# Patient Record
Sex: Male | Born: 1935
Health system: Southern US, Community
[De-identification: ages and names within clinical notes are randomized; demographics above are authoritative.]

## PROBLEM LIST (undated history)

## (undated) DIAGNOSIS — D126 Benign neoplasm of colon, unspecified: Secondary | ICD-10-CM

## (undated) DIAGNOSIS — G4700 Insomnia, unspecified: Secondary | ICD-10-CM

## (undated) DIAGNOSIS — I2510 Atherosclerotic heart disease of native coronary artery without angina pectoris: Secondary | ICD-10-CM

## (undated) DIAGNOSIS — H903 Sensorineural hearing loss, bilateral: Secondary | ICD-10-CM

## (undated) DIAGNOSIS — K5900 Constipation, unspecified: Secondary | ICD-10-CM

## (undated) DIAGNOSIS — I6523 Occlusion and stenosis of bilateral carotid arteries: Secondary | ICD-10-CM

## (undated) DIAGNOSIS — N289 Disorder of kidney and ureter, unspecified: Secondary | ICD-10-CM

## (undated) DIAGNOSIS — N400 Enlarged prostate without lower urinary tract symptoms: Secondary | ICD-10-CM

## (undated) DIAGNOSIS — K5790 Diverticulosis of intestine, part unspecified, without perforation or abscess without bleeding: Secondary | ICD-10-CM

## (undated) DIAGNOSIS — E785 Hyperlipidemia, unspecified: Secondary | ICD-10-CM

## (undated) DIAGNOSIS — Z0000 Encounter for general adult medical examination without abnormal findings: Secondary | ICD-10-CM

## (undated) DIAGNOSIS — I10 Essential (primary) hypertension: Secondary | ICD-10-CM

## (undated) DIAGNOSIS — H409 Unspecified glaucoma: Secondary | ICD-10-CM

## (undated) DIAGNOSIS — I739 Peripheral vascular disease, unspecified: Secondary | ICD-10-CM

## (undated) DIAGNOSIS — M545 Low back pain: Secondary | ICD-10-CM

## (undated) DIAGNOSIS — R51 Headache: Secondary | ICD-10-CM

## (undated) DIAGNOSIS — R413 Other amnesia: Secondary | ICD-10-CM

## (undated) DIAGNOSIS — K222 Esophageal obstruction: Secondary | ICD-10-CM

## (undated) DIAGNOSIS — N189 Chronic kidney disease, unspecified: Secondary | ICD-10-CM

## (undated) DIAGNOSIS — R42 Dizziness and giddiness: Secondary | ICD-10-CM

## (undated) HISTORY — PX: EYE SURGERY: SHX253

## (undated) HISTORY — DX: Constipation, unspecified: K59.00

## (undated) HISTORY — DX: Diverticulosis of intestine, part unspecified, without perforation or abscess without bleeding: K57.90

## (undated) HISTORY — PX: CORONARY ARTERY BYPASS GRAFT: SHX141

## (undated) HISTORY — DX: Benign neoplasm of colon, unspecified: D12.6

## (undated) HISTORY — DX: Other amnesia: R41.3

## (undated) HISTORY — DX: Occlusion and stenosis of bilateral carotid arteries: I65.23

## (undated) HISTORY — DX: Unspecified glaucoma: H40.9

## (undated) HISTORY — DX: Low back pain: M54.5

## (undated) HISTORY — DX: Atherosclerotic heart disease of native coronary artery without angina pectoris: I25.10

## (undated) HISTORY — DX: Peripheral vascular disease, unspecified: I73.9

## (undated) HISTORY — DX: Esophageal obstruction: K22.2

## (undated) HISTORY — DX: Encounter for general adult medical examination without abnormal findings: Z00.00

## (undated) HISTORY — DX: Disorder of kidney and ureter, unspecified: N28.9

## (undated) HISTORY — DX: Essential (primary) hypertension: I10

## (undated) HISTORY — DX: Headache: R51

## (undated) HISTORY — DX: Hyperlipidemia, unspecified: E78.5

## (undated) HISTORY — DX: Chronic kidney disease, unspecified: N18.9

## (undated) HISTORY — DX: Dizziness and giddiness: R42

## (undated) HISTORY — DX: Benign prostatic hyperplasia without lower urinary tract symptoms: N40.0

## (undated) HISTORY — DX: Sensorineural hearing loss, bilateral: H90.3

## (undated) HISTORY — DX: Insomnia, unspecified: G47.00

---

## 1994-11-06 HISTORY — PX: ABDOMINAL AORTIC ANEURYSM REPAIR: SHX42

## 1994-11-06 HISTORY — PX: RENAL ARTERY STENT: SHX2321

## 1998-02-16 ENCOUNTER — Other Ambulatory Visit: Admission: RE | Admit: 1998-02-16 | Discharge: 1998-02-16 | Payer: Self-pay | Admitting: Family Medicine

## 1998-11-15 ENCOUNTER — Ambulatory Visit (HOSPITAL_COMMUNITY): Admission: RE | Admit: 1998-11-15 | Discharge: 1998-11-15 | Payer: Self-pay | Admitting: Family Medicine

## 1999-11-01 ENCOUNTER — Encounter (HOSPITAL_COMMUNITY): Admission: RE | Admit: 1999-11-01 | Discharge: 2000-01-30 | Payer: Self-pay | Admitting: Cardiology

## 2002-10-28 ENCOUNTER — Encounter: Payer: Self-pay | Admitting: Family Medicine

## 2002-10-28 ENCOUNTER — Encounter: Admission: RE | Admit: 2002-10-28 | Discharge: 2002-10-28 | Payer: Self-pay | Admitting: Family Medicine

## 2004-01-08 ENCOUNTER — Emergency Department (HOSPITAL_COMMUNITY): Admission: EM | Admit: 2004-01-08 | Discharge: 2004-01-08 | Payer: Self-pay | Admitting: Emergency Medicine

## 2004-09-26 ENCOUNTER — Encounter: Admission: RE | Admit: 2004-09-26 | Discharge: 2004-09-26 | Payer: Self-pay | Admitting: Family Medicine

## 2005-02-09 ENCOUNTER — Ambulatory Visit: Payer: Self-pay | Admitting: Internal Medicine

## 2005-02-24 ENCOUNTER — Ambulatory Visit: Payer: Self-pay

## 2005-03-21 ENCOUNTER — Encounter: Admission: RE | Admit: 2005-03-21 | Discharge: 2005-03-21 | Payer: Self-pay | Admitting: General Surgery

## 2005-03-23 ENCOUNTER — Ambulatory Visit (HOSPITAL_BASED_OUTPATIENT_CLINIC_OR_DEPARTMENT_OTHER): Admission: RE | Admit: 2005-03-23 | Discharge: 2005-03-23 | Payer: Self-pay | Admitting: General Surgery

## 2005-03-23 ENCOUNTER — Ambulatory Visit (HOSPITAL_COMMUNITY): Admission: RE | Admit: 2005-03-23 | Discharge: 2005-03-23 | Payer: Self-pay | Admitting: General Surgery

## 2005-03-23 HISTORY — PX: HERNIA REPAIR: SHX51

## 2005-05-16 ENCOUNTER — Ambulatory Visit: Payer: Self-pay | Admitting: Cardiology

## 2005-05-29 ENCOUNTER — Ambulatory Visit: Payer: Self-pay

## 2005-05-29 ENCOUNTER — Encounter: Payer: Self-pay | Admitting: Internal Medicine

## 2005-06-12 ENCOUNTER — Encounter: Payer: Self-pay | Admitting: Internal Medicine

## 2005-06-12 ENCOUNTER — Ambulatory Visit: Payer: Self-pay

## 2006-02-12 ENCOUNTER — Ambulatory Visit: Payer: Self-pay

## 2006-02-12 ENCOUNTER — Ambulatory Visit: Payer: Self-pay | Admitting: Cardiology

## 2006-07-25 ENCOUNTER — Ambulatory Visit (HOSPITAL_COMMUNITY): Admission: RE | Admit: 2006-07-25 | Discharge: 2006-07-25 | Payer: Self-pay | Admitting: Family Medicine

## 2006-08-27 ENCOUNTER — Ambulatory Visit: Payer: Self-pay | Admitting: Gastroenterology

## 2006-08-28 ENCOUNTER — Encounter (INDEPENDENT_AMBULATORY_CARE_PROVIDER_SITE_OTHER): Payer: Self-pay | Admitting: *Deleted

## 2006-08-28 ENCOUNTER — Ambulatory Visit: Payer: Self-pay | Admitting: Gastroenterology

## 2007-02-13 ENCOUNTER — Ambulatory Visit: Payer: Self-pay

## 2007-02-13 ENCOUNTER — Encounter: Payer: Self-pay | Admitting: Internal Medicine

## 2007-02-13 ENCOUNTER — Ambulatory Visit: Payer: Self-pay | Admitting: Cardiology

## 2007-02-26 ENCOUNTER — Encounter: Payer: Self-pay | Admitting: Internal Medicine

## 2007-04-23 ENCOUNTER — Encounter: Payer: Self-pay | Admitting: Internal Medicine

## 2007-04-23 ENCOUNTER — Emergency Department (HOSPITAL_COMMUNITY): Admission: EM | Admit: 2007-04-23 | Discharge: 2007-04-23 | Payer: Self-pay | Admitting: Emergency Medicine

## 2007-04-29 ENCOUNTER — Ambulatory Visit: Payer: Self-pay | Admitting: Internal Medicine

## 2007-05-16 ENCOUNTER — Ambulatory Visit: Payer: Self-pay | Admitting: Gastroenterology

## 2007-05-22 ENCOUNTER — Ambulatory Visit (HOSPITAL_COMMUNITY): Admission: RE | Admit: 2007-05-22 | Discharge: 2007-05-22 | Payer: Self-pay | Admitting: Gastroenterology

## 2007-07-19 ENCOUNTER — Ambulatory Visit: Payer: Self-pay | Admitting: Gastroenterology

## 2007-08-08 ENCOUNTER — Ambulatory Visit (HOSPITAL_COMMUNITY): Admission: RE | Admit: 2007-08-08 | Discharge: 2007-08-08 | Payer: Self-pay | Admitting: Gastroenterology

## 2007-08-19 ENCOUNTER — Ambulatory Visit (HOSPITAL_COMMUNITY): Admission: RE | Admit: 2007-08-19 | Discharge: 2007-08-19 | Payer: Self-pay | Admitting: Gastroenterology

## 2007-08-27 ENCOUNTER — Ambulatory Visit: Payer: Self-pay | Admitting: Gastroenterology

## 2007-09-23 ENCOUNTER — Ambulatory Visit: Payer: Self-pay | Admitting: Gastroenterology

## 2007-12-02 ENCOUNTER — Ambulatory Visit: Payer: Self-pay | Admitting: Gastroenterology

## 2007-12-05 ENCOUNTER — Ambulatory Visit (HOSPITAL_COMMUNITY): Admission: RE | Admit: 2007-12-05 | Discharge: 2007-12-05 | Payer: Self-pay | Admitting: Gastroenterology

## 2007-12-10 ENCOUNTER — Ambulatory Visit: Payer: Self-pay | Admitting: Gastroenterology

## 2008-03-02 ENCOUNTER — Encounter: Payer: Self-pay | Admitting: Internal Medicine

## 2008-05-29 ENCOUNTER — Encounter: Payer: Self-pay | Admitting: Internal Medicine

## 2008-05-29 ENCOUNTER — Telehealth: Payer: Self-pay | Admitting: Gastroenterology

## 2008-05-29 ENCOUNTER — Ambulatory Visit (HOSPITAL_COMMUNITY): Admission: RE | Admit: 2008-05-29 | Discharge: 2008-05-29 | Payer: Self-pay | Admitting: Internal Medicine

## 2008-05-29 DIAGNOSIS — K222 Esophageal obstruction: Secondary | ICD-10-CM | POA: Insufficient documentation

## 2008-05-29 DIAGNOSIS — R1319 Other dysphagia: Secondary | ICD-10-CM | POA: Insufficient documentation

## 2008-06-01 ENCOUNTER — Telehealth: Payer: Self-pay | Admitting: Gastroenterology

## 2008-06-02 ENCOUNTER — Ambulatory Visit: Payer: Self-pay | Admitting: Internal Medicine

## 2008-06-12 ENCOUNTER — Encounter: Payer: Self-pay | Admitting: Gastroenterology

## 2008-06-12 ENCOUNTER — Ambulatory Visit (HOSPITAL_COMMUNITY): Admission: RE | Admit: 2008-06-12 | Discharge: 2008-06-12 | Payer: Self-pay | Admitting: Gastroenterology

## 2008-08-11 ENCOUNTER — Encounter: Payer: Self-pay | Admitting: Gastroenterology

## 2008-08-11 ENCOUNTER — Telehealth: Payer: Self-pay | Admitting: Gastroenterology

## 2008-09-15 ENCOUNTER — Telehealth: Payer: Self-pay | Admitting: Gastroenterology

## 2008-09-28 ENCOUNTER — Ambulatory Visit (HOSPITAL_COMMUNITY): Admission: RE | Admit: 2008-09-28 | Discharge: 2008-09-28 | Payer: Self-pay | Admitting: Gastroenterology

## 2008-09-28 ENCOUNTER — Ambulatory Visit: Payer: Self-pay | Admitting: Gastroenterology

## 2008-11-18 ENCOUNTER — Encounter: Payer: Self-pay | Admitting: Internal Medicine

## 2008-12-04 ENCOUNTER — Ambulatory Visit: Payer: Self-pay | Admitting: Internal Medicine

## 2008-12-04 DIAGNOSIS — Z9889 Other specified postprocedural states: Secondary | ICD-10-CM

## 2008-12-04 DIAGNOSIS — N182 Chronic kidney disease, stage 2 (mild): Secondary | ICD-10-CM | POA: Insufficient documentation

## 2008-12-07 DIAGNOSIS — I2510 Atherosclerotic heart disease of native coronary artery without angina pectoris: Secondary | ICD-10-CM

## 2008-12-07 DIAGNOSIS — E782 Mixed hyperlipidemia: Secondary | ICD-10-CM

## 2008-12-07 DIAGNOSIS — I10 Essential (primary) hypertension: Secondary | ICD-10-CM | POA: Insufficient documentation

## 2009-01-06 ENCOUNTER — Telehealth: Payer: Self-pay | Admitting: Gastroenterology

## 2009-01-12 ENCOUNTER — Ambulatory Visit: Payer: Self-pay | Admitting: Gastroenterology

## 2009-01-12 ENCOUNTER — Ambulatory Visit (HOSPITAL_COMMUNITY): Admission: RE | Admit: 2009-01-12 | Discharge: 2009-01-12 | Payer: Self-pay | Admitting: Gastroenterology

## 2009-01-25 ENCOUNTER — Ambulatory Visit: Payer: Self-pay | Admitting: Internal Medicine

## 2009-01-25 LAB — CONVERTED CEMR LAB
ALT: 16 units/L (ref 0–53)
Albumin: 3.9 g/dL (ref 3.5–5.2)
Alkaline Phosphatase: 51 units/L (ref 39–117)
Basophils Absolute: 0.1 10*3/uL (ref 0.0–0.1)
Bilirubin, Direct: 0.2 mg/dL (ref 0.0–0.3)
Calcium: 9.5 mg/dL (ref 8.4–10.5)
Glucose, Bld: 114 mg/dL — ABNORMAL HIGH (ref 70–99)
Hemoglobin: 15 g/dL (ref 13.0–17.0)
LDL Cholesterol: 84 mg/dL (ref 0–99)
MCHC: 34.3 g/dL (ref 30.0–36.0)
MCV: 90.1 fL (ref 78.0–100.0)
Platelets: 222 10*3/uL (ref 150.0–400.0)
RBC: 4.86 M/uL (ref 4.22–5.81)
RDW: 13.5 % (ref 11.5–14.6)
Sodium: 141 meq/L (ref 135–145)
TSH: 2.74 microintl units/mL (ref 0.35–5.50)
Total Bilirubin: 1 mg/dL (ref 0.3–1.2)
Total Protein: 7.7 g/dL (ref 6.0–8.3)

## 2009-02-01 ENCOUNTER — Ambulatory Visit: Payer: Self-pay | Admitting: Internal Medicine

## 2009-03-30 ENCOUNTER — Ambulatory Visit: Payer: Self-pay | Admitting: Internal Medicine

## 2009-03-30 LAB — CONVERTED CEMR LAB
Cholesterol: 146 mg/dL (ref 0–200)
HDL: 37.1 mg/dL — ABNORMAL LOW (ref >39.00)
LDL Cholesterol: 82 mg/dL (ref 0–99)
Triglycerides: 135 mg/dL (ref 0.0–149.0)
VLDL: 27 mg/dL (ref 0.0–40.0)

## 2009-04-06 ENCOUNTER — Ambulatory Visit: Payer: Self-pay | Admitting: Internal Medicine

## 2009-04-06 DIAGNOSIS — R413 Other amnesia: Secondary | ICD-10-CM

## 2009-04-06 DIAGNOSIS — R42 Dizziness and giddiness: Secondary | ICD-10-CM | POA: Insufficient documentation

## 2009-04-15 ENCOUNTER — Ambulatory Visit: Payer: Self-pay

## 2009-04-15 ENCOUNTER — Encounter: Payer: Self-pay | Admitting: Internal Medicine

## 2009-05-11 ENCOUNTER — Ambulatory Visit: Payer: Self-pay | Admitting: Cardiology

## 2009-05-11 DIAGNOSIS — I779 Disorder of arteries and arterioles, unspecified: Secondary | ICD-10-CM | POA: Insufficient documentation

## 2009-05-11 DIAGNOSIS — I739 Peripheral vascular disease, unspecified: Secondary | ICD-10-CM

## 2009-05-18 ENCOUNTER — Telehealth (INDEPENDENT_AMBULATORY_CARE_PROVIDER_SITE_OTHER): Payer: Self-pay | Admitting: Radiology

## 2009-05-19 ENCOUNTER — Ambulatory Visit: Payer: Self-pay

## 2009-05-19 ENCOUNTER — Encounter: Payer: Self-pay | Admitting: Cardiology

## 2009-06-14 ENCOUNTER — Telehealth: Payer: Self-pay | Admitting: Gastroenterology

## 2009-06-15 ENCOUNTER — Encounter: Payer: Self-pay | Admitting: Gastroenterology

## 2009-06-17 ENCOUNTER — Ambulatory Visit (HOSPITAL_COMMUNITY): Admission: RE | Admit: 2009-06-17 | Discharge: 2009-06-17 | Payer: Self-pay | Admitting: Gastroenterology

## 2009-06-24 ENCOUNTER — Ambulatory Visit (HOSPITAL_COMMUNITY): Admission: RE | Admit: 2009-06-24 | Discharge: 2009-06-24 | Payer: Self-pay | Admitting: Gastroenterology

## 2009-06-24 ENCOUNTER — Ambulatory Visit: Payer: Self-pay | Admitting: Gastroenterology

## 2009-07-01 ENCOUNTER — Ambulatory Visit: Payer: Self-pay | Admitting: Internal Medicine

## 2009-07-01 LAB — CONVERTED CEMR LAB
ALT: 17 units/L (ref 0–53)
Albumin: 4.4 g/dL (ref 3.5–5.2)
BUN: 26 mg/dL — ABNORMAL HIGH (ref 6–23)
Bilirubin, Direct: 0.1 mg/dL (ref 0.0–0.3)
Calcium: 9.6 mg/dL (ref 8.4–10.5)
Creatinine, Ser: 1.53 mg/dL — ABNORMAL HIGH (ref 0.40–1.50)
Glucose, Bld: 116 mg/dL — ABNORMAL HIGH (ref 70–99)
Potassium: 4.4 meq/L (ref 3.5–5.3)
Sodium: 139 meq/L (ref 135–145)

## 2009-07-08 ENCOUNTER — Ambulatory Visit: Payer: Self-pay | Admitting: Internal Medicine

## 2009-09-09 ENCOUNTER — Telehealth: Payer: Self-pay | Admitting: Gastroenterology

## 2009-09-14 ENCOUNTER — Encounter: Payer: Self-pay | Admitting: Gastroenterology

## 2009-09-14 ENCOUNTER — Ambulatory Visit: Payer: Self-pay | Admitting: Gastroenterology

## 2009-09-14 ENCOUNTER — Ambulatory Visit (HOSPITAL_COMMUNITY): Admission: RE | Admit: 2009-09-14 | Discharge: 2009-09-14 | Payer: Self-pay | Admitting: Gastroenterology

## 2009-09-16 ENCOUNTER — Encounter: Payer: Self-pay | Admitting: Gastroenterology

## 2009-09-20 ENCOUNTER — Telehealth: Payer: Self-pay | Admitting: Gastroenterology

## 2009-09-21 ENCOUNTER — Ambulatory Visit: Payer: Self-pay | Admitting: Gastroenterology

## 2009-09-21 DIAGNOSIS — Z860100 Personal history of colon polyps, unspecified: Secondary | ICD-10-CM | POA: Insufficient documentation

## 2009-09-21 DIAGNOSIS — R1032 Left lower quadrant pain: Secondary | ICD-10-CM

## 2009-09-22 ENCOUNTER — Ambulatory Visit (HOSPITAL_COMMUNITY): Admission: RE | Admit: 2009-09-22 | Discharge: 2009-09-22 | Payer: Self-pay | Admitting: Gastroenterology

## 2009-09-23 ENCOUNTER — Telehealth: Payer: Self-pay | Admitting: Gastroenterology

## 2009-09-27 ENCOUNTER — Encounter: Payer: Self-pay | Admitting: Internal Medicine

## 2009-09-27 ENCOUNTER — Encounter: Payer: Self-pay | Admitting: Gastroenterology

## 2009-09-27 ENCOUNTER — Ambulatory Visit (HOSPITAL_COMMUNITY): Admission: RE | Admit: 2009-09-27 | Discharge: 2009-09-27 | Payer: Self-pay | Admitting: Gastroenterology

## 2009-11-29 ENCOUNTER — Ambulatory Visit: Payer: Self-pay | Admitting: Gastroenterology

## 2009-11-30 ENCOUNTER — Ambulatory Visit: Payer: Self-pay | Admitting: Internal Medicine

## 2009-11-30 LAB — CONVERTED CEMR LAB
BUN: 36 mg/dL — ABNORMAL HIGH (ref 6–23)
Basophils Relative: 0 % (ref 0–1)
CO2: 24 meq/L (ref 19–32)
Calcium: 9.3 mg/dL (ref 8.4–10.5)
Chloride: 100 meq/L (ref 96–112)
Lymphocytes Relative: 24 % (ref 12–46)
Lymphs Abs: 2.9 10*3/uL (ref 0.7–4.0)
MCHC: 32.4 g/dL (ref 30.0–36.0)
MCV: 90.4 fL (ref 78.0–100.0)
Potassium: 4.5 meq/L (ref 3.5–5.3)
RDW: 13.7 % (ref 11.5–15.5)
WBC: 12.1 10*3/uL — ABNORMAL HIGH (ref 4.0–10.5)

## 2009-12-01 ENCOUNTER — Telehealth: Payer: Self-pay | Admitting: Internal Medicine

## 2009-12-02 ENCOUNTER — Encounter: Payer: Self-pay | Admitting: Physician Assistant

## 2009-12-07 ENCOUNTER — Ambulatory Visit: Payer: Self-pay | Admitting: Internal Medicine

## 2009-12-10 ENCOUNTER — Telehealth: Payer: Self-pay | Admitting: Cardiology

## 2009-12-15 ENCOUNTER — Ambulatory Visit: Payer: Self-pay | Admitting: Internal Medicine

## 2009-12-15 LAB — CONVERTED CEMR LAB
Calcium: 9.8 mg/dL (ref 8.4–10.5)
Chloride: 105 meq/L (ref 96–112)
Creatinine, Ser: 1.43 mg/dL (ref 0.40–1.50)
Potassium: 4.8 meq/L (ref 3.5–5.3)

## 2009-12-16 ENCOUNTER — Encounter: Payer: Self-pay | Admitting: Internal Medicine

## 2009-12-17 ENCOUNTER — Ambulatory Visit: Payer: Self-pay

## 2009-12-17 ENCOUNTER — Encounter: Payer: Self-pay | Admitting: Cardiology

## 2010-01-05 ENCOUNTER — Ambulatory Visit: Payer: Self-pay | Admitting: Internal Medicine

## 2010-01-10 ENCOUNTER — Telehealth: Payer: Self-pay | Admitting: Gastroenterology

## 2010-01-11 DIAGNOSIS — K921 Melena: Secondary | ICD-10-CM

## 2010-01-18 ENCOUNTER — Encounter: Payer: Self-pay | Admitting: Internal Medicine

## 2010-01-18 ENCOUNTER — Ambulatory Visit (HOSPITAL_COMMUNITY): Admission: RE | Admit: 2010-01-18 | Discharge: 2010-01-18 | Payer: Self-pay | Admitting: Gastroenterology

## 2010-01-18 ENCOUNTER — Ambulatory Visit: Payer: Self-pay | Admitting: Gastroenterology

## 2010-03-09 ENCOUNTER — Telehealth: Payer: Self-pay | Admitting: Internal Medicine

## 2010-03-18 ENCOUNTER — Telehealth: Payer: Self-pay | Admitting: Internal Medicine

## 2010-03-24 ENCOUNTER — Telehealth: Payer: Self-pay | Admitting: Internal Medicine

## 2010-04-07 ENCOUNTER — Ambulatory Visit: Payer: Self-pay | Admitting: Cardiology

## 2010-04-18 ENCOUNTER — Telehealth: Payer: Self-pay | Admitting: Internal Medicine

## 2010-04-28 ENCOUNTER — Telehealth: Payer: Self-pay | Admitting: Internal Medicine

## 2010-06-14 ENCOUNTER — Encounter: Payer: Self-pay | Admitting: Internal Medicine

## 2010-06-14 ENCOUNTER — Encounter: Payer: Self-pay | Admitting: Cardiology

## 2010-06-14 LAB — CONVERTED CEMR LAB
ALT: 12 units/L (ref 0–53)
AST: 19 units/L (ref 0–37)
BUN: 22 mg/dL (ref 6–23)
Bilirubin, Direct: 0.1 mg/dL (ref 0.0–0.3)
CO2: 25 meq/L (ref 19–32)
Glucose, Bld: 110 mg/dL — ABNORMAL HIGH (ref 70–99)
Indirect Bilirubin: 0.5 mg/dL (ref 0.0–0.9)
LDL Cholesterol: 85 mg/dL (ref 0–99)
Total CHOL/HDL Ratio: 4.2
Total Protein: 7.4 g/dL (ref 6.0–8.3)

## 2010-06-15 ENCOUNTER — Encounter: Payer: Self-pay | Admitting: Cardiovascular Disease

## 2010-06-15 ENCOUNTER — Ambulatory Visit: Payer: Self-pay

## 2010-06-21 ENCOUNTER — Ambulatory Visit: Payer: Self-pay | Admitting: Internal Medicine

## 2010-07-04 ENCOUNTER — Ambulatory Visit: Payer: Self-pay | Admitting: Internal Medicine

## 2010-07-04 DIAGNOSIS — N410 Acute prostatitis: Secondary | ICD-10-CM | POA: Insufficient documentation

## 2010-07-04 LAB — CONVERTED CEMR LAB
Bilirubin Urine: NEGATIVE
Ketones, urine, test strip: NEGATIVE
Nitrite: NEGATIVE

## 2010-07-25 ENCOUNTER — Encounter: Payer: Self-pay | Admitting: Internal Medicine

## 2010-09-13 ENCOUNTER — Telehealth: Payer: Self-pay | Admitting: Gastroenterology

## 2010-09-14 ENCOUNTER — Telehealth: Payer: Self-pay | Admitting: Gastroenterology

## 2010-09-15 ENCOUNTER — Encounter: Payer: Self-pay | Admitting: Internal Medicine

## 2010-09-15 ENCOUNTER — Ambulatory Visit: Payer: Self-pay | Admitting: Gastroenterology

## 2010-09-15 ENCOUNTER — Ambulatory Visit (HOSPITAL_COMMUNITY): Admission: RE | Admit: 2010-09-15 | Discharge: 2010-09-15 | Payer: Self-pay | Admitting: Gastroenterology

## 2010-09-15 ENCOUNTER — Telehealth: Payer: Self-pay | Admitting: Internal Medicine

## 2010-09-15 LAB — CONVERTED CEMR LAB
PSA: 1.21 ng/mL (ref <=4.00)
Potassium: 4.8 meq/L (ref 3.5–5.3)
Sodium: 140 meq/L (ref 135–145)

## 2010-09-22 ENCOUNTER — Ambulatory Visit: Payer: Self-pay | Admitting: Internal Medicine

## 2010-09-22 DIAGNOSIS — L0233 Carbuncle of buttock: Secondary | ICD-10-CM

## 2010-10-27 ENCOUNTER — Observation Stay (HOSPITAL_COMMUNITY)
Admission: AD | Admit: 2010-10-27 | Discharge: 2010-10-29 | Payer: Self-pay | Source: Home / Self Care | Attending: Internal Medicine | Admitting: Internal Medicine

## 2010-10-27 ENCOUNTER — Emergency Department (HOSPITAL_BASED_OUTPATIENT_CLINIC_OR_DEPARTMENT_OTHER)
Admission: EM | Admit: 2010-10-27 | Discharge: 2010-10-27 | Disposition: A | Discharge status: Other Acute Inpatient Hospital | Payer: Self-pay | Source: Home / Self Care | Admitting: Emergency Medicine

## 2010-10-27 ENCOUNTER — Telehealth: Payer: Self-pay | Admitting: Internal Medicine

## 2010-10-28 ENCOUNTER — Encounter (INDEPENDENT_AMBULATORY_CARE_PROVIDER_SITE_OTHER): Payer: Self-pay | Admitting: Internal Medicine

## 2010-10-28 ENCOUNTER — Telehealth (INDEPENDENT_AMBULATORY_CARE_PROVIDER_SITE_OTHER): Payer: Self-pay | Admitting: *Deleted

## 2010-11-01 ENCOUNTER — Telehealth: Payer: Self-pay | Admitting: Internal Medicine

## 2010-11-03 ENCOUNTER — Encounter
Admission: RE | Admit: 2010-11-03 | Discharge: 2010-11-03 | Payer: Self-pay | Source: Home / Self Care | Attending: Neurology | Admitting: Neurology

## 2010-11-09 ENCOUNTER — Encounter
Admission: RE | Admit: 2010-11-09 | Discharge: 2010-11-23 | Payer: Self-pay | Source: Home / Self Care | Attending: Neurology | Admitting: Neurology

## 2010-11-14 ENCOUNTER — Encounter: Admit: 2010-11-14 | Payer: Self-pay | Admitting: Neurology

## 2010-11-16 ENCOUNTER — Encounter: Admit: 2010-11-16 | Payer: Self-pay | Admitting: Neurology

## 2010-11-17 ENCOUNTER — Encounter: Payer: Self-pay | Admitting: Internal Medicine

## 2010-11-17 ENCOUNTER — Ambulatory Visit
Admission: RE | Admit: 2010-11-17 | Discharge: 2010-11-17 | Payer: Self-pay | Source: Home / Self Care | Attending: Internal Medicine | Admitting: Internal Medicine

## 2010-11-17 DIAGNOSIS — R279 Unspecified lack of coordination: Secondary | ICD-10-CM | POA: Insufficient documentation

## 2010-11-17 DIAGNOSIS — R739 Hyperglycemia, unspecified: Secondary | ICD-10-CM | POA: Insufficient documentation

## 2010-11-17 LAB — CONVERTED CEMR LAB
CO2: 22 meq/L (ref 19–32)
Chloride: 102 meq/L (ref 96–112)
Glucose, Bld: 106 mg/dL — ABNORMAL HIGH (ref 70–99)
MCV: 89.6 fL (ref 78.0–100.0)
Potassium: 4.4 meq/L (ref 3.5–5.3)
RBC: 5.12 M/uL (ref 4.22–5.81)
Sodium: 135 meq/L (ref 135–145)
WBC: 10.4 10*3/uL (ref 4.0–10.5)

## 2010-11-18 ENCOUNTER — Encounter: Payer: Self-pay | Admitting: Internal Medicine

## 2010-11-23 ENCOUNTER — Encounter: Admit: 2010-11-23 | Payer: Self-pay | Admitting: Neurology

## 2010-11-29 ENCOUNTER — Telehealth: Payer: Self-pay | Admitting: Internal Medicine

## 2010-12-06 NOTE — Assessment & Plan Note (Signed)
Summary: rov in june pt having carotid doppler at 10am  Medications Added BENICAR 20 MG TABS (OLMESARTAN MEDOXOMIL) 1/2 tab once daily      Allergies Added: NKDA  Visit Type:  rov Primary Provider:  [name] DO  CC:  pt states he had some dizziness/nausea/headache one day this week...no other complaints today.  History of Present Illness: Troy Ray comes in today for followup and evaluation management of his coronary artery disease and carotid artery disease.  He's having no symptoms of angina or ischemia. He denies orthopnea, PND or edema. He denies palpitations or syncope.  He denies also any symptoms of TIAs or mini strokes when questioned carefully.  Recent carotid Doppler showed no significant change. A stress Myoview in July of 2000 and showed relatively good exercise tolerance for a man his age, no ischemia or scar, EF 69%, maximum heart rate only 78% of maximum predicted. We are treating him medically.  Current Medications (verified): 1)  Toprol Xl 25 Mg Xr24h-Tab (Metoprolol Succinate) .... Take 1 Tablet By Mouth Once A Day 2)  Crestor 20 Mg Tabs (Rosuvastatin Calcium) .... Take 1 Tablet By Mouth Once A Day 3)  Nexium 40 Mg Cpdr (Esomeprazole Magnesium) .... Take 1 Tablet By Mouth Once A Day 4)  Aspirin Ec Low Dose 81 Mg Tbec (Aspirin) .... Take 1 Tablet By Mouth Once A Day 5)  Benicar 20 Mg Tabs (Olmesartan Medoxomil) .... 1/2 Tab Once Daily  Allergies (verified): No Known Drug Allergies  Past History:  Past Medical History: Last updated: 01/05/2010 CORONARY ARTERY DISEASE (ICD-414.00)status post 3-vessel coronary artery bypass grafting in 2000.  Normal LVF ( Dr. [name]) HYPERTENSION (ICD-401.9)  HYPERLIPIDEMIA (ICD-272.4)  FAMILY HISTORY OF CAD MALE 1ST DEGREE RELATIVE <50 (ICD-V17.3) UNSPECIFIED PERIPHERAL VASCULAR DISEASE (ICD-443.9) POSTURAL LIGHTHEADEDNESS (ICD-780.4) MEMORY LOSS (ICD-780.93) CHRONIC KIDNEY DISEASE STAGE II (MILD) (ICD-585.2) OTHER  DYSPHAGIA (ICD-787.29)  ESOPHAGEAL STRICTURE (ICD-530.3) CRI - Cr 1.57  03/03/08 Moderate to Severe sensorineural loss bilaterally   Hx of AAA Repair and bilateral renal artery stenting 1996 (Dr  [name]) carotid stenosis -right internal carotid, 60% to 79% stenosed.   left internal carotid artery at 40% to 59%.   Hx of recurrent esophageal stricture (Dr. [name]) Adenomatous Colon Polyps  Diverticulosis  Past Surgical History: Last updated: 01/05/2010  Repair of left inguinal hernia with mesh Armanda Heritage repair). SURGEON:  Angelia Mould. [name], M.D. 03/23/2005      Family History: Last updated: 01/05/2010 Family History of CAD Male 1st degree relative <50  (Father died 67) Diabetes - no     No FH of Colon Cancer:     Social History: Last updated: 01/05/2010 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 3 sons   Alcohol use-yes Former smoker       Risk Factors: Smoking Status: quit (11/30/2009)  Review of Systems       negative other than history of present illness  Vital Signs:  Patient profile:   77 year old male Height:      70 inches Weight:      185 pounds BMI:     26.64 Pulse rate:   62 / minute Pulse rhythm:   regular BP sitting:   136 / 80  (left arm) Cuff size:   large  Vitals Entered By: Danielle Rankin, CMA (April 07, 2010 10:54 AM)  Physical Exam  General:  pleasant, hard of hearing, no acute distress Head:  normocephalic and atraumatic wears glasses Eyes:  PERRLA/EOM intact; conjunctiva  and lids normal. Neck:  Neck supple, no JVD. No masses, thyromegaly or abnormal cervical nodes. Chest Troy Ray:  no deformities or breast masses noted Lungs:  Clear bilaterally to auscultation and percussion. Heart:  PMI nondisplaced, normal S1-S2, loud carotid bruits Abdomen:  well-healed incision Msk:  decreased ROM.  decreased ROM.   Pulses:  pulses normal in all 4 extremities Extremities:  No clubbing or cyanosis. Neurologic:  Alert and oriented x  3. Skin:  Intact without lesions or rashes. Psych:  Normal affect.   Impression & Recommendations:  Problem # 1:  CORONARY ARTERY DISEASE (ICD-414.00)  His updated medication list for this problem includes:    Toprol Xl 25 Mg Xr24h-tab (Metoprolol succinate) .[name] Kitchen... Take 1 tablet by mouth once a day    Aspirin Ec Low Dose 81 Mg Tbec (Aspirin) .[name] Kitchen... Take 1 tablet by mouth once a day  Problem # 2:  CAROTID ARTERY STENOSIS, WITHOUT INFARCTION (ICD-433.10) Assessment: Unchanged Will repeat carotid Dopplers in February of 2012. Symptoms of possible stroke or TIA reviewed with the patient and his wife. How to respond including calling 911 reviewed. His updated medication list for this problem includes:    Aspirin Ec Low Dose 81 Mg Tbec (Aspirin) .[name] Kitchen... Take 1 tablet by mouth once a day  Problem # 3:  HYPERTENSION (ICD-401.9)  His updated medication list for this problem includes:    Toprol Xl 25 Mg Xr24h-tab (Metoprolol succinate) .[name] Kitchen... Take 1 tablet by mouth once a day    Aspirin Ec Low Dose 81 Mg Tbec (Aspirin) .[name] Kitchen... Take 1 tablet by mouth once a day    Benicar 20 Mg Tabs (Olmesartan medoxomil) .[name] Kitchen... 1/2 tab once daily  Problem # 4:  HYPERLIPIDEMIA (ICD-272.4)  His updated medication list for this problem includes:    Crestor 20 Mg Tabs (Rosuvastatin calcium) .[name] Kitchen... Take 1 tablet by mouth once a day  Problem # 5:  UNSPECIFIED PERIPHERAL VASCULAR DISEASE (ICD-443.9) Assessment: Unchanged he is status post abdominal aortic aneurysm repair and bilateral renal artery grafting. This is stable. He is having no abdominal pain and no claudication.  Patient Instructions: 1)  Your physician recommends that you schedule a follow-up appointment in: 6 MONTHS WITH DR Elwanda Moger 2)  Your physician recommends that you continue on your current medications as directed. Please refer to the Current Medication list given to you today. 3)  Your physician has requested that you have a carotid duplex. This test is  an ultrasound of the carotid arteries in your neck. It looks at blood flow through these arteries that supply the brain with blood. Allow one hour for this exam. There are no restrictions or special instructions.RECHECK CAROTID IN 2/12

## 2010-12-06 NOTE — Progress Notes (Signed)
Summary: Benicar Refill  Phone Note Refill Request Message from:  Fax from Pharmacy on Mar 09, 2010 4:06 PM  Refills Requested: Medication #1:  BENICAR 20 MG TABS 1 by mouth once daily.   Brand Name Necessary? No   Supply Requested: 3 months  Method Requested: Electronic Next Appointment Scheduled: 06/15/2010 @ 10am  Initial call taken by: Glendell Docker CMA,  Mar 09, 2010 4:07 PM  Follow-up for Phone Call        ok to refill x 5 Follow-up by: D. Thomos Lemons DO,  Mar 10, 2010 10:19 AM  Additional Follow-up for Phone Call Additional follow up Details #1::        Rx sent electronically to pharmacy Additional Follow-up by: Glendell Docker CMA,  Mar 10, 2010 3:34 PM    Prescriptions: BENICAR 20 MG TABS (OLMESARTAN MEDOXOMIL) 1 by mouth once daily  #30 x 5   Entered by:   Glendell Docker CMA   Authorized by:   D. Thomos Lemons DO   Signed by:   Glendell Docker CMA on 03/10/2010   Method used:   [facility] to        [facility] [phone]* (retail)       [address]       [place], [place]  [address]       Ph: [phone]       Fax: [phone]   RxID:   [id number]

## 2010-12-06 NOTE — Progress Notes (Signed)
Summary: Benicar qty change  Phone Note Refill Request Message from:  Fax from Pharmacy on Mar 18, 2010 11:24 AM  Refills Requested: Medication #1:  BENICAR 20 MG TABS 1 by mouth once daily.   Dosage confirmed as above?Dosage Confirmed   Brand Name Necessary? No   Supply Requested: 3 months Fax from pharmacy for 90 day supply requested   Method Requested: Electronic Initial call taken by: Glendell Docker CMA,  Mar 18, 2010 11:25 AM    Prescriptions: BENICAR 20 MG TABS (OLMESARTAN MEDOXOMIL) 1 by mouth once daily  #90 x 1   Entered by:   Glendell Docker CMA   Authorized by:   D. Thomos Lemons DO   Signed by:   Glendell Docker CMA on 03/18/2010   Method used:   [facility] to        [facility] [phone]* (retail)       [address]       [place], [place]  [address]       Ph: [phone]       Fax: [phone]   RxID:   [id number]

## 2010-12-06 NOTE — Assessment & Plan Note (Signed)
Summary: DIZZY/MHF   Vital Signs:  Patient profile:   77 year old male Weight:      182 pounds BMI:     26.21 O2 Sat:      100 % on Room air Temp:     98.0 degrees F oral Pulse rate:   63 / minute Pulse rhythm:   regular Resp:     20 per minute BP sitting:   100 / 56  (right arm) Cuff size:   large  Vitals Entered By: Glendell Docker CMA (November 30, 2009 3:01 PM)  O2 Flow:  Room air  Primary Care Provider:  D. Thomos Lemons DO  CC:  Sudden onset of dizziness.  History of Present Illness: 77 y/o white male with hx of postural lightheadedness experienced sudden onset dizziness this AM while he was working at [employer].  His symptoms are usually associated with going from sitting to standing but today his symptoms occurred while he was already standing.   his previous symptoms also usually lasts few seconds but today's episode lasted 10-15 mins.  no chest pain .  no palpitations.  he was in usual state of health.  ate good breakfast this morning.     Preventive Screening-Counseling & Management  Alcohol-Tobacco     Smoking Status: quit  Allergies (verified): No Known Drug Allergies  Past History:  Past Medical History: CORONARY ARTERY DISEASE (ICD-414.00)status post 3-vessel coronary artery bypass grafting in 2000.  Normal LVF ( Dr. [name]) HYPERTENSION (ICD-401.9) HYPERLIPIDEMIA (ICD-272.4)  FAMILY HISTORY OF CAD MALE 1ST DEGREE RELATIVE <50 (ICD-V17.3) UNSPECIFIED PERIPHERAL VASCULAR DISEASE (ICD-443.9) POSTURAL LIGHTHEADEDNESS (ICD-780.4) MEMORY LOSS (ICD-780.93) CHRONIC KIDNEY DISEASE STAGE II (MILD) (ICD-585.2) OTHER DYSPHAGIA (ICD-787.29)  ESOPHAGEAL STRICTURE (ICD-530.3) CRI - Cr 1.57  03/03/08 Moderate to Severe sensorineural loss bilaterally   Hx of AAA Repair and bilateral renal artery stenting 1996 (Dr  [name]) carotid stenosis -right internal carotid, 60% to 79% stenosed.   left internal carotid artery at 40% to 59%.   Hx of recurrent esophageal stricture (Dr.  [name]) Adenomatous Colon Polyps  Diverticulosis  Past Surgical History:  Repair of left inguinal hernia with mesh Armanda Heritage repair). SURGEON:  Angelia Mould. [name], M.D. 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         no nausea or vomiting  Physical Exam  General:  alert, well-developed, and well-nourished.   Eyes:  pupils equal, pupils round, and pupils reactive to light.   Mouth:  pharynx pink and moist.   Lungs:  normal respiratory effort and normal breath sounds.   Heart:  normal rate, regular rhythm, and no gallop.   Abdomen:  soft, non-tender, and normal bowel sounds.   Extremities:  No lower extremity edema  Neurologic:  cranial nerves II-XII intact and gait normal.   Psych:  normally interactive and good eye contact.     Impression & Recommendations:  Problem # 1:  POSTURAL LIGHTHEADEDNESS (ICD-780.4) 77 with pre syncope.  His symptoms lasted 10-15 mins which is unlike his prev episodes.  arrange f/u with cardiology Orders: EKG w/ Interpretation (93000) T-Basic  Metabolic Panel [phone]) T-CBC w/Diff [phone]) Cardiology Referral (Cardiology)  EKG shows - NSR at 61 bpm.    Problem # 2:  HYPERTENSION (ICD-401.9) BP low normal.  DC amlodipine.  The following medications were removed from the medication list:    Amlodipine Besylate 2.5 Mg Tabs (Amlodipine besylate) ..... One by mouth qd His updated medication list for this problem includes:    Toprol Xl 25 Mg Xr24h-tab (Metoprolol succinate) .[name] Kitchen... Take 1 tablet by mouth once a day    Benicar Hct 40-25 Mg Tabs (Olmesartan  medoxomil-hctz) ..... One by mouth qd  BP today: 100/56 Prior BP: 134/72 (11/29/2009)  Labs Reviewed: K+: 4.4 (07/01/2009) Creat: : 1.53 (07/01/2009)   Chol: 146 (03/30/2009)   HDL: 37.10 (03/30/2009)   LDL: 82 (03/30/2009)   TG: 135.0 (03/30/2009)  Complete Medication List: 1)  Toprol Xl 25 Mg Xr24h-tab (Metoprolol succinate) .... Take 1 tablet by mouth once a day 2)  Benicar Hct 40-25 Mg Tabs (Olmesartan medoxomil-hctz) .... One by mouth qd 3)  Crestor 20 Mg Tabs (Rosuvastatin calcium) .... Take 1 tablet by mouth once a day 4)  Nexium 40 Mg Cpdr (Esomeprazole magnesium) .... Take 1 tablet by mouth once a day 5)  Aspirin Ec Low Dose 81 Mg Tbec (Aspirin) .... Take 1 tablet by mouth once a day  Patient Instructions: 1)  Put on compression first thing in the morning. 2)  Follow up with Dr. [name] - our office will contact you re: appointment 3)  Call our office if your symptoms do not  improve or gets worse. 4)  Please schedule a follow-up appointment in 6 weeks.  Current Allergies (reviewed today): No known allergies    Immunization History:  Influenza Immunization History:    Influenza:  historical (09/20/2009)

## 2010-12-06 NOTE — Progress Notes (Signed)
Summary: Triage   Phone Note Other Incoming Call back at Home Phone [phone]   Caller: Patient's Wife Details for Reason: Triage Summary of Call: Pt's wife, Truddie Hidden, called stating pt was having difficulty swallowing and needed to be seen. Initial call taken by: Schuyler Amor,  September 13, 2010 8:54 AM  Follow-up for Phone Call        Patient's wife states patient c/o dysphagia.  Getting worse.  He is advised to avoid bread and all meat he eats needs to be cut up very fine and take small bites.  Patient  is advised to remain on Nexium.  He is scheduled to come in and see Willette Cluster RNP on 09/15/10 at 10:00 Follow-up by: Darcey Nora RN, CGRN,  September 13, 2010 9:13 AM

## 2010-12-06 NOTE — Progress Notes (Signed)
Summary: Pt's here in wait room   Phone Note Call from Patient   Caller: They are here out in wait room Call For: Dr [name] Summary of Call: Thought they were scheduled todya with Gunnar Fusi by error. But see they dont understand why the need to see Gunnar Fusi. want to just be schedule for the procedure across the street. already know how to prep and everything- have done it many times. Initial call taken by: Leanor Kail [place],  September 14, 2010 10:06 AM  Follow-up for Phone Call        Patient wants a direct EGD with Dil asap.  He has an appointment tomorrow with Willette Cluster RNP but they want to skip appointment and have direct procedure.  Dr [name] you are out after today and not have the next EGD possibility at the hospital until Dec 1st.  patient doesn't think he can wait that long.  Please advise Follow-up by: Darcey Nora RN, CGRN,  September 14, 2010 10:17 AM  Additional Follow-up for Phone Call Additional follow up Details #1::        ok to schedule EGD with balloon dilation, whenever it can be squeezed in (schedule for 12:30 tomorrow and reschedule Rennis Chris) Additional Follow-up by: Louis Meckel MD,  September 14, 2010 4:39 PM    Additional Follow-up for Phone Call Additional follow up Details #2::    REV with Willette Cluster RNP canceled.  EGD with dil scheduled at [facility] for 09/15/10 12:30.  They verblaized understanding of instructions. Follow-up by: Darcey Nora RN, CGRN,  September 14, 2010 4:55 PM

## 2010-12-06 NOTE — Procedures (Signed)
Summary: Upper Endoscopy w/DIL  Patient: Troy Ray Note: All result statuses are Final unless otherwise noted.  Tests: (1) Upper Endoscopy w/DIL (UED)  UED Upper Endoscopy w/DIL                             DONE     [facility]     [address] [place], [place]  [address]           ENDOSCOPY PROCEDURE REPORT           PATIENT:  Troy Ray, Troy Ray  MR#:  [id number]     BIRTHDATE:  December 01, 1927, 81 yrs. old  GENDER:  male           ENDOSCOPIST:  Barbette Hair. [name], MD     ASSISTANT:           PROCEDURE DATE:  01/18/2010     PROCEDURE:  EGD with balloon dilatation     ASA CLASS:  Class II     INDICATIONS:  1) dysphagia           MEDICATIONS:   Fentanyl 50 mcg, Versed 5 mg, glycopyrrolate     (Robinal) 0.2 mg IV     TOPICAL ANESTHETIC:  Cetacaine Spray           DESCRIPTION OF PROCEDURE:   After the risks benefits and     alternatives of the procedure were thoroughly explained, informed     consent was obtained.  The EG-[id number] ([id number]) endoscope was     introduced through the mouth and advanced to the third portion of     the duodenum, without limitations.  The instrument was slowly     withdrawn as the mucosa was carefully examined.     <<PROCEDUREIMAGES>>           A stricture was found at the gastroesophageal junction (see     image001). Moderate stricture  A hiatal hernia was found. Large,     sliding hiatal hernia measuring 6-7cm  other findings.    Dilation     was then performed at the gastroesphageal junction           1) Dilator:  Balloon  Size(s):  15-16.5-18     Resistance:  moderate  Heme:  yes     Appearance:     Small amount of heme           COMPLICATIONS:  None           ENDOSCOPIC IMPRESSION:     1) Stricture at the gastroesophageal junction - s/p balloon     dilitation     2) Hiatal hernia     3) Other findings     RECOMMENDATIONS:     1) dilatations PRN     2) continue PPI - nexium           REPEAT EXAM:  No        ______________________________     Barbette Hair. [name], MD           CC:  Thomos Lemons, DO           n.     eSIGNED:   Barbette Hair. Kaplan at 01/18/2010 01:04 PM           Troy Ray, [id number]  Note: An exclamation mark (!) indicates a result that was not dispersed into the flowsheet. Document Creation Date: 01/18/2010 1:04 PM _______________________________________________________________________  [name] Kitchen  1) Order result status: Final Collection or observation date-time: 01/18/2010 13:00 Requested date-time:  Receipt date-time:  Reported date-time:  Referring Physician:   Ordering Physician: Melvia Heaps [phone]) Specimen Source:  Source: Launa Grill Order Number: [id number] Lab site:

## 2010-12-06 NOTE — Medication Information (Signed)
Summary: Nonadherence with Amlodipine/[facility]  Nonadherence with Amlodipine/[facility]   Imported By: Lanelle Bal 01/24/2010 10:20:43  _____________________________________________________________________  External Attachment:    Type:   Image     Comment:   External Document

## 2010-12-06 NOTE — Progress Notes (Signed)
Summary: Crestor Refill  Phone Note Refill Request Message from:  Fax from Pharmacy on Mar 24, 2010 2:03 PM  Refills Requested: Medication #1:  CRESTOR 20 MG TABS Take 1 tablet by mouth once a day   Brand Name Necessary? No   Supply Requested: 3 months   Last Refilled: 12/27/2009  Method Requested: Electronic Next Appointment Scheduled: 06/25/2010 @ 10 a Initial call taken by: Glendell Docker CMA,  Mar 24, 2010 2:03 PM  Follow-up for Phone Call        Rx completed in Dr. Tiajuana Amass Follow-up by: Glendell Docker CMA,  Mar 24, 2010 2:03 PM    Prescriptions: CRESTOR 20 MG TABS (ROSUVASTATIN CALCIUM) Take 1 tablet by mouth once a day  #90 x 1   Entered by:   Glendell Docker CMA   Authorized by:   D. Thomos Lemons DO   Signed by:   Glendell Docker CMA on 03/24/2010   Method used:   Electronically to        [facility] [phone]* (retail)       [address] [place] [address]       [place], [place]  [address]       Ph: [phone]       Fax: [phone]   RxID:   [id number]

## 2010-12-06 NOTE — Miscellaneous (Signed)
  Clinical Lists Changes  Observations: Added new observation of NUCLEAR NOS: Exercise Capacity: Fair exercise capacity. BP Response: Normal blood pressure response. Clinical Symptoms: No chest pain ECG Impression: No significant ST segment change suggestive of ischemia. Overall Impression Comments: No scar. There is no ischemia at the level of stress attained. Nondiagnostic study with heart rate only 78% max. He had fatigue.   (05/19/2009 14:31)      Nuclear Study  Procedure date:  05/19/2009  Findings:      Exercise Capacity: Fair exercise capacity. BP Response: Normal blood pressure response. Clinical Symptoms: No chest pain ECG Impression: No significant ST segment change suggestive of ischemia. Overall Impression Comments: No scar. There is no ischemia at the level of stress attained. Nondiagnostic study with heart rate only 78% max. He had fatigue.

## 2010-12-06 NOTE — Assessment & Plan Note (Signed)
Summary: 1 mo f/u...as.    History of Present Illness Visit Type: Follow-up Visit Primary GI MD: Melvia Heaps MD [name] Primary Provider: [name] DO Chief Complaint: f/u after EGD. Pt denies any GI sx. Pt states he can swallow food much better now. History of Present Illness:   [name] has returned for followup for followup of his dysphagia.  He has a distal esophageal stricture which was last dilated on September 27, 2009.  Since that time he has felt well has had no recurrent GI complaints.  He has taken Nexium intermittently in the past but is now taking it regularly.   GI Review of Systems      Denies abdominal pain, acid reflux, belching, bloating, chest pain, dysphagia with liquids, dysphagia with solids, heartburn, loss of appetite, nausea, vomiting, vomiting blood, weight loss, and  weight gain.        Denies anal fissure, black tarry stools, change in bowel habit, constipation, diarrhea, diverticulosis, fecal incontinence, heme positive stool, hemorrhoids, irritable bowel syndrome, jaundice, light color stool, liver problems, rectal bleeding, and  rectal pain.    Current Medications (verified): 1)  Toprol Xl 25 Mg Xr24h-Tab (Metoprolol Succinate) .... Take 1 Tablet By Mouth Once A Day 2)  Benicar Hct 40-25 Mg Tabs (Olmesartan Medoxomil-Hctz) .... One By Mouth Qd 3)  Crestor 20 Mg Tabs (Rosuvastatin Calcium) .... Take 1 Tablet By Mouth Once A Day 4)  Nexium 40 Mg Cpdr (Esomeprazole Magnesium) .... Take 1 Tablet By Mouth Once A Day 5)  Aspirin Ec Low Dose 81 Mg Tbec (Aspirin) .... Take 1 Tablet By Mouth Once A Day 6)  Amlodipine Besylate 2.5 Mg Tabs (Amlodipine Besylate) .... One By Mouth Qd 7)  Hyomax-Sl 0.125 Mg Subl (Hyoscyamine Sulfate) .... Take 2 Tablets Sublingual Q.4 H. P.r.n. Abdominal Pain  Allergies (verified): No Known Drug Allergies  Past History:  Past Medical History: Reviewed history from 07/08/2009 and no changes required. CORONARY ARTERY DISEASE  (ICD-414.00)status post 3-vessel coronary artery bypass grafting in 2000.  Normal LVF ( Dr. [name]) HYPERTENSION (ICD-401.9) HYPERLIPIDEMIA (ICD-272.4) FAMILY HISTORY OF CAD MALE 1ST DEGREE RELATIVE <50 (ICD-V17.3) UNSPECIFIED PERIPHERAL VASCULAR DISEASE (ICD-443.9) POSTURAL LIGHTHEADEDNESS (ICD-780.4) MEMORY LOSS (ICD-780.93) CHRONIC KIDNEY DISEASE STAGE II (MILD) (ICD-585.2) OTHER DYSPHAGIA (ICD-787.29)  ESOPHAGEAL STRICTURE (ICD-530.3) CRI - Cr 1.57  03/03/08 Moderate to Severe sensorineural loss bilaterally   Hx of AAA Repair and bilateral renal artery stenting 1996 (Dr  [name]) carotid stenosis -right internal carotid, 60% to 79% stenosed.   left internal carotid artery at 40% to 59%.   Hx of recurrent esophageal stricture (Dr. [name]) Adenomatous Colon Polyps Diverticulosis  Past Surgical History: Reviewed history from 07/08/2009 and no changes required.  Repair of left inguinal hernia with mesh Armanda Heritage repair). SURGEON:  Angelia Mould. [name], M.D. 03/23/2005    Family History: Reviewed history from 09/21/2009 and no changes required. Family History of CAD Male 1st degree relative <50  (Father died 74) Diabetes - no     No FH of Colon Cancer:  Social History: Reviewed history from 07/08/2009 and no changes required. 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 3 sons   Alcohol use-yes Former smoker   Review of Systems  The patient denies allergy/sinus, anemia, anxiety-new, arthritis/joint pain, back pain, blood in urine, breast changes/lumps, change in vision, confusion, cough, coughing up blood, depression-new, fainting, fatigue, fever, headaches-new, hearing problems, heart murmur, heart rhythm changes, itching, menstrual pain, muscle pains/cramps, night sweats, nosebleeds, pregnancy symptoms, shortness of breath,  skin rash, sleeping problems, sore throat, swelling of feet/legs, swollen lymph glands, thirst - excessive , urination - excessive ,  urination changes/pain, urine leakage, vision changes, and voice change.    Vital Signs:  Patient profile:   77 year old male Height:      70 inches Weight:      181.13 pounds BMI:     26.08 Pulse rate:   64 / minute Pulse rhythm:   regular BP sitting:   134 / 72  (left arm) Cuff size:   regular  Vitals Entered By: Christie Nottingham CMA Duncan Dull) (November 29, 2009 11:32 AM)   Impression & Recommendations:  Problem # 1:  ESOPHAGEAL STRICTURE (ICD-530.3) Assessment Improved Plan to continue Nexium and repeat dilatation p.r.n.

## 2010-12-06 NOTE — Assessment & Plan Note (Signed)
Summary: 6 month follow up/mhf rsch from bmp/dt   Vital Signs:  Patient profile:   77 year old Troy Ray Weight:      187 pounds BMI:     26.93 O2 Sat:      98 % on Room air Temp:     97.8 degrees F oral Pulse rate:   61 / minute Pulse rhythm:   regular Resp:     18 per minute BP sitting:   128 / 70  (left arm) Cuff size:   large  Vitals Entered By: Glendell Docker CMA (June 21, 2010 11:00 AM)  O2 Flow:  Room air CC: 6 Month Followup  Is Patient Diabetic? No Pain Assessment Patient in pain? no       Does patient need assistance? Functional Status Self care Ambulation Normal   Primary Care Provider:  [name] DO  CC:  6 Month Followup .  History of Present Illness: Troy Troy Ray for f/u htn - stable.  no dizziness or pre syncope he has been taking full dose of benicar 40 mg  he reports mild lower abd pain after having bowel movement no other assoc symptoms  he notes urine stream occ weak.  occ urgency  Preventive Screening-Counseling & Management  Alcohol-Tobacco     Smoking Status: quit  Allergies (verified): No Known Drug Allergies  Past History:  Past Medical History: CORONARY ARTERY DISEASE (ICD-414.00)status post 3-vessel coronary artery bypass grafting in 2000.  Normal LVF ( Dr. [name]) HYPERTENSION (ICD-401.9)  HYPERLIPIDEMIA (ICD-272.4)  FAMILY HISTORY OF CAD Troy Ray 1ST DEGREE RELATIVE <50 (ICD-V17.3) UNSPECIFIED PERIPHERAL VASCULAR DISEASE (ICD-443.9) POSTURAL LIGHTHEADEDNESS (ICD-780.4) MEMORY LOSS (ICD-780.93) CHRONIC KIDNEY DISEASE STAGE II (MILD) (ICD-585.2)  OTHER DYSPHAGIA (ICD-787.29)   ESOPHAGEAL STRICTURE (ICD-530.3) CRI - Cr 1.57  03/03/08 Moderate to Severe sensorineural loss bilaterally   Hx of AAA Repair and bilateral renal artery stenting 1996 (Dr  [name]) carotid stenosis -right internal carotid, 60% to 79% stenosed.   left internal carotid artery at 40% to 59%.   Hx of recurrent esophageal stricture (Dr.  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   Lungs:  normal respiratory effort and normal breath sounds.   Heart:  normal rate, regular rhythm, and no gallop.   Prostate:  no nodules, no asymmetry, and 1+ enlarged.     Impression & Recommendations:  Problem # 1:  HYPERTENSION (ICD-401.9) pt has been taking higher dose of benicar.  resume 20 mg  His updated medication list for this problem includes:    Toprol Xl 25 Mg Xr24h-tab (Metoprolol succinate) .[name] Kitchen... Take 1 tablet by mouth once a day    Benicar 20 Mg Tabs (Olmesartan medoxomil) .[name] Kitchen... 1 tab once daily  BP today: 128/70 Prior BP: 136/80 (04/07/2010)  Labs Reviewed: K+: 4.7 (06/14/2010) Creat: : 1.44 (06/14/2010)   Chol: 154 (06/14/2010)   HDL: 37 (06/14/2010)   LDL: 85 (06/14/2010)   TG: 161 (06/14/2010)  Complete Medication List: 1)  Toprol Xl 25 Mg Xr24h-tab (Metoprolol succinate) .... Take 1 tablet by mouth once a day 2)  Crestor 20 Mg Tabs (Rosuvastatin calcium) .[name] KitchenMarland KitchenMarland Kitchen  Take 1 tablet by mouth once a day 3)  Nexium 40 Mg Cpdr (Esomeprazole magnesium) .... Take 1 tablet by mouth once a day 4)  Aspirin Ec Low Dose 81 Mg Tbec (Aspirin) .... Take 1 tablet by mouth once a day 5)  Benicar 20 Mg Tabs (Olmesartan medoxomil) .[name] Kitchen.. 1 tab once daily 6)  Zostavax 45409 Unt/0.49ml Solr (Zoster vaccine live) .... Administer vaccine x 1 7)  Finasteride 5 Mg Tabs (Finasteride) .... One by mouth once daily  Patient Instructions: 1)  Please schedule a follow-up appointment in 3 months. 2)  BMP prior to visit, ICD-9: 401.9 3)  PSA prior to visit, ICD-9: 600.01 4)  Drink only one cup of coffee per  day 5)  Replace tea with water 6)  Use fiber supplement (metamucile) once daily Prescriptions: FINASTERIDE 5 MG TABS (FINASTERIDE) one by mouth once daily  #30 x 5   Entered and Authorized by:   D. Thomos Lemons DO   Signed by:   D. Thomos Lemons DO on 06/21/2010   Method used:   [facility] [phone]* (retail)       [address]       [place], [place]  [address]       Ph: [phone]       Fax: [phone]   RxID:   [id number] ZOSTAVAX 19400 UNT/0.65ML SOLR (ZOSTER VACCINE LIVE) administer vaccine x 1  #1 x 0   Entered and Authorized by:   D. Thomos Lemons DO   Signed by:   D. Thomos Lemons DO on 06/21/2010   Method used:   Print then Give to Patient   RxID:   [id number] BENICAR 20 MG TABS (OLMESARTAN MEDOXOMIL) 1 tab once daily  #30 x 5   Entered and Authorized by:   D. Thomos Lemons DO   Signed by:   D. Thomos Lemons DO on 06/21/2010   Method used:   Electronically to        [facility] [phone]* (retail)       [address]       [place], [place]  [address]       Ph: [phone]       Fax: [phone]   RxID:   [id number]   Current Allergies (reviewed today): No known allergies

## 2010-12-06 NOTE — Assessment & Plan Note (Signed)
Summary: burning with urination/dizzy/nauseous/dt   Vital Signs:  Patient profile:   77 year old male Weight:      186 pounds BMI:     26.78 O2 Sat:      99 % on Room air Temp:     98.2 degrees F oral Pulse rate:   62 / minute Pulse rhythm:   regular Resp:     18 per minute BP sitting:   122 / 70  (right arm) Cuff size:   large  Vitals Entered By: Glendell Docker CMA (July 04, 2010 4:20 PM)  O2 Flow:  Room air CC: URINARY DISCOMFORT Is Patient Diabetic? No Pain Assessment Patient in pain? no        Primary Care Provider:  [name] DO  CC:  URINARY DISCOMFORT.  History of Present Illness: 77 y/o white male c/o urinary frequency, pressure and pain with urination he reports  room spinning after taking finasteride,  light headness he did not take finasteride yesterday and today. Patient states symptoms stared 1 week after starting medication finasteride  Allergies (verified): No Known Drug Allergies  Past History:  Past Medical History: CORONARY ARTERY DISEASE (ICD-414.00)status post 3-vessel coronary artery bypass grafting in 2000.  Normal LVF ( Dr. [name]) HYPERTENSION (ICD-401.9)  HYPERLIPIDEMIA (ICD-272.4)   FAMILY HISTORY OF CAD MALE 1ST DEGREE RELATIVE <50 (ICD-V17.3) UNSPECIFIED PERIPHERAL VASCULAR DISEASE (ICD-443.9) POSTURAL LIGHTHEADEDNESS (ICD-780.4) MEMORY LOSS (ICD-780.93) CHRONIC KIDNEY DISEASE STAGE II (MILD) (ICD-585.2)  OTHER DYSPHAGIA (ICD-787.29)   ESOPHAGEAL STRICTURE (ICD-530.3) CRI - Cr 1.57  03/03/08 Moderate to Severe sensorineural loss bilaterally   Hx of AAA Repair and bilateral renal artery stenting 1996 (Dr  [name]) carotid stenosis -right internal carotid, 60% to 79% stenosed.   left internal carotid artery at 40% to 59%.   Hx of recurrent esophageal stricture (Dr. 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   Lungs:  normal respiratory effort and normal breath sounds.   Heart:  normal rate, regular rhythm, and no gallop.   Abdomen:  soft, non-tender, and normal bowel sounds.   Prostate:  boggy and 1+ enlarged.   Extremities:  No lower extremity edema    Impression & Recommendations:  Problem # 1:  ACUTE PROSTATITIS (ICD-601.0) 77 y/o with probable prostatitis.   tx with cipro.  I doubt finasteride caused dizziness.  try to resume once urinary symptoms resolve.  Patient advised to call office if symptoms persist or worsen.  Complete Medication List: 1)  Toprol Xl 25 Mg Xr24h-tab (Metoprolol succinate) .... Take 1 tablet by mouth once a day 2)  Crestor 20 Mg Tabs (Rosuvastatin calcium) .... Take 1 tablet by mouth once a day 3)  Nexium 40 Mg Cpdr (Esomeprazole magnesium) .... Take 1 tablet by mouth once a day 4)  Aspirin Ec Low Dose 81 Mg Tbec (Aspirin) .... Take 1 tablet by mouth once a day 5)  Benicar 20 Mg Tabs (Olmesartan medoxomil) .[name] Kitchen.. 1 tab once daily 6)  Zostavax 16109 Unt/0.82ml Solr (Zoster vaccine live) .... Administer vaccine x 1 7)  Finasteride 5 Mg Tabs (Finasteride) .... One by mouth once daily 8)  Ciprofloxacin Hcl 500 Mg Tabs (Ciprofloxacin hcl) .... One by mouth two times a day  Other Orders: UA Dipstick w/o Micro (manual) ([address]) Specimen Handling (99000) T-Culture,  Urine ([id number])  Patient Instructions: 1)  Call our office if your symptoms do not  improve or gets worse. 2)  If your urinary symptoms get better, you can restart the finasteride.   3)  Start at 1/2 dose of finasteride  x 1 week then one by mouth once daily 4)  Please schedule a follow-up appointment in 2 months. Prescriptions: CIPROFLOXACIN HCL 500 MG TABS (CIPROFLOXACIN HCL) one by mouth two times a day  #20 x 0   Entered and Authorized by:   D. Thomos Lemons DO   Signed by:   D. Thomos Lemons DO on  07/04/2010   Method used:   Electronically to        [facility] [phone]* (retail)       [address]       [place], [place]  [address]       Ph: [phone]       Fax: [phone]   RxID:   [id number]     Current Allergies (reviewed today): No known allergies    Laboratory Results   Urine Tests    Routine Urinalysis   Color: yellow Appearance: Clear Glucose: negative   (Normal Range: Negative) Bilirubin: negative   (Normal Range: Negative) Ketone: negative   (Normal Range: Negative) Spec. Gravity: 1.020   (Normal Range: 1.003-1.035) Blood: small   (Normal Range: Negative) pH: 5.0   (Normal Range: 5.0-8.0) Protein: trace   (Normal Range: Negative) Urobilinogen: 0.2   (Normal Range: 0-1) Nitrite: negative   (Normal Range: Negative) Leukocyte Esterace: large   (Normal Range: Negative)

## 2010-12-06 NOTE — Progress Notes (Signed)
Summary: Finasteride quantity correction  Phone Note Refill Request Message from:  Fax from [facility] on September 15, 2010 4:33 PM  [facility] from [facility] that pt must fill a 90 day supply of Finasteride for insurance purposes. Rx resubmitted.  Next Appointment Scheduled: 09-22-10 Dr Artist Pais Initial call taken by: Mervin Kung CMA Duncan Dull),  September 15, 2010 4:35 PM    Prescriptions: FINASTERIDE 5 MG TABS (FINASTERIDE) one by mouth once daily  #90 x 1   Entered by:   Mervin Kung CMA (AAMA)   Authorized by:   D. Thomos Lemons DO   Signed by:   Mervin Kung CMA (AAMA) on 09/15/2010   Method used:   Electronically to        [facility] [phone]* (retail)       [address]       [place], [place]  [address]       Ph: [phone]       Fax: [phone]   RxID:   [id number]

## 2010-12-06 NOTE — Letter (Signed)
Summary: [facility]  [facility]   Imported By: Lanelle Bal 08/29/2010 10:44:52  _____________________________________________________________________  External Attachment:    Type:   Image     Comment:   External Document

## 2010-12-06 NOTE — Assessment & Plan Note (Signed)
Summary: 6 MONTH FOLLOW UP/[facility]   Vital Signs:  Patient profile:   77 year old Troy Ray Weight:      184.25 pounds BMI:     26.53 O2 Sat:      99 % on Room air Temp:     97.5 degrees F Pulse rate:   Troy / minute Pulse rhythm:   regular Resp:     22 per minute BP sitting:   134 / 80  (left arm) Cuff size:   large  Vitals Entered By: Glendell Docker CMA (January 05, 2010 11:07 AM)  O2 Flow:  Room air CC: RM 2- 6 Month Follow up disease management Comments folow up on medication, no concerns, refill on benicar   Primary Care Provider:  [name] DO  CC:  RM 2- 6 Month Follow up disease management.  History of Present Illness: 77 y/o white Troy Ray prev seen for dizziness symptoms much better since stopping amlodipine pt seen by cardiology carotid doppler - stable.  Allergies (verified): No Known Drug Allergies  Past History:  Past Medical History: CORONARY ARTERY DISEASE (ICD-414.00)status post 3-vessel coronary artery bypass grafting in 2000.  Normal LVF ( Dr. [name]) HYPERTENSION (ICD-401.9)  HYPERLIPIDEMIA (ICD-272.4)  FAMILY HISTORY OF CAD Troy Ray 1ST DEGREE RELATIVE <50 (ICD-V17.3) UNSPECIFIED PERIPHERAL VASCULAR DISEASE (ICD-443.9) POSTURAL LIGHTHEADEDNESS (ICD-780.4) MEMORY LOSS (ICD-780.93) CHRONIC KIDNEY DISEASE STAGE II (MILD) (ICD-585.2) OTHER DYSPHAGIA (ICD-787.29)  ESOPHAGEAL STRICTURE (ICD-530.3) CRI - Cr 1.Troy  03/03/08 Moderate to Severe sensorineural loss bilaterally   Hx of AAA Repair and bilateral renal artery stenting 1996 (Dr  [name]) carotid stenosis -right internal carotid, 60% to 79% stenosed.   left internal carotid artery at 40% to 59%.   Hx of recurrent esophageal stricture (Dr. [name]) Adenomatous Colon Polyps  Diverticulosis  Past Surgical History:  Repair of left inguinal hernia with mesh Armanda Heritage repair). SURGEON:  Angelia Mould. [name], M.D. 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   Lungs:  normal respiratory effort and normal breath sounds.   Heart:  normal rate, regular rhythm, and no gallop.   Extremities:  No lower extremity edema    Impression & Recommendations:  Problem # 1:  HYPERTENSION (ICD-401.9) Dizziness and presyncope completely resolved with cessation of amlodipine.  carotid doppler ordered by cardiology.   60-79% RICA stenosis,  40-59% LICA stenosis.  stable  His updated medication list for this problem includes:    Toprol Xl 25 Mg Xr24h-tab (Metoprolol succinate) .[name] Kitchen... Take 1 tablet by mouth once a day    Benicar 20 Mg Tabs (Olmesartan medoxomil) .[name] Kitchen... 1 by mouth once daily  BP today: 134/80 Prior BP: 140/82 (12/07/2009)  Labs Reviewed: K+: 4.8 (12/15/2009) Creat: : 1.43 (12/15/2009)   Chol: 146 (03/30/2009)   HDL: 37.10 (03/30/2009)   LDL: 82 (03/30/2009)   TG: 135.0 (03/30/2009)  Complete Medication List: 1)  Toprol Xl 25 Mg Xr24h-tab (Metoprolol succinate) .... Take 1 tablet by mouth once a day 2)  Crestor 20 Mg Tabs (Rosuvastatin calcium) .... Take 1 tablet by mouth once a day 3)  Nexium 40 Mg Cpdr (Esomeprazole magnesium) .... Take 1 tablet by  mouth once a day 4)  Aspirin Ec Low Dose 81 Mg Tbec (Aspirin) .... Take 1 tablet by mouth once a day 5)  Benicar 20 Mg Tabs (Olmesartan medoxomil) .[name] Kitchen.. 1 by mouth once daily  Patient Instructions: 1)  Please schedule a follow-up appointment in 6 months. 2)  BMP prior to visit, ICD-9: 401.9 3)  Please return for lab work one (1) week before your next appointment.  Prescriptions: BENICAR 20 MG TABS (OLMESARTAN MEDOXOMIL) 1 by mouth once daily  #30 x 5   Entered and Authorized by:   D. Thomos Lemons DO   Signed by:   D. Thomos Lemons DO on  01/05/2010   Method used:   Electronically to        [facility] [phone]* (retail)       [address]       [place], [place]  [address]       Ph: [phone]       Fax: [phone]   RxID:   [id number]   Current Allergies (reviewed today): No known allergies

## 2010-12-06 NOTE — Progress Notes (Signed)
Summary: Finasteride Refill  Phone Note Refill Request Message from:  Patient on September 15, 2010 8:17 AM  Refills Requested: Medication #1:  FINASTERIDE 5 MG TABS one by mouth once daily   Dosage confirmed as above?Dosage Confirmed   Brand Name Necessary? No   Supply Requested: 1 month  Method Requested: Electronic Next Appointment Scheduled: 09-15-10 gi  Initial call taken by: Roselle Locus,  September 15, 2010 8:17 AM  Follow-up for Phone Call        Rx completed in Dr. Tiajuana Amass Follow-up by: Glendell Docker CMA,  September 15, 2010 8:28 AM    Prescriptions: FINASTERIDE 5 MG TABS (FINASTERIDE) one by mouth once daily  #30 x 5   Entered by:   Glendell Docker CMA   Authorized by:   D. Thomos Lemons DO   Signed by:   Glendell Docker CMA on 09/15/2010   Method used:   Electronically to        [facility] [phone]* (retail)       [address]       [place], [place]  [address]       Ph: [phone]       Fax: [phone]   RxID:   [id number]

## 2010-12-06 NOTE — Progress Notes (Signed)
Summary: triage   Phone Note Call from Patient Call back at Home Phone [phone]   Caller: Spouse lou Call For: [name] Reason for Call: Talk to Nurse Summary of Call: Patient is having a hard time swallowing and was told b Dr [name] not to wait unitl it gets bad but first available appt is 4-5, pt cant wait that long. Initial call taken by: Tawni Levy,  January 10, 2010 3:48 PM  Follow-up for Phone Call         Pt. is beginning to  havie dysphagia with solids.Reminded to avoid bread,rice and [place] and to chop meats  fine and use some gravy with it.Wife says he hasn't be to faithful at doing this since he hasn't had any problms for awhile Follow-up by: Teryl Lucy RN,  January 10, 2010 4:03 PM  Additional Follow-up for Phone Call Additional follow up Details #1::        schedule repeat egd with balloon dilitation Additional Follow-up by: Louis Meckel MD,  January 10, 2010 4:30 PM  New Problems: HEMATOCHEZIA (ICD-578.1)   Additional Follow-up for Phone Call Additional follow up Details #2::     Do you want ball./dil scheduled at hospital or [facility]? Follow-up by: Teryl Lucy RN,  January 11, 2010 7:59 AM  Additional Follow-up for Phone Call Additional follow up Details #3:: Details for Additional Follow-up Action Taken: at the hospital this week.  ok to use 12:30 on Thursday Additional Follow-up by: Louis Meckel MD,  January 11, 2010 9:34 AM  New Problems: HEMATOCHEZIA (ICD-578.1)   Pt. is working at [employer] can it be done MON. or Tues. or the following week?YES-Scheduled for balloon/dil at [facility] 01/18/2010 at 12:30 pm.

## 2010-12-06 NOTE — Progress Notes (Signed)
Summary: refill--nexium  Phone Note Refill Request Message from:  [facility] April 28, 2010 2:14 PM  Refills Requested: Medication #1:  NEXIUM 40 MG CPDR Take 1 tablet by mouth once a day   Dosage confirmed as above?Dosage Confirmed   Supply Requested: 3 months   Last Refilled: 12/27/2009 Next Appointment Scheduled: 06/08/10--Dr. Artist Pais Initial call taken by: Mervin Kung CMA,  April 28, 2010 2:14 PM    Prescriptions: NEXIUM 40 MG CPDR (ESOMEPRAZOLE MAGNESIUM) Take 1 tablet by mouth once a day  #90 x 0   Entered by:   Mervin Kung CMA   Authorized by:   D. Thomos Lemons DO   Signed by:   Mervin Kung CMA on 04/28/2010   Method used:   Electronically to        [facility] [phone]* (retail)       [address]       [place], [place]  [address]       Ph: [phone]       Fax: [phone]   RxID:   [id number]

## 2010-12-06 NOTE — Letter (Signed)
   [place] [facility] [address] [place], [place]  [address]  [place] Phone: [phone]      December 16, 2009   Troy Ray [address] [place], [place] [address]  RE:  LAB RESULTS  Dear  [name],  The following is an interpretation of your most recent lab tests.  Please take note of any instructions provided or changes to medications that have resulted from your lab work.  ELECTROLYTES:  Good - no changes needed  KIDNEY FUNCTION TESTS:  Stable - no changes needed         Sincerely Yours,    Dr. Thomos Lemons  Appended Document:  Mailed.

## 2010-12-06 NOTE — Miscellaneous (Signed)
Summary: lab order bmp,hepfx,lipid,tsh  Clinical Lists Changes  Orders: Added new Test order of T-Basic Metabolic Panel [id number]) - Signed Added new Test order of T-Hepatic Function [phone]) - Signed Added new Test order of T-Lipid Profile [phone]) - Signed Added new Test order of T-TSH ([id number]) - Signed

## 2010-12-06 NOTE — Progress Notes (Signed)
Summary: Medication Change  Phone Note Outgoing Call   Summary of Call: call pt - blood test shows possible dehydration.  increase fluid intake over next 2 days.    repeat BMET next week.  make sure pt not taking and NSAIDs.    stop benicar / hctz.  have pt pick up samples of benicar 20 mg and take only 1/2 by mouth once daily.  start 1/2 of benicar 20 mg 12/04/2009 Initial call taken by: D. Thomos Lemons DO,  December 01, 2009 12:49 PM  Follow-up for Phone Call        attemtped to contact patient at [phone] no answer, voice message left to call regarding medication change. Follow-up by: Glendell Docker CMA,  December 01, 2009 4:50 PM  Additional Follow-up for Phone Call Additional follow up Details #1::        patients wife returned phone call, and that she be called back at [phone].   Phone call returned,patients wife stated that patient has a hard time hearing and she would relay the information to him. She has been advised per Dr Artist Pais instructions. She states patient already has an appointment scheduled, and she will pick samples up as instructed Additional Follow-up by: Glendell Docker CMA,  December 02, 2009 12:13 PM    New/Updated Medications: BENICAR 20 MG TABS (OLMESARTAN MEDOXOMIL) 1/2 by mouth once daily

## 2010-12-06 NOTE — Miscellaneous (Signed)
Summary: Orders Update  Clinical Lists Changes  Orders: Added new Test order of Carotid Duplex (Carotid Duplex) - Signed 

## 2010-12-06 NOTE — Assessment & Plan Note (Signed)
Summary: rov/lightheadness/dizzines    Visit Type:  Follow-up Primary Provider:  [name] DO  CC:  dizziness better since off medicine.  History of Present Illness: This is a 77 year old white male patient with history of coronary artery disease, carotid artery disease, and hypertension, who is referred to [place] by Dr.[name] for further evaluation of dizziness.  The patient was working at the [employer] last week during the ice skating championship and had an episode of dizziness that lasted 10-15 minutes. He was checked by the paramedics and told blood pressure was low and his medications need to be adjusted. Prior to this he was having dizziness when he would get up from a sitting position to a standing position, but if he stopped and waited the dizziness would pass quickly. His blood pressure was low at Dr.[name]'s office and his amlodipine was stopped, and Benicar, hydrochlorothiazide, changed to Benicar alone at half the dose.  Since these changes were made, the patient is feeling much better. He denies any further dizziness or presyncope. He denies chest pain, palpitations, dyspnea, dyspnea on exertion.  Current Medications (verified): 1)  Toprol Xl 25 Mg Xr24h-Tab (Metoprolol Succinate) .... Take 1 Tablet By Mouth Once A Day 2)  Crestor 20 Mg Tabs (Rosuvastatin Calcium) .... Take 1 Tablet By Mouth Once A Day 3)  Nexium 40 Mg Cpdr (Esomeprazole Magnesium) .... Take 1 Tablet By Mouth Once A Day 4)  Aspirin Ec Low Dose 81 Mg Tbec (Aspirin) .... Take 1 Tablet By Mouth Once A Day 5)  Benicar 20 Mg Tabs (Olmesartan Medoxomil) .... 1/2 By Mouth Once Daily  Allergies: No Known Drug Allergies  Past History:  Past Medical History: Last updated: 11/30/2009 CORONARY ARTERY DISEASE (ICD-414.00)status post 3-vessel coronary artery bypass grafting in 2000.  Normal LVF ( Dr. [name]) HYPERTENSION (ICD-401.9) HYPERLIPIDEMIA (ICD-272.4)  FAMILY HISTORY OF CAD MALE 1ST DEGREE RELATIVE <50  (ICD-V17.3) UNSPECIFIED PERIPHERAL VASCULAR DISEASE (ICD-443.9) POSTURAL LIGHTHEADEDNESS (ICD-780.4) MEMORY LOSS (ICD-780.93) CHRONIC KIDNEY DISEASE STAGE II (MILD) (ICD-585.2) OTHER DYSPHAGIA (ICD-787.29)  ESOPHAGEAL STRICTURE (ICD-530.3) CRI - Cr 1.57  03/03/08 Moderate to Severe sensorineural loss bilaterally   Hx of AAA Repair and bilateral renal artery stenting 1996 (Dr  [name]) carotid stenosis -right internal carotid, 60% to 79% stenosed.   left internal carotid artery at 40% to 59%.   Hx of recurrent esophageal stricture (Dr. [name]) Adenomatous Colon Polyps  Diverticulosis  Review of Systems       see history of present illness  Vital Signs:  Patient profile:   77 year old male Height:      70 inches Weight:      180 pounds BP sitting:   140 / 82  (right arm)  Vitals Entered By: Jacquelin Hawking, CMA (December 07, 2009 9:49 AM)  Physical Exam  General:   Well-nournished, in no acute distress. Neck: Bilateral carotid bruits,No JVD, HJR, or thyroid enlargement Lungs: No tachypnea, clear without wheezing, rales, or rhonchi Cardiovascular: RRR, PMI not displaced, heart sounds normal, no murmurs, gallops, bruit, thrill, or heave. Abdomen: BS normal. Soft without organomegaly, masses, lesions or tenderness. Extremities: without cyanosis, clubbing or edema. Good distal pulses bilateral SKin: Warm, no lesions or rashes  Musculoskeletal: No deformities Neuro: no focal signs    Impression & Recommendations:  Problem # 1:  POSTURAL LIGHTHEADEDNESS (ICD-780.4) Patient had postural dizziness, which has completely resolved since stopping the amlodipine and decreasing his Benicar.Patient did have her prolonged episode of dizziness while working at [employer]. He does have  carotid artery disease and is due to have those checked in June. We will go ahead and repeat those now to make sure there has been no progression.  Problem # 2:  HYPERTENSION (ICD-401.9) Patient's blood  pressure is up slightly today. I suspect he may need his Benicar increased to 20 mg daily. I asked him to check his blood pressure daily and call us with the results. I do not want to make any changes, since he's feeling much better based on one blood pressure reading. His updated medication list for this problem includes:    Toprol Xl 25 Mg Xr24h-tab (Metoprolol succinate) .[name] Kitchen... Take 1 tablet by mouth once a day    Aspirin Ec Low Dose 81 Mg Tbec (Aspirin) .[name] Kitchen... Take 1 tablet by mouth once a day    Benicar 20 Mg Tabs (Olmesartan medoxomil) .[name] Kitchen... 1/2 by mouth once daily  Problem # 3:  CORONARY ARTERY DISEASE (ICD-414.00) patient denies chest pain. Myoview in 2010, was normal. His updated medication list for this problem includes:    Toprol Xl 25 Mg Xr24h-tab (Metoprolol succinate) .[name] Kitchen... Take 1 tablet by mouth once a day    Aspirin Ec Low Dose 81 Mg Tbec (Aspirin) .[name] Kitchen... Take 1 tablet by mouth once a day  Other Orders: Carotid Duplex (Carotid Duplex)  Patient Instructions: 1)  Your physician recommends that you schedule a follow-up appointment in: in June/11 with Dr. [name] 2)  Your physician has requested that you have a carotid duplex. This test is an ultrasound of the carotid arteries in your neck. It looks at blood flow through these arteries that supply the brain with blood. Allow one hour for this exam. There are no restrictions or special instructions. 3)  Take your B/P daily . Call the office if B/P is over 135/85 . Call for low B/P less than 90 systalic 4)  Your physician has requested that you limit the intake of sodium (salt) in your diet to two grams daily. Please see [facility] handout.

## 2010-12-06 NOTE — Procedures (Signed)
Summary: Upper Endoscopy  Patient: Elisah Parmer Note: All result statuses are Final unless otherwise noted.  Tests: (1) Upper Endoscopy (EGD)   EGD Upper Endoscopy       DONE     [facility]     [address] [place], [place]  [address]           ENDOSCOPY PROCEDURE REPORT           PATIENT:  Callaway, Hailes  MR#:  [id number]     BIRTHDATE:  03-13-1928, 82 yrs. old  GENDER:  male           ENDOSCOPIST:  Barbette Hair. [name], MD     Referred by:           PROCEDURE DATE:  09/15/2010     PROCEDURE:  EGD with balloon dilatation     ASA CLASS:  Class II     INDICATIONS:  dilation of esophageal stricture           MEDICATIONS:   Fentanyl 50 mcg IV, Versed 3 mg IV, glycopyrrolate     (Robinal) 0.2 mg IV     TOPICAL ANESTHETIC:  Cetacaine Spray           DESCRIPTION OF PROCEDURE:   After the risks benefits and     alternatives of the procedure were thoroughly explained, informed     consent was obtained.  The Pentax Gastroscope [id number] endoscope     was introduced through the mouth and advanced to the third portion     of the duodenum, without limitations.  The instrument was slowly     withdrawn as the mucosa was fully examined.     <<PROCEDUREIMAGES>>           A stricture was found at the gastroesophageal junction. Moderate     stricture with adjacent ulcer balloon dilation 15-16 (see image3     and image2).5-40mm for 30 seconds each; minimal heme; moderate     resistance  Two  ulcers were  found at the gastroesophageal     junction.  A hiatal hernia was found. 7cm sliding hiatal hernia     Otherwise the examination was normal.    Retroflexed views revealed     no abnormalities.    The scope was then withdrawn from the patient     and the procedure completed.           COMPLICATIONS:  None           ENDOSCOPIC IMPRESSION:     1) Stricture at the gastroesophageal junction - s/p balloon     dilitation     2) Ulcers at the gastroesophageal junction     3) Hiatal  hernia     4) Otherwise normal examination     RECOMMENDATIONS:     1) continue PPI           REPEAT EXAM:  No           ______________________________     Barbette Hair. [name], MD           CC:  Thomos Lemons, DO           n.     eSIGNED:   Barbette Hair. Kaplan at 09/15/2010 01:04 PM           Theodis Blaze, [id number]  Note: An exclamation mark (!) indicates a result that was not dispersed into the flowsheet. Document Creation Date: 09/15/2010 1:05 PM  _______________________________________________________________________  (1) Order result status: Final Collection or observation date-time: 09/15/2010 12:59 Requested date-time:  Receipt date-time:  Reported date-time:  Referring Physician:   Ordering Physician: Melvia Heaps [phone]) Specimen Source:  Source: Launa Grill Order Number: [id number] Lab site:

## 2010-12-06 NOTE — Miscellaneous (Signed)
Summary: lab orders: bmp, psa  Clinical Lists Changes  Orders: Added new Test order of T-Basic Metabolic Panel [phone]) - Signed Added new Test order of T-PSA [id number]) - Signed

## 2010-12-06 NOTE — Progress Notes (Signed)
Summary: refill--metoprolol  Phone Note Refill Request Message from:  [facility] [phone] on 04/16/10 1:38pm  Refills Requested: Medication #1:  TOPROL XL 25 MG XR24H-TAB Take 1 tablet by mouth once a day   Dosage confirmed as above?Dosage Confirmed   Supply Requested: 3 months   Last Refilled: 01/08/2010 Next Appointment Scheduled: 06/15/10--Dr. Artist Pais Initial call taken by: Mervin Kung CMA,  April 18, 2010 8:48 AM    Prescriptions: TOPROL XL 25 MG XR24H-TAB (METOPROLOL SUCCINATE) Take 1 tablet by mouth once a day  #90 x 3   Entered by:   Mervin Kung CMA   Authorized by:   D. Thomos Lemons DO   Signed by:   Mervin Kung CMA on 04/18/2010   Method used:   [facility]        [facility] [phone]* (retail)       [address]       [place], [place]  [address]       Ph: [phone]       Fax: [phone]   RxID:   [id number]

## 2010-12-06 NOTE — Progress Notes (Signed)
Summary: calling with b/p readings   Phone Note Call from Patient Call back at Home Phone [phone]   Caller: Patient Complaint: Breathing Problems Summary of Call: Calling with b/p readings for three days 144/80 134/72 140/72 12/08/09-12/10/09 Initial call taken by: Judie Grieve,  December 10, 2009 2:01 PM  Follow-up for Phone Call        No change in meds for now.  Follow-up by: Gaylord Shih, MD, [facility],  December 10, 2009 3:40 PM     Appended Document: calling with b/p readings PT'S WIFE  AWARE./CY

## 2010-12-06 NOTE — Assessment & Plan Note (Signed)
Summary: 3 MONTH FU/DT   Vital Signs:  Patient profile:   77 year old male Height:      70 inches Weight:      186 pounds BMI:     26.78 Temp:     97.6 degrees F oral Pulse rate:   66 / minute Pulse rhythm:   regular Resp:     18 per minute BP sitting:   144 / 70  (left arm) Cuff size:   regular  Vitals Entered By: Mervin Kung CMA [facility]) (September 22, 2010 10:28 AM) CC: Pt here for 3 month follow up. Is Patient Diabetic? No Pain Assessment Patient in pain? no      Comments Pt states he never got the Zoxtavax injection. Nicki Guadalajara Fergerson CMA [facility])  September 22, 2010 10:36 AM    Primary Care Provider:  [name] DO  CC:  Pt here for 3 month follow up.[name] Kitchen  History of Present Illness:  Hypertension Follow-Up      This is an 77 year old man who presents for Hypertension follow-up.  The patient denies lightheadedness.  The patient denies the following associated symptoms: chest pain.  Compliance with medications (by patient report) has been near 100%.  The patient reports that dietary compliance has been fair.    patient complains of rash on buttocks question small boil No fever, no redness  Preventive Screening-Counseling & Management  Alcohol-Tobacco     Smoking Status: quit     Year Quit: 1982     Pack years: 12 pk years  Allergies (verified): No Known Drug Allergies  Past History:  Past Medical History: CORONARY ARTERY DISEASE (ICD-414.00)status post 3-vessel coronary artery bypass grafting in 2000.  Normal LVF ( Dr. [name]) HYPERTENSION (ICD-401.9)   HYPERLIPIDEMIA (ICD-272.4)   FAMILY HISTORY OF CAD MALE 1ST DEGREE RELATIVE <50 (ICD-V17.3) UNSPECIFIED PERIPHERAL VASCULAR DISEASE (ICD-443.9) POSTURAL LIGHTHEADEDNESS (ICD-780.4) MEMORY LOSS (ICD-780.93) CHRONIC KIDNEY DISEASE STAGE II (MILD) (ICD-585.2)  OTHER DYSPHAGIA (ICD-787.29)   ESOPHAGEAL STRICTURE (ICD-530.3) CRI - Cr 1.57  03/03/08 Moderate to Severe sensorineural loss bilaterally   Hx of  AAA Repair and bilateral renal artery stenting 1996 (Dr  [name]) carotid stenosis -right internal carotid, 60% to 79% stenosed.   left internal carotid artery at 40% to 59%.   Hx of recurrent esophageal stricture (Dr. [name]) Adenomatous Colon Polyps  Diverticulosis  Family History: Family History of CAD Male 1st degree relative <50  (Father died 85) Diabetes - no     No FH of Colon Cancer:       Social History: 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 3 sons   Alcohol use-yes Former smoker          Physical Exam  General:  alert, well-developed, and well-nourished.   Lungs:  normal respiratory effort and normal breath sounds.   Heart:  normal rate, regular rhythm, and no gallop.   Extremities:  No lower extremity edema  Skin:  subcentimeter nodule left buttock Mild surrounding erythema and excoriated appearance    Impression & Recommendations:  Problem # 1:  HYPERTENSION (ICD-401.9) Assessment Unchanged  His updated medication list for this problem includes:    Toprol Xl 25 Mg Xr24h-tab (Metoprolol succinate) .[name] Kitchen... Take 1 tablet by mouth once a day    Benicar 20 Mg Tabs (Olmesartan medoxomil) .[name] Kitchen... 1 tab once daily  BP today: 144/70 Prior BP: 122/70 (07/04/2010)  Labs Reviewed: K+: 4.8 (09/15/2010) Creat: : 1.42 (09/15/2010)   Chol: 154 (06/14/2010)  HDL: 37 (06/14/2010)   LDL: 85 (06/14/2010)   TG: 161 (06/14/2010)  Problem # 2:  CARBUNCLE AND FURUNCLE OF BUTTOCK (ICD-680.5) pt with small / tiny carbuncel local care recommended  trial of sitz bath Patient advised to call office if symptoms persist or worsen.  Complete Medication List: 1)  Toprol Xl 25 Mg Xr24h-tab (Metoprolol succinate) .... Take 1 tablet by mouth once a day 2)  Crestor 20 Mg Tabs (Rosuvastatin calcium) .... Take 1 tablet by mouth once a day 3)  Nexium 40 Mg Cpdr (Esomeprazole magnesium) .... Take 1 tablet by mouth once a day 4)  Aspirin Ec Low Dose 81 Mg Tbec  (Aspirin) .... Take 1 tablet by mouth once a day 5)  Benicar 20 Mg Tabs (Olmesartan medoxomil) .[name] Kitchen.. 1 tab once daily 6)  Zostavax [address] Unt/0.28ml Solr (Zoster vaccine live) .... Administer vaccine x 1 7)  Finasteride 5 Mg Tabs (Finasteride) .... One by mouth once daily  Other Orders: Zoster (Shingles) Vaccine Live [phone]) Admin 1st Vaccine ([address]11)  Patient Instructions: 1)  Use sitz bath once to twice per week 2)  Apply anti fungal powder over the counter in AM once daily 3)  Please schedule a follow-up appointment in 6 months. Prescriptions: BENICAR 20 MG TABS (OLMESARTAN MEDOXOMIL) 1 tab once daily  #90 x 1   Entered and Authorized by:   D. Thomos Lemons DO   Signed by:   D. Thomos Lemons DO on 09/22/2010   Method used:   [facility]        [facility] [phone][facility]retail[facility]       [address]       [place], [place]  [address]       Ph: [phone]       Fax: [phone]   RxID:   [id number] NEXIUM 40 MG CPDR (ESOMEPRAZOLE MAGNESIUM) Take 1 tablet by mouth once a day  #90 Capsule x 1   Entered and Authorized by:   D. Thomos Lemons DO   Signed by:   D. Thomos Lemons DO on 09/22/2010   Method used:   [facility] to        [facility] [phone]* (retail)       [address]       [place], [place]  [address]       Ph: [phone]       Fax: [phone]   RxID:   [id number] CRESTOR 20 MG TABS (ROSUVASTATIN CALCIUM) Take 1 tablet by mouth once a day  #90 x 1   Entered and Authorized by:   D. Thomos Lemons DO   Signed by:   D. Thomos Lemons DO on 09/22/2010   Method used:   [facility] to        [facility] [phone]* (retail)       [address]       [place], [place]  [address]       Ph: [phone]       Fax: [phone]   RxID:   [id number] TOPROL XL 25 MG XR24H-TAB (METOPROLOL SUCCINATE) Take 1 tablet by mouth once a day  #90 x 1   Entered and Authorized by:   D. Thomos Lemons DO    Signed by:   D. Thomos Lemons DO on 09/22/2010   Method used:   [facility]ron[facility] to  [facility] [phone]* (retail)       [address]       [place], [place]  [address]       Ph: [phone]       Fax: [phone]   RxID:   [id number]    Orders Added: 1)  Zoster (Shingles) Vaccine Live [90736] 2)  Admin 1st Vaccine [90471] 3)  Est. Patient Level III [[address]]   Immunizations Administered:  Zostavax # 1:    Vaccine Type: Zostavax    Site: left anterior arm    Mfr: Merck    Dose: 0.5 ml    Route: [place]    Given by: Mervin Kung CMA (AAMA)    Exp. Date: 06/24/2011    Lot #: [id number]   Immunizations Administered:  Zostavax # 1:    Vaccine Type: Zostavax    Site: left anterior arm    Mfr: Merck    Dose: 0.5 ml    Route: [place]    Given by: Mervin Kung CMA (AAMA)    Exp. Date: 06/24/2011    Lot #: [id number]  Current Allergies (reviewed today): No known allergies

## 2010-12-08 ENCOUNTER — Encounter: Payer: Self-pay | Admitting: Internal Medicine

## 2010-12-08 NOTE — Assessment & Plan Note (Signed)
Summary: HOSPITAL FOLLOW UP/[facility]   Vital Signs:  Patient profile:   77 year old male Height:      70 inches Weight:      187.25 pounds BMI:     26.96 O2 Sat:      99 % on Room air Temp:     97.9 degrees F oral Pulse rate:   59 / minute Resp:     20 per minute BP sitting:   148 / 72  (right arm) BP standing:   120 / 60  (right arm) Cuff size:   large  Vitals Entered By: Glendell Docker CMA (November 17, 2010 1:31 PM)  O2 Flow:  Room air CC: Hospital Follow up Is Patient Diabetic? No Pain Assessment Patient in pain? no      Comments wife states patient left hospital with no explanation of what or why that happened   Primary Care Provider:  D. Thomos Lemons DO  CC:  Hospital Follow up.  History of Present Illness: 77 y/o white male for hosp f/u pt admitted in last Dec 2011 for gait ataxia - rule out CVA pt notes some difficulty with left leg weakness MRI of brain negative for stroke  CT Angio neck reviewed  pt feeling better.  usually lightheadedness when he first stands  wife concerned he doesn't drink enough fluids he denies dehydrating illness before hospitalization  aspirin switched to plavix. he denies adverse effect no abnormal bleeding  hosp lab studies reviewed.  pt A1c elevated at 6.4  Preventive Screening-Counseling & Management  Alcohol-Tobacco     Smoking Status: quit  Allergies (verified): No Known Drug Allergies  Past History:  Past Medical History: CORONARY ARTERY DISEASE (ICD-414.00)status post 3-vessel coronary artery bypass grafting in 2000.  Normal LVF ( Dr. [name]) HYPERTENSION (ICD-401.9)   HYPERLIPIDEMIA (ICD-272.4)    FAMILY HISTORY OF CAD MALE 1ST DEGREE RELATIVE <50 (ICD-V17.3) UNSPECIFIED PERIPHERAL VASCULAR DISEASE (ICD-443.9) POSTURAL LIGHTHEADEDNESS (ICD-780.4) MEMORY LOSS (ICD-780.93) CHRONIC KIDNEY DISEASE STAGE II (MILD) (ICD-585.2)  OTHER DYSPHAGIA (ICD-787.29)   ESOPHAGEAL STRICTURE (ICD-530.3) CRI - Cr 1.57   03/03/08 Moderate to Severe sensorineural loss bilaterally   Hx of AAA Repair and bilateral renal artery stenting 1996 (Dr  [name]) carotid stenosis -right internal carotid, 60% to 79% stenosed.   left internal carotid artery at 40% to 59%.   Hx of recurrent esophageal stricture (Dr. [name]) Adenomatous Colon Polyps  Diverticulosis  Past Surgical History:  Repair of left inguinal hernia with mesh Armanda Heritage repair). SURGEON:  Angelia Mould. [name], M.D. 03/23/2005       Family History: Family History of CAD Male 1st degree relative <50  (Father died 66) Diabetes - no     No FH of Colon Cancer:         Social History: 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 3 sons   Alcohol use-yes Former smoker           Review of Systems  The patient denies chest pain and dyspnea on exertion.    Physical Exam  General:  alert, well-developed, and well-nourished.   Head:  normocephalic and atraumatic.   Eyes:  pupils equal, pupils round, and pupils reactive to light.   Neck:  supple and no masses.  no carotid bruits.   Lungs:  normal respiratory effort and normal breath sounds.   Heart:  normal rate, regular rhythm, and no gallop.   Abdomen:  soft, non-tender, normal bowel sounds, no hepatomegaly, and no splenomegaly.   Extremities:  trace left pedal edema and trace right pedal edema.   Neurologic:  cranial nerves II-XII intact.  ambulates with single prong cane Psych:  normally interactive, good eye contact, not anxious appearing, and not depressed appearing.     Impression & Recommendations:  Problem # 1:  HYPERTENSION (ICD-401.9) pt likely has autonomic dysfunction pt advised to continue to get up slowly with changing positions increase fluid and salt intake  His updated medication list for this problem includes:    Toprol Xl 25 Mg Xr24h-tab (Metoprolol succinate) .[name] Kitchen... Take 1 tablet by mouth once a day    Benicar 20 Mg Tabs (Olmesartan medoxomil) .[name] Kitchen...  1 tab once daily  Orders: T-Basic Metabolic Panel [id number])  Problem # 2:  LACK OF COORDINATION (ICD-781.3) Assessment: Improved finish out pt PT  Problem # 3:  HYPERGLYCEMIA (ICD-790.29) Assessment: New A1c 6.4 Pt counseled on diet and exercise.  monitor A1c at next OV  Problem # 4:  CAROTID ARTERY STENOSIS, WITHOUT INFARCTION (ICD-433.10) Assessment: Unchanged  His updated medication list for this problem includes:    Plavix 75 Mg Tabs (Clopidogrel bisulfate) .[name] Kitchen... Take 1 tablet by mouth once a day  Orders: T-CBC No Diff ([id number])  Complete Medication List: 1)  Toprol Xl 25 Mg Xr24h-tab (Metoprolol succinate) .... Take 1 tablet by mouth once a day 2)  Crestor 20 Mg Tabs (Rosuvastatin calcium) .... Take 1 tablet by mouth once a day 3)  Nexium 40 Mg Cpdr (Esomeprazole magnesium) .... Take 1 tablet by mouth once a day 4)  Benicar 20 Mg Tabs (Olmesartan medoxomil) .[name] Kitchen.. 1 tab once daily 5)  Finasteride 5 Mg Tabs (Finasteride) .... One by mouth once daily 6)  Plavix 75 Mg Tabs (Clopidogrel bisulfate) .... Take 1 tablet by mouth once a day  Other Orders: T-TSH ([id number])  Patient Instructions: 1)  Please schedule a follow-up appointment in 3 months.   Orders Added: 1)  T-Basic Metabolic Panel [[id number]] 2)  T-CBC No Diff [[id number]] 3)  T-TSH [[id number]] 4)  Est. Patient Level IV [28413]   Immunization History:  Influenza Immunization History:    Influenza:  historical (08/23/2010)  Pneumovax Immunization History:    Pneumovax:  historical (11/20/2006)   Immunization History:  Influenza Immunization History:    Influenza:  Historical (08/23/2010)  Pneumovax Immunization History:    Pneumovax:  Historical (11/20/2006)  Current Allergies (reviewed today): No known allergies

## 2010-12-08 NOTE — Letter (Signed)
   [facility] [address] [place], [place]  [address]  [place] Phone: [phone]      November 18, 2010   Troy Ray [address] [place], [place] [address]  RE:  LAB RESULTS  Dear  Mr. Brissett,  The following is an interpretation of your most recent lab tests.  Please take note of any instructions provided or changes to medications that have resulted from your lab work.  ELECTROLYTES:  Good - no changes needed  KIDNEY FUNCTION TESTS:  Stable - no changes needed    CBC:  Good - no changes needed       Sincerely Yours,    Dr. Thomos Lemons  Appended Document:  mailed

## 2010-12-08 NOTE — Procedures (Signed)
Summary: EGD / [facility]  EGD / [facility]   Imported By: Lennie Odor 10/17/2010 14:53:37  _____________________________________________________________________  External Attachment:    Type:   Image     Comment:   External Document

## 2010-12-08 NOTE — Progress Notes (Signed)
Summary: Status Update  Phone Note Call from Patient Call back at Home Phone [phone]   Caller: Spouse- Markham Jordan Call For: [name] DO Summary of Call: patient wife called and left voice message requesting for a return call.   call was returned to patient she states patient was taken to the ER as instructed. His wife states that he was admitted for a stroke and was advised to follow up with Dr Artist Pais after he was discharged. She states the hospital wanted to make sure that Dr Artist Pais  had records on admission. Patients wife Markham Jordan states they took patient off of the aspirin and placed him on Plavix, all the other medications have remained the same. She was advised to have him schedule a hospital follow up. She states patient is waiting to be scheduled for physical therapy and once she hears back on the status of the physical therapy then she call back to schedule a follow up appointment with Dr Artist Pais. Initial call taken by: Glendell Docker CMA,  November 01, 2010 9:53 AM    New/Updated Medications: PLAVIX 75 MG TABS (CLOPIDOGREL BISULFATE) Take 1 tablet by mouth once a day

## 2010-12-08 NOTE — Progress Notes (Signed)
Summary: Records Request   Faxed Carotid to [place] [facility] ([phone]).  Debby Freiberg  October 28, 2010 11:32 AM

## 2010-12-08 NOTE — Miscellaneous (Signed)
Summary: PT Progress Note/[facility]  PT Progress Note/[facility]   Imported By: Lanelle Bal 11/29/2010 14:02:08  _____________________________________________________________________  External Attachment:    Type:   Image     Comment:   External Document

## 2010-12-08 NOTE — Progress Notes (Signed)
Summary: refill--plavix  Phone Note Outgoing Call Message from:  Fax from Pharmacy on November 29, 2010 4:08 PM  Refills Requested: Medication #1:  PLAVIX 75 MG TABS Take 1 tablet by mouth once a day.   Dosage confirmed as above?Dosage Confirmed   Supply Requested: 1 month We have never filled rx for pt.  Next Appointment Scheduled: 02/16/11 Initial call taken by: Mervin Kung CMA (AAMA),  November 29, 2010 4:08 PM Summary of Call: ok to refill x 3 Initial call taken by: D. Thomos Lemons DO,  November 30, 2010 9:44 PM    Prescriptions: PLAVIX 75 MG TABS (CLOPIDOGREL BISULFATE) Take 1 tablet by mouth once a day  #30 x 2   Entered by:   Mervin Kung CMA (AAMA)   Authorized by:   D. Thomos Lemons DO   Signed by:   Mervin Kung CMA (AAMA) on 12/01/2010   Method used:   Electronically to        [facility] [phone]* (retail)       [address]       [place], [place]  [address]       Ph: [phone]       Fax: [phone]   RxID:   [id number]   Appended Document: refill--plavix Pharmacist called and requested to fill Plavix for 90 day supply. Authorization given for #90 x no refills.

## 2010-12-08 NOTE — Progress Notes (Signed)
Summary: Headache & off balance  Phone Note Call from Patient   Caller: Patient Details for Reason: WIFE on phone Summary of Call:  HA   x 2days   today having trouble focusing  Initial call taken by: Darral Dash,  October 27, 2010 10:26 AM  Follow-up for Phone Call        call returned to patient at [phone],   spoke with patients wife Truddie Hidden due to hearing difficulty by patient;, she states that patient has had a headaches for the past 2 days, and he had some timgling in his feet and ankles, trouble with walking and remains unbalance. She states that he has not stated that he has had type of pain, but has a very hard time with balancing himself to walk. She denies that patient has problems with speech or ability to understand. She was advised to take him to ER for evaluation due to onoging headaches and trouble with walking. Patients wife Truddie Hidden verbalized understaniding and states she will have him evaluated [facility] Follow-up by: Glendell Docker CMA,  October 27, 2010 11:20 AM

## 2011-01-16 LAB — POCT CARDIAC MARKERS
CKMB, poc: 2 ng/mL (ref 1.0–8.0)
Myoglobin, poc: 117 ng/mL (ref 12–200)

## 2011-01-16 LAB — LIPID PANEL
HDL: 33 mg/dL — ABNORMAL LOW (ref >39)
LDL Cholesterol: 61 mg/dL (ref 0–99)
Triglycerides: 154 mg/dL — ABNORMAL HIGH (ref <150)

## 2011-01-16 LAB — BASIC METABOLIC PANEL
BUN: 20 mg/dL (ref 6–23)
CO2: 25 mEq/L (ref 19–32)
Chloride: 106 mEq/L (ref 96–112)
Creatinine, Ser: 1.4 mg/dL (ref 0.4–1.5)
Glucose, Bld: 109 mg/dL — ABNORMAL HIGH (ref 70–99)
Potassium: 4.9 mEq/L (ref 3.5–5.1)

## 2011-01-16 LAB — APTT: aPTT: 35 seconds (ref 24–37)

## 2011-01-16 LAB — CK TOTAL AND CKMB (NOT AT ARMC)
CK, MB: 2.1 ng/mL (ref 0.3–4.0)
Total CK: 69 U/L (ref 7–232)

## 2011-01-16 LAB — URINALYSIS, ROUTINE W REFLEX MICROSCOPIC
Bilirubin Urine: NEGATIVE
Glucose, UA: NEGATIVE mg/dL
Hgb urine dipstick: NEGATIVE
Ketones, ur: NEGATIVE mg/dL
Nitrite: NEGATIVE
Specific Gravity, Urine: 1.015 (ref 1.005–1.030)
pH: 7 (ref 5.0–8.0)

## 2011-01-16 LAB — COMPREHENSIVE METABOLIC PANEL
ALT: 15 U/L (ref 0–53)
BUN: 16 mg/dL (ref 6–23)
CO2: 23 mEq/L (ref 19–32)
Calcium: 8.6 mg/dL (ref 8.4–10.5)
Creatinine, Ser: 1.34 mg/dL (ref 0.4–1.5)
GFR calc non Af Amer: 51 mL/min — ABNORMAL LOW (ref >60)
Glucose, Bld: 174 mg/dL — ABNORMAL HIGH (ref 70–99)
Sodium: 137 mEq/L (ref 135–145)

## 2011-01-16 LAB — CBC
HCT: 43.6 % (ref 39.0–52.0)
MCH: 29.3 pg (ref 26.0–34.0)
MCHC: 34.2 g/dL (ref 30.0–36.0)
MCV: 85.8 fL (ref 78.0–100.0)
Platelets: 198 10*3/uL (ref 150–400)
RDW: 14.1 % (ref 11.5–15.5)

## 2011-01-16 LAB — POCT TOXICOLOGY PANEL

## 2011-01-16 LAB — HEMOGLOBIN A1C: Hgb A1c MFr Bld: 6.4 % — ABNORMAL HIGH (ref <5.7)

## 2011-02-16 ENCOUNTER — Ambulatory Visit: Payer: Self-pay | Admitting: Internal Medicine

## 2011-02-21 ENCOUNTER — Encounter: Payer: Self-pay | Admitting: Internal Medicine

## 2011-02-21 ENCOUNTER — Ambulatory Visit (INDEPENDENT_AMBULATORY_CARE_PROVIDER_SITE_OTHER): Payer: Medicare Other | Admitting: Internal Medicine

## 2011-02-21 DIAGNOSIS — IMO0001 Reserved for inherently not codable concepts without codable children: Secondary | ICD-10-CM

## 2011-02-21 DIAGNOSIS — I10 Essential (primary) hypertension: Secondary | ICD-10-CM

## 2011-02-21 DIAGNOSIS — R279 Unspecified lack of coordination: Secondary | ICD-10-CM

## 2011-02-21 DIAGNOSIS — R7309 Other abnormal glucose: Secondary | ICD-10-CM

## 2011-02-21 DIAGNOSIS — M791 Myalgia, unspecified site: Secondary | ICD-10-CM

## 2011-02-21 NOTE — Patient Instructions (Signed)
Please complete blood work before your next follow up appointment BMET, A1c - 790.29 Try to avoid sweets and sugary beverages Limit your intake of carbohydrates (breads, potatoes, rice, etc)

## 2011-02-24 LAB — HEMOGLOBIN A1C: Mean Plasma Glucose: 134 mg/dL — ABNORMAL HIGH (ref <117)

## 2011-02-24 LAB — BASIC METABOLIC PANEL WITH GFR
Chloride: 103 mEq/L (ref 96–112)
GFR, Est African American: >60 mL/min (ref >60)
GFR, Est Non African American: 52 mL/min — ABNORMAL LOW (ref >60)
Glucose, Bld: 115 mg/dL — ABNORMAL HIGH (ref 70–99)
Potassium: 4.8 mEq/L (ref 3.5–5.3)
Sodium: 140 mEq/L (ref 135–145)

## 2011-02-24 LAB — SEDIMENTATION RATE: Sed Rate: 4 mm/hr (ref 0–16)

## 2011-02-26 NOTE — Progress Notes (Signed)
Subjective:    Patient ID: Troy Ray, male    DOB: February 27, 1928, 77 y.o.   MRN: [id number]  HPI  77 year old white male for followup. Patient previously seen for hospital followup re: possible TIA.  MRI of brain was negative for acute stroke. Patient denies recurrent symptoms. He has intermittent mild dizziness with standing but denies syncope. He complains of bilateral shoulder pain since starting Plavix. He denies abnormal bleeding or bruising.   Review of Systems  No chest pain or shortness of breath Past Medical History  Diagnosis Date  . CAD (coronary artery disease)   . Hypertension   . Chronic kidney disease     chronic, stage II  . Hyperlipidemia   . PVD (peripheral vascular disease)   . Postural lightheadedness   . Memory loss   . Dysphagia   . Esophageal stricture     Dr [name]  . CRI (chronic renal insufficiency)   . Sensorineural hearing loss, bilateral   . Carotid stenosis, bilateral     right 60-79% stenosed.  Left 40-59%  . Adenomatous colon polyp   . Diverticulosis     History   Social History  . Marital Status: Married    Spouse Name: N/A    Number of Children: 3  . Years of Education: N/A   Occupational History  . MEDIA ASSISTANT    Social History Main Topics  . Smoking status: Former Games developer  . Smokeless tobacco: Not on file  . Alcohol Use: Yes  . Drug Use:   . Sexually Active:    Other Topics Concern  . Not on file   Social History Narrative  . No narrative on file    Past Surgical History  Procedure Date  . Hernia repair 03/23/05    left inguinal Lichtenstein repair, with mesh.  Claud Kelp MD  . Abdominal aortic aneurysm repair 1996    Dr [name]  . Renal artery stent 1996    Dr [name].  Bilateral stenting    Family History  Problem Relation Age of Onset  . Heart disease Father     CAD  . Cancer Neg Hx   . Diabetes Neg Hx     No Known Allergies  Current Outpatient Prescriptions on File Prior to Visit  Medication  Sig Dispense Refill  . esomeprazole (NEXIUM) 40 MG capsule Take 40 mg by mouth every morning before breakfast.        . finasteride (PROSCAR) 5 MG tablet Take 5 mg by mouth daily.        . metoprolol (TOPROL-XL) 25 MG 24 hr tablet Take 25 mg by mouth daily.        [name] Kitchen olmesartan (BENICAR) 20 MG tablet Take 20 mg by mouth daily.        . rosuvastatin (CRESTOR) 20 MG tablet Take 20 mg by mouth daily.          BP 100/70  Pulse 55  Temp(Src) 97.5 F (36.4 C) (Oral)  Resp 16  Ht 5\' 10"  (1.778 m)  Wt 184 lb (83.462 kg)  BMI 26.40 kg/m2  SpO2 98%       Objective:   Physical Exam  Constitutional: He is oriented to person, place, and time. He appears well-developed and well-nourished. No distress.  HENT:  Head: Normocephalic and atraumatic.  Eyes: EOM are normal.  Neck: Neck supple.       No carotid bruit  Cardiovascular: Normal rate and regular rhythm.  Exam reveals no gallop and no friction  rub.   Pulmonary/Chest: Effort normal and breath sounds normal.  Neurological: He is oriented to person, place, and time. No cranial nerve deficit. Coordination normal.  Psychiatric: He has a normal mood and affect. His behavior is normal.          Assessment & Plan:

## 2011-02-26 NOTE — Assessment & Plan Note (Signed)
Stable.  Continue current medication regimen.  BP: 100/70 mmHg

## 2011-02-26 NOTE — Assessment & Plan Note (Signed)
Patient has not had any further recurrences of gait ataxia. I doubt that his previous spell was related to TIA.  Plavix possibly causing bilateral shoulder pain. Discontinue Plavix Resume aspirin therapy

## 2011-02-26 NOTE — Assessment & Plan Note (Signed)
Reiterated importance of avoiding concentrated sweets and decreasing intake of carbohydrates. Monitor A1c.  Lab Results  Component Value Date   HGBA1C 6.3* 02/21/2011

## 2011-03-06 ENCOUNTER — Telehealth (INDEPENDENT_AMBULATORY_CARE_PROVIDER_SITE_OTHER): Payer: Medicare Other | Admitting: Internal Medicine

## 2011-03-06 DIAGNOSIS — E785 Hyperlipidemia, unspecified: Secondary | ICD-10-CM

## 2011-03-06 NOTE — Telephone Encounter (Signed)
Refill- crestor 20mg  tablet. Take 1 tablet by mouth once a day. Qty 90. Last fill 1.24.12

## 2011-03-07 MED ORDER — ROSUVASTATIN CALCIUM 20 MG PO TABS: 20.0000 mg | ORAL_TABLET | Freq: Every day | ORAL | Status: DC

## 2011-03-07 NOTE — Telephone Encounter (Signed)
Rx refill sent to pharmacy. 

## 2011-03-16 ENCOUNTER — Ambulatory Visit (INDEPENDENT_AMBULATORY_CARE_PROVIDER_SITE_OTHER): Payer: Medicare Other | Admitting: Cardiology

## 2011-03-16 ENCOUNTER — Encounter: Payer: Self-pay | Admitting: Cardiology

## 2011-03-16 VITALS — BP 126/86 | HR 54 | Resp 18 | Ht 70.0 in | Wt 183.8 lb

## 2011-03-16 DIAGNOSIS — I739 Peripheral vascular disease, unspecified: Secondary | ICD-10-CM

## 2011-03-16 DIAGNOSIS — I6529 Occlusion and stenosis of unspecified carotid artery: Secondary | ICD-10-CM

## 2011-03-16 DIAGNOSIS — I2510 Atherosclerotic heart disease of native coronary artery without angina pectoris: Secondary | ICD-10-CM

## 2011-03-16 NOTE — Assessment & Plan Note (Signed)
Stable, no change in medical therapy. 

## 2011-03-16 NOTE — Assessment & Plan Note (Signed)
Stable, no change in medical therapy.

## 2011-03-16 NOTE — Progress Notes (Signed)
   Patient ID: Troy Ray, male    DOB: 09-Jan-1928, 77 y.o.   MRN: [id number]  HPI Troy Ray returns today for E and M of his CAD, CVD, and PVD. Other than some mild DOE, he offers no complaints. He denies any sxs of TIA'S or claudication. Recent ECHO showed normal EF with mild diastolic dysfunction. Carotid dopplers in 8/11 were stable. He is compliant with his meds.  EKG today shows SB, otherwise normal.   Review of Systems  All other systems reviewed and are negative.      Physical Exam  Nursing note and vitals reviewed. Constitutional: He is oriented to person, place, and time. He appears well-developed and well-nourished. No distress.  HENT:  Head: Normocephalic and atraumatic.  Eyes: EOM are normal.  Neck: Neck supple. No JVD present. No tracheal deviation present. No thyromegaly present.  Cardiovascular: Normal rate, regular rhythm, S1 normal and S2 normal.   No extrasystoles are present. PMI is not displaced.  Exam reveals no gallop, no S3, no S4 and no decreased pulses.     No systolic murmur is present   No diastolic murmur is present  Pulses:      Carotid pulses are on the right side with bruit. Pulmonary/Chest: Effort normal and breath sounds normal.  Abdominal: Soft. He exhibits no distension. There is no tenderness.       No bruit  Musculoskeletal: Normal range of motion. He exhibits no edema.  Neurological: He is alert and oriented to person, place, and time.       HOH  Skin: Skin is warm and dry.  Psychiatric: He has a normal mood and affect.

## 2011-03-16 NOTE — Assessment & Plan Note (Signed)
Stable, recheck in 8/12.

## 2011-03-16 NOTE — Patient Instructions (Signed)
Your physician recommends that you schedule a follow-up appointment in: 1 year with Dr. Wall  

## 2011-03-20 ENCOUNTER — Ambulatory Visit: Payer: Self-pay | Admitting: Internal Medicine

## 2011-03-21 NOTE — Assessment & Plan Note (Signed)
[facility]                         GASTROENTEROLOGY OFFICE NOTE   NAME:Ray, Troy KYNARD                       MRN:          [id number]  DATE:05/16/2007                            DOB:          [birth year]    PROBLEM:  Esophageal stricture.   REASON FOR VISIT:  Troy Ray has returned for re-evaluation.  He  underwent emergency endoscopy for a food impaction on June 17th.  He has  a distal esophageal stricture.  Since that time he has done well though  he continues to complain of dysphagia to solids.  He remains on  Prilosec.   OTHER MEDICATIONS:  Baby aspirin, Toprol, Benicar, Zocor, and Avodart.   He has no allergies.   PHYSICAL EXAMINATION:  VITAL SIGNS:  Pulse 60, blood pressure 120/76,  weight 191.  HEENT: EOMI. PERRLA. Sclerae are anicteric.  Conjunctivae are pink.  NECK:  Supple without thyromegaly, adenopathy or carotid bruits.  CHEST:  Clear to auscultation and percussion without adventitious  sounds.  CARDIAC:  Regular rhythm; normal S1 S2.  There are no murmurs, gallops  or rubs.  ABDOMEN:  He has a small umbilical hernia.  Bowel sounds are  normoactive.  Abdomen is soft, non-tender and non-distended.  There are  no abdominal masses, tenderness, splenic enlargement or hepatomegaly.  EXTREMITIES:  Full range of motion.  No cyanosis, clubbing or edema.  RECTAL:  Deferred.   IMPRESSION:  Symptomatic esophageal stricture.   RECOMMENDATIONS:  EGD with balloon dilatation.     Barbette Hair. [name], MD,FACG  Electronically Signed    RDK/MedQ  DD: 05/16/2007  DT: 05/16/2007  Job #: [id number]   cc:   Quita Skye. Artis Flock, M.D.

## 2011-03-21 NOTE — Assessment & Plan Note (Signed)
[facility]                         GASTROENTEROLOGY OFFICE NOTE   NAME:Stupka, DJUAN TALTON                       MRN:          [id number]  DATE:07/19/2007                            DOB:          [birth year]    CANCELLED DICTATION     Molly Maduro D. [name], MD,FACG  Electronically Signed    RDK/MedQ  DD: 07/19/2007  DT: 07/20/2007  Job #: [id number]

## 2011-03-21 NOTE — Assessment & Plan Note (Signed)
[facility]                         GASTROENTEROLOGY OFFICE NOTE   NAME:Vanderbeck, KOLEMAN MARLING                       MRN:          [id number]  DATE:09/23/2007                            DOB:          Nov 28, 1927    PROBLEM:  Esophageal stricture.   Mr. Nester has returned following repeat dilatation of his distal  esophageal stricture.  On October 13th, he was dilated to 18 mm.  He  currently feels well.  He has very occasional dysphagia.  He has peptic  esophageal stricture that does require periodic dilatation.  He remains  on Nexium.   EXAMINATION:  VITAL SIGNS:  Pulse 62, blood pressure 132/70, weight 192.   IMPRESSION:  Recurrent distal esophageal stricture.   RECOMMENDATIONS:  1. Continue Nexium.  2. Repeat dilatation as needed.     Barbette Hair. [name], MD,FACG  Electronically Signed    RDK/MedQ  DD: 09/23/2007  DT: 09/23/2007  Job #: [id number]   cc:   Quita Skye. Artis Flock, M.D.

## 2011-03-21 NOTE — Assessment & Plan Note (Signed)
[facility]                         GASTROENTEROLOGY OFFICE NOTE   NAME:Vines, JAHON BART                       MRN:          [id number]  DATE:07/19/2007                            DOB:          10/15/1928    PROBLEM:  Dysphagia.   Mr. Kotlyar has returned, still complaining of dysphagia.  He has a  distal esophageal stricture, which was dilated to 18 mm in July.  He  partially improved from this procedure, though he continues to complain  of dysphagia.  He previously had a food impaction.   EXAM:  Pulse 60, blood pressure 128/76, weight 189.   IMPRESSION:  Persistent dysphagia secondary to esophageal stricture.   RECOMMENDATIONS:  Repeat dilatation with balloon dilator to 18-19 mm.     Barbette Hair. [name], MD,FACG  Electronically Signed    RDK/MedQ  DD: 07/19/2007  DT: 07/20/2007  Job #: [id number]

## 2011-03-21 NOTE — Assessment & Plan Note (Signed)
 [facility]                         GASTROENTEROLOGY OFFICE NOTE   NAME:Jasinski, DEVLIN BRINK                       MRN:          [id number]  DATE:12/02/2007                            DOB:          [birth year]    PROBLEM:  Dysphagia.   Mr. Claiborne has returned again complaining of dysphagia.  He has had  several episodes of food impaction over the few weeks, which he was able  to clear by vomiting.  He complains of dysphagia to solids.  His last  dilatation was mid October.  He remains on Nexium.   PHYSICAL EXAMINATION:  VITAL SIGNS:  Pulse 60, blood pressure 120/60,  weight 185.   IMPRESSION:  Recurrent esophageal stricture.   RECOMMENDATIONS:  Repeat endoscopy with balloon dilatation.     Barbette Hair. [name], MD,FACG  Electronically Signed    RDK/MedQ  DD: 12/02/2007  DT: 12/02/2007  Job #: [id number]

## 2011-03-23 ENCOUNTER — Other Ambulatory Visit: Payer: Self-pay | Admitting: Internal Medicine

## 2011-03-24 NOTE — Assessment & Plan Note (Signed)
[facility]                            CARDIOLOGY OFFICE NOTE   NAME:Troy Ray, Troy Ray                       MRN:          [id number]  DATE:02/13/2007                            DOB:          02/17/1928    Mr. Limones returns today for 61-month followup of his carotid stenosis.  Preliminary report shows slight progression in the right internal  carotid, now 60% to 79% stenosed.  He has a stable left internal carotid  artery at 40% to 59%.  He has antegrade flow in both vertebrals.   His other problems are:  1. Coronary artery disease status post 3-vessel coronary artery bypass      grafting in 2000.  He has normal left ventricular function.  He      will be due a stress Myoview in the fall of this year.  2. He also has peripheral vascular disease, and has had bilateral      renal artery stenting and abdominal aortic aneurysm repair in 1996      by Josephina Gip.   Dr. Artis Flock follows his hypertension and hyperlipidemia.   He has no complaints except he says he is lazy.  He needs to start  walking.  He does work at [employer], and climbs some steps.   His meds are:  1. Aspirin 81 mg a day.  2. Toprol XL 25 mg a day.  3. Benicar hydrochlorothiazide 20/12.5 daily.  4. Zocor 40 mg a day.  5. Avodart 0.5 mg daily.   His blood pressure is 140/77.  Pulse 70 and regular.  His weight is 190.  He is in no acute distress.  HEENT:  Normocephalic and atraumatic.  PERRLA.  Extraocular movements  intact.  Sclerae are clear.  Facial symmetry is normal.  Carotids are full with bilateral bruits.  There is no JVD.  Thyroid is  not enlarged.  Trachea is midline.  LUNGS:  Clear.  HEART:  Reveals a nondisplaced PMI.  There is a soft S1 and S2.  ABDOMINAL EXAM:  Soft with no midline bruit.  There is no pulsatile  mass.  There is no hepatomegaly.  EXTREMITIES:  Reveal no edema.  Pulses were 1+/4+, bilaterally  symmetrical.  NEUROLOGIC:  Exam is intact.   ASSESSMENT  AND PLAN:  Mr. Tritschler is doing well.  I am concerned about  the progression in his carotids again after 6 months of followup.   PLAN:  1. Aggressive risk factor modification per Dr. Artis Flock.  2. Schedule followup in September for carotid Dopplers and abdominal      ultrasound, which he is overdue for, and stress adenosine Myoview.     Thomas C. [name], MD, [facility]  Electronically Signed    TCW/MedQ  DD: 02/13/2007  DT: 02/13/2007  Job #: [id number]   cc:   Quita Skye. Artis Flock, M.D.

## 2011-03-24 NOTE — Letter (Signed)
August 27, 2006     Quita Skye. Artis Flock, M.D.  [address], [address]  [place], [place] [address]   RE:  [name], [name]  MRN:  [id number]  /  DOB:  1928-11-02   Dear Dr. Artis Flock:   Upon your kind referral, I had the pleasure of evaluating your patient and I  am pleased to offer my findings. I saw Troy Ray in the office today.  Enclosed is a copy of my progress note that details my findings and  recommendations.   Thank you for the opportunity to participate in your patient's care.    Sincerely,      Barbette Hair. [name], MD,FACG    RDK/MedQ  DD: 08/27/2006  DT: 08/27/2006  Job #: [id number]

## 2011-03-24 NOTE — Assessment & Plan Note (Signed)
[facility]                           GASTROENTEROLOGY OFFICE NOTE   NAME:Troy Ray                       MRN:          [id number]  DATE:08/27/2006                            DOB:          August 18, 1928    REASON FOR CONSULTATION:  Colorectal cancer screening.   Troy Ray is a pleasant 77 year old white male referred through the  courtesy of Dr. Artis Flock for evaluation.  His only GI complaint is mild  constipation.  This has been a chronic problem.  He denies change in bowel  habits, abdominal pain, melena, or hematochezia.   PAST MEDICAL HISTORY:  Pertinent for coronary artery disease.  He is status  post CABG.  He underwent an aortic aneurysmal repair.  He has hypertension.   FAMILY HISTORY:  Pertinent for father who has heart disease.   MEDICATIONS:  Include baby aspirin, Toprol XL, Benicar, Zocor, and Avodart.   He has no allergies.   He neither smokes nor drinks.  He is married and retired.   REVIEW OF SYSTEMS:  Positive for urinary frequency and fatigue, and loss of  hearing.   EXAM:  Pulse 60, blood pressure 130/70, weight 186.  HEENT:  EOMI. PERRLA. Sclerae are anicteric.  Conjunctivae are pink.  NECK:  Supple without thyromegaly, adenopathy or carotid bruits.  CHEST:  Clear to auscultation and percussion without adventitious sounds.  CARDIAC:  Regular rhythm; normal S1 S2.  There are no murmurs, gallops or  rubs.  ABDOMEN:  Bowel sounds are normoactive.  Abdomen is soft, non-tender and non-  distended.  There are no abdominal masses, tenderness, splenic enlargement  or hepatomegaly.  EXTREMITIES:  Full range of motion.  No cyanosis, clubbing or edema.  RECTAL:  Deferred.   IMPRESSION:  1. Constipation.  2. Coronary artery disease and atherosclerotic cardiovascular disease.   RECOMMENDATIONS:  Screening colonoscopy.       Troy Ray. [name], MD,FACG      RDK/MedQ  DD:  08/27/2006  DT:  08/27/2006  Job #:  [id number]   cc:   Troy Ray. Artis Flock, M.D.

## 2011-03-24 NOTE — Op Note (Signed)
[name], [name]                ACCOUNT NO.:  [id number]   MEDICAL RECORD NO.:  [id number]          PATIENT TYPE:  AMB   LOCATION:  DSC                          FACILITY:  [facility]   PHYSICIAN:  Angelia Mould. [name], M.D.DATE OF BIRTH:  13-Nov-1927   DATE OF PROCEDURE:  03/23/2005  DATE OF DISCHARGE:                                 OPERATIVE REPORT   PREOPERATIVE DIAGNOSIS:  Left inguinal hernia.   POSTOPERATIVE DIAGNOSIS:  Left inguinal hernia.   OPERATION PERFORMED:  Repair of left inguinal hernia with mesh Armanda Heritage  repair).   SURGEON:  Angelia Mould. [name], M.D.   OPERATIVE INDICATIONS:  This is a 77 year old white man who has had a  painful bulge in his left groin for 2-3 months.  He has never had a hernia  problem in the past.  On exam, he has a moderately large left inguinal  hernia which is reducible.  He does not have any evidence of hernia  elsewhere in the abdominal wall.  He is brought to operating room electively  for repair of his left inguinal hernia.   OPERATIVE TECHNIQUE:  Following the administration of a general LMA  anesthesia, the patient's lower abdomen, genitalia and left groin were  prepped and draped in a sterile fashion.  Intravenous antibiotics were  given.  The patient was identified.  0.5% Marcaine with epinephrine was used  as a local-infiltration anesthetic.  An oblique incision was made in the  left groin overlying the inguinal canal.  Dissection was carried down  through the subcutaneous tissue, exposing the aponeurosis of the external  oblique.  The external oblique was incised in the direction of its fibers,  opening up the external inguinal ring.  The external oblique was dissected  away from the underlying muscle layers and self-retaining retractors were  placed.  The cord structures were mobilized and encircled with a Penrose  drain.  Cremasteric muscle fibers were skeletonized using electrocautery.  The patient had a lipoma associated with  the cord which was removed with  electrocautery.  I found that he had a large indirect hernia sac, but really  no evidence of direct hernia.  The indirect hernia sac was dissected away  from cord structures all the way back to level of the internal ring.  The  indirect hernia sac was opened and inspected, and was found to be empty  without any incarcerated contents.  The indirect sac was then twisted and  suture-ligated at the level of the internal ring with suture ligature of 2-0  silk.  The redundant sac was excised and discarded.  The floor of the  inguinal canal was repaired and reinforced with an onlay graft of  polypropylene mesh.  A 3-inch x 6-inch piece of mesh was brought to  operative field, trimmed at the corners to fit the wound and sutured in  place with interrupted and running sutures of 2-0 Prolene.  The mesh was  sutured so as to generously overlap the fascia at the pubic tubercle  medially with some interrupted sutures.  A running suture of 2-0 Prolene was  then  placed to affix the mesh along the inguinal ligament inferiorly.  A  running suture of 2-0 Prolene was placed superiorly and superolaterally to  affix the mesh to the internal oblique and conjoined tendon superiorly.  The  mesh was incised laterally so as to wrap around the cord structures at the  level of the internal ring.  The tails of the mesh were overlapped laterally  and the suture lines were completed and tied.  One extra suture of a 2-0  Prolene was placed laterally to secure the mesh just lateral to the cord  structures.  This provided an adequate opening for the cord structures, but  provided a very secure repair both medial and lateral to the internal ring.  The wound was irrigated with saline.  There was no bleeding.  The external  oblique was closed with a running suture of 2-0 Vicryl, placing the cord  structures deep to the external oblique.  Scarpa  fascia was closed with 3-0 Vicryl sutures and the  skin closed with running  subcuticular suture of 4-0 Vicryl and Steri-Strips.  Clean bandages were  placed and the patient taken to the recovery room in stable condition.  Estimated blood loss was about 10  mL or less.  Complications -- none.  Sponge, needle and instrument counts were correct.      HMI/MEDQ  D:  03/23/2005  T:  03/23/2005  Job:  [id number]   cc:   Quita Skye. Artis Flock, M.D.  [address]  [place]  [place] [address]  Fax: [phone]   Jesse Sans. Wall, M.D.

## 2011-03-28 ENCOUNTER — Ambulatory Visit: Payer: Self-pay | Admitting: Internal Medicine

## 2011-04-06 ENCOUNTER — Ambulatory Visit: Payer: Medicare Other | Admitting: Internal Medicine

## 2011-04-14 ENCOUNTER — Telehealth: Payer: Self-pay | Admitting: Internal Medicine

## 2011-04-14 NOTE — Telephone Encounter (Signed)
Reviewed last office notes and labs, things appeared stable at that time so if patient has no acute c/o then he is OK to wait til July

## 2011-04-14 NOTE — Telephone Encounter (Signed)
Patient refuses to see another doctor. Original appt was on 03-20-11, rescheduled to 03-28-11, 04-06-11,04-21-11, again to 05-18-11.

## 2011-04-21 ENCOUNTER — Ambulatory Visit: Payer: Medicare Other | Admitting: Internal Medicine

## 2011-05-09 ENCOUNTER — Telehealth: Payer: Self-pay | Admitting: Internal Medicine

## 2011-05-09 MED ORDER — ESOMEPRAZOLE MAGNESIUM 40 MG PO CPDR: 40.0000 mg | DELAYED_RELEASE_CAPSULE | Freq: Every day | ORAL | Status: DC

## 2011-05-09 NOTE — Telephone Encounter (Signed)
Rx refill sent to pharmacy. 

## 2011-05-09 NOTE — Telephone Encounter (Signed)
Refill- nexium dr 40mg  capsule. Take one capsule by mouth every day. Qty 90. Last fill 4.3.12

## 2011-05-18 ENCOUNTER — Ambulatory Visit: Payer: Medicare Other | Admitting: Internal Medicine

## 2011-05-18 ENCOUNTER — Encounter: Payer: Medicare Other | Admitting: Internal Medicine

## 2011-06-01 ENCOUNTER — Telehealth: Payer: Self-pay | Admitting: Internal Medicine

## 2011-06-01 MED ORDER — METOPROLOL SUCCINATE ER 25 MG PO TB24: 25.0000 mg | ORAL_TABLET | Freq: Every day | ORAL | Status: DC

## 2011-06-01 NOTE — Telephone Encounter (Signed)
Refill- metoprolol succ er 25mg  tab. Take one tablet by mouth every day. Qty 90. Last fill 4.25.12

## 2011-06-01 NOTE — Telephone Encounter (Signed)
Rx refill sent to pharmacy. 

## 2011-06-19 ENCOUNTER — Telehealth: Payer: Self-pay | Admitting: Internal Medicine

## 2011-06-19 MED ORDER — OLMESARTAN MEDOXOMIL 20 MG PO TABS: 20.0000 mg | ORAL_TABLET | Freq: Every day | ORAL | Status: DC

## 2011-06-19 NOTE — Telephone Encounter (Signed)
Rx refill sent to pharmacy. 

## 2011-06-19 NOTE — Telephone Encounter (Signed)
Refill- benicar 20mg  tablet. Take one tablet by mouth every day. Qty 90. Last fill 4.28.12

## 2011-08-16 ENCOUNTER — Other Ambulatory Visit: Payer: Self-pay | Admitting: Internal Medicine

## 2011-08-16 ENCOUNTER — Encounter: Payer: Self-pay | Admitting: Gastroenterology

## 2011-08-16 DIAGNOSIS — I10 Essential (primary) hypertension: Secondary | ICD-10-CM

## 2011-08-16 DIAGNOSIS — E785 Hyperlipidemia, unspecified: Secondary | ICD-10-CM

## 2011-08-16 DIAGNOSIS — R7309 Other abnormal glucose: Secondary | ICD-10-CM

## 2011-08-16 LAB — CBC
HCT: 46.9 % (ref 39.0–52.0)
Hemoglobin: 15 g/dL (ref 13.0–17.0)
RBC: 5.07 MIL/uL (ref 4.22–5.81)
WBC: 10.2 10*3/uL (ref 4.0–10.5)

## 2011-08-16 LAB — LIPID PANEL
HDL: 37 mg/dL — ABNORMAL LOW (ref >39)
LDL Cholesterol: 77 mg/dL (ref 0–99)
Total CHOL/HDL Ratio: 4 Ratio
Triglycerides: 171 mg/dL — ABNORMAL HIGH (ref <150)
VLDL: 34 mg/dL (ref 0–40)

## 2011-08-16 LAB — HEPATIC FUNCTION PANEL
Albumin: 4.5 g/dL (ref 3.5–5.2)
Total Bilirubin: 0.5 mg/dL (ref 0.3–1.2)

## 2011-08-16 LAB — BASIC METABOLIC PANEL
Calcium: 9.7 mg/dL (ref 8.4–10.5)
Creat: 1.59 mg/dL — ABNORMAL HIGH (ref 0.50–1.35)
Sodium: 137 mEq/L (ref 135–145)

## 2011-08-17 LAB — HEMOGLOBIN A1C: Mean Plasma Glucose: 128 mg/dL — ABNORMAL HIGH (ref <117)

## 2011-08-23 ENCOUNTER — Telehealth: Payer: Self-pay | Admitting: Internal Medicine

## 2011-08-23 ENCOUNTER — Ambulatory Visit (INDEPENDENT_AMBULATORY_CARE_PROVIDER_SITE_OTHER): Payer: Medicare Other | Admitting: Internal Medicine

## 2011-08-23 ENCOUNTER — Encounter: Payer: Self-pay | Admitting: Internal Medicine

## 2011-08-23 ENCOUNTER — Ambulatory Visit (HOSPITAL_BASED_OUTPATIENT_CLINIC_OR_DEPARTMENT_OTHER)
Admission: RE | Admit: 2011-08-23 | Discharge: 2011-08-23 | Disposition: A | Discharge status: Home / Self Care | Payer: Medicare Other | Source: Ambulatory Visit | Attending: Internal Medicine | Admitting: Internal Medicine

## 2011-08-23 VITALS — BP 110/80 | HR 60 | Temp 98.0°F | Resp 18 | Wt 179.0 lb

## 2011-08-23 DIAGNOSIS — R7309 Other abnormal glucose: Secondary | ICD-10-CM

## 2011-08-23 DIAGNOSIS — R142 Eructation: Secondary | ICD-10-CM | POA: Insufficient documentation

## 2011-08-23 DIAGNOSIS — R109 Unspecified abdominal pain: Secondary | ICD-10-CM | POA: Insufficient documentation

## 2011-08-23 DIAGNOSIS — R141 Gas pain: Secondary | ICD-10-CM | POA: Insufficient documentation

## 2011-08-23 DIAGNOSIS — E785 Hyperlipidemia, unspecified: Secondary | ICD-10-CM

## 2011-08-23 DIAGNOSIS — K5900 Constipation, unspecified: Secondary | ICD-10-CM | POA: Insufficient documentation

## 2011-08-23 DIAGNOSIS — I10 Essential (primary) hypertension: Secondary | ICD-10-CM

## 2011-08-23 DIAGNOSIS — R143 Flatulence: Secondary | ICD-10-CM | POA: Insufficient documentation

## 2011-08-23 NOTE — Telephone Encounter (Signed)
Lab orders entered for [place] for April 2013. 

## 2011-08-23 NOTE — Patient Instructions (Addendum)
Please try benefiber daily for constipation. Schedule lipid/lft 272.4, chem7 (cri) and a1c (hyperglycemia) prior to next visit

## 2011-08-24 NOTE — Progress Notes (Signed)
  Subjective:    Patient ID: Troy Ray, male    DOB: [birth year], 77 y.o.   MRN: [id number]  HPI Pt presents to clinic for followup of multiple medical problems.  Has intermittent constipation with occasional overflow/loose bowel movement. No incontinence or blood in stool. BP reviewed as normotensive. Tolerates statin tx without myalgias or abn lfts. Hyperglycemia improved with a1c 6.1 down from 6.3. Renal fxn stable with h/o cri. No clinical evidence of volume overload. No other complaints.  Past Medical History  Diagnosis Date  . CAD (coronary artery disease)   . Hypertension   . Chronic kidney disease     chronic, stage II  . Hyperlipidemia   . PVD (peripheral vascular disease)   . Postural lightheadedness   . Memory loss   . Dysphagia   . Esophageal stricture     Dr [name]  . CRI (chronic renal insufficiency)   . Sensorineural hearing loss, bilateral   . Carotid stenosis, bilateral     right 60-79% stenosed.  Left 40-59%  . Adenomatous colon polyp   . Diverticulosis    Past Surgical History  Procedure Date  . Hernia repair 03/23/05    left inguinal Lichtenstein repair, with mesh.  Claud Kelp MD  . Abdominal aortic aneurysm repair 1996    Dr [name]  . Renal artery stent 1996    Dr [name].  Bilateral stenting    reports that he has quit smoking. He has never used smokeless tobacco. He reports that he drinks alcohol. His drug history not on file. family history includes Heart disease in his father.  There is no history of Cancer and Diabetes. No Known Allergies   Review of Systems see hpi     Objective:   Physical Exam  Nursing note and vitals reviewed. Constitutional: He appears well-developed and well-nourished. No distress.  HENT:  Head: Normocephalic and atraumatic.  Eyes: Conjunctivae are normal. No scleral icterus.  Neck: Neck supple. No JVD present.  Cardiovascular: Normal rate, regular rhythm and normal heart sounds.   Pulmonary/Chest: Effort normal  and breath sounds normal.  Abdominal: Soft. Bowel sounds are normal. He exhibits no distension and no mass. There is no hepatosplenomegaly. There is tenderness in the right lower quadrant. There is no rigidity, no rebound, no guarding and no tenderness at McBurney's point.       Mild tenderness along right colonic gutter  Skin: He is not diaphoretic.          Assessment & Plan:

## 2011-08-27 DIAGNOSIS — R109 Unspecified abdominal pain: Secondary | ICD-10-CM | POA: Insufficient documentation

## 2011-08-27 NOTE — Assessment & Plan Note (Signed)
With associated constipation. Obtain plain radiograph of abdomen. Begin benefiber. Followup if no improvement or worsening.

## 2011-08-30 ENCOUNTER — Telehealth: Payer: Self-pay | Admitting: Internal Medicine

## 2011-08-30 DIAGNOSIS — E785 Hyperlipidemia, unspecified: Secondary | ICD-10-CM

## 2011-08-30 MED ORDER — ROSUVASTATIN CALCIUM 20 MG PO TABS: 20.0000 mg | ORAL_TABLET | Freq: Every day | ORAL | Status: DC

## 2011-08-30 NOTE — Telephone Encounter (Signed)
Rx refill sent to pharmacy. 

## 2011-08-30 NOTE — Telephone Encounter (Signed)
Refill- crestor 20mg  tablet. Take one tablet daily. Qty 90. Last fill 7.25.12

## 2011-09-04 ENCOUNTER — Telehealth: Payer: Self-pay | Admitting: Gastroenterology

## 2011-09-04 NOTE — Telephone Encounter (Signed)
Pt scheduled to see Dr. [name] 09/18/11@10 :30am. Pt aware of appt date and time.

## 2011-09-18 ENCOUNTER — Encounter: Payer: Self-pay | Admitting: Gastroenterology

## 2011-09-18 ENCOUNTER — Ambulatory Visit (INDEPENDENT_AMBULATORY_CARE_PROVIDER_SITE_OTHER): Payer: Medicare Other | Admitting: Gastroenterology

## 2011-09-18 VITALS — BP 110/60 | HR 76 | Ht 70.0 in | Wt 183.0 lb

## 2011-09-18 DIAGNOSIS — R109 Unspecified abdominal pain: Secondary | ICD-10-CM

## 2011-09-18 MED ORDER — HYOSCYAMINE SULFATE ER 0.375 MG PO TBCR: EXTENDED_RELEASE_TABLET | ORAL | Status: DC

## 2011-09-18 NOTE — Patient Instructions (Signed)
Follow up as needed

## 2011-09-18 NOTE — Assessment & Plan Note (Signed)
Pain is nonspecific. It is doubtful that he had cholecystitis or diverticulitis.  Recommendations #1 trial of hyomax 0.375 mg twice a day as needed for pain. The patient was carefully instructed to contact me if he has recurrent pain that does not respond to this medicine.

## 2011-09-18 NOTE — Progress Notes (Signed)
Troy Ray is a pleasant 77 year old white male with coronary artery disease, hypertension, chronic kidney disease, esophageal stricture and peripheral vascular disease referred for evaluation of abdominal pain. Approximately 2 weeks ago he was complaining of left periumbilical pain that radiated across his upper abdomen into the right upper quadrant. It was unaffected by eating or bowel movements. He denied fever. He had some relief with lying down. Pain was aching and is of low to moderate in intensity. He's had similar episodes in the past that were shorter duration. This recent episode lasted almost 2 weeks.  Blood tests in October, 2012 including CBC and liver function tests were within normal limits.  Patient has a history of esophageal stricture that was dilated a year ago. He's had no further symptoms.       Review of Systems: Pertinent positive and negative review of systems were noted in the above HPI section. All other review of systems were otherwise negative.    Current Medications, Allergies, Past Medical History, Past Surgical History, Family History and Social History were reviewed in [employer] [facility] electronic medical record  Vital signs were reviewed in today's medical record. Physical Exam: General: Well developed , well nourished, no acute distress Head: Normocephalic and atraumatic Eyes:  sclerae anicteric, EOMI Ears: Normal auditory acuity Mouth: No deformity or lesions Lungs: Clear throughout to auscultation Heart: Regular rate and rhythm; no murmurs, rubs or bruits Abdomen: Soft, non tender and non distended. No masses, hepatosplenomegaly or hernias noted. Normal Bowel sounds Rectal:deferred Musculoskeletal: Symmetrical with no gross deformities  Pulses:  Normal pulses noted Extremities: No clubbing, cyanosis, edema or deformities noted Neurological: Alert oriented x 4, grossly nonfocal Psychological:  Alert and cooperative. Normal mood and affect

## 2011-09-29 ENCOUNTER — Telehealth: Payer: Self-pay | Admitting: Internal Medicine

## 2011-09-29 MED ORDER — FINASTERIDE 5 MG PO TABS: 5.0000 mg | ORAL_TABLET | Freq: Every day | ORAL | Status: DC

## 2011-09-29 NOTE — Telephone Encounter (Signed)
Rx refill sent to pharmacy. 

## 2011-09-29 NOTE — Telephone Encounter (Signed)
Refill- finasteride 5mg  tablet. Take one tablet every day. Qty 90. Last fill 8.13.12

## 2011-11-06 ENCOUNTER — Other Ambulatory Visit: Payer: Self-pay | Admitting: Internal Medicine

## 2011-12-12 ENCOUNTER — Telehealth: Payer: Self-pay | Admitting: Internal Medicine

## 2011-12-12 MED ORDER — OLMESARTAN MEDOXOMIL 20 MG PO TABS: 20.0000 mg | ORAL_TABLET | Freq: Every day | ORAL | Status: DC

## 2011-12-12 NOTE — Telephone Encounter (Signed)
Refill sent to pharmacy.   

## 2012-02-15 ENCOUNTER — Other Ambulatory Visit: Payer: Self-pay | Admitting: *Deleted

## 2012-02-15 DIAGNOSIS — E785 Hyperlipidemia, unspecified: Secondary | ICD-10-CM

## 2012-02-15 DIAGNOSIS — I10 Essential (primary) hypertension: Secondary | ICD-10-CM

## 2012-02-15 DIAGNOSIS — R7309 Other abnormal glucose: Secondary | ICD-10-CM

## 2012-02-15 LAB — HEPATIC FUNCTION PANEL
ALT: 11 U/L (ref 0–53)
AST: 19 U/L (ref 0–37)
Bilirubin, Direct: 0.1 mg/dL (ref 0.0–0.3)
Indirect Bilirubin: 0.5 mg/dL (ref 0.0–0.9)

## 2012-02-15 LAB — BASIC METABOLIC PANEL
Chloride: 106 mEq/L (ref 96–112)
Potassium: 4.6 mEq/L (ref 3.5–5.3)
Sodium: 139 mEq/L (ref 135–145)

## 2012-02-15 LAB — LIPID PANEL
LDL Cholesterol: 69 mg/dL (ref 0–99)
Triglycerides: 175 mg/dL — ABNORMAL HIGH (ref <150)
VLDL: 35 mg/dL (ref 0–40)

## 2012-02-21 ENCOUNTER — Ambulatory Visit (INDEPENDENT_AMBULATORY_CARE_PROVIDER_SITE_OTHER): Payer: Medicare Other | Admitting: Internal Medicine

## 2012-02-21 ENCOUNTER — Encounter: Payer: Self-pay | Admitting: Internal Medicine

## 2012-02-21 VITALS — BP 118/72 | HR 55 | Temp 97.6°F | Ht 70.0 in | Wt 185.0 lb

## 2012-02-21 DIAGNOSIS — I10 Essential (primary) hypertension: Secondary | ICD-10-CM

## 2012-02-21 DIAGNOSIS — E785 Hyperlipidemia, unspecified: Secondary | ICD-10-CM

## 2012-02-21 DIAGNOSIS — K222 Esophageal obstruction: Secondary | ICD-10-CM

## 2012-02-21 MED ORDER — PANTOPRAZOLE SODIUM 40 MG PO TBEC: 40.0000 mg | DELAYED_RELEASE_TABLET | Freq: Every day | ORAL | Status: DC

## 2012-02-21 MED ORDER — LOSARTAN POTASSIUM 25 MG PO TABS: 25.0000 mg | ORAL_TABLET | Freq: Every day | ORAL | Status: DC

## 2012-02-25 NOTE — Assessment & Plan Note (Signed)
Change nexium to protonix for cost consideration.

## 2012-02-25 NOTE — Assessment & Plan Note (Signed)
Change benicar to cozaar for cost consideration. Monitor bp as outpt

## 2012-02-25 NOTE — Assessment & Plan Note (Signed)
Good control. Continue statin tx 

## 2012-02-25 NOTE — Progress Notes (Signed)
  Subjective:    Patient ID: Troy Ray, male    DOB: 1928/06/10, 78 y.o.   MRN: [id number]  HPI Pt presents to clinic for followup of multiple medical problems. Had brief three day period last week of fatigue and nausea without emesis or diarrhea. Sx's have resolved entirely. Denies abdominal pain or syncope. Seeing cardiology for f/u of CAD and carotid stenosis. Creatinine improved and without clinical evidence of volume overload.  Past Medical History  Diagnosis Date  . CAD (coronary artery disease)   . Hypertension   . Chronic kidney disease     chronic, stage II  . Hyperlipidemia   . PVD (peripheral vascular disease)   . Postural lightheadedness   . Memory loss   . Dysphagia   . Esophageal stricture     Dr [name]  . CRI (chronic renal insufficiency)   . Sensorineural hearing loss, bilateral   . Carotid stenosis, bilateral     right 60-79% stenosed.  Left 40-59%  . Adenomatous colon polyp   . Diverticulosis    Past Surgical History  Procedure Date  . Hernia repair 03/23/05    left inguinal Lichtenstein repair, with mesh.  Claud Kelp MD  . Abdominal aortic aneurysm repair 1996    Dr [name]  . Renal artery stent 1996    Dr [name].  Bilateral stenting    reports that he has quit smoking. He has never used smokeless tobacco. He reports that he drinks alcohol. His drug history not on file. family history includes Heart disease in his father.  There is no history of Cancer and Diabetes. No Known Allergies    Review of Systems see hpi     Objective:   Physical Exam  Physical Exam  Nursing note and vitals reviewed. Constitutional: Appears well-developed and well-nourished. No distress.  HENT:  Head: Normocephalic and atraumatic.  Right Ear: External ear normal.  Left Ear: External ear normal.  Eyes: Conjunctivae are normal. No scleral icterus.  Neck: Neck supple. Carotid bruit is not present.  Cardiovascular: Normal rate, regular rhythm and normal heart  sounds.  Exam reveals no gallop and no friction rub.   No murmur heard. Pulmonary/Chest: Effort normal and breath sounds normal. No respiratory distress. He has no wheezes. no rales.  Lymphadenopathy:    He has no cervical adenopathy.  Neurological:Alert.  Skin: Skin is warm and dry. Not diaphoretic.  Psychiatric: Has a normal mood and affect.        Assessment & Plan:

## 2012-03-07 ENCOUNTER — Other Ambulatory Visit: Payer: Self-pay | Admitting: Internal Medicine

## 2012-03-07 NOTE — Telephone Encounter (Signed)
Rx refill sent to pharmacy. 

## 2012-03-19 ENCOUNTER — Encounter: Payer: Self-pay | Admitting: *Deleted

## 2012-03-27 ENCOUNTER — Other Ambulatory Visit: Payer: Self-pay | Admitting: Internal Medicine

## 2012-03-27 NOTE — Telephone Encounter (Signed)
Rx refill sent to pharmacy. 

## 2012-04-03 ENCOUNTER — Ambulatory Visit (INDEPENDENT_AMBULATORY_CARE_PROVIDER_SITE_OTHER): Payer: Medicare Other | Admitting: Cardiology

## 2012-04-03 ENCOUNTER — Encounter: Payer: Self-pay | Admitting: Cardiology

## 2012-04-03 VITALS — BP 138/78 | HR 65 | Ht 70.0 in | Wt 186.0 lb

## 2012-04-03 DIAGNOSIS — N182 Chronic kidney disease, stage 2 (mild): Secondary | ICD-10-CM

## 2012-04-03 DIAGNOSIS — Z9889 Other specified postprocedural states: Secondary | ICD-10-CM

## 2012-04-03 DIAGNOSIS — I2510 Atherosclerotic heart disease of native coronary artery without angina pectoris: Secondary | ICD-10-CM

## 2012-04-03 DIAGNOSIS — I6529 Occlusion and stenosis of unspecified carotid artery: Secondary | ICD-10-CM

## 2012-04-03 DIAGNOSIS — I10 Essential (primary) hypertension: Secondary | ICD-10-CM

## 2012-04-03 NOTE — Assessment & Plan Note (Addendum)
Asymptomatic. Protocol for threatened stroke reviewed including 911 and taking extra aspirin. Will arrange carotid Dopplers.

## 2012-04-03 NOTE — Progress Notes (Signed)
HPI Troy Ray returns today for evaluation and management of his coronary artery disease, history of carotid disease with a history of a TIA affecting his left leg in the past, as well as a history of abdominal aortic aneurysm with repair.  He is having no angina or symptoms of TIAs. Symptoms of TIAs were carefully reviewed.  He is compliant with his medications. He denies orthopnea, PND or edema. He denies claudication or significant back pain.  Laboratory data being followed by his primary care.  He is up for carotid Dopplers.  Past Medical History  Diagnosis Date  . CAD (coronary artery disease)   . Hypertension   . Chronic kidney disease     chronic, stage II  . Hyperlipidemia   . PVD (peripheral vascular disease)   . Postural lightheadedness   . Memory loss   . Dysphagia   . Esophageal stricture     Dr [name]  . CRI (chronic renal insufficiency)   . Sensorineural hearing loss, bilateral   . Carotid stenosis, bilateral     right 60-79% stenosed.  Left 40-59%  . Adenomatous colon polyp   . Diverticulosis     Current Outpatient Prescriptions  Medication Sig Dispense Refill  . aspirin EC 81 MG EC tablet Take 1 tablet (81 mg total) by mouth daily.      . CRESTOR 20 MG tablet TAKE 1 TABLET (20 MG TOTAL) BY MOUTH DAILY.  90 tablet  1  . finasteride (PROSCAR) 5 MG tablet TAKE 1 TABLET (5 MG TOTAL) BY MOUTH DAILY.  90 tablet  1  . losartan (COZAAR) 25 MG tablet Take 1 tablet (25 mg total) by mouth daily.  30 tablet  6  . metoprolol succinate (TOPROL-XL) 25 MG 24 hr tablet Take 1 tablet (25 mg total) by mouth daily.  30 tablet  3  . pantoprazole (PROTONIX) 40 MG tablet Take 1 tablet (40 mg total) by mouth daily.  30 tablet  6    No Known Allergies  Family History  Problem Relation Age of Onset  . Heart disease Father     CAD  . Cancer Neg Hx   . Diabetes Neg Hx   . Coronary artery disease Father     History   Social History  . Marital Status: Married    Spouse  Name: N/A    Number of Children: 3  . Years of Education: N/A   Occupational History  . MEDIA ASSISTANT    Social History Main Topics  . Smoking status: Former Games developer  . Smokeless tobacco: Never Used   Comment: quit 1980  . Alcohol Use: Yes  . Drug Use: Not on file  . Sexually Active: Not on file   Other Topics Concern  . Not on file   Social History Narrative  . No narrative on file    ROS ALL NEGATIVE EXCEPT THOSE NOTED IN HPI  PE  General Appearance: well developed, well nourished in no acute distress, elderly HEENT: symmetrical face, PERRLA,  Neck: no JVD, thyromegaly, or adenopathy, trachea midline Chest: symmetric without deformity Cardiac: PMI non-displaced, RRR, normal S1, S2, no gallop or murmur Lung: clear to ausculation and percussion Vascular: Pulses diminished but present in the lower extremities. No obvious bruits appreciated Abdominal: nondistended, nontender, good bowel sounds, no HSM, no bruits Extremities: no cyanosis, clubbing or edema, no sign of DVT, no varicosities  Skin: normal color, no rashes Neuro: alert and oriented x 3, non-focal Pysch: normal affect  EKG Normal sinus  rhythm, normal EKG BMET    Component Value Date/Time   NA 139 02/15/2012 1013   K 4.6 02/15/2012 1013   CL 106 02/15/2012 1013   CO2 24 02/15/2012 1013   GLUCOSE 108* 02/15/2012 1013   BUN 22 02/15/2012 1013   CREATININE 1.36* 02/15/2012 1013   CREATININE 1.47 11/17/2010 1902   CALCIUM 9.5 02/15/2012 1013   GFRNONAA 51* 10/27/2010 2229   GFRAA  Value: >60        The eGFR has been calculated using the MDRD equation. This calculation has not been validated in all clinical situations. eGFR's persistently <60 mL/min signify possible Chronic Kidney Disease. 10/27/2010 2229    Lipid Panel     Component Value Date/Time   CHOL 143 02/15/2012 1013   TRIG 175* 02/15/2012 1013   HDL 39* 02/15/2012 1013   CHOLHDL 3.7 02/15/2012 1013   VLDL 35 02/15/2012 1013   LDLCALC 69 02/15/2012 1013      CBC    Component Value Date/Time   WBC 10.2 08/16/2011 1036   RBC 5.07 08/16/2011 1036   HGB 15.0 08/16/2011 1036   HCT 46.9 08/16/2011 1036   PLT 265 08/16/2011 1036   MCV 92.5 08/16/2011 1036   MCH 29.6 08/16/2011 1036   MCHC 32.0 08/16/2011 1036   RDW 14.5 08/16/2011 1036   LYMPHSABS 2.9 11/30/2009 1910   MONOABS 1.1* 11/30/2009 1910   EOSABS 0.3 11/30/2009 1910   BASOSABS 0.0 11/30/2009 1910

## 2012-04-03 NOTE — Assessment & Plan Note (Signed)
No symptoms of coronary ischemia. Continue secondary preventative therapy.

## 2012-04-03 NOTE — Patient Instructions (Addendum)
Your physician has requested that you have a carotid duplex. This test is an ultrasound of the carotid arteries in your neck. It looks at blood flow through these arteries that supply the brain with blood. Allow one hour for this exam. There are no restrictions or special instructions.  Your physician recommends that you continue on your current medications as directed. Please refer to the Current Medication list given to you today.  Your physician wants you to follow-up in: 1 year. You will receive a reminder letter in the mail two months in advance. If you don't receive a letter, please call our office to schedule the follow-up appointment.  

## 2012-04-18 ENCOUNTER — Encounter (INDEPENDENT_AMBULATORY_CARE_PROVIDER_SITE_OTHER): Payer: Medicare Other

## 2012-04-18 DIAGNOSIS — I6529 Occlusion and stenosis of unspecified carotid artery: Secondary | ICD-10-CM

## 2012-04-23 ENCOUNTER — Ambulatory Visit (INDEPENDENT_AMBULATORY_CARE_PROVIDER_SITE_OTHER): Payer: Medicare Other | Admitting: Internal Medicine

## 2012-04-23 ENCOUNTER — Encounter: Payer: Self-pay | Admitting: Internal Medicine

## 2012-04-23 VITALS — BP 116/70 | HR 55 | Temp 97.4°F | Resp 18 | Ht 70.0 in | Wt 185.0 lb

## 2012-04-23 DIAGNOSIS — I10 Essential (primary) hypertension: Secondary | ICD-10-CM

## 2012-04-23 DIAGNOSIS — E785 Hyperlipidemia, unspecified: Secondary | ICD-10-CM

## 2012-04-23 MED ORDER — LOSARTAN POTASSIUM 25 MG PO TABS: 25.0000 mg | ORAL_TABLET | Freq: Every day | ORAL | Status: DC

## 2012-04-23 MED ORDER — PANTOPRAZOLE SODIUM 40 MG PO TBEC: 40.0000 mg | DELAYED_RELEASE_TABLET | Freq: Every day | ORAL | Status: DC

## 2012-04-23 NOTE — Progress Notes (Signed)
  Subjective:    Patient ID: Troy Ray, male    DOB: October 16, 1928, 78 y.o.   MRN: [id number]  HPI Pt presents to clinic for followup of multiple medical problems. S/p change of benicar to losartan last visit due to cost. Tolerating medication without difficultly. Home bp results have been nl. Has noted yet changed from nexium to protonix (cost as well).   Past Medical History  Diagnosis Date  . CAD (coronary artery disease)   . Hypertension   . Chronic kidney disease     chronic, stage II  . Hyperlipidemia   . PVD (peripheral vascular disease)   . Postural lightheadedness   . Memory loss   . Dysphagia   . Esophageal stricture     Dr [name]  . CRI (chronic renal insufficiency)   . Sensorineural hearing loss, bilateral   . Carotid stenosis, bilateral     right 60-79% stenosed.  Left 40-59%  . Adenomatous colon polyp   . Diverticulosis    Past Surgical History  Procedure Date  . Hernia repair 03/23/05    left inguinal Lichtenstein repair, with mesh.  Claud Kelp MD  . Abdominal aortic aneurysm repair 1996    Dr [name]  . Renal artery stent 1996    Dr [name].  Bilateral stenting    reports that he has quit smoking. He has never used smokeless tobacco. He reports that he drinks alcohol. His drug history not on file. family history includes Coronary artery disease in his father and Heart disease in his father.  There is no history of Cancer and Diabetes. No Known Allergies    Review of Systems see hpi     Objective:   Physical Exam  Physical Exam  Nursing note and vitals reviewed. Constitutional: Appears well-developed and well-nourished. No distress.  HENT:  Head: Normocephalic and atraumatic.  Right Ear: External ear normal.  Left Ear: External ear normal.  Eyes: Conjunctivae are normal. No scleral icterus.  Neck: Neck supple. Carotid bruit is not present.  Cardiovascular: Normal rate, regular rhythm and normal heart sounds.  Exam reveals no gallop and no  friction rub.   No murmur heard. Pulmonary/Chest: Effort normal and breath sounds normal. No respiratory distress. He has no wheezes. no rales.  Lymphadenopathy:    He has no cervical adenopathy.  Neurological:Alert.  Skin: Skin is warm and dry. Not diaphoretic.  Psychiatric: Has a normal mood and affect.        Assessment & Plan:

## 2012-04-23 NOTE — Assessment & Plan Note (Signed)
Normotensive and stable. Continue current regimen. Monitor bp as outpt and followup in clinic as scheduled. Obtain chem7 prior to next visit 

## 2012-04-23 NOTE — Patient Instructions (Signed)
Please schedule fasting labs prior to next visit Lipid/lft-272.4 and chem7-v58.69 

## 2012-04-23 NOTE — Assessment & Plan Note (Signed)
Obtain lipid/lft prior to next visit 

## 2012-05-11 ENCOUNTER — Other Ambulatory Visit: Payer: Self-pay | Admitting: Internal Medicine

## 2012-05-13 NOTE — Telephone Encounter (Signed)
Nexium denial sent to pharmacy as this medication is no longer on pt's med list. Was reported last that pt is taking pantoprazole.

## 2012-05-24 ENCOUNTER — Other Ambulatory Visit: Payer: Self-pay | Admitting: Internal Medicine

## 2012-09-12 ENCOUNTER — Other Ambulatory Visit: Payer: Self-pay | Admitting: Internal Medicine

## 2012-09-13 ENCOUNTER — Other Ambulatory Visit: Payer: Self-pay | Admitting: Internal Medicine

## 2012-09-13 ENCOUNTER — Telehealth: Payer: Self-pay | Admitting: *Deleted

## 2012-09-13 DIAGNOSIS — Z79899 Other long term (current) drug therapy: Secondary | ICD-10-CM

## 2012-09-13 DIAGNOSIS — E785 Hyperlipidemia, unspecified: Secondary | ICD-10-CM

## 2012-09-13 NOTE — Telephone Encounter (Signed)
Pt presented to the lab. Orders entered per last office note as below:  Please schedule fasting labs prior to next visit  Lipid/lft 272.4 and chem7-v58.69

## 2012-09-14 LAB — HEPATIC FUNCTION PANEL
ALT: 17 U/L (ref 0–53)
AST: 19 U/L (ref 0–37)
Albumin: 4 g/dL (ref 3.5–5.2)
Alkaline Phosphatase: 44 U/L (ref 39–117)
Total Protein: 7 g/dL (ref 6.0–8.3)

## 2012-09-14 LAB — BASIC METABOLIC PANEL
Calcium: 9.3 mg/dL (ref 8.4–10.5)
Creat: 1.37 mg/dL — ABNORMAL HIGH (ref 0.50–1.35)
Sodium: 140 mEq/L (ref 135–145)

## 2012-09-14 LAB — LIPID PANEL
HDL: 36 mg/dL — ABNORMAL LOW (ref >39)
Triglycerides: 149 mg/dL (ref <150)

## 2012-09-20 ENCOUNTER — Ambulatory Visit: Payer: Medicare Other | Admitting: Internal Medicine

## 2012-09-30 ENCOUNTER — Encounter: Payer: Self-pay | Admitting: Internal Medicine

## 2012-09-30 ENCOUNTER — Ambulatory Visit (INDEPENDENT_AMBULATORY_CARE_PROVIDER_SITE_OTHER): Payer: Medicare Other | Admitting: Internal Medicine

## 2012-09-30 VITALS — BP 132/82 | HR 58 | Temp 97.3°F | Resp 14 | Wt 184.5 lb

## 2012-09-30 DIAGNOSIS — Z23 Encounter for immunization: Secondary | ICD-10-CM

## 2012-09-30 DIAGNOSIS — E785 Hyperlipidemia, unspecified: Secondary | ICD-10-CM

## 2012-09-30 DIAGNOSIS — I10 Essential (primary) hypertension: Secondary | ICD-10-CM

## 2012-09-30 MED ORDER — LOSARTAN POTASSIUM 50 MG PO TABS: 50.0000 mg | ORAL_TABLET | Freq: Every day | ORAL | Status: DC

## 2012-09-30 NOTE — Progress Notes (Signed)
  Subjective:    Patient ID: Troy Ray, male    DOB: 03-05-1928, 78 y.o.   MRN: [id number]  HPI Pt presents to clinic for followup of multiple medical problems. BP under less optimal control since change from benicar to losartan. Tolerates without side effect. Notes chronic intermittent episodes of feeling like his body/torso is flushing (like a toilet) followed by bilateral leg tingling. Has difficulty describing the sx's.  Sx's resolve spontaneously after seconds. Often is lying or sitting when it happens. Happens on a somewhat rare basis. Denies associated cp, palpitations, dizziness, focal weakness, difficulty with speech. Currently asx. Denies other alleviating or exacerbating factors. States has received influenza vaccine for the season.  Past Medical History  Diagnosis Date  . CAD (coronary artery disease)   . Hypertension   . Chronic kidney disease     chronic, stage II  . Hyperlipidemia   . PVD (peripheral vascular disease)   . Postural lightheadedness   . Memory loss   . Dysphagia   . Esophageal stricture     Dr [name]  . CRI (chronic renal insufficiency)   . Sensorineural hearing loss, bilateral   . Carotid stenosis, bilateral     right 60-79% stenosed.  Left 40-59%  . Adenomatous colon polyp   . Diverticulosis    Past Surgical History  Procedure Date  . Hernia repair 03/23/05    left inguinal Lichtenstein repair, with mesh.  Claud Kelp MD  . Abdominal aortic aneurysm repair 1996    Dr [name]  . Renal artery stent 1996    Dr [name].  Bilateral stenting    reports that he has quit smoking. He has never used smokeless tobacco. He reports that he drinks alcohol. His drug history not on file. family history includes Coronary artery disease in his father and Heart disease in his father.  There is no history of Cancer and Diabetes. No Known Allergies    Review of Systems see hpi     Objective:   Physical Exam  Physical Exam  Nursing note and vitals  reviewed. Constitutional: Appears well-developed and well-nourished. No distress.  HENT:  Head: Normocephalic and atraumatic.  Right Ear: External ear normal.  Left Ear: External ear normal.  Eyes: Conjunctivae are normal. No scleral icterus.  Neck: Neck supple. Carotid bruit is not present.  Cardiovascular: Normal rate, regular rhythm and normal heart sounds.  Exam reveals no gallop and no friction rub.   No murmur heard. Pulmonary/Chest: Effort normal and breath sounds normal. No respiratory distress. He has no wheezes. no rales.  Lymphadenopathy:    He has no cervical adenopathy.  Neurological:Alert.  Skin: Skin is warm and dry. Not diaphoretic.  Psychiatric: Has a normal mood and affect.        Assessment & Plan:

## 2012-09-30 NOTE — Patient Instructions (Signed)
Please increase your cozaar dose from 25mg  one a day to two a day. When you run out there will be a new prescription for 50mg  at the drugstore.

## 2012-09-30 NOTE — Assessment & Plan Note (Signed)
Slight loss of control. Increase losartan 50mg  po qd. With above mention episodes difficult to describe begin bp/hr monitoring and obtain during next occurrence of sx's.

## 2012-09-30 NOTE — Assessment & Plan Note (Signed)
Good control with exception of low hdl. May improve with exercise. Continue current statin dose.

## 2012-10-09 ENCOUNTER — Other Ambulatory Visit: Payer: Self-pay | Admitting: Internal Medicine

## 2012-11-15 ENCOUNTER — Other Ambulatory Visit: Payer: Self-pay | Admitting: Internal Medicine

## 2012-11-15 NOTE — Telephone Encounter (Signed)
Rx to pharmacy/SLS 

## 2012-11-16 ENCOUNTER — Other Ambulatory Visit: Payer: Self-pay | Admitting: Internal Medicine

## 2012-11-18 NOTE — Telephone Encounter (Signed)
DENIED--last Rx 11.25.13 #90x1--too soon for RF request/SLS

## 2012-11-23 ENCOUNTER — Other Ambulatory Visit: Payer: Self-pay | Admitting: Internal Medicine

## 2012-11-23 NOTE — Progress Notes (Signed)
  Subjective:    Patient ID: Troy Ray, male    DOB: 1928/07/08, 79 y.o.   MRN: [id number]  HPI appt rescheduled    Review of Systems     Objective:   Physical Exam        Assessment & Plan:

## 2012-12-16 ENCOUNTER — Telehealth: Payer: Self-pay | Admitting: *Deleted

## 2012-12-16 DIAGNOSIS — I10 Essential (primary) hypertension: Secondary | ICD-10-CM

## 2012-12-16 DIAGNOSIS — E785 Hyperlipidemia, unspecified: Secondary | ICD-10-CM

## 2012-12-16 DIAGNOSIS — R739 Hyperglycemia, unspecified: Secondary | ICD-10-CM

## 2012-12-16 LAB — HEPATIC FUNCTION PANEL
ALT: 15 U/L (ref 0–53)
Albumin: 4 g/dL (ref 3.5–5.2)
Alkaline Phosphatase: 42 U/L (ref 39–117)
Total Protein: 6.8 g/dL (ref 6.0–8.3)

## 2012-12-16 LAB — LIPID PANEL
Cholesterol: 128 mg/dL (ref 0–200)
Total CHOL/HDL Ratio: 3.5 Ratio
Triglycerides: 90 mg/dL (ref <150)
VLDL: 18 mg/dL (ref 0–40)

## 2012-12-16 LAB — CBC WITH DIFFERENTIAL/PLATELET
Basophils Absolute: 0 10*3/uL (ref 0.0–0.1)
Basophils Relative: 1 % (ref 0–1)
Eosinophils Absolute: 0.3 10*3/uL (ref 0.0–0.7)
Hemoglobin: 14.2 g/dL (ref 13.0–17.0)
MCH: 29.3 pg (ref 26.0–34.0)
MCHC: 34.1 g/dL (ref 30.0–36.0)
Neutro Abs: 4.6 10*3/uL (ref 1.7–7.7)
Neutrophils Relative %: 62 % (ref 43–77)
Platelets: 157 10*3/uL (ref 150–400)
RDW: 14.4 % (ref 11.5–15.5)

## 2012-12-16 LAB — BASIC METABOLIC PANEL
CO2: 24 mEq/L (ref 19–32)
Calcium: 8.9 mg/dL (ref 8.4–10.5)
Chloride: 107 mEq/L (ref 96–112)
Glucose, Bld: 101 mg/dL — ABNORMAL HIGH (ref 70–99)
Sodium: 139 mEq/L (ref 135–145)

## 2012-12-16 LAB — TSH: TSH: 2.734 u[IU]/mL (ref 0.350–4.500)

## 2012-12-16 NOTE — Telephone Encounter (Signed)
Orders entered and given to the lab. 

## 2012-12-16 NOTE — Telephone Encounter (Signed)
Lipid for hyperlipidmia, hgba1c for hyperglycemia, tsh, renal, hepatic, cbc for HTN THX

## 2012-12-16 NOTE — Telephone Encounter (Signed)
Pt has follow up with Dr Abner Greenspan on 12/23/12. He came to the lab for blood work today. Please advise what you would like to order and dx codes?

## 2012-12-23 ENCOUNTER — Ambulatory Visit (INDEPENDENT_AMBULATORY_CARE_PROVIDER_SITE_OTHER): Payer: Medicare Other | Admitting: Family Medicine

## 2012-12-23 ENCOUNTER — Encounter: Payer: Self-pay | Admitting: Family Medicine

## 2012-12-23 VITALS — BP 118/64 | HR 57 | Temp 97.5°F | Ht 63.25 in | Wt 183.0 lb

## 2012-12-23 DIAGNOSIS — K5900 Constipation, unspecified: Secondary | ICD-10-CM

## 2012-12-23 DIAGNOSIS — I10 Essential (primary) hypertension: Secondary | ICD-10-CM

## 2012-12-23 DIAGNOSIS — E785 Hyperlipidemia, unspecified: Secondary | ICD-10-CM

## 2012-12-23 DIAGNOSIS — R279 Unspecified lack of coordination: Secondary | ICD-10-CM

## 2012-12-23 MED ORDER — LOSARTAN POTASSIUM 25 MG PO TABS: 25.0000 mg | ORAL_TABLET | Freq: Every day | ORAL | Status: DC

## 2012-12-23 NOTE — Patient Instructions (Addendum)
Drink 64 oz of fluids daily Start a probiotic, such as Librarian, academic or Digestive Advantae Add a fiber supplement such as Metamucil daily  Try a Gatorade daily the days you have to work at the [employer]   Constipation, Adult Constipation is when a person has fewer than 3 bowel movements a week; has difficulty having a bowel movement; or has stools that are dry, hard, or larger than normal. As people grow older, constipation is more common. If you try to fix constipation with medicines that make you have a bowel movement (laxatives), the problem may get worse. Long-term laxative use may cause the muscles of the colon to become weak. A low-fiber diet, not taking in enough fluids, and taking certain medicines may make constipation worse. CAUSES   Certain medicines, such as antidepressants, pain medicine, iron supplements, antacids, and water pills.   Certain diseases, such as diabetes, irritable bowel syndrome (IBS), thyroid disease, or depression.   Not drinking enough water.   Not eating enough fiber-rich foods.   Stress or travel.  Lack of physical activity or exercise.  Not going to the restroom when there is the urge to have a bowel movement.  Ignoring the urge to have a bowel movement.  Using laxatives too much. SYMPTOMS   Having fewer than 3 bowel movements a week.   Straining to have a bowel movement.   Having hard, dry, or larger than normal stools.   Feeling full or bloated.   Pain in the lower abdomen.  Not feeling relief after having a bowel movement. DIAGNOSIS  Your caregiver will take a medical history and perform a physical exam. Further testing may be done for severe constipation. Some tests may include:   A barium enema X-ray to examine your rectum, colon, and sometimes, your small intestine.  A sigmoidoscopy to examine your lower colon.  A colonoscopy to examine your entire colon. TREATMENT  Treatment will depend on the severity of your constipation and  what is causing it. Some dietary treatments include drinking more fluids and eating more fiber-rich foods. Lifestyle treatments may include regular exercise. If these diet and lifestyle recommendations do not help, your caregiver may recommend taking over-the-counter laxative medicines to help you have bowel movements. Prescription medicines may be prescribed if over-the-counter medicines do not work.  HOME CARE INSTRUCTIONS   Increase dietary fiber in your diet, such as fruits, vegetables, whole grains, and beans. Limit high-fat and processed sugars in your diet, such as [place] fries, hamburgers, cookies, candies, and soda.   A fiber supplement may be added to your diet if you cannot get enough fiber from foods.   Drink enough fluids to keep your urine clear or pale yellow.   Exercise regularly or as directed by your caregiver.   Go to the restroom when you have the urge to go. Do not hold it.  Only take medicines as directed by your caregiver. Do not take other medicines for constipation without talking to your caregiver first. SEEK IMMEDIATE MEDICAL CARE IF:   You have bright red blood in your stool.   Your constipation lasts for more than 4 days or gets worse.   You have abdominal or rectal pain.   You have thin, pencil-like stools.  You have unexplained weight loss. MAKE SURE YOU:   Understand these instructions.  Will watch your condition.  Will get help right away if you are not doing well or get worse. Document Released: 07/21/2004 Document Revised: 01/15/2012 Document Reviewed: 09/26/2011 [employer] Patient Information  [address], [place].

## 2012-12-25 ENCOUNTER — Ambulatory Visit: Payer: Medicare Other | Admitting: Internal Medicine

## 2012-12-29 ENCOUNTER — Encounter: Payer: Self-pay | Admitting: Family Medicine

## 2012-12-29 DIAGNOSIS — K5900 Constipation, unspecified: Secondary | ICD-10-CM

## 2012-12-29 HISTORY — DX: Constipation, unspecified: K59.00

## 2012-12-29 NOTE — Assessment & Plan Note (Signed)
Well controlled, no changes, avoid sodium and caffeine

## 2012-12-29 NOTE — Assessment & Plan Note (Signed)
Add fiber and probiotic supplements, increase hydration and if no response then can add Docusate and/or senna.  May use prune juice and MOM

## 2012-12-29 NOTE — Progress Notes (Signed)
Patient ID: Troy Ray, male   DOB: 12-Feb-1928, 79 y.o.   MRN: [id number] KAHLE MCQUEEN [id number] [birth year] 12/29/2012      Progress Note-Follow Up  Subjective  Chief Complaint  Chief Complaint  Patient presents with  . Follow-up    3 month    HPI  Patient is an 79 year old Caucasian male who is in today with his son. His toes well. He's not had any progression in his tremor. He has been seen by neurology and they have concluded that he is not suffering with a progressive neurologic disorder. Symptoms stem from acute illness back in 2012. No GI r GU complaints of. Overall he feels well. Recent illness. No chest pain, palpitations, shortness of breath. Otherwise eating well without any fevers congestion or acute complaints  Past Medical History  Diagnosis Date  . CAD (coronary artery disease)   . Hypertension   . Chronic kidney disease     chronic, stage II  . Hyperlipidemia   . PVD (peripheral vascular disease)   . Postural lightheadedness   . Memory loss   . Dysphagia   . Esophageal stricture     Dr [name]  . CRI (chronic renal insufficiency)   . Sensorineural hearing loss, bilateral   . Carotid stenosis, bilateral     right 60-79% stenosed.  Left 40-59%  . Adenomatous colon polyp   . Diverticulosis   . Unspecified constipation 12/29/2012    Past Surgical History  Procedure Laterality Date  . Hernia repair  03/23/05    left inguinal Lichtenstein repair, with mesh.  Claud Kelp MD  . Abdominal aortic aneurysm repair  1996    Dr [name]  . Renal artery stent  1996    Dr [name].  Bilateral stenting    Family History  Problem Relation Age of Onset  . Heart disease Father     CAD  . Cancer Neg Hx   . Diabetes Neg Hx   . Coronary artery disease Father     History   Social History  . Marital Status: Married    Spouse Name: N/A    Number of Children: 3  . Years of Education: N/A   Occupational History  . MEDIA ASSISTANT    Social History Main Topics   . Smoking status: Former Games developer  . Smokeless tobacco: Never Used     Comment: quit 1980  . Alcohol Use: Yes  . Drug Use: Not on file  . Sexually Active: Not on file   Other Topics Concern  . Not on file   Social History Narrative  . No narrative on file    Current Outpatient Prescriptions on File Prior to Visit  Medication Sig Dispense Refill  . aspirin EC 81 MG EC tablet Take 1 tablet (81 mg total) by mouth daily.      . CRESTOR 20 MG tablet TAKE 1 TABLET (20 MG TOTAL) BY MOUTH DAILY.  90 tablet  1  . finasteride (PROSCAR) 5 MG tablet TAKE 1 TABLET (5 MG TOTAL) BY MOUTH DAILY.  90 tablet  1  . metoprolol succinate (TOPROL-XL) 25 MG 24 hr tablet TAKE 1 TABLET BY MOUTH DAILY  90 tablet  1  . pantoprazole (PROTONIX) 40 MG tablet TAKE 1 TABLET (40 MG TOTAL) BY MOUTH DAILY.  90 tablet  1   No current facility-administered medications on file prior to visit.    No Known Allergies  Review of Systems  Review of Systems  Constitutional: Negative for fever and  malaise/fatigue.  HENT: Negative for congestion.   Eyes: Negative for discharge.  Respiratory: Negative for shortness of breath.   Cardiovascular: Negative for chest pain, palpitations and leg swelling.  Gastrointestinal: Negative for nausea, abdominal pain and diarrhea.  Genitourinary: Negative for dysuria.  Musculoskeletal: Negative for falls.  Skin: Negative for rash.  Neurological: Negative for loss of consciousness and headaches.  Endo/Heme/Allergies: Negative for polydipsia.  Psychiatric/Behavioral: Negative for depression and suicidal ideas. The patient is not nervous/anxious and does not have insomnia.     Objective  BP 118/64  Pulse 57  Temp(Src) 97.5 F (36.4 C) (Oral)  Ht 5' 3.25" (1.607 m)  Wt 183 lb (83.008 kg)  BMI 32.14 kg/m2  SpO2 97%  Physical Exam  Physical Exam  Constitutional: He is oriented to person, place, and time and well-developed, well-nourished, and in no distress. No distress.   HENT:  Head: Normocephalic and atraumatic.  Eyes: Conjunctivae are normal.  Neck: Neck supple. No thyromegaly present.  Cardiovascular: Normal rate, regular rhythm and normal heart sounds.   No murmur heard. Pulmonary/Chest: Effort normal and breath sounds normal. No respiratory distress.  Abdominal: He exhibits no distension and no mass. There is no tenderness.  Musculoskeletal: He exhibits no edema.  Neurological: He is alert and oriented to person, place, and time. Coordination abnormal.  Skin: Skin is warm.  Psychiatric: Memory, affect and judgment normal.    Lab Results  Component Value Date   TSH 2.734 12/16/2012   Lab Results  Component Value Date   WBC 7.3 12/16/2012   HGB 14.2 12/16/2012   HCT 41.7 12/16/2012   MCV 86.2 12/16/2012   PLT 157 12/16/2012   Lab Results  Component Value Date   CREATININE 1.26 12/16/2012   BUN 22 12/16/2012   NA 139 12/16/2012   K 5.0 12/16/2012   CL 107 12/16/2012   CO2 24 12/16/2012   Lab Results  Component Value Date   ALT 15 12/16/2012   AST 23 12/16/2012   ALKPHOS 42 12/16/2012   BILITOT 0.6 12/16/2012   Lab Results  Component Value Date   CHOL 128 12/16/2012   Lab Results  Component Value Date   HDL 37* 12/16/2012   Lab Results  Component Value Date   LDLCALC 73 12/16/2012   Lab Results  Component Value Date   TRIG 90 12/16/2012   Lab Results  Component Value Date   CHOLHDL 3.5 12/16/2012     Assessment & Plan  HYPERTENSION Well controlled, no changes, avoid sodium and caffeine  Unspecified constipation Add fiber and probiotic supplements, increase hydration and if no response then can add Docusate and/or senna.  May use prune juice and MOM  LACK OF COORDINATION Has been seen by neurology and they have not concluded he has a progressive neurologic disorder. The belief is his symptoms are a result of a previous acute illness and should not progress rapidly.   HYPERLIPIDEMIA Tolerating Crestor, no changes

## 2012-12-29 NOTE — Assessment & Plan Note (Signed)
Has been seen by neurology and they have not concluded he has a progressive neurologic disorder. The belief is his symptoms are a result of a previous acute illness and should not progress rapidly.

## 2012-12-29 NOTE — Assessment & Plan Note (Signed)
Tolerating Crestor, no changes 

## 2013-03-17 ENCOUNTER — Ambulatory Visit: Payer: Medicare Other | Admitting: Family Medicine

## 2013-03-26 ENCOUNTER — Encounter: Payer: Self-pay | Admitting: Family Medicine

## 2013-03-26 ENCOUNTER — Telehealth: Payer: Self-pay | Admitting: Family Medicine

## 2013-03-26 ENCOUNTER — Ambulatory Visit (INDEPENDENT_AMBULATORY_CARE_PROVIDER_SITE_OTHER): Payer: Medicare Other | Admitting: Family Medicine

## 2013-03-26 VITALS — BP 126/78 | HR 56 | Temp 98.2°F | Ht 70.0 in | Wt 186.0 lb

## 2013-03-26 DIAGNOSIS — N182 Chronic kidney disease, stage 2 (mild): Secondary | ICD-10-CM

## 2013-03-26 DIAGNOSIS — R7309 Other abnormal glucose: Secondary | ICD-10-CM

## 2013-03-26 DIAGNOSIS — E785 Hyperlipidemia, unspecified: Secondary | ICD-10-CM

## 2013-03-26 DIAGNOSIS — R42 Dizziness and giddiness: Secondary | ICD-10-CM

## 2013-03-26 DIAGNOSIS — I10 Essential (primary) hypertension: Secondary | ICD-10-CM

## 2013-03-26 DIAGNOSIS — K5900 Constipation, unspecified: Secondary | ICD-10-CM

## 2013-03-26 NOTE — Assessment & Plan Note (Signed)
Only 2 brief episodes of feeling flushed while lying in bed

## 2013-03-26 NOTE — Patient Instructions (Addendum)
Consider adding Benefiber powder in a beverage daily Drink 64 oz of fluids daily  start probiotic daily Labs prior to visit, lipid, renal, cbc, hepatic, tsh, hgba1c Constipation, Adult Constipation is when a person has fewer than 3 bowel movements a week; has difficulty having a bowel movement; or has stools that are dry, hard, or larger than normal. As people grow older, constipation is more common. If you try to fix constipation with medicines that make you have a bowel movement (laxatives), the problem may get worse. Long-term laxative use may cause the muscles of the colon to become weak. A low-fiber diet, not taking in enough fluids, and taking certain medicines may make constipation worse. CAUSES   Certain medicines, such as antidepressants, pain medicine, iron supplements, antacids, and water pills.   Certain diseases, such as diabetes, irritable bowel syndrome (IBS), thyroid disease, or depression.   Not drinking enough water.   Not eating enough fiber-rich foods.   Stress or travel.  Lack of physical activity or exercise.  Not going to the restroom when there is the urge to have a bowel movement.  Ignoring the urge to have a bowel movement.  Using laxatives too much. SYMPTOMS   Having fewer than 3 bowel movements a week.   Straining to have a bowel movement.   Having hard, dry, or larger than normal stools.   Feeling full or bloated.   Pain in the lower abdomen.  Not feeling relief after having a bowel movement. DIAGNOSIS  Your caregiver will take a medical history and perform a physical exam. Further testing may be done for severe constipation. Some tests may include:   A barium enema X-ray to examine your rectum, colon, and sometimes, your small intestine.  A sigmoidoscopy to examine your lower colon.  A colonoscopy to examine your entire colon. TREATMENT  Treatment will depend on the severity of your constipation and what is causing it. Some dietary  treatments include drinking more fluids and eating more fiber-rich foods. Lifestyle treatments may include regular exercise. If these diet and lifestyle recommendations do not help, your caregiver may recommend taking over-the-counter laxative medicines to help you have bowel movements. Prescription medicines may be prescribed if over-the-counter medicines do not work.  HOME CARE INSTRUCTIONS   Increase dietary fiber in your diet, such as fruits, vegetables, whole grains, and beans. Limit high-fat and processed sugars in your diet, such as [place] fries, hamburgers, cookies, candies, and soda.   A fiber supplement may be added to your diet if you cannot get enough fiber from foods.   Drink enough fluids to keep your urine clear or pale yellow.   Exercise regularly or as directed by your caregiver.   Go to the restroom when you have the urge to go. Do not hold it.  Only take medicines as directed by your caregiver. Do not take other medicines for constipation without talking to your caregiver first. SEEK IMMEDIATE MEDICAL CARE IF:   You have bright red blood in your stool.   Your constipation lasts for more than 4 days or gets worse.   You have abdominal or rectal pain.   You have thin, pencil-like stools.  You have unexplained weight loss. MAKE SURE YOU:   Understand these instructions.  Will watch your condition.  Will get help right away if you are not doing well or get worse. Document Released: 07/21/2004 Document Revised: 01/15/2012 Document Reviewed: 09/26/2011 [facility] Patient Information 2014 [place], [place]. DASH Diet The DASH diet stands for "Dietary  Approaches to Stop Hypertension." It is a healthy eating plan that has been shown to reduce high blood pressure (hypertension) in as little as 14 days, while also possibly providing other significant health benefits. These other health benefits include reducing the risk of breast cancer after menopause and reducing the  risk of type 2 diabetes, heart disease, colon cancer, and stroke. Health benefits also include weight loss and slowing kidney failure in patients with chronic kidney disease.  DIET GUIDELINES  Limit salt (sodium). Your diet should contain less than 1500 mg of sodium daily.  Limit refined or processed carbohydrates. Your diet should include mostly whole grains. Desserts and added sugars should be used sparingly.  Include small amounts of heart-healthy fats. These types of fats include nuts, oils, and tub margarine. Limit saturated and trans fats. These fats have been shown to be harmful in the body. CHOOSING FOODS  The following food groups are based on a 2000 calorie diet. See your Registered Dietitian for individual calorie needs. Grains and Grain Products (6 to 8 servings daily)  Eat More Often: Whole-wheat bread, brown rice, whole-grain or wheat pasta, quinoa, popcorn without added fat or salt (air popped).  Eat Less Often: White bread, white pasta, white rice, cornbread. Vegetables (4 to 5 servings daily)  Eat More Often: Fresh, frozen, and canned vegetables. Vegetables may be raw, steamed, roasted, or grilled with a minimal amount of fat.  Eat Less Often/Avoid: Creamed or fried vegetables. Vegetables in a cheese sauce. Fruit (4 to 5 servings daily)  Eat More Often: All fresh, canned (in natural juice), or frozen fruits. Dried fruits without added sugar. One hundred percent fruit juice ( cup [237 mL] daily).  Eat Less Often: Dried fruits with added sugar. Canned fruit in light or heavy syrup. [employer], Fish, and Poultry (2 servings or less daily. One serving is 3 to 4 oz [85-114 g]).  Eat More Often: Ninety percent or leaner ground beef, tenderloin, sirloin. Round cuts of beef, chicken breast, [place] breast. All fish. Grill, bake, or broil your meat. Nothing should be fried.  Eat Less Often/Avoid: Fatty cuts of meat, [place], or chicken leg, thigh, or wing. Fried cuts of meat or  fish. Dairy (2 to 3 servings)  Eat More Often: Low-fat or fat-free milk, low-fat plain or light yogurt, reduced-fat or part-skim cheese.  Eat Less Often/Avoid: Milk (whole, 2%).Whole milk yogurt. Full-fat cheeses. Nuts, Seeds, and Legumes (4 to 5 servings per week)  Eat More Often: All without added salt.  Eat Less Often/Avoid: Salted nuts and seeds, canned beans with added salt. Fats and Sweets (limited)  Eat More Often: Vegetable oils, tub margarines without trans fats, sugar-free gelatin. Mayonnaise and salad dressings.  Eat Less Often/Avoid: Coconut oils, palm oils, butter, stick margarine, cream, half and half, cookies, candy, pie. FOR MORE INFORMATION The Dash Diet Eating Plan: www.dashdiet.org Document Released: 10/12/2011 Document Revised: 01/15/2012 Document Reviewed: 10/12/2011 [facility] Patient Information 2013 [place], [place].

## 2013-03-26 NOTE — Telephone Encounter (Signed)
Labs prior to next visit lipid, A1c, TSH, CBC, liver, and renal  Patient has appointment on 06/30/13. He will be going to [facility] lab

## 2013-03-28 NOTE — Progress Notes (Signed)
Patient ID: Troy Ray, male   DOB: 07-11-1928, 79 y.o.   MRN: [id number] Troy Ray [id number] 1928-05-07 03/28/2013      Progress Note-Follow Up  Subjective  Chief Complaint  Chief Complaint  Patient presents with  . Follow-up    3 month    HPI  Patient is an 79 year old male in today for followup. Generally feeling well. No recent illness. No fevers, chills,, chest pain, palpitations, shortness of breath, GI or GU complaints. He had 2 episodes lasting less than 30 seconds each of feeling flushed and poorly. No chest pain or palpitations at that time. He does believe he hadn't eaten for a while before each of these episodes.  Past Medical History  Diagnosis Date  . CAD (coronary artery disease)   . Hypertension   . Chronic kidney disease     chronic, stage II  . Hyperlipidemia   . PVD (peripheral vascular disease)   . Postural lightheadedness   . Memory loss   . Dysphagia   . Esophageal stricture     Dr [name]  . CRI (chronic renal insufficiency)   . Sensorineural hearing loss, bilateral   . Carotid stenosis, bilateral     right 60-79% stenosed.  Left 40-59%  . Adenomatous colon polyp   . Diverticulosis   . Unspecified constipation 12/29/2012    Past Surgical History  Procedure Laterality Date  . Hernia repair  03/23/05    left inguinal Lichtenstein repair, with mesh.  Claud Kelp MD  . Abdominal aortic aneurysm repair  1996    Dr [name]  . Renal artery stent  1996    Dr [name].  Bilateral stenting    Family History  Problem Relation Age of Onset  . Heart disease Father     CAD  . Cancer Neg Hx   . Diabetes Neg Hx   . Coronary artery disease Father     History   Social History  . Marital Status: Married    Spouse Name: N/A    Number of Children: 3  . Years of Education: N/A   Occupational History  . MEDIA ASSISTANT    Social History Main Topics  . Smoking status: Former Games developer  . Smokeless tobacco: Never Used     Comment: quit 1980  .  Alcohol Use: Yes  . Drug Use: Not on file  . Sexually Active: Not on file   Other Topics Concern  . Not on file   Social History Narrative  . No narrative on file    Current Outpatient Prescriptions on File Prior to Visit  Medication Sig Dispense Refill  . aspirin EC 81 MG EC tablet Take 1 tablet (81 mg total) by mouth daily.      . CRESTOR 20 MG tablet TAKE 1 TABLET (20 MG TOTAL) BY MOUTH DAILY.  90 tablet  1  . finasteride (PROSCAR) 5 MG tablet TAKE 1 TABLET (5 MG TOTAL) BY MOUTH DAILY.  90 tablet  1  . losartan (COZAAR) 25 MG tablet Take 1 tablet (25 mg total) by mouth daily.  90 tablet  1  . metoprolol succinate (TOPROL-XL) 25 MG 24 hr tablet TAKE 1 TABLET BY MOUTH DAILY  90 tablet  1  . pantoprazole (PROTONIX) 40 MG tablet TAKE 1 TABLET (40 MG TOTAL) BY MOUTH DAILY.  90 tablet  1   No current facility-administered medications on file prior to visit.    No Known Allergies  Review of Systems  Review of Systems  Constitutional: Negative for fever and malaise/fatigue.  HENT: Negative for congestion.   Eyes: Negative for discharge.  Respiratory: Negative for shortness of breath.   Cardiovascular: Negative for chest pain, palpitations and leg swelling.  Gastrointestinal: Negative for nausea, abdominal pain and diarrhea.  Genitourinary: Negative for dysuria.  Musculoskeletal: Negative for falls.  Skin: Negative for rash.  Neurological: Negative for loss of consciousness and headaches.  Endo/Heme/Allergies: Negative for polydipsia.  Psychiatric/Behavioral: Negative for depression and suicidal ideas. The patient is not nervous/anxious and does not have insomnia.     Objective  BP 126/78  Pulse 56  Temp(Src) 98.2 F (36.8 C) (Oral)  Ht 5\' 10"  (1.778 m)  Wt 186 lb 0.6 oz (84.387 kg)  BMI 26.69 kg/m2  SpO2 97%  Physical Exam  Physical Exam  Constitutional: He is oriented to person, place, and time and well-developed, well-nourished, and in no distress. No distress.   HENT:  Head: Normocephalic and atraumatic.  Eyes: Conjunctivae are normal.  Neck: Neck supple. No thyromegaly present.  Cardiovascular: Normal rate, regular rhythm and normal heart sounds.   No murmur heard. Pulmonary/Chest: Effort normal and breath sounds normal. No respiratory distress.  Abdominal: He exhibits no distension and no mass. There is no tenderness.  Musculoskeletal: He exhibits no edema.  Neurological: He is alert and oriented to person, place, and time.  Skin: Skin is warm.  Psychiatric: Memory, affect and judgment normal.    Lab Results  Component Value Date   TSH 2.734 12/16/2012   Lab Results  Component Value Date   WBC 7.3 12/16/2012   HGB 14.2 12/16/2012   HCT 41.7 12/16/2012   MCV 86.2 12/16/2012   PLT 157 12/16/2012   Lab Results  Component Value Date   CREATININE 1.26 12/16/2012   BUN 22 12/16/2012   NA 139 12/16/2012   K 5.0 12/16/2012   CL 107 12/16/2012   CO2 24 12/16/2012   Lab Results  Component Value Date   ALT 15 12/16/2012   AST 23 12/16/2012   ALKPHOS 42 12/16/2012   BILITOT 0.6 12/16/2012   Lab Results  Component Value Date   CHOL 128 12/16/2012   Lab Results  Component Value Date   HDL 37* 12/16/2012   Lab Results  Component Value Date   LDLCALC 73 12/16/2012   Lab Results  Component Value Date   TRIG 90 12/16/2012   Lab Results  Component Value Date   CHOLHDL 3.5 12/16/2012     Assessment & Plan  POSTURAL LIGHTHEADEDNESS Only 2 brief episodes of feeling flushed while lying in bed  HYPERTENSION Well controlled no changes.   HYPERLIPIDEMIA Tolerating Crestor, avoid trans fats, add krill oil   HYPERGLYCEMIA Had 2 episodes of feeling flushed when he had not eaten, lasted only seconds, encouraged small frequent meals with proteins and no simple carbs  Unspecified constipation Bowels moving better with probiotics

## 2013-03-28 NOTE — Assessment & Plan Note (Signed)
Tolerating Crestor, avoid trans fats, add krill oil

## 2013-03-28 NOTE — Assessment & Plan Note (Signed)
Well controlled no changes 

## 2013-03-28 NOTE — Assessment & Plan Note (Signed)
Bowels moving better with probiotics

## 2013-03-28 NOTE — Assessment & Plan Note (Signed)
Had 2 episodes of feeling flushed when he had not eaten, lasted only seconds, encouraged small frequent meals with proteins and no simple carbs

## 2013-04-04 ENCOUNTER — Other Ambulatory Visit: Payer: Self-pay | Admitting: Internal Medicine

## 2013-04-15 ENCOUNTER — Other Ambulatory Visit: Payer: Self-pay | Admitting: Internal Medicine

## 2013-05-07 ENCOUNTER — Encounter: Payer: Self-pay | Admitting: Cardiology

## 2013-05-07 ENCOUNTER — Ambulatory Visit (INDEPENDENT_AMBULATORY_CARE_PROVIDER_SITE_OTHER): Payer: Medicare Other | Admitting: Cardiology

## 2013-05-07 VITALS — BP 132/90 | HR 62 | Ht 70.0 in | Wt 185.0 lb

## 2013-05-07 DIAGNOSIS — I10 Essential (primary) hypertension: Secondary | ICD-10-CM

## 2013-05-07 DIAGNOSIS — N182 Chronic kidney disease, stage 2 (mild): Secondary | ICD-10-CM

## 2013-05-07 DIAGNOSIS — Z9889 Other specified postprocedural states: Secondary | ICD-10-CM

## 2013-05-07 DIAGNOSIS — I2510 Atherosclerotic heart disease of native coronary artery without angina pectoris: Secondary | ICD-10-CM

## 2013-05-07 DIAGNOSIS — I6529 Occlusion and stenosis of unspecified carotid artery: Secondary | ICD-10-CM

## 2013-05-07 DIAGNOSIS — E785 Hyperlipidemia, unspecified: Secondary | ICD-10-CM

## 2013-05-07 NOTE — Patient Instructions (Addendum)
Your physician recommends that you continue on your current medications as directed. Please refer to the Current Medication list given to you today.  Your physician wants you to follow-up in: 1 year with Dr. Olga Millers at [facility] in [facility].   You will receive a reminder letter in the mail two months in advance. If you don't receive a letter, please call our office to schedule the follow-up appointment.

## 2013-05-07 NOTE — Assessment & Plan Note (Signed)
I have scheduled carotid Dopplers. Continue secondary preventative therapy.

## 2013-05-07 NOTE — Assessment & Plan Note (Signed)
Stable. Continue secondary preventative therapy. We'll schedule followup with Dr. Jens Som in [facility].

## 2013-05-07 NOTE — Progress Notes (Signed)
HPI Troy Ray returns today for evaluation and management of his coronary artery disease, carotid artery disease, and history of abdominal aortic aneurysm repair.  Other than some unsteadiness on his stay, he has no complaints. He denies any angina or ischemic symptoms. He has no claudication or symptoms of TIAs. He is very compliant with his medications. He is still driving. He would  like to be seen in [facility] in the  future.  Past Medical History  Diagnosis Date  . CAD (coronary artery disease)   . Hypertension   . Chronic kidney disease     chronic, stage II  . Hyperlipidemia   . PVD (peripheral vascular disease)   . Postural lightheadedness   . Memory loss   . Dysphagia   . Esophageal stricture     Dr [name]  . CRI (chronic renal insufficiency)   . Sensorineural hearing loss, bilateral   . Carotid stenosis, bilateral     right 60-79% stenosed.  Left 40-59%  . Adenomatous colon polyp   . Diverticulosis   . Unspecified constipation 12/29/2012    Current Outpatient Prescriptions  Medication Sig Dispense Refill  . aspirin EC 81 MG EC tablet Take 1 tablet (81 mg total) by mouth daily.      . finasteride (PROSCAR) 5 MG tablet TAKE 1 TABLET (5 MG TOTAL) BY MOUTH DAILY.  90 tablet  1  . losartan (COZAAR) 25 MG tablet Take 1 tablet (25 mg total) by mouth daily.  90 tablet  1  . metoprolol succinate (TOPROL-XL) 25 MG 24 hr tablet TAKE 1 TABLET BY MOUTH DAILY  90 tablet  1  . pantoprazole (PROTONIX) 40 MG tablet TAKE 1 TABLET (40 MG TOTAL) BY MOUTH DAILY.  90 tablet  1  . rosuvastatin (CRESTOR) 20 MG tablet Take 1 tablet (20 mg total) by mouth daily.  90 tablet  1   No current facility-administered medications for this visit.    No Known Allergies  Family History  Problem Relation Age of Onset  . Heart disease Father     CAD  . Cancer Neg Hx   . Diabetes Neg Hx   . Coronary artery disease Father     History   Social History  . Marital Status: Married    Spouse Name:  N/A    Number of Children: 3  . Years of Education: N/A   Occupational History  . MEDIA ASSISTANT    Social History Main Topics  . Smoking status: Former Games developer  . Smokeless tobacco: Never Used     Comment: quit 1980  . Alcohol Use: Yes  . Drug Use: Not on file  . Sexually Active: Not on file   Other Topics Concern  . Not on file   Social History Narrative  . No narrative on file    ROS ALL NEGATIVE EXCEPT THOSE NOTED IN HPI  PE  General Appearance: well developed, well nourished in no acute distress, elderly but acts much younger than stated age HEENT: symmetrical face, PERRLA, good dentition  Neck: no JVD, thyromegaly, or adenopathy, trachea midline Chest: symmetric without deformity Cardiac: PMI non-displaced, RRR, normal S1, S2, no gallop or murmur Lung: clear to ausculation and percussion Vascular: Pulses full. Right carotid bruit  Abdominal: nondistended, nontender, good bowel sounds, no HSM, no bruits Extremities: no cyanosis, clubbing or edema, no sign of DVT, no varicosities  Skin: normal color, no rashes Neuro: alert and oriented x 3, non-focal Pysch: normal affect  EKG Normal sinus rhythm,  normal EKG BMET    Component Value Date/Time   NA 139 12/16/2012 1448   K 5.0 12/16/2012 1448   CL 107 12/16/2012 1448   CO2 24 12/16/2012 1448   GLUCOSE 101* 12/16/2012 1448   BUN 22 12/16/2012 1448   CREATININE 1.26 12/16/2012 1448   CREATININE 1.47 11/17/2010 1902   CALCIUM 8.9 12/16/2012 1448   GFRNONAA 51* 10/27/2010 2229   GFRAA  Value: >60        The eGFR has been calculated using the MDRD equation. This calculation has not been validated in all clinical situations. eGFR's persistently <60 mL/min signify possible Chronic Kidney Disease. 10/27/2010 2229    Lipid Panel     Component Value Date/Time   CHOL 128 12/16/2012 1448   TRIG 90 12/16/2012 1448   HDL 37* 12/16/2012 1448   CHOLHDL 3.5 12/16/2012 1448   VLDL 18 12/16/2012 1448   LDLCALC 73 12/16/2012 1448     CBC    Component Value Date/Time   WBC 7.3 12/16/2012 1448   RBC 4.84 12/16/2012 1448   HGB 14.2 12/16/2012 1448   HCT 41.7 12/16/2012 1448   PLT 157 12/16/2012 1448   MCV 86.2 12/16/2012 1448   MCH 29.3 12/16/2012 1448   MCHC 34.1 12/16/2012 1448   RDW 14.4 12/16/2012 1448   LYMPHSABS 1.6 12/16/2012 1448   MONOABS 0.8 12/16/2012 1448   EOSABS 0.3 12/16/2012 1448   BASOSABS 0.0 12/16/2012 1448

## 2013-05-19 ENCOUNTER — Other Ambulatory Visit: Payer: Self-pay | Admitting: Internal Medicine

## 2013-05-22 ENCOUNTER — Encounter (INDEPENDENT_AMBULATORY_CARE_PROVIDER_SITE_OTHER): Payer: Medicare Other

## 2013-05-22 DIAGNOSIS — I6529 Occlusion and stenosis of unspecified carotid artery: Secondary | ICD-10-CM

## 2013-05-27 ENCOUNTER — Encounter: Payer: Self-pay | Admitting: Cardiology

## 2013-05-27 NOTE — Telephone Encounter (Signed)
Pt rtn call re test results, pls call

## 2013-05-27 NOTE — Telephone Encounter (Signed)
This encounter was created in error - please disregard.

## 2013-06-26 ENCOUNTER — Other Ambulatory Visit: Payer: Self-pay | Admitting: Family Medicine

## 2013-06-30 ENCOUNTER — Ambulatory Visit: Payer: Medicare Other | Admitting: Family Medicine

## 2013-07-04 ENCOUNTER — Other Ambulatory Visit: Payer: Self-pay | Admitting: Family Medicine

## 2013-07-04 LAB — RENAL FUNCTION PANEL
Albumin: 4.3 g/dL (ref 3.5–5.2)
CO2: 25 mEq/L (ref 19–32)
Creat: 1.54 mg/dL — ABNORMAL HIGH (ref 0.50–1.35)

## 2013-07-04 LAB — LIPID PANEL
HDL: 39 mg/dL — ABNORMAL LOW (ref >39)
LDL Cholesterol: 74 mg/dL (ref 0–99)
Triglycerides: 166 mg/dL — ABNORMAL HIGH (ref <150)
VLDL: 33 mg/dL (ref 0–40)

## 2013-07-04 LAB — CBC
HCT: 44.4 % (ref 39.0–52.0)
Hemoglobin: 14.7 g/dL (ref 13.0–17.0)
RBC: 5.08 MIL/uL (ref 4.22–5.81)
WBC: 7.8 10*3/uL (ref 4.0–10.5)

## 2013-07-04 LAB — TSH: TSH: 3.883 u[IU]/mL (ref 0.350–4.500)

## 2013-07-11 ENCOUNTER — Ambulatory Visit (INDEPENDENT_AMBULATORY_CARE_PROVIDER_SITE_OTHER): Payer: Medicare Other | Admitting: Family Medicine

## 2013-07-11 ENCOUNTER — Encounter: Payer: Self-pay | Admitting: Family Medicine

## 2013-07-11 ENCOUNTER — Telehealth: Payer: Self-pay | Admitting: Family Medicine

## 2013-07-11 VITALS — BP 122/72 | HR 59 | Temp 97.6°F | Ht 70.0 in | Wt 186.1 lb

## 2013-07-11 DIAGNOSIS — E785 Hyperlipidemia, unspecified: Secondary | ICD-10-CM

## 2013-07-11 DIAGNOSIS — Z23 Encounter for immunization: Secondary | ICD-10-CM

## 2013-07-11 DIAGNOSIS — I10 Essential (primary) hypertension: Secondary | ICD-10-CM

## 2013-07-11 DIAGNOSIS — N182 Chronic kidney disease, stage 2 (mild): Secondary | ICD-10-CM

## 2013-07-11 DIAGNOSIS — R7309 Other abnormal glucose: Secondary | ICD-10-CM

## 2013-07-11 DIAGNOSIS — I2510 Atherosclerotic heart disease of native coronary artery without angina pectoris: Secondary | ICD-10-CM

## 2013-07-11 DIAGNOSIS — K5900 Constipation, unspecified: Secondary | ICD-10-CM

## 2013-07-11 DIAGNOSIS — N400 Enlarged prostate without lower urinary tract symptoms: Secondary | ICD-10-CM

## 2013-07-11 NOTE — Telephone Encounter (Signed)
Lab order week of 12-1 Check out comments: Labs prior to visit, lipid, renal, cbc, tsh, hepatic, psa, hgba1c, urinalysis and culture.

## 2013-07-11 NOTE — Patient Instructions (Addendum)
Start a krill oil capsule called MegaRed or a generic every day Start a probiotic daily such as Digestive Advantage  Melatonin for sleep 5-10 mg  Hypertension As your heart beats, it forces blood through your arteries. This force is your blood pressure. If the pressure is too high, it is called hypertension (HTN) or high blood pressure. HTN is dangerous because you may have it and not know it. High blood pressure may mean that your heart has to work harder to pump blood. Your arteries may be narrow or stiff. The extra work puts you at risk for heart disease, stroke, and other problems.  Blood pressure consists of two numbers, a higher number over a lower, 110/72, for example. It is stated as "110 over 72." The ideal is below 120 for the top number (systolic) and under 80 for the bottom (diastolic). Write down your blood pressure today. You should pay close attention to your blood pressure if you have certain conditions such as:  Heart failure.  Prior heart attack.  Diabetes  Chronic kidney disease.  Prior stroke.  Multiple risk factors for heart disease. To see if you have HTN, your blood pressure should be measured while you are seated with your arm held at the level of the heart. It should be measured at least twice. A one-time elevated blood pressure reading (especially in the Emergency Department) does not mean that you need treatment. There may be conditions in which the blood pressure is different between your right and left arms. It is important to see your caregiver soon for a recheck. Most people have essential hypertension which means that there is not a specific cause. This type of high blood pressure may be lowered by changing lifestyle factors such as:  Stress.  Smoking.  Lack of exercise.  Excessive weight.  Drug/tobacco/alcohol use.  Eating less salt. Most people do not have symptoms from high blood pressure until it has caused damage to the body. Effective treatment  can often prevent, delay or reduce that damage. TREATMENT  When a cause has been identified, treatment for high blood pressure is directed at the cause. There are a large number of medications to treat HTN. These fall into several categories, and your caregiver will help you select the medicines that are best for you. Medications may have side effects. You should review side effects with your caregiver. If your blood pressure stays high after you have made lifestyle changes or started on medicines,   Your medication(s) may need to be changed.  Other problems may need to be addressed.  Be certain you understand your prescriptions, and know how and when to take your medicine.  Be sure to follow up with your caregiver within the time frame advised (usually within two weeks) to have your blood pressure rechecked and to review your medications.  If you are taking more than one medicine to lower your blood pressure, make sure you know how and at what times they should be taken. Taking two medicines at the same time can result in blood pressure that is too low. SEEK IMMEDIATE MEDICAL CARE IF:  You develop a severe headache, blurred or changing vision, or confusion.  You have unusual weakness or numbness, or a faint feeling.  You have severe chest or abdominal pain, vomiting, or breathing problems. MAKE SURE YOU:   Understand these instructions.  Will watch your condition.  Will get help right away if you are not doing well or get worse. Document Released: 10/23/2005 Document  Revised: 01/15/2012 Document Reviewed: 06/12/2008 [facility] Patient Information 2014 [place], [place].

## 2013-07-14 ENCOUNTER — Encounter: Payer: Self-pay | Admitting: Family Medicine

## 2013-07-14 DIAGNOSIS — N400 Enlarged prostate without lower urinary tract symptoms: Secondary | ICD-10-CM

## 2013-07-14 HISTORY — DX: Benign prostatic hyperplasia without lower urinary tract symptoms: N40.0

## 2013-07-14 NOTE — Assessment & Plan Note (Signed)
Well controlled, no changes to meds 

## 2013-07-14 NOTE — Assessment & Plan Note (Signed)
Tolerating crestor, consider krill oil caps

## 2013-07-14 NOTE — Assessment & Plan Note (Signed)
Encouraged to take probiotics and fiber.

## 2013-07-14 NOTE — Progress Notes (Signed)
Patient ID: Troy Ray, male   DOB: [birth year], 79 y.o.   MRN: [id number] SABIAN KUBA [id number] 17-Mar-1928 07/14/2013      Progress Note-Follow Up  Subjective  Chief Complaint  Chief Complaint  Patient presents with  . Follow-up    3 month    HPI  Patient is an 79 year old Caucasian male who is in today for followup. He feels well. He's not had any recent illness. Denies any chest pain. No palpitations, shortness or breath, GI complaints. Is taking medications as prescribed. Agrees to flu shot today. Has some urinary hesitancy.  Past Medical History  Diagnosis Date  . CAD (coronary artery disease)   . Hypertension   . Chronic kidney disease     chronic, stage II  . Hyperlipidemia   . PVD (peripheral vascular disease)   . Postural lightheadedness   . Memory loss   . Dysphagia   . Esophageal stricture     Dr [name]  . CRI (chronic renal insufficiency)   . Sensorineural hearing loss, bilateral   . Carotid stenosis, bilateral     right 60-79% stenosed.  Left 40-59%  . Adenomatous colon polyp   . Diverticulosis   . Unspecified constipation 12/29/2012  . BPH (benign prostatic hyperplasia) 07/14/2013    Past Surgical History  Procedure Laterality Date  . Hernia repair  03/23/05    left inguinal Lichtenstein repair, with mesh.  Claud Kelp MD  . Abdominal aortic aneurysm repair  1996    Dr [name]  . Renal artery stent  1996    Dr [name].  Bilateral stenting    Family History  Problem Relation Age of Onset  . Heart disease Father     CAD  . Cancer Neg Hx   . Diabetes Neg Hx   . Coronary artery disease Father     History   Social History  . Marital Status: Married    Spouse Name: N/A    Number of Children: 3  . Years of Education: N/A   Occupational History  . MEDIA ASSISTANT    Social History Main Topics  . Smoking status: Former Games developer  . Smokeless tobacco: Never Used     Comment: quit 1980  . Alcohol Use: Yes  . Drug Use: Not on file  . Sexual  Activity: Not on file   Other Topics Concern  . Not on file   Social History Narrative  . No narrative on file    Current Outpatient Prescriptions on File Prior to Visit  Medication Sig Dispense Refill  . aspirin EC 81 MG EC tablet Take 1 tablet (81 mg total) by mouth daily.      . finasteride (PROSCAR) 5 MG tablet TAKE 1 TABLET (5 MG TOTAL) BY MOUTH DAILY.  90 tablet  1  . losartan (COZAAR) 25 MG tablet Take 1 tablet (25 mg total) by mouth daily.  90 tablet  1  . metoprolol succinate (TOPROL-XL) 25 MG 24 hr tablet TAKE 1 TABLET BY MOUTH DAILY  90 tablet  1  . pantoprazole (PROTONIX) 40 MG tablet Take 1 tablet (40 mg total) by mouth daily.  90 tablet  1  . rosuvastatin (CRESTOR) 20 MG tablet Take 1 tablet (20 mg total) by mouth daily.  90 tablet  1   No current facility-administered medications on file prior to visit.    No Known Allergies  Review of Systems  Review of Systems  Constitutional: Negative for fever and malaise/fatigue.  HENT: Negative for congestion.  Eyes: Negative for discharge.  Respiratory: Negative for shortness of breath.   Cardiovascular: Negative for chest pain, palpitations and leg swelling.  Gastrointestinal: Negative for nausea, abdominal pain and diarrhea.  Genitourinary: Negative for dysuria.  Musculoskeletal: Negative for falls.  Skin: Negative for rash.  Neurological: Negative for loss of consciousness and headaches.  Endo/Heme/Allergies: Negative for polydipsia.  Psychiatric/Behavioral: Negative for depression and suicidal ideas. The patient is not nervous/anxious and does not have insomnia.     Objective  BP 122/72  Pulse 59  Temp(Src) 97.6 F (36.4 C) (Oral)  Ht 5\' 10"  (1.778 m)  Wt 186 lb 1.3 oz (84.405 kg)  BMI 26.7 kg/m2  SpO2 96%  Physical Exam  Physical Exam  Constitutional: He is oriented to person, place, and time and well-developed, well-nourished, and in no distress. No distress.  HENT:  Head: Normocephalic and  atraumatic.  Eyes: Conjunctivae are normal.  Neck: Neck supple. No thyromegaly present.  Cardiovascular: Normal rate, regular rhythm and normal heart sounds.   Pulmonary/Chest: Effort normal and breath sounds normal. No respiratory distress.  Abdominal: He exhibits no distension and no mass. There is no tenderness.  Musculoskeletal: He exhibits no edema.  Neurological: He is alert and oriented to person, place, and time.  Skin: Skin is warm.  Psychiatric: Memory, affect and judgment normal.    Lab Results  Component Value Date   TSH 3.883 07/04/2013   Lab Results  Component Value Date   WBC 7.8 07/04/2013   HGB 14.7 07/04/2013   HCT 44.4 07/04/2013   MCV 87.4 07/04/2013   PLT 204 07/04/2013   Lab Results  Component Value Date   CREATININE 1.54* 07/04/2013   BUN 27* 07/04/2013   NA 139 07/04/2013   K 4.8 07/04/2013   CL 105 07/04/2013   CO2 25 07/04/2013   Lab Results  Component Value Date   ALT 15 12/16/2012   AST 23 12/16/2012   ALKPHOS 42 12/16/2012   BILITOT 0.6 12/16/2012   Lab Results  Component Value Date   CHOL 146 07/04/2013   Lab Results  Component Value Date   HDL 39* 07/04/2013   Lab Results  Component Value Date   LDLCALC 74 07/04/2013   Lab Results  Component Value Date   TRIG 166* 07/04/2013   Lab Results  Component Value Date   CHOLHDL 3.7 07/04/2013     Assessment & Plan  HYPERTENSION Well controlled, no changes to meds.  Unspecified constipation Encouraged to take probiotics and fiber.  HYPERLIPIDEMIA Tolerating crestor, consider krill oil caps  BPH (benign prostatic hyperplasia) Finasteride

## 2013-07-14 NOTE — Assessment & Plan Note (Signed)
Finasteride

## 2013-08-29 ENCOUNTER — Other Ambulatory Visit: Payer: Self-pay | Admitting: Family Medicine

## 2013-08-29 NOTE — Telephone Encounter (Signed)
Rx request to pharmacy/[facility]  

## 2013-09-30 ENCOUNTER — Other Ambulatory Visit: Payer: Self-pay | Admitting: Family Medicine

## 2013-10-06 LAB — RENAL FUNCTION PANEL
Albumin: 4.1 g/dL (ref 3.5–5.2)
Calcium: 9.3 mg/dL (ref 8.4–10.5)
Chloride: 105 mEq/L (ref 96–112)
Creat: 1.28 mg/dL (ref 0.50–1.35)
Phosphorus: 3.1 mg/dL (ref 2.3–4.6)

## 2013-10-06 LAB — HEPATIC FUNCTION PANEL
Bilirubin, Direct: 0.1 mg/dL (ref 0.0–0.3)
Total Bilirubin: 0.7 mg/dL (ref 0.3–1.2)

## 2013-10-06 LAB — LIPID PANEL
LDL Cholesterol: 69 mg/dL (ref 0–99)
Triglycerides: 118 mg/dL (ref <150)
VLDL: 24 mg/dL (ref 0–40)

## 2013-10-06 LAB — HEMOGLOBIN A1C
Hgb A1c MFr Bld: 6.4 % — ABNORMAL HIGH (ref <5.7)
Mean Plasma Glucose: 137 mg/dL — ABNORMAL HIGH (ref <117)

## 2013-10-06 LAB — CBC
HCT: 43.2 % (ref 39.0–52.0)
Platelets: 176 10*3/uL (ref 150–400)
RDW: 14.5 % (ref 11.5–15.5)
WBC: 10.7 10*3/uL — ABNORMAL HIGH (ref 4.0–10.5)

## 2013-10-06 LAB — TSH: TSH: 2.751 u[IU]/mL (ref 0.350–4.500)

## 2013-10-07 LAB — MICROALBUMIN, URINE: Microalb, Ur: 0.87 mg/dL (ref 0.00–1.89)

## 2013-10-10 ENCOUNTER — Encounter: Payer: Self-pay | Admitting: Family Medicine

## 2013-10-10 ENCOUNTER — Ambulatory Visit (INDEPENDENT_AMBULATORY_CARE_PROVIDER_SITE_OTHER): Payer: Medicare Other | Admitting: Family Medicine

## 2013-10-10 VITALS — BP 132/72 | HR 65 | Temp 97.7°F | Ht 70.0 in | Wt 184.0 lb

## 2013-10-10 DIAGNOSIS — I2510 Atherosclerotic heart disease of native coronary artery without angina pectoris: Secondary | ICD-10-CM

## 2013-10-10 DIAGNOSIS — N400 Enlarged prostate without lower urinary tract symptoms: Secondary | ICD-10-CM

## 2013-10-10 DIAGNOSIS — E785 Hyperlipidemia, unspecified: Secondary | ICD-10-CM

## 2013-10-10 DIAGNOSIS — R7309 Other abnormal glucose: Secondary | ICD-10-CM

## 2013-10-10 DIAGNOSIS — J209 Acute bronchitis, unspecified: Secondary | ICD-10-CM

## 2013-10-10 DIAGNOSIS — J329 Chronic sinusitis, unspecified: Secondary | ICD-10-CM

## 2013-10-10 DIAGNOSIS — K219 Gastro-esophageal reflux disease without esophagitis: Secondary | ICD-10-CM

## 2013-10-10 DIAGNOSIS — I10 Essential (primary) hypertension: Secondary | ICD-10-CM

## 2013-10-10 MED ORDER — PANTOPRAZOLE SODIUM 40 MG PO TBEC: 40.0000 mg | DELAYED_RELEASE_TABLET | Freq: Every day | ORAL | Status: DC

## 2013-10-10 MED ORDER — ALBUTEROL SULFATE HFA 108 (90 BASE) MCG/ACT IN AERS: 2.0000 | INHALATION_SPRAY | Freq: Four times a day (QID) | RESPIRATORY_TRACT | Status: DC | PRN

## 2013-10-10 MED ORDER — METOPROLOL SUCCINATE ER 25 MG PO TB24: 25.0000 mg | ORAL_TABLET | Freq: Every day | ORAL | Status: DC

## 2013-10-10 MED ORDER — ROSUVASTATIN CALCIUM 20 MG PO TABS: 20.0000 mg | ORAL_TABLET | Freq: Every day | ORAL | Status: DC

## 2013-10-10 MED ORDER — LOSARTAN POTASSIUM 25 MG PO TABS: 25.0000 mg | ORAL_TABLET | Freq: Every day | ORAL | Status: DC

## 2013-10-10 MED ORDER — FINASTERIDE 5 MG PO TABS: 5.0000 mg | ORAL_TABLET | Freq: Every day | ORAL | Status: DC

## 2013-10-10 MED ORDER — BENZONATATE 100 MG PO CAPS: 100.0000 mg | ORAL_CAPSULE | Freq: Two times a day (BID) | ORAL | Status: DC | PRN

## 2013-10-10 MED ORDER — CEFDINIR 300 MG PO CAPS: 300.0000 mg | ORAL_CAPSULE | Freq: Two times a day (BID) | ORAL | Status: AC

## 2013-10-10 NOTE — Progress Notes (Signed)
Pre visit review using our clinic review tool, if applicable. No additional management support is needed unless otherwise documented below in the visit note. 

## 2013-10-10 NOTE — Patient Instructions (Signed)
Probiotic, Zinc product (Coldeeze has zinc) Plain Mucinex 600 mg po twice a day  Sinusitis Sinusitis is redness, soreness, and swelling (inflammation) of the paranasal sinuses. Paranasal sinuses are air pockets within the bones of your face (beneath the eyes, the middle of the forehead, or above the eyes). In healthy paranasal sinuses, mucus is able to drain out, and air is able to circulate through them by way of your nose. However, when your paranasal sinuses are inflamed, mucus and air can become trapped. This can allow bacteria and other germs to grow and cause infection. Sinusitis can develop quickly and last only a short time (acute) or continue over a long period (chronic). Sinusitis that lasts for more than 12 weeks is considered chronic.  CAUSES  Causes of sinusitis include:  Allergies.  Structural abnormalities, such as displacement of the cartilage that separates your nostrils (deviated septum), which can decrease the air flow through your nose and sinuses and affect sinus drainage.  Functional abnormalities, such as when the small hairs (cilia) that line your sinuses and help remove mucus do not work properly or are not present. SYMPTOMS  Symptoms of acute and chronic sinusitis are the same. The primary symptoms are pain and pressure around the affected sinuses. Other symptoms include:  Upper toothache.  Earache.  Headache.  Bad breath.  Decreased sense of smell and taste.  A cough, which worsens when you are lying flat.  Fatigue.  Fever.  Thick drainage from your nose, which often is green and may contain pus (purulent).  Swelling and warmth over the affected sinuses. DIAGNOSIS  Your caregiver will perform a physical exam. During the exam, your caregiver may:  Look in your nose for signs of abnormal growths in your nostrils (nasal polyps).  Tap over the affected sinus to check for signs of infection.  View the inside of your sinuses (endoscopy) with a special  imaging device with a light attached (endoscope), which is inserted into your sinuses. If your caregiver suspects that you have chronic sinusitis, one or more of the following tests may be recommended:  Allergy tests.  Nasal culture A sample of mucus is taken from your nose and sent to a lab and screened for bacteria.  Nasal cytology A sample of mucus is taken from your nose and examined by your caregiver to determine if your sinusitis is related to an allergy. TREATMENT  Most cases of acute sinusitis are related to a viral infection and will resolve on their own within 10 days. Sometimes medicines are prescribed to help relieve symptoms (pain medicine, decongestants, nasal steroid sprays, or saline sprays).  However, for sinusitis related to a bacterial infection, your caregiver will prescribe antibiotic medicines. These are medicines that will help kill the bacteria causing the infection.  Rarely, sinusitis is caused by a fungal infection. In theses cases, your caregiver will prescribe antifungal medicine. For some cases of chronic sinusitis, surgery is needed. Generally, these are cases in which sinusitis recurs more than 3 times per year, despite other treatments. HOME CARE INSTRUCTIONS   Drink plenty of water. Water helps thin the mucus so your sinuses can drain more easily.  Use a humidifier.  Inhale steam 3 to 4 times a day (for example, sit in the bathroom with the shower running).  Apply a warm, moist washcloth to your face 3 to 4 times a day, or as directed by your caregiver.  Use saline nasal sprays to help moisten and clean your sinuses.  Take over-the-counter or prescription  medicines for pain, discomfort, or fever only as directed by your caregiver. SEEK IMMEDIATE MEDICAL CARE IF:  You have increasing pain or severe headaches.  You have nausea, vomiting, or drowsiness.  You have swelling around your face.  You have vision problems.  You have a stiff neck.  You have  difficulty breathing. MAKE SURE YOU:   Understand these instructions.  Will watch your condition.  Will get help right away if you are not doing well or get worse. Document Released: 10/23/2005 Document Revised: 01/15/2012 Document Reviewed: 11/07/2011 [facility] Patient Information 2014 [place], [place].

## 2013-10-10 NOTE — Progress Notes (Signed)
Patient ID: Troy Ray, male   DOB: Jun 08, 1928, 79 y.o.   MRN: [id number] Troy Ray [id number] 31-Mar-1928 10/10/2013      Progress Note-Follow Up  Subjective  Chief Complaint  Chief Complaint  Patient presents with  . Follow-up    3 month    HPI  Patient is an 79 year old male who is in today for followup. Has been struggling with worsening congestion for the last 3 days. Struggling with a cough productive of some brown phlegm as well as sore throat fatigue malaise and chills. Denies fevers and headaches. Denies ear pain but does have some a.m. nausea. Denies chest pain or palpitations. No GI complaints otherwise. Taking medications as prescribed.  Past Medical History  Diagnosis Date  . CAD (coronary artery disease)   . Hypertension   . Chronic kidney disease     chronic, stage II  . Hyperlipidemia   . PVD (peripheral vascular disease)   . Postural lightheadedness   . Memory loss   . Dysphagia   . Esophageal stricture     Dr [name]  . CRI (chronic renal insufficiency)   . Sensorineural hearing loss, bilateral   . Carotid stenosis, bilateral     right 60-79% stenosed.  Left 40-59%  . Adenomatous colon polyp   . Diverticulosis   . Unspecified constipation 12/29/2012  . BPH (benign prostatic hyperplasia) 07/14/2013    Past Surgical History  Procedure Laterality Date  . Hernia repair  03/23/05    left inguinal Lichtenstein repair, with mesh.  Claud Kelp MD  . Abdominal aortic aneurysm repair  1996    Dr [name]  . Renal artery stent  1996    Dr [name].  Bilateral stenting    Family History  Problem Relation Age of Onset  . Heart disease Father     CAD  . Cancer Neg Hx   . Diabetes Neg Hx   . Coronary artery disease Father     History   Social History  . Marital Status: Married    Spouse Name: N/A    Number of Children: 3  . Years of Education: N/A   Occupational History  . MEDIA ASSISTANT    Social History Main Topics  . Smoking status:  Former Games developer  . Smokeless tobacco: Never Used     Comment: quit 1980  . Alcohol Use: Yes  . Drug Use: Not on file  . Sexual Activity: Not on file   Other Topics Concern  . Not on file   Social History Narrative  . No narrative on file    Current Outpatient Prescriptions on File Prior to Visit  Medication Sig Dispense Refill  . aspirin EC 81 MG EC tablet Take 1 tablet (81 mg total) by mouth daily.       No current facility-administered medications on file prior to visit.    No Known Allergies  Review of Systems  Review of Systems  Constitutional: Positive for malaise/fatigue. Negative for fever, chills and diaphoresis.  HENT: Positive for congestion and sore throat. Negative for ear pain.   Eyes: Negative for discharge.  Respiratory: Positive for cough and sputum production. Negative for shortness of breath.   Cardiovascular: Negative for chest pain, palpitations and leg swelling.  Gastrointestinal: Negative for nausea, abdominal pain and diarrhea.  Genitourinary: Negative for dysuria.  Musculoskeletal: Negative for falls, myalgias and neck pain.  Skin: Negative for rash.  Neurological: Negative for loss of consciousness, weakness and headaches.  Endo/Heme/Allergies: Negative for polydipsia.  Psychiatric/Behavioral: Negative for depression and suicidal ideas. The patient is not nervous/anxious and does not have insomnia.     Objective  BP 132/72  Pulse 65  Temp(Src) 97.7 F (36.5 C) (Oral)  Ht 5\' 10"  (1.778 m)  Wt 184 lb (83.462 kg)  BMI 26.40 kg/m2  SpO2 97%  Physical Exam  Physical Exam  Constitutional: He is oriented to person, place, and time and well-developed, well-nourished, and in no distress. No distress.  HENT:  Head: Normocephalic and atraumatic.  Oropharynx erythematous  Eyes: Conjunctivae are normal.  Neck: Neck supple. No thyromegaly present.  Cardiovascular: Normal rate, regular rhythm and normal heart sounds.   No murmur  heard. Pulmonary/Chest: Effort normal and breath sounds normal. No respiratory distress.  Abdominal: He exhibits no distension and no mass. There is no tenderness.  Musculoskeletal: He exhibits no edema.  Neurological: He is alert and oriented to person, place, and time.  Skin: Skin is warm.  Psychiatric: Memory, affect and judgment normal.    Lab Results  Component Value Date   TSH 2.751 10/06/2013   Lab Results  Component Value Date   WBC 10.7* 10/06/2013   HGB 14.5 10/06/2013   HCT 43.2 10/06/2013   MCV 89.6 10/06/2013   PLT 176 10/06/2013   Lab Results  Component Value Date   CREATININE 1.28 10/06/2013   BUN 16 10/06/2013   NA 137 10/06/2013   K 4.5 10/06/2013   CL 105 10/06/2013   CO2 25 10/06/2013   Lab Results  Component Value Date   ALT 16 10/06/2013   AST 21 10/06/2013   ALKPHOS 42 10/06/2013   BILITOT 0.7 10/06/2013   Lab Results  Component Value Date   CHOL 135 10/06/2013   Lab Results  Component Value Date   HDL 42 10/06/2013   Lab Results  Component Value Date   LDLCALC 69 10/06/2013   Lab Results  Component Value Date   TRIG 118 10/06/2013   Lab Results  Component Value Date   CHOLHDL 3.2 10/06/2013     Assessment & Plan  HYPERTENSION Well controlled no changes.  Sinusitis Started on Cefdinir Mucinex, probiotics. Increase rest and fluids.   CORONARY ARTERY DISEASE Asymptomatic at this time.  HYPERLIPIDEMIA Profile improved no changes, avoid trans fats.  HYPERGLYCEMIA hgba1c 6.4, minimize simple carbs and increase exercise

## 2013-10-13 ENCOUNTER — Ambulatory Visit: Payer: Medicare Other | Admitting: Family Medicine

## 2013-10-13 DIAGNOSIS — J329 Chronic sinusitis, unspecified: Secondary | ICD-10-CM | POA: Insufficient documentation

## 2013-10-13 NOTE — Assessment & Plan Note (Signed)
hgba1c 6.4, minimize simple carbs and increase exercise

## 2013-10-13 NOTE — Assessment & Plan Note (Signed)
Asymptomatic at this time 

## 2013-10-13 NOTE — Assessment & Plan Note (Signed)
Well controlled no changes 

## 2013-10-13 NOTE — Assessment & Plan Note (Signed)
Started on Cefdinir Mucinex, probiotics. Increase rest and fluids.

## 2013-10-13 NOTE — Assessment & Plan Note (Signed)
Profile improved no changes, avoid trans fats.

## 2013-10-28 ENCOUNTER — Telehealth: Payer: Self-pay | Admitting: Family Medicine

## 2013-10-28 DIAGNOSIS — N400 Enlarged prostate without lower urinary tract symptoms: Secondary | ICD-10-CM

## 2013-10-28 MED ORDER — FINASTERIDE 5 MG PO TABS: 5.0000 mg | ORAL_TABLET | Freq: Every day | ORAL | Status: DC

## 2013-10-28 NOTE — Telephone Encounter (Signed)
refill-finasteride 5mg  tablet. Take one tablet by mouth daily. Qty 90 last fill 9.8.14

## 2013-11-25 ENCOUNTER — Ambulatory Visit: Payer: Medicare Other | Admitting: Family Medicine

## 2013-11-26 ENCOUNTER — Ambulatory Visit (INDEPENDENT_AMBULATORY_CARE_PROVIDER_SITE_OTHER): Payer: Medicare HMO | Admitting: Family Medicine

## 2013-11-26 ENCOUNTER — Encounter: Payer: Self-pay | Admitting: Family Medicine

## 2013-11-26 VITALS — BP 158/70 | HR 59 | Temp 97.7°F | Ht 70.0 in | Wt 184.0 lb

## 2013-11-26 DIAGNOSIS — I10 Essential (primary) hypertension: Secondary | ICD-10-CM

## 2013-11-26 DIAGNOSIS — R51 Headache: Secondary | ICD-10-CM

## 2013-11-26 DIAGNOSIS — J329 Chronic sinusitis, unspecified: Secondary | ICD-10-CM

## 2013-11-26 MED ORDER — LOSARTAN POTASSIUM 50 MG PO TABS: 50.0000 mg | ORAL_TABLET | Freq: Every day | ORAL | Status: DC

## 2013-11-26 NOTE — Progress Notes (Signed)
Pre visit review using our clinic review tool, if applicable. No additional management support is needed unless otherwise documented below in the visit note. 
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
## 2013-11-26 NOTE — Patient Instructions (Signed)

## 2013-11-27 ENCOUNTER — Ambulatory Visit (HOSPITAL_BASED_OUTPATIENT_CLINIC_OR_DEPARTMENT_OTHER)
Admission: RE | Admit: 2013-11-27 | Discharge: 2013-11-27 | Disposition: A | Discharge status: Home / Self Care | Payer: Medicare HMO | Source: Ambulatory Visit | Attending: Family Medicine | Admitting: Family Medicine

## 2013-11-27 ENCOUNTER — Telehealth: Payer: Self-pay | Admitting: Family Medicine

## 2013-11-27 DIAGNOSIS — K219 Gastro-esophageal reflux disease without esophagitis: Secondary | ICD-10-CM

## 2013-11-27 DIAGNOSIS — J3489 Other specified disorders of nose and nasal sinuses: Secondary | ICD-10-CM | POA: Insufficient documentation

## 2013-11-27 DIAGNOSIS — I10 Essential (primary) hypertension: Secondary | ICD-10-CM | POA: Insufficient documentation

## 2013-11-27 DIAGNOSIS — R51 Headache: Secondary | ICD-10-CM | POA: Insufficient documentation

## 2013-11-27 DIAGNOSIS — G319 Degenerative disease of nervous system, unspecified: Secondary | ICD-10-CM | POA: Insufficient documentation

## 2013-11-27 MED ORDER — PANTOPRAZOLE SODIUM 40 MG PO TBEC: 40.0000 mg | DELAYED_RELEASE_TABLET | Freq: Every day | ORAL | Status: DC

## 2013-11-27 NOTE — Telephone Encounter (Signed)
refill-pantoprazole 40mg  tablet. Take one tablet by mouth every day. Qty 90 last fill 1.22.15

## 2013-11-28 NOTE — Progress Notes (Signed)
Quick Note:  Patients spouse informed and states pts headaches are still persisting.  Please advise? ______

## 2013-11-30 ENCOUNTER — Other Ambulatory Visit: Payer: Self-pay | Admitting: Family Medicine

## 2013-11-30 DIAGNOSIS — R51 Headache: Secondary | ICD-10-CM

## 2013-12-01 ENCOUNTER — Encounter: Payer: Self-pay | Admitting: Family Medicine

## 2013-12-01 DIAGNOSIS — R51 Headache: Secondary | ICD-10-CM

## 2013-12-01 DIAGNOSIS — R519 Headache, unspecified: Secondary | ICD-10-CM | POA: Insufficient documentation

## 2013-12-01 HISTORY — DX: Headache: R51

## 2013-12-01 NOTE — Assessment & Plan Note (Signed)
Encouraged increased rest and hydration, treat sinuses. Refer to neuro for further consideration since HA is persistent despite unremarkable CT scan

## 2013-12-01 NOTE — Assessment & Plan Note (Addendum)
Has just started Metoprolol, will increase Losartan and recheck pressure

## 2013-12-01 NOTE — Assessment & Plan Note (Signed)
Ct scan confirms mild sinusitis will treat with antibiotics, mucinex and probiotics

## 2013-12-02 ENCOUNTER — Other Ambulatory Visit: Payer: Self-pay

## 2013-12-02 MED ORDER — CEFDINIR 300 MG PO CAPS: 300.0000 mg | ORAL_CAPSULE | Freq: Two times a day (BID) | ORAL | Status: DC

## 2013-12-02 NOTE — Telephone Encounter (Signed)
RX sent per Dr Charlett Blake

## 2013-12-05 ENCOUNTER — Telehealth: Payer: Self-pay | Admitting: Family Medicine

## 2013-12-05 NOTE — Telephone Encounter (Signed)
Picked up 2 prescriptions from the pharmacy and they are confused about them  Please call and clarify

## 2013-12-05 NOTE — Telephone Encounter (Signed)
Informed patient's spouse.

## 2013-12-05 NOTE — Telephone Encounter (Signed)
Picked up 2 rx's today and are confused about them.  Please call

## 2013-12-05 NOTE — Progress Notes (Signed)
Patient's spouse informed.

## 2013-12-10 ENCOUNTER — Telehealth: Payer: Self-pay | Admitting: Family Medicine

## 2013-12-10 NOTE — Telephone Encounter (Signed)
Relevant patient education mailed to patient.  

## 2013-12-23 ENCOUNTER — Ambulatory Visit: Payer: Medicare HMO | Admitting: Family Medicine

## 2013-12-29 ENCOUNTER — Ambulatory Visit (INDEPENDENT_AMBULATORY_CARE_PROVIDER_SITE_OTHER): Payer: Medicare HMO | Admitting: Neurology

## 2013-12-29 ENCOUNTER — Encounter: Payer: Self-pay | Admitting: Neurology

## 2013-12-29 VITALS — BP 160/80 | HR 64 | Temp 97.8°F | Ht 68.9 in | Wt 184.3 lb

## 2013-12-29 DIAGNOSIS — R404 Transient alteration of awareness: Secondary | ICD-10-CM

## 2013-12-29 DIAGNOSIS — G609 Hereditary and idiopathic neuropathy, unspecified: Secondary | ICD-10-CM

## 2013-12-29 DIAGNOSIS — R51 Headache: Secondary | ICD-10-CM

## 2013-12-29 DIAGNOSIS — R519 Headache, unspecified: Secondary | ICD-10-CM

## 2013-12-29 LAB — C-REACTIVE PROTEIN

## 2013-12-29 LAB — SEDIMENTATION RATE: Sed Rate: 1 mm/hr (ref 0–16)

## 2013-12-29 NOTE — Patient Instructions (Addendum)
The headache may simply be a nagging headache.  One thing to consider is if it could be a side effect of the Protonix.  Another thing to consider, is to increase the metoprolol because this medication is used for headache as well.  I would leave that up to Dr. Charlett Blake to change.  We will check an EEG which measures the brainwaves.  At [facility]  01/08/14 9:15  am  [facility] [place] [facility]  We will check a blood test as well.  Follow up in 3 months but call in 4 to 6 weeks with update.

## 2013-12-29 NOTE — Progress Notes (Signed)
NEUROLOGY CONSULTATION NOTE  LOGON UTTECH MRN: [id number] DOB: 02-07-1928  Referring provider: Dr. Charlett Blake Primary care provider: Dr. Charlett Blake  Reason for consult:  Headache.  Transient altered sensorium.  HISTORY OF PRESENT ILLNESS: Troy Ray is an 80 year old right-handed man with history of CAD, hypertension, CKD stage II, hyperlipidemia, bilateral sensorineural hearing loss, AAA repair, who presents for headache.  He is accompanied by his wife.  Records and images were personally reviewed where available.    He began having headache in December or so.  He describes it as a nagging non-throbbing headache, bearable, located above the left eye and sometimes involving the back of the left side of his head.  There is no associated nausea, photophobia, phonophobia, osmophobia, visual disturbance, or neck pain.  It usually occurs when he first gets out of bed.  It can last 3 to 4 hours and occurs almost daily (about 5 days out of the week).  He takes an NSAID about 3 days out of the week, some weeks less or not at all.  At first, it was suspected that he had sinusitis so he was prescribed antibiotics.  However, the headache persisted.  He denies vision loss.  Around the time that the headache started, he says he started taking Protonix.  For the past 7 to 8 months, he has also had spells where he feels this strange flushing of his body.  It starts in the feet and ascends to the top of his head, lasting about 30 seconds.  Sometimes he feels palpitations.  It occurs only when he is still, such as laying down or sitting.  It does not occur often, maybe 10 times over the past 7 to 8 months.  It is not associated with the headaches.  He also has a longstanding history of pain (hot and cold sensation) in his feet, associated with tingling as well.  He has history of frost bite while in the [place], as well as pre-diabetes.  He has a remote history of TIA but no history of headaches or  seizures.  PAST MEDICAL HISTORY: Past Medical History  Diagnosis Date  . CAD (coronary artery disease)   . Hypertension   . Chronic kidney disease     chronic, stage II  . Hyperlipidemia   . PVD (peripheral vascular disease)   . Postural lightheadedness   . Memory loss   . Dysphagia   . Esophageal stricture     Dr [name]  . CRI (chronic renal insufficiency)   . Sensorineural hearing loss, bilateral   . Carotid stenosis, bilateral     right 60-79% stenosed.  Left 40-59%  . Adenomatous colon polyp   . Diverticulosis   . Unspecified constipation 12/29/2012  . BPH (benign prostatic hyperplasia) 07/14/2013  . Headache(784.0) 12/01/2013    PAST SURGICAL HISTORY: Past Surgical History  Procedure Laterality Date  . Hernia repair  03/23/05    left inguinal Lichtenstein repair, with mesh.  [name]  . Abdominal aortic aneurysm repair  1996    Dr [name]  . Renal artery stent  1996    Dr [name].  Bilateral stenting    MEDICATIONS: Current Outpatient Prescriptions on File Prior to Visit  Medication Sig Dispense Refill  . acetaminophen (TYLENOL) 500 MG tablet Take 1,000 mg by mouth every 6 (six) hours as needed.      [name] Kitchen albuterol (PROVENTIL HFA;VENTOLIN HFA) 108 (90 BASE) MCG/ACT inhaler Inhale 2 puffs into the lungs every 6 (six) hours  as needed for wheezing or shortness of breath. Or cough  1 Inhaler  0  . aspirin EC 81 MG EC tablet Take 1 tablet (81 mg total) by mouth daily.      . cefdinir (OMNICEF) 300 MG capsule Take 1 capsule (300 mg total) by mouth 2 (two) times daily. X 10 days  20 capsule  0  . finasteride (PROSCAR) 5 MG tablet Take 1 tablet (5 mg total) by mouth daily.  90 tablet  1  . guaiFENesin (MUCINEX) 600 MG 12 hr tablet Take 1,200 mg by mouth 2 (two) times daily.      [name] Kitchen losartan (COZAAR) 50 MG tablet Take 1 tablet (50 mg total) by mouth daily.  90 tablet  3  . metoprolol succinate (TOPROL-XL) 25 MG 24 hr tablet Take 1 tablet (25 mg total) by mouth daily.  90  tablet  3  . pantoprazole (PROTONIX) 40 MG tablet Take 1 tablet (40 mg total) by mouth daily.  90 tablet  3  . rosuvastatin (CRESTOR) 20 MG tablet Take 1 tablet (20 mg total) by mouth daily.  90 tablet  3   No current facility-administered medications on file prior to visit.    ALLERGIES: No Known Allergies  FAMILY HISTORY: Family History  Problem Relation Age of Onset  . Heart disease Father     CAD  . Cancer Neg Hx   . Diabetes Neg Hx   . Coronary artery disease Father     SOCIAL HISTORY: History   Social History  . Marital Status: Married    Spouse Name: N/A    Number of Children: 3  . Years of Education: N/A   Occupational History  . MEDIA ASSISTANT    Social History Main Topics  . Smoking status: Former Research scientist (life sciences)  . Smokeless tobacco: Never Used     Comment: quit 1980  . Alcohol Use: Yes  . Drug Use: Not on file  . Sexual Activity: Not on file   Other Topics Concern  . Not on file   Social History Narrative  . No narrative on file    REVIEW OF SYSTEMS: Constitutional: No fevers, chills, or sweats, no generalized fatigue, change in appetite Eyes: No visual changes, double vision, eye pain Ear, nose and throat: No hearing loss, ear pain, nasal congestion, sore throat Cardiovascular: No chest pain, palpitations Respiratory:  No shortness of breath at rest or with exertion, wheezes GastrointestinaI: No nausea, vomiting, diarrhea, abdominal pain, fecal incontinence Genitourinary:  No dysuria, urinary retention or frequency Musculoskeletal:  No neck pain, back pain Integumentary: No rash, pruritus, skin lesions Neurological: as above Psychiatric: No depression, insomnia, anxiety Endocrine: No palpitations, fatigue, diaphoresis, mood swings, change in appetite, change in weight, increased thirst Hematologic/Lymphatic:  No anemia, purpura, petechiae. Allergic/Immunologic: no itchy/runny eyes, nasal congestion, recent allergic reactions, rashes  PHYSICAL  EXAM: Filed Vitals:   12/29/13 1228  BP: 160/80  Pulse: 64  Temp: 97.8 F (36.6 C)   General: No acute distress Head:  Normocephalic/atraumatic Neck: supple, no paraspinal tenderness, full range of motion Back: No paraspinal tenderness Heart: regular rate and rhythm Lungs: Clear to auscultation bilaterally. Vascular: No carotid bruits. Neurological Exam: Mental status: alert and oriented to person, place, and time, speech fluent and not dysarthric, language intact. Cranial nerves: CN I: not tested CN II: pupils equal, round and reactive to light, visual fields intact, fundi unremarkable. CN III, IV, VI:  full range of motion, no nystagmus, no ptosis CN V: facial sensation intact  CN VII: upper and lower face symmetric CN VIII: hearing intact CN IX, X: gag intact, uvula midline CN XI: sternocleidomastoid and trapezius muscles intact CN XII: tongue midline Bulk & Tone: normal, no fasciculations. Motor: 5/5 throughout Sensation: reduced temperature sensation in the feet and fingers.  Reduced vibration in the toes.  Deep Tendon Reflexes: 1+ throughout, except absent in ankles.  Toes down. Finger to nose testing: no dysmetria Gait: mildly wide based gait, but normal stride.  Able to turn.  Unable to walk in tandem. Romberg negative.  IMPRESSION: 1.  Chronic headache.  May be tension-type or secondary to Protonix.  Does not seem like temporal arteritis but must be in differential. 2.  Transient altered sensorium.  Etiology unclear. 3.  Peripheral neuropathy.  Possibly related to frost bite versus pre-diabetes.  PLAN: 1.  Consider stopping the Protonix to see if headache resolves.  Another option is to increase the Toprol XL to 50 or $Rem'100mg'Kfja$  daily.  I defer to Dr. Charlett Blake regarding whether to either stop the Protonix or managing increasing the Toprol. 2.  EEG 3.  Check ESR and CRP. 4.  Follow up in 3 months.  45 minutes spent with patient, over 50% spent counseling and coordinating  care.  Thank you for allowing me to take part in the care of this patient.  Metta Clines, DO  CC:  Willette Alma, MD

## 2013-12-30 ENCOUNTER — Telehealth: Payer: Self-pay | Admitting: *Deleted

## 2013-12-30 NOTE — Telephone Encounter (Signed)
Patients spouse informed and is going to call and see which is cheaper at the pharmacy

## 2013-12-30 NOTE — Telephone Encounter (Signed)
No answer did not leave voicemail

## 2013-12-30 NOTE — Telephone Encounter (Signed)
Message copied by Varney Daily on Tue Dec 30, 2013  9:15 AM ------      Message from: Penni Homans A      Created: Mon Dec 29, 2013  9:45 PM       So let Mr Saiki switch his Protonix either back to Nexium or to Omeprazole 40 mg daily to see if his HA improves. Disp #30 with 1 rf then have him come in in 2-4 weeks to discuss his headaches more      ----- Message -----         From: Dudley Major, DO         Sent: 12/29/2013   1:31 PM           To: [name], MD            Erline Levine,      I saw Mr. Fontaine today.  Two options to consider:      1.  Try stopping the protonix.  This may cause headache and he and his wife says he started this medication around the time he began having headaches.  He would be interested in stopping it, if possible.            2.  Consider increasing the Toprol XL.  Doses at 100mg  daily may be effective for chronic headache.  If this is an option, you may do this.  I didn't want to make these changes myself in case there were other reasons that would be contraindicated.            Let me know if you have any other questions.            Adam       ------

## 2013-12-31 ENCOUNTER — Telehealth: Payer: Self-pay | Admitting: Family Medicine

## 2013-12-31 NOTE — Telephone Encounter (Signed)
Wants to know what milligram Dr Charlett Blake wants him to take.  Pharmacy cant tell them the cost difference between nexium and prilosec with the milligram

## 2013-12-31 NOTE — Telephone Encounter (Signed)
pts spouse informed

## 2013-12-31 NOTE — Telephone Encounter (Signed)
40 mg of either once daily

## 2013-12-31 NOTE — Telephone Encounter (Signed)
Please advise 

## 2014-01-06 ENCOUNTER — Other Ambulatory Visit: Payer: Self-pay

## 2014-01-06 MED ORDER — ESOMEPRAZOLE MAGNESIUM 40 MG PO CPDR: 40.0000 mg | DELAYED_RELEASE_CAPSULE | Freq: Every day | ORAL | Status: DC

## 2014-01-06 NOTE — Telephone Encounter (Signed)
Patients spouse called stating that she would like Nexium 40 mg called into the pharmacy

## 2014-01-07 ENCOUNTER — Other Ambulatory Visit: Payer: Self-pay | Admitting: Family Medicine

## 2014-01-08 ENCOUNTER — Ambulatory Visit (HOSPITAL_COMMUNITY): Payer: Medicare HMO

## 2014-01-09 ENCOUNTER — Ambulatory Visit: Payer: Medicare Other | Admitting: Family Medicine

## 2014-01-12 ENCOUNTER — Telehealth: Payer: Self-pay | Admitting: Family Medicine

## 2014-01-12 DIAGNOSIS — I10 Essential (primary) hypertension: Secondary | ICD-10-CM

## 2014-01-12 MED ORDER — METOPROLOL SUCCINATE ER 25 MG PO TB24: 25.0000 mg | ORAL_TABLET | Freq: Every day | ORAL | Status: DC

## 2014-01-12 NOTE — Telephone Encounter (Signed)
Patient wife called in stating that [facility] at [facility] never received metoprolol refill. She states that patient is now out of this medication. Please resend.

## 2014-01-22 ENCOUNTER — Ambulatory Visit (HOSPITAL_COMMUNITY): Payer: Medicare HMO

## 2014-01-23 ENCOUNTER — Ambulatory Visit (INDEPENDENT_AMBULATORY_CARE_PROVIDER_SITE_OTHER): Payer: Medicare HMO | Admitting: Family Medicine

## 2014-01-23 ENCOUNTER — Encounter: Payer: Self-pay | Admitting: Family Medicine

## 2014-01-23 VITALS — BP 170/82 | HR 65 | Temp 97.6°F | Ht 70.0 in | Wt 184.0 lb

## 2014-01-23 DIAGNOSIS — R7309 Other abnormal glucose: Secondary | ICD-10-CM

## 2014-01-23 DIAGNOSIS — R0681 Apnea, not elsewhere classified: Secondary | ICD-10-CM

## 2014-01-23 DIAGNOSIS — K219 Gastro-esophageal reflux disease without esophagitis: Secondary | ICD-10-CM

## 2014-01-23 DIAGNOSIS — I10 Essential (primary) hypertension: Secondary | ICD-10-CM

## 2014-01-23 DIAGNOSIS — R739 Hyperglycemia, unspecified: Secondary | ICD-10-CM

## 2014-01-23 DIAGNOSIS — R51 Headache: Secondary | ICD-10-CM

## 2014-01-23 DIAGNOSIS — E785 Hyperlipidemia, unspecified: Secondary | ICD-10-CM

## 2014-01-23 LAB — LIPID PANEL
CHOL/HDL RATIO: 3.7 ratio
CHOLESTEROL: 136 mg/dL (ref 0–200)
HDL: 37 mg/dL — ABNORMAL LOW (ref >39)
LDL Cholesterol: 64 mg/dL (ref 0–99)
Triglycerides: 174 mg/dL — ABNORMAL HIGH (ref <150)
VLDL: 35 mg/dL (ref 0–40)

## 2014-01-23 LAB — HEPATIC FUNCTION PANEL
ALT: 14 U/L (ref 0–53)
AST: 20 U/L (ref 0–37)
Albumin: 4.1 g/dL (ref 3.5–5.2)
Alkaline Phosphatase: 42 U/L (ref 39–117)
Bilirubin, Direct: 0.1 mg/dL (ref 0.0–0.3)
Indirect Bilirubin: 0.5 mg/dL (ref 0.2–1.2)
Total Bilirubin: 0.6 mg/dL (ref 0.2–1.2)
Total Protein: 7.1 g/dL (ref 6.0–8.3)

## 2014-01-23 LAB — CBC
HEMATOCRIT: 43.6 % (ref 39.0–52.0)
Hemoglobin: 14.6 g/dL (ref 13.0–17.0)
MCH: 29.4 pg (ref 26.0–34.0)
MCHC: 33.5 g/dL (ref 30.0–36.0)
MCV: 87.9 fL (ref 78.0–100.0)
PLATELETS: 181 10*3/uL (ref 150–400)
RBC: 4.96 MIL/uL (ref 4.22–5.81)
RDW: 14.6 % (ref 11.5–15.5)
WBC: 10 10*3/uL (ref 4.0–10.5)

## 2014-01-23 LAB — RENAL FUNCTION PANEL
Albumin: 4.1 g/dL (ref 3.5–5.2)
BUN: 20 mg/dL (ref 6–23)
CHLORIDE: 104 meq/L (ref 96–112)
CO2: 25 meq/L (ref 19–32)
Calcium: 8.9 mg/dL (ref 8.4–10.5)
Creat: 1.27 mg/dL (ref 0.50–1.35)
Glucose, Bld: 119 mg/dL — ABNORMAL HIGH (ref 70–99)
PHOSPHORUS: 2.9 mg/dL (ref 2.3–4.6)
POTASSIUM: 4.6 meq/L (ref 3.5–5.3)
Sodium: 138 mEq/L (ref 135–145)

## 2014-01-23 LAB — HEMOGLOBIN A1C
Hgb A1c MFr Bld: 6.1 % — ABNORMAL HIGH (ref <5.7)
Mean Plasma Glucose: 128 mg/dL — ABNORMAL HIGH (ref <117)

## 2014-01-23 MED ORDER — METOPROLOL SUCCINATE ER 25 MG PO TB24: 25.0000 mg | ORAL_TABLET | Freq: Every day | ORAL | Status: DC

## 2014-01-23 MED ORDER — PANTOPRAZOLE SODIUM 40 MG PO TBEC: 40.0000 mg | DELAYED_RELEASE_TABLET | Freq: Every day | ORAL | Status: DC

## 2014-01-23 MED ORDER — LOSARTAN POTASSIUM 50 MG PO TABS: 50.0000 mg | ORAL_TABLET | Freq: Every day | ORAL | Status: DC

## 2014-01-23 NOTE — Patient Instructions (Signed)

## 2014-01-23 NOTE — Assessment & Plan Note (Signed)
Worse in am

## 2014-01-23 NOTE — Progress Notes (Signed)
Pre visit review using our clinic review tool, if applicable. No additional management support is needed unless otherwise documented below in the visit note. 

## 2014-01-23 NOTE — Progress Notes (Signed)
Patient ID: Troy Ray, male   DOB: 1928-06-24, 80 y.o.   MRN: [id number] BURMAN [facility] [id number] 09-10-1928 01/23/2014      Progress Note-Follow Up  Subjective  Chief Complaint  Chief Complaint  Patient presents with  . Follow-up    3 month    HPI  Patient is a 80 year old male in today for routine medical care. He is in today with his wife who noticed that he stops breathing at night at times. He snores excessively. He wakes up fatigued with frequent and headaches. Eyes no recent illness. Did not increase his lovastatin as instructed at the last visit. Denies CP/palp/SOB/HA/congestion/fevers/GI or GU c/o. Taking meds as prescribed  Past Medical History  Diagnosis Date  . CAD (coronary artery disease)   . Hypertension   . Chronic kidney disease     chronic, stage II  . Hyperlipidemia   . PVD (peripheral vascular disease)   . Postural lightheadedness   . Memory loss   . Dysphagia   . Esophageal stricture     Dr [name]  . CRI (chronic renal insufficiency)   . Sensorineural hearing loss, bilateral   . Carotid stenosis, bilateral     right 60-79% stenosed.  Left 40-59%  . Adenomatous colon polyp   . Diverticulosis   . Unspecified constipation 12/29/2012  . BPH (benign prostatic hyperplasia) 07/14/2013  . TMLYYTKP(546.5) 12/01/2013    Past Surgical History  Procedure Laterality Date  . Hernia repair  03/23/05    left inguinal Lichtenstein repair, with mesh.  [name]  . Abdominal aortic aneurysm repair  1996    Dr [name]  . Renal artery stent  1996    Dr [name].  Bilateral stenting    Family History  Problem Relation Age of Onset  . Heart disease Father     CAD  . Cancer Neg Hx   . Diabetes Neg Hx   . Coronary artery disease Father     History   Social History  . Marital Status: Married    Spouse Name: N/A    Number of Children: 3  . Years of Education: N/A   Occupational History  . MEDIA ASSISTANT    Social History Main Topics  . Smoking  status: Former Research scientist (life sciences)  . Smokeless tobacco: Never Used     Comment: quit 1980  . Alcohol Use: Yes  . Drug Use: Not on file  . Sexual Activity: Not on file   Other Topics Concern  . Not on file   Social History Narrative  . No narrative on file    Current Outpatient Prescriptions on File Prior to Visit  Medication Sig Dispense Refill  . aspirin EC 81 MG EC tablet Take 1 tablet (81 mg total) by mouth daily.      . finasteride (PROSCAR) 5 MG tablet Take 1 tablet (5 mg total) by mouth daily.  90 tablet  1  . losartan (COZAAR) 50 MG tablet Take 1 tablet (50 mg total) by mouth daily.  90 tablet  3  . metoprolol succinate (TOPROL-XL) 25 MG 24 hr tablet Take 1 tablet (25 mg total) by mouth daily.  90 tablet  3  . rosuvastatin (CRESTOR) 20 MG tablet Take 1 tablet (20 mg total) by mouth daily.  90 tablet  3  . esomeprazole (NEXIUM) 40 MG capsule Take 1 capsule (40 mg total) by mouth daily.  30 capsule  1   No current facility-administered medications on file prior to visit.  No Known Allergies  Review of Systems  Review of Systems  Constitutional: Negative for fever and malaise/fatigue.  HENT: Negative for congestion.   Eyes: Negative for discharge.  Respiratory: Negative for shortness of breath.   Cardiovascular: Negative for chest pain, palpitations and leg swelling.  Gastrointestinal: Negative for nausea, abdominal pain and diarrhea.  Genitourinary: Negative for dysuria.  Musculoskeletal: Negative for falls.  Skin: Negative for rash.  Neurological: Negative for loss of consciousness and headaches.  Endo/Heme/Allergies: Negative for polydipsia.  Psychiatric/Behavioral: Negative for depression and suicidal ideas. The patient is not nervous/anxious and does not have insomnia.     Objective  BP 170/82  Pulse 65  Temp(Src) 97.6 F (36.4 C) (Oral)  Ht 5\' 10"  (1.778 m)  Wt 184 lb 0.6 oz (83.48 kg)  BMI 26.41 kg/m2  SpO2 98%  Physical Exam  Physical Exam  Constitutional:  He is oriented to person, place, and time and well-developed, well-nourished, and in no distress. No distress.  HENT:  Head: Normocephalic and atraumatic.  Eyes: Conjunctivae are normal.  Neck: Neck supple. No thyromegaly present.  Cardiovascular: Normal rate, regular rhythm and normal heart sounds.   No murmur heard. Pulmonary/Chest: Effort normal and breath sounds normal. No respiratory distress.  Abdominal: He exhibits no distension and no mass. There is no tenderness.  Musculoskeletal: He exhibits no edema.  Neurological: He is alert and oriented to person, place, and time.  Skin: Skin is warm.  Psychiatric: Memory, affect and judgment normal.    Lab Results  Component Value Date   TSH 2.751 10/06/2013   Lab Results  Component Value Date   WBC 10.7* 10/06/2013   HGB 14.5 10/06/2013   HCT 43.2 10/06/2013   MCV 89.6 10/06/2013   PLT 176 10/06/2013   Lab Results  Component Value Date   CREATININE 1.28 10/06/2013   BUN 16 10/06/2013   NA 137 10/06/2013   K 4.5 10/06/2013   CL 105 10/06/2013   CO2 25 10/06/2013   Lab Results  Component Value Date   ALT 16 10/06/2013   AST 21 10/06/2013   ALKPHOS 42 10/06/2013   BILITOT 0.7 10/06/2013   Lab Results  Component Value Date   CHOL 135 10/06/2013   Lab Results  Component Value Date   HDL 42 10/06/2013   Lab Results  Component Value Date   LDLCALC 69 10/06/2013   Lab Results  Component Value Date   TRIG 118 10/06/2013   Lab Results  Component Value Date   CHOLHDL 3.2 10/06/2013     Assessment & Plan  Headache(784.0) Worse in am  HYPERTENSION Never started on Losartan 50 mg will start today  HYPERLIPIDEMIA Tolerating statin, encouraged heart healthy diet, avoid trans fats, minimize simple carbs and saturated fats. Increase exercise as tolerated  Apnea Wife witnesses apnea, has excessive fatigue, difficult to control BP, and am HA. Sleep study is ordered.

## 2014-01-24 LAB — TSH: TSH: 3.321 u[IU]/mL (ref 0.350–4.500)

## 2014-01-25 DIAGNOSIS — R0681 Apnea, not elsewhere classified: Secondary | ICD-10-CM | POA: Insufficient documentation

## 2014-01-25 NOTE — Assessment & Plan Note (Signed)
Wife witnesses apnea, has excessive fatigue, difficult to control BP, and am HA. Sleep study is ordered.

## 2014-01-25 NOTE — Assessment & Plan Note (Signed)
Tolerating statin, encouraged heart healthy diet, avoid trans fats, minimize simple carbs and saturated fats. Increase exercise as tolerated 

## 2014-01-25 NOTE — Assessment & Plan Note (Signed)
Never started on Losartan 50 mg will start today

## 2014-01-27 ENCOUNTER — Other Ambulatory Visit: Payer: Self-pay

## 2014-01-27 DIAGNOSIS — N400 Enlarged prostate without lower urinary tract symptoms: Secondary | ICD-10-CM

## 2014-01-27 DIAGNOSIS — E785 Hyperlipidemia, unspecified: Secondary | ICD-10-CM

## 2014-01-27 MED ORDER — FINASTERIDE 5 MG PO TABS: 5.0000 mg | ORAL_TABLET | Freq: Every day | ORAL | Status: DC

## 2014-01-27 MED ORDER — ROSUVASTATIN CALCIUM 20 MG PO TABS: 20.0000 mg | ORAL_TABLET | Freq: Every day | ORAL | Status: DC

## 2014-01-27 NOTE — Telephone Encounter (Signed)
pts spouse left a message stating that pharmacy needs an rx for finasteride and crestor. They should have plenty of both?

## 2014-02-20 ENCOUNTER — Encounter: Payer: Self-pay | Admitting: Family Medicine

## 2014-02-20 ENCOUNTER — Ambulatory Visit (INDEPENDENT_AMBULATORY_CARE_PROVIDER_SITE_OTHER): Payer: Commercial Managed Care - HMO | Admitting: Family Medicine

## 2014-02-20 VITALS — BP 152/72 | HR 58 | Temp 98.5°F | Ht 70.0 in | Wt 184.0 lb

## 2014-02-20 DIAGNOSIS — E785 Hyperlipidemia, unspecified: Secondary | ICD-10-CM

## 2014-02-20 DIAGNOSIS — I10 Essential (primary) hypertension: Secondary | ICD-10-CM

## 2014-02-20 DIAGNOSIS — R7309 Other abnormal glucose: Secondary | ICD-10-CM

## 2014-02-20 DIAGNOSIS — K5900 Constipation, unspecified: Secondary | ICD-10-CM

## 2014-02-20 NOTE — Patient Instructions (Signed)
DASH Diet  The DASH diet stands for "Dietary Approaches to Stop Hypertension." It is a healthy eating plan that has been shown to reduce high blood pressure (hypertension) in as little as 14 days, while also possibly providing other significant health benefits. These other health benefits include reducing the risk of breast cancer after menopause and reducing the risk of type 2 diabetes, heart disease, colon cancer, and stroke. Health benefits also include weight loss and slowing kidney failure in patients with chronic kidney disease.   DIET GUIDELINES  · Limit salt (sodium). Your diet should contain less than 1500 mg of sodium daily.  · Limit refined or processed carbohydrates. Your diet should include mostly whole grains. Desserts and added sugars should be used sparingly.  · Include small amounts of heart-healthy fats. These types of fats include nuts, oils, and tub margarine. Limit saturated and trans fats. These fats have been shown to be harmful in the body.  CHOOSING FOODS   The following food groups are based on a 2000 calorie diet. See your Registered Dietitian for individual calorie needs.  Grains and Grain Products (6 to 8 servings daily)  · Eat More Often: Whole-wheat bread, brown rice, whole-grain or wheat pasta, quinoa, popcorn without added fat or salt (air popped).  · Eat Less Often: White bread, white pasta, white rice, cornbread.  Vegetables (4 to 5 servings daily)  · Eat More Often: Fresh, frozen, and canned vegetables. Vegetables may be raw, steamed, roasted, or grilled with a minimal amount of fat.  · Eat Less Often/Avoid: Creamed or fried vegetables. Vegetables in a cheese sauce.  Fruit (4 to 5 servings daily)  · Eat More Often: All fresh, canned (in natural juice), or frozen fruits. Dried fruits without added sugar. One hundred percent fruit juice (½ cup [237 mL] daily).  · Eat Less Often: Dried fruits with added sugar. Canned fruit in light or heavy syrup.  Lean Meats, Fish, and Poultry (2  servings or less daily. One serving is 3 to 4 oz [85-114 g]).  · Eat More Often: Ninety percent or leaner ground beef, tenderloin, sirloin. Round cuts of beef, chicken breast, turkey breast. All fish. Grill, bake, or broil your meat. Nothing should be fried.  · Eat Less Often/Avoid: Fatty cuts of meat, turkey, or chicken leg, thigh, or wing. Fried cuts of meat or fish.  Dairy (2 to 3 servings)  · Eat More Often: Low-fat or fat-free milk, low-fat plain or light yogurt, reduced-fat or part-skim cheese.  · Eat Less Often/Avoid: Milk (whole, 2%). Whole milk yogurt. Full-fat cheeses.  Nuts, Seeds, and Legumes (4 to 5 servings per week)  · Eat More Often: All without added salt.  · Eat Less Often/Avoid: Salted nuts and seeds, canned beans with added salt.  Fats and Sweets (limited)  · Eat More Often: Vegetable oils, tub margarines without trans fats, sugar-free gelatin. Mayonnaise and salad dressings.  · Eat Less Often/Avoid: Coconut oils, palm oils, butter, stick margarine, cream, half and half, cookies, candy, pie.  FOR MORE INFORMATION  The Dash Diet Eating Plan: www.dashdiet.org  Document Released: 10/12/2011 Document Revised: 01/15/2012 Document Reviewed: 10/12/2011  [employer]® Patient Information ©2014 [employer].

## 2014-02-20 NOTE — Progress Notes (Signed)
Pre visit review using our clinic review tool, if applicable. No additional management support is needed unless otherwise documented below in the visit note. 

## 2014-02-22 ENCOUNTER — Encounter: Payer: Self-pay | Admitting: Family Medicine

## 2014-02-22 NOTE — Assessment & Plan Note (Signed)
Encouraged increased hydration and fiber in diet. Daily probiotics. If bowels not moving can use MOM 2 tbls po in 4 oz of warm prune juice by mouth every 2-3 days. If no results then repeat in 4 hours with  Dulcolax suppository pr, may repeat again in 4 more hours as needed. Seek care if symptoms worsen. Consider daily Miralax and/or Dulcolax if symptoms persist.  

## 2014-02-22 NOTE — Assessment & Plan Note (Signed)
Tolerating statin, encouraged heart healthy diet, avoid trans fats, minimize simple carbs and saturated fats. Increase exercise as tolerated 

## 2014-02-22 NOTE — Assessment & Plan Note (Signed)
hgba1c acceptable, minimize simple carbs. Increase exercise as tolerated.  

## 2014-02-22 NOTE — Assessment & Plan Note (Signed)
Denies CP/palp/SOB/HA/congestion/fevers/GI or GU c/o. Taking meds as prescribed 

## 2014-02-22 NOTE — Progress Notes (Signed)
Patient ID: Troy Ray, male   DOB: [birth year], 80 y.o.   MRN: [id number] Troy Ray [id number] August 04, 1928 02/22/2014      Progress Note-Follow Up  Subjective  Chief Complaint  Chief Complaint  Patient presents with  . Follow-up    4 week    HPI  /\Patient is a 80 year old male in today for routine medical care. No recent illness. In today with his wife. He feels well today. Denies CP/palp/SOB/HA/congestion/fevers/GI or GU c/o. Taking meds as prescribed. Headaches are much better.  Past Medical History  Diagnosis Date  . CAD (coronary artery disease)   . Hypertension   . Chronic kidney disease     chronic, stage II  . Hyperlipidemia   . PVD (peripheral vascular disease)   . Postural lightheadedness   . Memory loss   . Dysphagia   . Esophageal stricture     Dr [name]  . CRI (chronic renal insufficiency)   . Sensorineural hearing loss, bilateral   . Carotid stenosis, bilateral     right 60-79% stenosed.  Left 40-59%  . Adenomatous colon polyp   . Diverticulosis   . Unspecified constipation 12/29/2012  . BPH (benign prostatic hyperplasia) 07/14/2013  . YIRSWNIO(270.3) 12/01/2013    Past Surgical History  Procedure Laterality Date  . Hernia repair  03/23/05    left inguinal Lichtenstein repair, with mesh.  [name]  . Abdominal aortic aneurysm repair  1996    Dr [name]  . Renal artery stent  1996    Dr [name].  Bilateral stenting    Family History  Problem Relation Age of Onset  . Heart disease Father     CAD  . Cancer Neg Hx   . Diabetes Neg Hx   . Coronary artery disease Father     History   Social History  . Marital Status: Married    Spouse Name: N/A    Number of Children: 3  . Years of Education: N/A   Occupational History  . MEDIA ASSISTANT    Social History Main Topics  . Smoking status: Former Research scientist (life sciences)  . Smokeless tobacco: Never Used     Comment: quit 1980  . Alcohol Use: Yes  . Drug Use: Not on file  . Sexual Activity: Not on  file   Other Topics Concern  . Not on file   Social History Narrative  . No narrative on file    Current Outpatient Prescriptions on File Prior to Visit  Medication Sig Dispense Refill  . aspirin EC 81 MG EC tablet Take 1 tablet (81 mg total) by mouth daily.      . finasteride (PROSCAR) 5 MG tablet Take 1 tablet (5 mg total) by mouth daily.  90 tablet  1  . losartan (COZAAR) 50 MG tablet Take 1 tablet (50 mg total) by mouth daily.  90 tablet  3  . metoprolol succinate (TOPROL-XL) 25 MG 24 hr tablet Take 1 tablet (25 mg total) by mouth daily.  90 tablet  3  . pantoprazole (PROTONIX) 40 MG tablet Take 1 tablet (40 mg total) by mouth daily.  90 tablet  3  . rosuvastatin (CRESTOR) 20 MG tablet Take 1 tablet (20 mg total) by mouth daily.  90 tablet  1   No current facility-administered medications on file prior to visit.    No Known Allergies  Review of Systems  Review of Systems  Constitutional: Negative for fever and malaise/fatigue.  HENT: Negative for congestion.  Eyes: Negative for discharge.  Respiratory: Negative for shortness of breath.   Cardiovascular: Negative for chest pain, palpitations and leg swelling.  Gastrointestinal: Negative for nausea, abdominal pain and diarrhea.  Genitourinary: Negative for dysuria.  Musculoskeletal: Negative for falls.  Skin: Negative for rash.  Neurological: Negative for loss of consciousness and headaches.  Endo/Heme/Allergies: Negative for polydipsia.  Psychiatric/Behavioral: Negative for depression and suicidal ideas. The patient is not nervous/anxious and does not have insomnia.     Objective  BP 152/72  Pulse 58  Temp(Src) 98.5 F (36.9 C) (Oral)  Ht 5\' 10"  (1.778 m)  Wt 184 lb (83.462 kg)  BMI 26.40 kg/m2  SpO2 98%  Physical Exam  Physical Exam  Constitutional: He is oriented to person, place, and time and well-developed, well-nourished, and in no distress. No distress.  HENT:  Head: Normocephalic and atraumatic.   Eyes: Conjunctivae are normal.  Neck: Neck supple. No thyromegaly present.  Cardiovascular: Normal rate, regular rhythm and normal heart sounds.   No murmur heard. Pulmonary/Chest: Effort normal and breath sounds normal. No respiratory distress.  Abdominal: He exhibits no distension and no mass. There is no tenderness.  Musculoskeletal: He exhibits no edema.  Neurological: He is alert and oriented to person, place, and time.  Skin: Skin is warm.  Psychiatric: Memory, affect and judgment normal.    Lab Results  Component Value Date   TSH 3.321 01/23/2014   Lab Results  Component Value Date   WBC 10.0 01/23/2014   HGB 14.6 01/23/2014   HCT 43.6 01/23/2014   MCV 87.9 01/23/2014   PLT 181 01/23/2014   Lab Results  Component Value Date   CREATININE 1.27 01/23/2014   BUN 20 01/23/2014   NA 138 01/23/2014   K 4.6 01/23/2014   CL 104 01/23/2014   CO2 25 01/23/2014   Lab Results  Component Value Date   ALT 14 01/23/2014   AST 20 01/23/2014   ALKPHOS 42 01/23/2014   BILITOT 0.6 01/23/2014   Lab Results  Component Value Date   CHOL 136 01/23/2014   Lab Results  Component Value Date   HDL 37* 01/23/2014   Lab Results  Component Value Date   LDLCALC 64 01/23/2014   Lab Results  Component Value Date   TRIG 174* 01/23/2014   Lab Results  Component Value Date   CHOLHDL 3.7 01/23/2014     Assessment & Plan  HYPERTENSION Denies CP/palp/SOB/HA/congestion/fevers/GI or GU c/o. Taking meds as prescribed  HYPERLIPIDEMIA Tolerating statin, encouraged heart healthy diet, avoid trans fats, minimize simple carbs and saturated fats. Increase exercise as tolerated  Unspecified constipation Encouraged increased hydration and fiber in diet. Daily probiotics. If bowels not moving can use MOM 2 tbls po in 4 oz of warm prune juice by mouth every 2-3 days. If no results then repeat in 4 hours with  Dulcolax suppository pr, may repeat again in 4 more hours as needed. Seek care if symptoms worsen.  Consider daily Miralax and/or Dulcolax if symptoms persist.   HYPERGLYCEMIA hgba1c acceptable, minimize simple carbs. Increase exercise as tolerated.

## 2014-03-31 ENCOUNTER — Ambulatory Visit: Payer: Medicare HMO | Admitting: Neurology

## 2014-04-03 ENCOUNTER — Ambulatory Visit (INDEPENDENT_AMBULATORY_CARE_PROVIDER_SITE_OTHER): Payer: Commercial Managed Care - HMO | Admitting: Physician Assistant

## 2014-04-03 ENCOUNTER — Encounter: Payer: Self-pay | Admitting: Physician Assistant

## 2014-04-03 VITALS — BP 148/78 | HR 53 | Temp 97.9°F | Resp 18 | Ht 70.0 in | Wt 183.2 lb

## 2014-04-03 DIAGNOSIS — M5441 Lumbago with sciatica, right side: Secondary | ICD-10-CM

## 2014-04-03 DIAGNOSIS — M5442 Lumbago with sciatica, left side: Secondary | ICD-10-CM | POA: Insufficient documentation

## 2014-04-03 DIAGNOSIS — M545 Low back pain, unspecified: Secondary | ICD-10-CM

## 2014-04-03 DIAGNOSIS — M5430 Sciatica, unspecified side: Secondary | ICD-10-CM

## 2014-04-03 HISTORY — DX: Low back pain, unspecified: M54.50

## 2014-04-03 MED ORDER — METHYLPREDNISOLONE (PAK) 4 MG PO TABS: ORAL_TABLET | ORAL | Status: DC

## 2014-04-03 NOTE — Progress Notes (Signed)
Patient presents to clinic today c/o 4 days of right-sided low back pain with radiation dow his RLE.  Patient denies trauma or injury.  Denies numbness, tingling or weakness of RLE.  Denies saddle anesthesia or change to urinary/bowel habits.   Past Medical History  Diagnosis Date  . CAD (coronary artery disease)   . Hypertension   . Chronic kidney disease     chronic, stage II  . Hyperlipidemia   . PVD (peripheral vascular disease)   . Postural lightheadedness   . Memory loss   . Dysphagia   . Esophageal stricture     Dr [name]  . CRI (chronic renal insufficiency)   . Sensorineural hearing loss, bilateral   . Carotid stenosis, bilateral     right 60-79% stenosed.  Left 40-59%  . Adenomatous colon polyp   . Diverticulosis   . Unspecified constipation 12/29/2012  . BPH (benign prostatic hyperplasia) 07/14/2013  . Headache(784.0) 12/01/2013    Current Outpatient Prescriptions on File Prior to Visit  Medication Sig Dispense Refill  . aspirin EC 81 MG EC tablet Take 1 tablet (81 mg total) by mouth daily.      . finasteride (PROSCAR) 5 MG tablet Take 1 tablet (5 mg total) by mouth daily.  90 tablet  1  . losartan (COZAAR) 50 MG tablet Take 1 tablet (50 mg total) by mouth daily.  90 tablet  3  . metoprolol succinate (TOPROL-XL) 25 MG 24 hr tablet Take 1 tablet (25 mg total) by mouth daily.  90 tablet  3  . pantoprazole (PROTONIX) 40 MG tablet Take 1 tablet (40 mg total) by mouth daily.  90 tablet  3  . rosuvastatin (CRESTOR) 20 MG tablet Take 1 tablet (20 mg total) by mouth daily.  90 tablet  1   No current facility-administered medications on file prior to visit.    No Known Allergies  Family History  Problem Relation Age of Onset  . Heart disease Father     CAD  . Cancer Neg Hx   . Diabetes Neg Hx   . Coronary artery disease Father     History   Social History  . Marital Status: Married    Spouse Name: N/A    Number of Children: 3  . Years of Education: N/A    Occupational History  . MEDIA ASSISTANT    Social History Main Topics  . Smoking status: Former Research scientist (life sciences)  . Smokeless tobacco: Never Used     Comment: quit 1980  . Alcohol Use: Yes  . Drug Use: None  . Sexual Activity: None   Other Topics Concern  . None   Social History Narrative  . None   Review of Systems - See HPI.  All other ROS are negative.  BP 148/78  Pulse 53  Temp(Src) 97.9 F (36.6 C) (Oral)  Resp 18  Ht 5\' 10"  (1.778 m)  Wt 183 lb 4 oz (83.122 kg)  BMI 26.29 kg/m2  SpO2 98%  Physical Exam  Vitals reviewed. Constitutional: He is oriented to person, place, and time and well-developed, well-nourished, and in no distress.  HENT:  Head: Normocephalic and atraumatic.  Cardiovascular: Normal rate, regular rhythm, normal heart sounds and intact distal pulses.   Pulmonary/Chest: Effort normal and breath sounds normal. No respiratory distress. He has no wheezes. He has no rales. He exhibits no tenderness.  Musculoskeletal:       Right hip: Normal.       Right knee: Normal.  Right ankle: Normal.       Lumbar back: He exhibits tenderness and pain. He exhibits normal range of motion, no bony tenderness and no spasm.  Negative for tenderness.  Negative for swelling.   Neurological: He is alert and oriented to person, place, and time. Gait normal.  Skin: Skin is warm and dry. No rash noted.  Psychiatric: Affect normal.    Recent Results (from the past 2160 hour(s))  CBC     Status: None   Collection Time    01/23/14  2:17 PM      Result Value Ref Range   WBC 10.0  4.0 - 10.5 K/uL   RBC 4.96  4.22 - 5.81 MIL/uL   Hemoglobin 14.6  13.0 - 17.0 g/dL   HCT 43.6  39.0 - 52.0 %   MCV 87.9  78.0 - 100.0 fL   MCH 29.4  26.0 - 34.0 pg   MCHC 33.5  30.0 - 36.0 g/dL   RDW 14.6  11.5 - 15.5 %   Platelets 181  150 - 400 K/uL  RENAL FUNCTION PANEL     Status: Abnormal   Collection Time    01/23/14  2:17 PM      Result Value Ref Range   Sodium 138  135 - 145 mEq/L    Potassium 4.6  3.5 - 5.3 mEq/L   Chloride 104  96 - 112 mEq/L   CO2 25  19 - 32 mEq/L   Glucose, Bld 119 (*) 70 - 99 mg/dL   BUN 20  6 - 23 mg/dL   Creat 1.27  0.50 - 1.35 mg/dL   Albumin 4.1  3.5 - 5.2 g/dL   Calcium 8.9  8.4 - 10.5 mg/dL   Phosphorus 2.9  2.3 - 4.6 mg/dL  TSH     Status: None   Collection Time    01/23/14  2:17 PM      Result Value Ref Range   TSH 3.321  0.350 - 4.500 uIU/mL  HEMOGLOBIN A1C     Status: Abnormal   Collection Time    01/23/14  2:17 PM      Result Value Ref Range   Hemoglobin A1C 6.1 (*) <5.7 %   Comment:                                                                            According to the ADA Clinical Practice Recommendations for 2011, when     HbA1c is used as a screening test:             >=6.5%   Diagnostic of Diabetes Mellitus                (if abnormal result is confirmed)           5.7-6.4%   Increased risk of developing Diabetes Mellitus           References:Diagnosis and Classification of Diabetes Mellitus,Diabetes     JHER,7408,14(GYJEH 1):S62-S69 and Standards of Medical Care in             Diabetes - 2011,Diabetes Care,2011,34 (Suppl 1):S11-S61.         Mean Plasma Glucose 128 (*) <117 mg/dL  LIPID  PANEL     Status: Abnormal   Collection Time    01/23/14  2:17 PM      Result Value Ref Range   Cholesterol 136  0 - 200 mg/dL   Comment: ATP III Classification:           < 200        mg/dL        Desirable          200 - 239     mg/dL        Borderline High          >= 240        mg/dL        High         Triglycerides 174 (*) <150 mg/dL   HDL 37 (*) >39 mg/dL   Total CHOL/HDL Ratio 3.7     VLDL 35  0 - 40 mg/dL   LDL Cholesterol 64  0 - 99 mg/dL   Comment:       Total Cholesterol/HDL Ratio:CHD Risk                            Coronary Heart Disease Risk Table                                            Men       Women              1/2 Average Risk              3.4        3.3                  Average Risk               5.0        4.4               2X Average Risk              9.6        7.1               3X Average Risk             23.4       11.0     Use the calculated Patient Ratio above and the CHD Risk table      to determine the patient's CHD Risk.     ATP III Classification (LDL):           < 100        mg/dL         Optimal          100 - 129     mg/dL         Near or Above Optimal          130 - 159     mg/dL         Borderline High          160 - 189     mg/dL         High           > 190        mg/dL         Very High  HEPATIC FUNCTION PANEL     Status: None   Collection Time    01/23/14  2:17 PM      Result Value Ref Range   Total Bilirubin 0.6  0.2 - 1.2 mg/dL   Bilirubin, Direct 0.1  0.0 - 0.3 mg/dL   Indirect Bilirubin 0.5  0.2 - 1.2 mg/dL   Alkaline Phosphatase 42  39 - 117 U/L   AST 20  0 - 37 U/L   ALT 14  0 - 53 U/L   Total Protein 7.1  6.0 - 8.3 g/dL   Albumin 4.1  3.5 - 5.2 g/dL   Assessment/Plan: Low back pain with right-sided sciatica < 1 week of symptoms. Rx Medrol dose pack.  Tylenol for pain.  Topical NSAIDs.  Heat.  Avoid heavy lifting or overexertion.  Call or RTC if symptoms are not improving.  Will need imaging and PT.

## 2014-04-03 NOTE — Patient Instructions (Addendum)
Please take Medrol dose pack as directed.  Take tylenol for pain.  Apply heating pad to lower back for 15 minutes, a few times per day.  Apply a topical Aspercreme or Icy Hot.  Avoid heavy lifting or overexertion.  Call or return to clinic if symptoms are not improving.  We may have to set you up with Physical Therapy

## 2014-04-03 NOTE — Assessment & Plan Note (Signed)
<   1 week of symptoms. Rx Medrol dose pack.  Tylenol for pain.  Topical NSAIDs.  Heat.  Avoid heavy lifting or overexertion.  Call or RTC if symptoms are not improving.  Will need imaging and PT.

## 2014-04-03 NOTE — Progress Notes (Signed)
Pre visit review using our clinic review tool, if applicable. No additional management support is needed unless otherwise documented below in the visit note/SLS  

## 2014-04-16 ENCOUNTER — Other Ambulatory Visit: Payer: Self-pay | Admitting: Family Medicine

## 2014-04-23 ENCOUNTER — Ambulatory Visit (HOSPITAL_BASED_OUTPATIENT_CLINIC_OR_DEPARTMENT_OTHER)
Admission: RE | Admit: 2014-04-23 | Discharge: 2014-04-23 | Disposition: A | Discharge status: Home / Self Care | Payer: Medicare HMO | Source: Ambulatory Visit | Attending: Family Medicine | Admitting: Family Medicine

## 2014-04-23 ENCOUNTER — Encounter: Payer: Self-pay | Admitting: Family Medicine

## 2014-04-23 ENCOUNTER — Ambulatory Visit (INDEPENDENT_AMBULATORY_CARE_PROVIDER_SITE_OTHER): Payer: Commercial Managed Care - HMO | Admitting: Family Medicine

## 2014-04-23 VITALS — BP 130/72 | HR 71 | Temp 98.4°F | Ht 70.0 in | Wt 179.1 lb

## 2014-04-23 DIAGNOSIS — M5441 Lumbago with sciatica, right side: Secondary | ICD-10-CM

## 2014-04-23 DIAGNOSIS — E785 Hyperlipidemia, unspecified: Secondary | ICD-10-CM

## 2014-04-23 DIAGNOSIS — I10 Essential (primary) hypertension: Secondary | ICD-10-CM

## 2014-04-23 DIAGNOSIS — M545 Low back pain, unspecified: Secondary | ICD-10-CM | POA: Insufficient documentation

## 2014-04-23 DIAGNOSIS — M25559 Pain in unspecified hip: Secondary | ICD-10-CM | POA: Insufficient documentation

## 2014-04-23 DIAGNOSIS — M5430 Sciatica, unspecified side: Secondary | ICD-10-CM

## 2014-04-23 MED ORDER — DIAZEPAM 2 MG PO TABS: 2.0000 mg | ORAL_TABLET | Freq: Four times a day (QID) | ORAL | Status: DC | PRN

## 2014-04-23 NOTE — Patient Instructions (Addendum)
Salon Pas patches twice daily to hip for roughly next week   Back Pain, Adult Low back pain is very common. About 1 in 5 people have back pain.The cause of low back pain is rarely dangerous. The pain often gets better over time.About half of people with a sudden onset of back pain feel better in just 2 weeks. About 8 in 10 people feel better by 6 weeks.  CAUSES Some common causes of back pain include:  Strain of the muscles or ligaments supporting the spine.  Wear and tear (degeneration) of the spinal discs.  Arthritis.  Direct injury to the back. DIAGNOSIS Most of the time, the direct cause of low back pain is not known.However, back pain can be treated effectively even when the exact cause of the pain is unknown.Answering your caregiver's questions about your overall health and symptoms is one of the most accurate ways to make sure the cause of your pain is not dangerous. If your caregiver needs more information, he or she may order lab work or imaging tests (X-rays or MRIs).However, even if imaging tests show changes in your back, this usually does not require surgery. HOME CARE INSTRUCTIONS For many people, back pain returns.Since low back pain is rarely dangerous, it is often a condition that people can learn to [facility] their own.   Remain active. It is stressful on the back to sit or stand in one place. Do not sit, drive, or stand in one place for more than 30 minutes at a time. Take short walks on level surfaces as soon as pain allows.Try to increase the length of time you walk each day.  Do not stay in bed.Resting more than 1 or 2 days can delay your recovery.  Do not avoid exercise or work.Your body is made to move.It is not dangerous to be active, even though your back may hurt.Your back will likely heal faster if you return to being active before your pain is gone.  Pay attention to your body when you bend and lift. Many people have less discomfortwhen lifting if  they bend their knees, keep the load close to their bodies,and avoid twisting. Often, the most comfortable positions are those that put less stress on your recovering back.  Find a comfortable position to sleep. Use a firm mattress and lie on your side with your knees slightly bent. If you lie on your back, put a pillow under your knees.  Only take over-the-counter or prescription medicines as directed by your caregiver. Over-the-counter medicines to reduce pain and inflammation are often the most helpful.Your caregiver may prescribe muscle relaxant drugs.These medicines help dull your pain so you can more quickly return to your normal activities and healthy exercise.  Put ice on the injured area.  Put ice in a plastic bag.  Place a towel between your skin and the bag.  Leave the ice on for 15-20 minutes, 03-04 times a day for the first 2 to 3 days. After that, ice and heat may be alternated to reduce pain and spasms.  Ask your caregiver about trying back exercises and gentle massage. This may be of some benefit.  Avoid feeling anxious or stressed.Stress increases muscle tension and can worsen back pain.It is important to recognize when you are anxious or stressed and learn ways to manage it.Exercise is a great option. SEEK MEDICAL CARE IF:  You have pain that is not relieved with rest or medicine.  You have pain that does not improve in 1 week.  You have new symptoms.  You are generally not feeling well. SEEK IMMEDIATE MEDICAL CARE IF:   You have pain that radiates from your back into your legs.  You develop new bowel or bladder control problems.  You have unusual weakness or numbness in your arms or legs.  You develop nausea or vomiting.  You develop abdominal pain.  You feel faint. Document Released: 10/23/2005 Document Revised: 04/23/2012 Document Reviewed: 03/13/2011 [facility] Patient Information 2015 [place], [place]. This information is not intended to replace  advice given to you by your health care provider. Make sure you discuss any questions you have with your health care provider. ly next week

## 2014-04-23 NOTE — Progress Notes (Signed)
Pre visit review using our clinic review tool, if applicable. No additional management support is needed unless otherwise documented below in the visit note. 

## 2014-05-01 ENCOUNTER — Telehealth: Payer: Self-pay | Admitting: Family Medicine

## 2014-05-01 NOTE — Telephone Encounter (Signed)
Patients wife left message requesting call back with results for patients x-ray

## 2014-05-01 NOTE — Telephone Encounter (Signed)
I informed pts spouse of belows message:  IMPRESSION:  No fracture or acute finding. Mild degenerative changes.

## 2014-05-03 ENCOUNTER — Encounter: Payer: Self-pay | Admitting: Family Medicine

## 2014-05-03 NOTE — Assessment & Plan Note (Signed)
Xray shows degenerative changes. Keep as active as tolerated. Apply moist heat and topical treatments. Report if worsens

## 2014-05-03 NOTE — Assessment & Plan Note (Signed)
Well controlled, no changes to meds. Encouraged heart healthy diet such as the DASH diet and exercise as tolerated.  °

## 2014-05-03 NOTE — Assessment & Plan Note (Signed)
Tolerating statin, encouraged heart healthy diet, avoid trans fats, minimize simple carbs and saturated fats. Increase exercise as tolerated 

## 2014-05-03 NOTE — Progress Notes (Signed)
Patient ID: Troy Ray, male   DOB: [birth year], 80 y.o.   MRN: [id number] Troy Ray [id number] [birth year] 05/03/2014      Progress Note-Follow Up  Subjective  Chief Complaint  Chief Complaint  Patient presents with  . Follow-up    HPI  Patient is a 80 year old male in today for routine medical care. He is in today for followup her most of the continues to struggle with low back pain with some radicular symptoms right greater than left. No recent falls or,. No change in bowel or bladder habits. No other acute complaints today. Denies CP/palp/SOB/HA/congestion/fevers/GI or GU c/o. Taking meds as prescribed  Past Medical History  Diagnosis Date  . CAD (coronary artery disease)   . Hypertension   . Chronic kidney disease     chronic, stage II  . Hyperlipidemia   . PVD (peripheral vascular disease)   . Postural lightheadedness   . Memory loss   . Dysphagia   . Esophageal stricture     Dr [name]  . CRI (chronic renal insufficiency)   . Sensorineural hearing loss, bilateral   . Carotid stenosis, bilateral     right 60-79% stenosed.  Left 40-59%  . Adenomatous colon polyp   . Diverticulosis   . Unspecified constipation 12/29/2012  . BPH (benign prostatic hyperplasia) 07/14/2013  . AQTMAUQJ(335.4) 12/01/2013    Past Surgical History  Procedure Laterality Date  . Hernia repair  03/23/05    left inguinal Lichtenstein repair, with mesh.  [name]  . Abdominal aortic aneurysm repair  1996    Dr [name]  . Renal artery stent  1996    Dr [name].  Bilateral stenting    Family History  Problem Relation Age of Onset  . Heart disease Father     CAD  . Cancer Neg Hx   . Diabetes Neg Hx   . Coronary artery disease Father     History   Social History  . Marital Status: Married    Spouse Name: N/A    Number of Children: 3  . Years of Education: N/A   Occupational History  . MEDIA ASSISTANT    Social History Main Topics  . Smoking status: Former Research scientist (life sciences)  .  Smokeless tobacco: Never Used     Comment: quit 1980  . Alcohol Use: Yes  . Drug Use: Not on file  . Sexual Activity: Not on file   Other Topics Concern  . Not on file   Social History Narrative  . No narrative on file    Current Outpatient Prescriptions on File Prior to Visit  Medication Sig Dispense Refill  . aspirin EC 81 MG EC tablet Take 1 tablet (81 mg total) by mouth daily.      . finasteride (PROSCAR) 5 MG tablet Take 1 tablet (5 mg total) by mouth daily.  90 tablet  1  . losartan (COZAAR) 50 MG tablet Take 1 tablet (50 mg total) by mouth daily.  90 tablet  3  . methylPREDNIsolone (MEDROL DOSPACK) 4 MG tablet follow package directions  21 tablet  0  . metoprolol succinate (TOPROL-XL) 25 MG 24 hr tablet Take 1 tablet (25 mg total) by mouth daily.  90 tablet  3  . pantoprazole (PROTONIX) 40 MG tablet Take 1 tablet (40 mg total) by mouth daily.  90 tablet  3  . rosuvastatin (CRESTOR) 20 MG tablet Take 1 tablet (20 mg total) by mouth daily.  90 tablet  1  No current facility-administered medications on file prior to visit.    No Known Allergies  Review of Systems  Review of Systems  Constitutional: Negative for fever and malaise/fatigue.  HENT: Negative for congestion.   Eyes: Negative for discharge.  Respiratory: Negative for shortness of breath.   Cardiovascular: Negative for chest pain, palpitations and leg swelling.  Gastrointestinal: Negative for nausea, abdominal pain and diarrhea.  Genitourinary: Negative for dysuria.  Musculoskeletal: Positive for back pain. Negative for falls.  Skin: Negative for rash.  Neurological: Negative for loss of consciousness and headaches.  Endo/Heme/Allergies: Negative for polydipsia.  Psychiatric/Behavioral: Negative for depression and suicidal ideas. The patient is not nervous/anxious and does not have insomnia.     Objective  BP 130/72  Pulse 71  Temp(Src) 98.4 F (36.9 C) (Oral)  Ht 5\' 10"  (1.778 m)  Wt 179 lb 1.9 oz  (81.248 kg)  BMI 25.70 kg/m2  SpO2 96%  Physical Exam  Physical Exam  Constitutional: He is oriented to person, place, and time and well-developed, well-nourished, and in no distress. No distress.  HENT:  Head: Normocephalic and atraumatic.  Eyes: Conjunctivae are normal.  Neck: Neck supple. No thyromegaly present.  Cardiovascular: Normal rate, regular rhythm and normal heart sounds.   No murmur heard. Pulmonary/Chest: Effort normal and breath sounds normal. No respiratory distress.  Abdominal: He exhibits no distension and no mass. There is no tenderness.  Musculoskeletal: He exhibits no edema.  Neurological: He is alert and oriented to person, place, and time.  Skin: Skin is warm.  Psychiatric: Memory, affect and judgment normal.    Lab Results  Component Value Date   TSH 3.321 01/23/2014   Lab Results  Component Value Date   WBC 10.0 01/23/2014   HGB 14.6 01/23/2014   HCT 43.6 01/23/2014   MCV 87.9 01/23/2014   PLT 181 01/23/2014   Lab Results  Component Value Date   CREATININE 1.27 01/23/2014   BUN 20 01/23/2014   NA 138 01/23/2014   K 4.6 01/23/2014   CL 104 01/23/2014   CO2 25 01/23/2014   Lab Results  Component Value Date   ALT 14 01/23/2014   AST 20 01/23/2014   ALKPHOS 42 01/23/2014   BILITOT 0.6 01/23/2014   Lab Results  Component Value Date   CHOL 136 01/23/2014   Lab Results  Component Value Date   HDL 37* 01/23/2014   Lab Results  Component Value Date   LDLCALC 64 01/23/2014   Lab Results  Component Value Date   TRIG 174* 01/23/2014   Lab Results  Component Value Date   CHOLHDL 3.7 01/23/2014     Assessment & Plan  HYPERTENSION Well controlled, no changes to meds. Encouraged heart healthy diet such as the DASH diet and exercise as tolerated.   HYPERLIPIDEMIA Tolerating statin, encouraged heart healthy diet, avoid trans fats, minimize simple carbs and saturated fats. Increase exercise as tolerated  Low back pain with right-sided sciatica Xray  shows degenerative changes. Keep as active as tolerated. Apply moist heat and topical treatments. Report if worsens

## 2014-05-20 ENCOUNTER — Ambulatory Visit (INDEPENDENT_AMBULATORY_CARE_PROVIDER_SITE_OTHER): Payer: Commercial Managed Care - HMO | Admitting: Cardiology

## 2014-05-20 ENCOUNTER — Encounter: Payer: Self-pay | Admitting: Cardiology

## 2014-05-20 ENCOUNTER — Encounter: Payer: Self-pay | Admitting: *Deleted

## 2014-05-20 VITALS — BP 128/80 | HR 53 | Ht 70.0 in | Wt 179.0 lb

## 2014-05-20 DIAGNOSIS — I2510 Atherosclerotic heart disease of native coronary artery without angina pectoris: Secondary | ICD-10-CM

## 2014-05-20 DIAGNOSIS — I6529 Occlusion and stenosis of unspecified carotid artery: Secondary | ICD-10-CM

## 2014-05-20 NOTE — Assessment & Plan Note (Signed)
Continue aspirin and statin. Schedule nuclear study for risk stratification. 

## 2014-05-20 NOTE — Assessment & Plan Note (Signed)
Continue statin. Lipids and liver monitored by primary care. 

## 2014-05-20 NOTE — Patient Instructions (Signed)
Your physician wants you to follow-up in: [place] will receive a reminder letter in the mail two months in advance. If you don't receive a letter, please call our office to schedule the follow-up appointment.   Your physician has requested that you have a lexiscan myoview. For further information please visit [email or web address]. Please follow instruction sheet, as given.   Your physician has requested that you have a carotid duplex. This test is an ultrasound of the carotid arteries in your neck. It looks at blood flow through these arteries that supply the brain with blood. Allow one hour for this exam. There are no restrictions or special instructions.

## 2014-05-20 NOTE — Assessment & Plan Note (Signed)
Blood pressure controlled. Continue present medications. Potassium and renal function monitored by primary care. 

## 2014-05-20 NOTE — Assessment & Plan Note (Signed)
Continue aspirin and statin. Schedule followup carotid Dopplers. 

## 2014-05-20 NOTE — Progress Notes (Signed)
HPI: 80 year old male previously followed by Dr. Verl Blalock for followup of coronary artery disease. Patient had coronary artery bypassing graft in 2000. Nuclear study in July of 2010 showed an ejection fraction of 78% and no ischemia. Echocardiogram in December of 2011 showed normal LV function and grade 1 diastolic dysfunction. Mild aortic insufficiency. Carotid Dopplers July 2014 showed 40-59% bilateral stenosis and followup recommended in one year. Patient also with history of abdominal aneurysm repair and intervention for renal artery stenosis. Since he was last seen He denies dyspnea or chest pain. No syncope. Occasional bouts of nausea.  Current Outpatient Prescriptions  Medication Sig Dispense Refill  . aspirin EC 81 MG EC tablet Take 1 tablet (81 mg total) by mouth daily.      . diazepam (VALIUM) 2 MG tablet Take 1 tablet (2 mg total) by mouth every 6 (six) hours as needed for anxiety.  30 tablet  0  . finasteride (PROSCAR) 5 MG tablet Take 1 tablet (5 mg total) by mouth daily.  90 tablet  1  . losartan (COZAAR) 50 MG tablet Take 1 tablet (50 mg total) by mouth daily.  90 tablet  3  . methylPREDNIsolone (MEDROL DOSPACK) 4 MG tablet follow package directions  21 tablet  0  . metoprolol succinate (TOPROL-XL) 25 MG 24 hr tablet Take 1 tablet (25 mg total) by mouth daily.  90 tablet  3  . pantoprazole (PROTONIX) 40 MG tablet Take 1 tablet (40 mg total) by mouth daily.  90 tablet  3  . rosuvastatin (CRESTOR) 20 MG tablet Take 1 tablet (20 mg total) by mouth daily.  90 tablet  1   No current facility-administered medications for this visit.     Past Medical History  Diagnosis Date  . CAD (coronary artery disease)   . Hypertension   . Chronic kidney disease     chronic, stage II  . Hyperlipidemia   . PVD (peripheral vascular disease)   . Postural lightheadedness   . Memory loss   . Dysphagia   . Esophageal stricture     Dr [name]  . CRI (chronic renal insufficiency)   .  Sensorineural hearing loss, bilateral   . Carotid stenosis, bilateral     right 60-79% stenosed.  Left 40-59%  . Adenomatous colon polyp   . Diverticulosis   . Unspecified constipation 12/29/2012  . BPH (benign prostatic hyperplasia) 07/14/2013  . CZYSAYTK(160.1) 12/01/2013    Past Surgical History  Procedure Laterality Date  . Hernia repair  03/23/05    left inguinal Lichtenstein repair, with mesh.  [name]  . Abdominal aortic aneurysm repair  1996    Dr [name]  . Renal artery stent  1996    Dr [name].  Bilateral stenting  . Coronary artery bypass graft      History   Social History  . Marital Status: Married    Spouse Name: N/A    Number of Children: 3  . Years of Education: N/A   Occupational History  . MEDIA ASSISTANT    Social History Main Topics  . Smoking status: Former Research scientist (life sciences)  . Smokeless tobacco: Never Used     Comment: quit 1980  . Alcohol Use: Yes  . Drug Use: Not on file  . Sexual Activity: Not on file   Other Topics Concern  . Not on file   Social History Narrative  . No narrative on file    ROS: no fevers or chills, productive cough, hemoptysis,  dysphasia, odynophagia, melena, hematochezia, dysuria, hematuria, rash, seizure activity, orthopnea, PND, pedal edema, claudication. Remaining systems are negative.  Physical Exam: Well-developed well-nourished in no acute distress.  Skin is warm and dry.  HEENT is normal.  Neck is supple.  Chest is clear to auscultation with normal expansion.  Cardiovascular exam is regular rate and rhythm.  Abdominal exam nontender or distended. No masses palpated. Extremities show no edema. neuro grossly intact  ECG Sinus bradycardia at a rate of 53. Nonspecific ST changes.

## 2014-06-03 ENCOUNTER — Ambulatory Visit (HOSPITAL_COMMUNITY): Payer: Medicare HMO | Attending: Cardiology | Admitting: Radiology

## 2014-06-03 ENCOUNTER — Ambulatory Visit (HOSPITAL_BASED_OUTPATIENT_CLINIC_OR_DEPARTMENT_OTHER): Payer: Medicare HMO | Admitting: Cardiology

## 2014-06-03 VITALS — BP 210/89 | HR 55 | Ht 70.0 in | Wt 178.0 lb

## 2014-06-03 DIAGNOSIS — I10 Essential (primary) hypertension: Secondary | ICD-10-CM | POA: Insufficient documentation

## 2014-06-03 DIAGNOSIS — Z87891 Personal history of nicotine dependence: Secondary | ICD-10-CM | POA: Diagnosis not present

## 2014-06-03 DIAGNOSIS — I2510 Atherosclerotic heart disease of native coronary artery without angina pectoris: Secondary | ICD-10-CM | POA: Insufficient documentation

## 2014-06-03 DIAGNOSIS — Z951 Presence of aortocoronary bypass graft: Secondary | ICD-10-CM | POA: Insufficient documentation

## 2014-06-03 DIAGNOSIS — I6529 Occlusion and stenosis of unspecified carotid artery: Secondary | ICD-10-CM | POA: Diagnosis not present

## 2014-06-03 DIAGNOSIS — Z8249 Family history of ischemic heart disease and other diseases of the circulatory system: Secondary | ICD-10-CM | POA: Insufficient documentation

## 2014-06-03 DIAGNOSIS — E785 Hyperlipidemia, unspecified: Secondary | ICD-10-CM | POA: Insufficient documentation

## 2014-06-03 MED ORDER — TECHNETIUM TC 99M SESTAMIBI GENERIC - CARDIOLITE
33.0000 | Freq: Once | INTRAVENOUS | Status: AC | PRN
  Administered 2014-06-03: 33 via INTRAVENOUS

## 2014-06-03 MED ORDER — REGADENOSON 0.4 MG/5ML IV SOLN
0.4000 mg | Freq: Once | INTRAVENOUS | Status: AC
  Administered 2014-06-03: 0.4 mg via INTRAVENOUS

## 2014-06-03 MED ORDER — TECHNETIUM TC 99M SESTAMIBI GENERIC - CARDIOLITE
11.0000 | Freq: Once | INTRAVENOUS | Status: AC | PRN
  Administered 2014-06-03: 11 via INTRAVENOUS

## 2014-06-03 NOTE — Progress Notes (Signed)
[facility] [address] [place], [place] [address] [phone]    Cardiology Nuclear Med Study  Troy Ray is a 80 y.o. male     MRN : [id number]     DOB: [birth year]  Procedure Date: 06/03/2014  Nuclear Med Background Indication for Stress Test:  Evaluation for Ischemia, Graft Patency and Abnormal EKG History:  CAD, Echo 2011 EF 60-65%, MPI 2010 (normal) EF 69% Cardiac Risk Factors: Carotid Disease, Strong Family History - CAD, History of Smoking, Hypertension, Lipids and PVD  Symptoms:  None indicated   Nuclear Pre-Procedure Caffeine/Decaff Intake:  11:00pm NPO After: 11:00pm   Lungs:  clear O2 Sat: 98% on room air. IV 0.9% NS with Angio Cath:  22g  IV Site: R Hand x 1, tolerated well  IV Started by:  Irven Baltimore, RN  Chest Size (in):  42 Cup Size: n/a  Height: 5\' 10"  (1.778 m)  Weight:  178 lb (80.74 kg)  BMI:  Body mass index is 25.54 kg/(m^2). Tech Comments:  No medications today. Irven Baltimore, RN.    Nuclear Med Study 1 or 2 day study: 1 day  Stress Test Type:  Carlton Adam  Reading MD: N/A  Order Authorizing Provider:  Kirk Ruths, MD  Resting Radionuclide: Technetium 76m Sestamibi  Resting Radionuclide Dose: 11.0 mCi   Stress Radionuclide:  Technetium 54m Sestamibi  Stress Radionuclide Dose: 33.0 mCi           Stress Protocol Rest HR: 55 Stress HR: 78  Rest BP: 210/89 Stress BP: 133/100  Exercise Time (min): n/a METS: n/a           Dose of Adenosine (mg):  n/a Dose of Lexiscan: 0.4 mg  Dose of Atropine (mg): n/a Dose of Dobutamine: n/a mcg/kg/min (at max HR)  Stress Test Technologist: Glade Lloyd, BS-ES  Nuclear Technologist:  Vedia Pereyra, CNMT     Rest Procedure:  Myocardial perfusion imaging was performed at rest 45 minutes following the intravenous administration of Technetium 72m Sestamibi. Rest ECG: NSR - Normal EKG  Stress Procedure:  The patient received IV Lexiscan 0.4 mg over 15-seconds.  Technetium  52m Sestamibi injected at 30-seconds.  Quantitative spect images were obtained after a 45 minute delay.  During the infusion of Lexiscan the patient complained of a fullness in his head and stomach upset.  These symptoms began to resolve in recovery.  Stress ECG: No significant change from baseline ECG  QPS Raw Data Images:  Normal; no motion artifact; normal heart/lung ratio. Stress Images:  There is mild apical thinning with normal uptake in other regions. Rest Images:  There is mild apical thinning with normal uptake in other regions. Subtraction (SDS):  No evidence of ischemia. Transient Ischemic Dilatation (Normal <1.22):  1.04 Lung/Heart Ratio (Normal <0.45):  0.30  Quantitative Gated Spect Images QGS EDV:  95 ml QGS ESV:  44 ml  Impression Exercise Capacity:  Lexiscan with no exercise. BP Response:  Normal blood pressure response. Clinical Symptoms:  No chest pain. ECG Impression:  No significant ST segment change suggestive of ischemia. Comparison with Prior Nuclear Study: No images to compare  Overall Impression:  Normal stress nuclear study. Mild apical thinning on stress and rest images. No ischemia.  LV Ejection Fraction: 53%.  LV Wall Motion:  NL LV Function; NL Wall Motion  Darlin Coco MD

## 2014-06-03 NOTE — Progress Notes (Signed)
Carotid duplex performed 

## 2014-06-05 ENCOUNTER — Encounter: Payer: Self-pay | Admitting: Cardiology

## 2014-06-05 NOTE — Telephone Encounter (Signed)
Returning call regarding stress test results

## 2014-06-05 NOTE — Telephone Encounter (Signed)
This encounter was created in error - please disregard.

## 2014-07-09 ENCOUNTER — Encounter: Payer: Self-pay | Admitting: Gastroenterology

## 2014-07-27 ENCOUNTER — Ambulatory Visit: Payer: Commercial Managed Care - HMO | Admitting: Family Medicine

## 2014-08-13 ENCOUNTER — Encounter: Payer: Self-pay | Admitting: Family Medicine

## 2014-08-13 ENCOUNTER — Ambulatory Visit (INDEPENDENT_AMBULATORY_CARE_PROVIDER_SITE_OTHER): Payer: Commercial Managed Care - HMO | Admitting: Family Medicine

## 2014-08-13 VITALS — BP 144/78 | HR 56 | Temp 97.7°F | Ht 70.0 in | Wt 184.0 lb

## 2014-08-13 DIAGNOSIS — K5900 Constipation, unspecified: Secondary | ICD-10-CM

## 2014-08-13 DIAGNOSIS — Z23 Encounter for immunization: Secondary | ICD-10-CM

## 2014-08-13 DIAGNOSIS — I10 Essential (primary) hypertension: Secondary | ICD-10-CM

## 2014-08-13 DIAGNOSIS — R739 Hyperglycemia, unspecified: Secondary | ICD-10-CM

## 2014-08-13 DIAGNOSIS — E785 Hyperlipidemia, unspecified: Secondary | ICD-10-CM

## 2014-08-13 NOTE — Progress Notes (Signed)
Pre visit review using our clinic review tool, if applicable. No additional management support is needed unless otherwise documented below in the visit note. 

## 2014-08-13 NOTE — Patient Instructions (Signed)

## 2014-08-14 ENCOUNTER — Other Ambulatory Visit: Payer: Commercial Managed Care - HMO

## 2014-08-14 LAB — CBC
HCT: 44.1 % (ref 39.0–52.0)
HEMOGLOBIN: 14.4 g/dL (ref 13.0–17.0)
MCHC: 32.6 g/dL (ref 30.0–36.0)
MCV: 90.1 fl (ref 78.0–100.0)
PLATELETS: 201 10*3/uL (ref 150.0–400.0)
RBC: 4.9 Mil/uL (ref 4.22–5.81)
RDW: 14.8 % (ref 11.5–15.5)
WBC: 9.1 10*3/uL (ref 4.0–10.5)

## 2014-08-14 LAB — HEPATIC FUNCTION PANEL
ALBUMIN: 3.4 g/dL — AB (ref 3.5–5.2)
ALT: 20 U/L (ref 0–53)
AST: 24 U/L (ref 0–37)
Alkaline Phosphatase: 40 U/L (ref 39–117)
Bilirubin, Direct: 0.1 mg/dL (ref 0.0–0.3)
Total Bilirubin: 0.6 mg/dL (ref 0.2–1.2)
Total Protein: 7.3 g/dL (ref 6.0–8.3)

## 2014-08-14 LAB — RENAL FUNCTION PANEL
ALBUMIN: 3.4 g/dL — AB (ref 3.5–5.2)
BUN: 21 mg/dL (ref 6–23)
CO2: 25 meq/L (ref 19–32)
Calcium: 9 mg/dL (ref 8.4–10.5)
Chloride: 107 mEq/L (ref 96–112)
Creatinine, Ser: 1.3 mg/dL (ref 0.4–1.5)
GFR: 55.84 mL/min — AB (ref >60.00)
Glucose, Bld: 105 mg/dL — ABNORMAL HIGH (ref 70–99)
Phosphorus: 2.9 mg/dL (ref 2.3–4.6)
Potassium: 4.3 mEq/L (ref 3.5–5.1)
Sodium: 138 mEq/L (ref 135–145)

## 2014-08-14 LAB — LIPID PANEL
Cholesterol: 130 mg/dL (ref 0–200)
HDL: 33.6 mg/dL — AB (ref >39.00)
LDL Cholesterol: 73 mg/dL (ref 0–99)
NONHDL: 96.4
Total CHOL/HDL Ratio: 4
Triglycerides: 116 mg/dL (ref 0.0–149.0)
VLDL: 23.2 mg/dL (ref 0.0–40.0)

## 2014-08-14 LAB — TSH: TSH: 1.55 u[IU]/mL (ref 0.35–4.50)

## 2014-08-14 LAB — HEMOGLOBIN A1C: Hgb A1c MFr Bld: 6 % (ref 4.6–6.5)

## 2014-08-16 ENCOUNTER — Encounter: Payer: Self-pay | Admitting: Family Medicine

## 2014-08-16 NOTE — Progress Notes (Signed)
Patient ID: Troy Ray, male   DOB: 1927/12/10, 80 y.o.   MRN: [id number] Troy Ray [id number] 1927-11-11 08/16/2014      Progress Note-Follow Up  Subjective  Chief Complaint  Chief Complaint  Patient presents with  . Follow-up  . Injections    prevnar and flu    HPI  Patient is a 80 year old male in today for routine medical care. Patient in today for followup. Feeling fairly well. Requesting a switch from Crestor to a different statin for financial reasons. Denies any recent illness. Denies CP/palp/SOB/HA/congestion/fevers/GI or GU c/o. Taking meds as prescribed  Past Medical History  Diagnosis Date  . CAD (coronary artery disease)   . Hypertension   . Chronic kidney disease     chronic, stage II  . Hyperlipidemia   . PVD (peripheral vascular disease)   . Postural lightheadedness   . Memory loss   . Dysphagia   . Esophageal stricture     Dr [name]  . CRI (chronic renal insufficiency)   . Sensorineural hearing loss, bilateral   . Carotid stenosis, bilateral     right 60-79% stenosed.  Left 40-59%  . Adenomatous colon polyp   . Diverticulosis   . Unspecified constipation 12/29/2012  . BPH (benign prostatic hyperplasia) 07/14/2013  . QQPYPPJK(932.6) 12/01/2013    Past Surgical History  Procedure Laterality Date  . Hernia repair  03/23/05    left inguinal Lichtenstein repair, with mesh.  Troy Skates MD  . Abdominal aortic aneurysm repair  1996    Dr [name]  . Renal artery stent  1996    Dr [name].  Bilateral stenting  . Coronary artery bypass graft      Family History  Problem Relation Age of Onset  . Heart disease Father     CAD  . Cancer Neg Hx   . Diabetes Neg Hx   . Coronary artery disease Father     History   Social History  . Marital Status: Married    Spouse Name: N/A    Number of Children: 3  . Years of Education: N/A   Occupational History  . MEDIA ASSISTANT    Social History Main Topics  . Smoking status: Former Research scientist (life sciences)  .  Smokeless tobacco: Never Used     Comment: quit 1980  . Alcohol Use: Yes  . Drug Use: Not on file  . Sexual Activity: Not on file   Other Topics Concern  . Not on file   Social History Narrative  . No narrative on file    Current Outpatient Prescriptions on File Prior to Visit  Medication Sig Dispense Refill  . aspirin EC 81 MG EC tablet Take 1 tablet (81 mg total) by mouth daily.      . finasteride (PROSCAR) 5 MG tablet Take 1 tablet (5 mg total) by mouth daily.  90 tablet  1  . losartan (COZAAR) 50 MG tablet Take 1 tablet (50 mg total) by mouth daily.  90 tablet  3  . metoprolol succinate (TOPROL-XL) 25 MG 24 hr tablet Take 1 tablet (25 mg total) by mouth daily.  90 tablet  3  . pantoprazole (PROTONIX) 40 MG tablet Take 1 tablet (40 mg total) by mouth daily.  90 tablet  3   No current facility-administered medications on file prior to visit.    No Known Allergies  Review of Systems  Review of Systems  Constitutional: Negative for fever and malaise/fatigue.  HENT: Negative for congestion.  Eyes: Negative for discharge.  Respiratory: Negative for shortness of breath.   Cardiovascular: Negative for chest pain, palpitations and leg swelling.  Gastrointestinal: Negative for nausea, abdominal pain and diarrhea.  Genitourinary: Negative for dysuria.  Musculoskeletal: Negative for falls.  Skin: Negative for rash.  Neurological: Negative for loss of consciousness and headaches.  Endo/Heme/Allergies: Negative for polydipsia.  Psychiatric/Behavioral: Negative for depression and suicidal ideas. The patient is not nervous/anxious and does not have insomnia.     Objective  BP 170/63  Pulse 56  Temp(Src) 97.7 F (36.5 C) (Oral)  Ht 5\' 10"  (1.778 m)  Wt 184 lb (83.462 kg)  BMI 26.40 kg/m2  SpO2 98%  Physical Exam  Physical Exam  Constitutional: He is oriented to person, place, and time and well-developed, well-nourished, and in no distress. No distress.  HENT:  Head:  Normocephalic and atraumatic.  Eyes: Conjunctivae are normal.  Neck: Neck supple. No thyromegaly present.  Cardiovascular: Normal rate, regular rhythm and normal heart sounds.   No murmur heard. Pulmonary/Chest: Effort normal and breath sounds normal. No respiratory distress.  Abdominal: He exhibits no distension and no mass. There is no tenderness.  Musculoskeletal: He exhibits no edema.  Neurological: He is alert and oriented to person, place, and time.  Skin: Skin is warm.  Psychiatric: Memory, affect and judgment normal.    Lab Results  Component Value Date   TSH 1.55 08/14/2014   Lab Results  Component Value Date   WBC 9.1 08/14/2014   HGB 14.4 08/14/2014   HCT 44.1 08/14/2014   MCV 90.1 08/14/2014   PLT 201.0 08/14/2014   Lab Results  Component Value Date   CREATININE 1.3 08/14/2014   BUN 21 08/14/2014   NA 138 08/14/2014   K 4.3 08/14/2014   CL 107 08/14/2014   CO2 25 08/14/2014   Lab Results  Component Value Date   ALT 20 08/14/2014   AST 24 08/14/2014   ALKPHOS 40 08/14/2014   BILITOT 0.6 08/14/2014   Lab Results  Component Value Date   CHOL 130 08/14/2014   Lab Results  Component Value Date   HDL 33.60* 08/14/2014   Lab Results  Component Value Date   LDLCALC 73 08/14/2014   Lab Results  Component Value Date   TRIG 116.0 08/14/2014   Lab Results  Component Value Date   CHOLHDL 4 08/14/2014     Assessment & Plan  Constipation Encouraged increased hydration and fiber in diet. Daily probiotics. If bowels not moving can use MOM 2 tbls po in 4 oz of warm prune juice by mouth every 2-3 days. If no results then repeat in 4 hours with  Dulcolax suppository pr, may repeat again in 4 more hours as needed. Seek care if symptoms worsen. Consider daily Miralax and/or Dulcolax if symptoms persist.   Essential hypertension Improved on recheck but still elevated. Encouraged heart healthy diet such as the DASH diet and exercise as tolerated.   Hyperlipidemia Requests  switch from Crestor to Lipitor for financial considerations. Denies any trouble with either med in past. Will switch to Atorvasatin 20 mg now that lipid panel is acceptable.

## 2014-08-16 NOTE — Assessment & Plan Note (Signed)
Encouraged increased hydration and fiber in diet. Daily probiotics. If bowels not moving can use MOM 2 tbls po in 4 oz of warm prune juice by mouth every 2-3 days. If no results then repeat in 4 hours with  Dulcolax suppository pr, may repeat again in 4 more hours as needed. Seek care if symptoms worsen. Consider daily Miralax and/or Dulcolax if symptoms persist.  

## 2014-08-16 NOTE — Assessment & Plan Note (Signed)
Requests switch from Crestor to Lipitor for financial considerations. Denies any trouble with either med in past. Will switch to Atorvasatin 20 mg now that lipid panel is acceptable.

## 2014-08-16 NOTE — Assessment & Plan Note (Signed)
Improved on recheck but still elevated. Encouraged heart healthy diet such as the DASH diet and exercise as tolerated.

## 2014-08-17 ENCOUNTER — Telehealth: Payer: Self-pay

## 2014-08-17 MED ORDER — ATORVASTATIN CALCIUM 20 MG PO TABS: 20.0000 mg | ORAL_TABLET | Freq: Every day | ORAL | Status: DC

## 2014-08-17 NOTE — Telephone Encounter (Signed)
Message copied by Varney Daily on Mon Aug 17, 2014  7:50 AM ------      Message from: Penni Homans A      Created: Sun Aug 16, 2014  4:49 PM       They want to switch to Atorvastatin 20 mg po qhs now that the lipid panel is good, please send in a 30day supply with 5 rf or 90 day supply with 1 rf at patient discretion.  ------

## 2014-08-17 NOTE — Telephone Encounter (Signed)
Message copied by Varney Daily on Mon Aug 17, 2014  7:55 AM ------      Message from: Penni Homans A      Created: Sun Aug 16, 2014  4:49 PM       They want to switch to Atorvastatin 20 mg po qhs now that the lipid panel is good, please send in a 30day supply with 5 rf or 90 day supply with 1 rf at patient discretion.  ------

## 2014-08-20 ENCOUNTER — Inpatient Hospital Stay (HOSPITAL_BASED_OUTPATIENT_CLINIC_OR_DEPARTMENT_OTHER)
Admission: EM | Admit: 2014-08-20 | Discharge: 2014-08-22 | DRG: 195 | Disposition: A | Discharge status: Home / Self Care | Payer: Medicare HMO | Attending: Internal Medicine | Admitting: Internal Medicine

## 2014-08-20 ENCOUNTER — Emergency Department (HOSPITAL_BASED_OUTPATIENT_CLINIC_OR_DEPARTMENT_OTHER): Payer: Medicare HMO

## 2014-08-20 ENCOUNTER — Encounter (HOSPITAL_BASED_OUTPATIENT_CLINIC_OR_DEPARTMENT_OTHER): Payer: Self-pay | Admitting: Emergency Medicine

## 2014-08-20 DIAGNOSIS — E782 Mixed hyperlipidemia: Secondary | ICD-10-CM | POA: Diagnosis present

## 2014-08-20 DIAGNOSIS — Z9889 Other specified postprocedural states: Secondary | ICD-10-CM

## 2014-08-20 DIAGNOSIS — N183 Chronic kidney disease, stage 3 unspecified: Secondary | ICD-10-CM

## 2014-08-20 DIAGNOSIS — E785 Hyperlipidemia, unspecified: Secondary | ICD-10-CM | POA: Diagnosis present

## 2014-08-20 DIAGNOSIS — I739 Peripheral vascular disease, unspecified: Secondary | ICD-10-CM | POA: Diagnosis present

## 2014-08-20 DIAGNOSIS — Z951 Presence of aortocoronary bypass graft: Secondary | ICD-10-CM

## 2014-08-20 DIAGNOSIS — I6523 Occlusion and stenosis of bilateral carotid arteries: Secondary | ICD-10-CM | POA: Diagnosis present

## 2014-08-20 DIAGNOSIS — N400 Enlarged prostate without lower urinary tract symptoms: Secondary | ICD-10-CM

## 2014-08-20 DIAGNOSIS — Z7982 Long term (current) use of aspirin: Secondary | ICD-10-CM

## 2014-08-20 DIAGNOSIS — R413 Other amnesia: Secondary | ICD-10-CM

## 2014-08-20 DIAGNOSIS — I129 Hypertensive chronic kidney disease with stage 1 through stage 4 chronic kidney disease, or unspecified chronic kidney disease: Secondary | ICD-10-CM | POA: Diagnosis present

## 2014-08-20 DIAGNOSIS — K222 Esophageal obstruction: Secondary | ICD-10-CM

## 2014-08-20 DIAGNOSIS — R279 Unspecified lack of coordination: Secondary | ICD-10-CM

## 2014-08-20 DIAGNOSIS — R0681 Apnea, not elsewhere classified: Secondary | ICD-10-CM

## 2014-08-20 DIAGNOSIS — I10 Essential (primary) hypertension: Secondary | ICD-10-CM

## 2014-08-20 DIAGNOSIS — Z86010 Personal history of colonic polyps: Secondary | ICD-10-CM | POA: Diagnosis not present

## 2014-08-20 DIAGNOSIS — J189 Pneumonia, unspecified organism: Principal | ICD-10-CM

## 2014-08-20 DIAGNOSIS — R42 Dizziness and giddiness: Secondary | ICD-10-CM

## 2014-08-20 DIAGNOSIS — Z87891 Personal history of nicotine dependence: Secondary | ICD-10-CM | POA: Diagnosis not present

## 2014-08-20 DIAGNOSIS — R110 Nausea: Secondary | ICD-10-CM | POA: Diagnosis present

## 2014-08-20 DIAGNOSIS — N182 Chronic kidney disease, stage 2 (mild): Secondary | ICD-10-CM

## 2014-08-20 DIAGNOSIS — I2510 Atherosclerotic heart disease of native coronary artery without angina pectoris: Secondary | ICD-10-CM | POA: Diagnosis present

## 2014-08-20 LAB — CBC
HCT: 43 % (ref 39.0–52.0)
HEMATOCRIT: 44.2 % (ref 39.0–52.0)
Hemoglobin: 15 g/dL (ref 13.0–17.0)
Hemoglobin: 14.3 g/dL (ref 13.0–17.0)
MCH: 30.2 pg (ref 26.0–34.0)
MCH: 29.9 pg (ref 26.0–34.0)
MCHC: 33.9 g/dL (ref 30.0–36.0)
MCHC: 33.3 g/dL (ref 30.0–36.0)
MCV: 88.9 fL (ref 78.0–100.0)
MCV: 89.8 fL (ref 78.0–100.0)
PLATELETS: 170 10*3/uL (ref 150–400)
Platelets: 193 10*3/uL (ref 150–400)
RBC: 4.97 MIL/uL (ref 4.22–5.81)
RBC: 4.79 MIL/uL (ref 4.22–5.81)
RDW: 14.1 % (ref 11.5–15.5)
RDW: 13.9 % (ref 11.5–15.5)
WBC: 19.6 10*3/uL — ABNORMAL HIGH (ref 4.0–10.5)
WBC: 29.1 10*3/uL — ABNORMAL HIGH (ref 4.0–10.5)

## 2014-08-20 LAB — TROPONIN I

## 2014-08-20 LAB — DIFFERENTIAL
BASOS PCT: 0 % (ref 0–1)
Basophils Absolute: 0 10*3/uL (ref 0.0–0.1)
EOS PCT: 1 % (ref 0–5)
Eosinophils Absolute: 0.2 10*3/uL (ref 0.0–0.7)
Lymphocytes Relative: 10 % — ABNORMAL LOW (ref 12–46)
Lymphs Abs: 1.9 10*3/uL (ref 0.7–4.0)
Monocytes Absolute: 0.7 10*3/uL (ref 0.1–1.0)
Monocytes Relative: 3 % (ref 3–12)
NEUTROS ABS: 16.8 10*3/uL — AB (ref 1.7–7.7)
Neutrophils Relative %: 86 % — ABNORMAL HIGH (ref 43–77)

## 2014-08-20 LAB — URINALYSIS, ROUTINE W REFLEX MICROSCOPIC
BILIRUBIN URINE: NEGATIVE
Glucose, UA: NEGATIVE mg/dL
Hgb urine dipstick: NEGATIVE
KETONES UR: NEGATIVE mg/dL
Leukocytes, UA: NEGATIVE
NITRITE: NEGATIVE
PROTEIN: NEGATIVE mg/dL
SPECIFIC GRAVITY, URINE: 1.016 (ref 1.005–1.030)
Urobilinogen, UA: 0.2 mg/dL (ref 0.0–1.0)
pH: 5.5 (ref 5.0–8.0)

## 2014-08-20 LAB — COMPREHENSIVE METABOLIC PANEL
ALT: 16 U/L (ref 0–53)
AST: 22 U/L (ref 0–37)
Albumin: 3.8 g/dL (ref 3.5–5.2)
Alkaline Phosphatase: 49 U/L (ref 39–117)
Anion gap: 18 — ABNORMAL HIGH (ref 5–15)
BUN: 27 mg/dL — ABNORMAL HIGH (ref 6–23)
CO2: 21 meq/L (ref 19–32)
CREATININE: 1.4 mg/dL — AB (ref 0.50–1.35)
Calcium: 9.4 mg/dL (ref 8.4–10.5)
Chloride: 100 mEq/L (ref 96–112)
GFR calc non Af Amer: 45 mL/min — ABNORMAL LOW (ref >90)
GFR, EST AFRICAN AMERICAN: 52 mL/min — AB (ref >90)
GLUCOSE: 180 mg/dL — AB (ref 70–99)
Potassium: 4 mEq/L (ref 3.7–5.3)
Sodium: 139 mEq/L (ref 137–147)
Total Bilirubin: 0.5 mg/dL (ref 0.3–1.2)
Total Protein: 7.2 g/dL (ref 6.0–8.3)

## 2014-08-20 LAB — CREATININE, SERUM
CREATININE: 1.31 mg/dL (ref 0.50–1.35)
GFR, EST AFRICAN AMERICAN: 56 mL/min — AB (ref >90)
GFR, EST NON AFRICAN AMERICAN: 48 mL/min — AB (ref >90)

## 2014-08-20 LAB — LIPASE, BLOOD: Lipase: 37 U/L (ref 11–59)

## 2014-08-20 MED ORDER — HEPARIN SODIUM (PORCINE) 5000 UNIT/ML IJ SOLN
5000.0000 [IU] | Freq: Three times a day (TID) | INTRAMUSCULAR | Status: DC
  Administered 2014-08-20 – 2014-08-22 (×4): 5000 [IU] via SUBCUTANEOUS
  Filled 2014-08-20 (×8): qty 1

## 2014-08-20 MED ORDER — POLYETHYLENE GLYCOL 3350 17 G PO PACK
17.0000 g | PACK | Freq: Every day | ORAL | Status: DC | PRN
  Filled 2014-08-20: qty 1

## 2014-08-20 MED ORDER — ROSUVASTATIN CALCIUM 20 MG PO TABS
20.0000 mg | ORAL_TABLET | Freq: Every day | ORAL | Status: DC
  Administered 2014-08-20 – 2014-08-21 (×2): 20 mg via ORAL
  Filled 2014-08-20 (×3): qty 1

## 2014-08-20 MED ORDER — ALBUTEROL SULFATE (2.5 MG/3ML) 0.083% IN NEBU: 2.5000 mg | INHALATION_SOLUTION | RESPIRATORY_TRACT | Status: DC | PRN

## 2014-08-20 MED ORDER — DEXTROSE 5 % IV SOLN
1.0000 g | Freq: Once | INTRAVENOUS | Status: AC
  Administered 2014-08-20: 1 g via INTRAVENOUS

## 2014-08-20 MED ORDER — ONDANSETRON HCL 4 MG/2ML IJ SOLN
4.0000 mg | Freq: Once | INTRAMUSCULAR | Status: AC
  Administered 2014-08-20: 4 mg via INTRAVENOUS
  Filled 2014-08-20: qty 2

## 2014-08-20 MED ORDER — DEXTROSE 5 % IV SOLN
1.0000 g | INTRAVENOUS | Status: DC
  Administered 2014-08-21 – 2014-08-22 (×2): 1 g via INTRAVENOUS
  Filled 2014-08-20 (×2): qty 10

## 2014-08-20 MED ORDER — METOPROLOL SUCCINATE ER 25 MG PO TB24
25.0000 mg | ORAL_TABLET | Freq: Every day | ORAL | Status: DC
  Administered 2014-08-20 – 2014-08-21 (×2): 25 mg via ORAL
  Filled 2014-08-20 (×3): qty 1

## 2014-08-20 MED ORDER — DEXTROSE 5 % IV SOLN
500.0000 mg | Freq: Once | INTRAVENOUS | Status: AC
  Administered 2014-08-20: 500 mg via INTRAVENOUS
  Filled 2014-08-20: qty 500

## 2014-08-20 MED ORDER — ACETAMINOPHEN 650 MG RE SUPP: 650.0000 mg | Freq: Four times a day (QID) | RECTAL | Status: DC | PRN

## 2014-08-20 MED ORDER — SODIUM CHLORIDE 0.9 % IV BOLUS (SEPSIS)
500.0000 mL | Freq: Once | INTRAVENOUS | Status: AC
  Administered 2014-08-20: 500 mL via INTRAVENOUS

## 2014-08-20 MED ORDER — CEFTRIAXONE SODIUM 1 G IJ SOLR
INTRAMUSCULAR | Status: AC
  Filled 2014-08-20: qty 10

## 2014-08-20 MED ORDER — ASPIRIN EC 81 MG PO TBEC
81.0000 mg | DELAYED_RELEASE_TABLET | Freq: Every day | ORAL | Status: DC
  Administered 2014-08-20 – 2014-08-21 (×2): 81 mg via ORAL
  Filled 2014-08-20 (×3): qty 1

## 2014-08-20 MED ORDER — ALUM & MAG HYDROXIDE-SIMETH 200-200-20 MG/5ML PO SUSP
30.0000 mL | Freq: Four times a day (QID) | ORAL | Status: DC | PRN
Start: 2014-08-20 — End: 2014-08-22

## 2014-08-20 MED ORDER — FINASTERIDE 5 MG PO TABS
5.0000 mg | ORAL_TABLET | Freq: Every day | ORAL | Status: DC
  Administered 2014-08-20 – 2014-08-22 (×3): 5 mg via ORAL
  Filled 2014-08-20 (×3): qty 1

## 2014-08-20 MED ORDER — SODIUM CHLORIDE 0.9 % IV SOLN
INTRAVENOUS | Status: DC
  Administered 2014-08-20: 17:00:00 via INTRAVENOUS

## 2014-08-20 MED ORDER — PANTOPRAZOLE SODIUM 40 MG PO TBEC
40.0000 mg | DELAYED_RELEASE_TABLET | Freq: Every day | ORAL | Status: DC
  Administered 2014-08-21 – 2014-08-22 (×2): 40 mg via ORAL
  Filled 2014-08-20 (×3): qty 1

## 2014-08-20 MED ORDER — ONDANSETRON HCL 4 MG/2ML IJ SOLN
4.0000 mg | Freq: Four times a day (QID) | INTRAMUSCULAR | Status: DC | PRN
Start: 2014-08-20 — End: 2014-08-22

## 2014-08-20 MED ORDER — ONDANSETRON HCL 4 MG PO TABS: 4.0000 mg | ORAL_TABLET | Freq: Four times a day (QID) | ORAL | Status: DC | PRN

## 2014-08-20 MED ORDER — DEXTROSE 5 % IV SOLN
500.0000 mg | INTRAVENOUS | Status: DC
  Administered 2014-08-21 – 2014-08-22 (×2): 500 mg via INTRAVENOUS
  Filled 2014-08-20 (×2): qty 500

## 2014-08-20 MED ORDER — LOSARTAN POTASSIUM 50 MG PO TABS
50.0000 mg | ORAL_TABLET | Freq: Every day | ORAL | Status: DC
  Administered 2014-08-20 – 2014-08-22 (×3): 50 mg via ORAL
  Filled 2014-08-20 (×3): qty 1

## 2014-08-20 MED ORDER — ACETAMINOPHEN 325 MG PO TABS: 650.0000 mg | ORAL_TABLET | Freq: Four times a day (QID) | ORAL | Status: DC | PRN

## 2014-08-20 NOTE — ED Provider Notes (Signed)
CSN: [id number]     Arrival date & time 08/20/14  9163 History   First MD Initiated Contact with Patient 08/20/14 0654     Chief Complaint  Patient presents with  . Nausea     (Consider location/radiation/quality/duration/timing/severity/associated sxs/prior Treatment) Patient is a 80 y.o. male presenting with general illness. The history is provided by the patient.  Illness Location:  Throat Quality:  Nausea Severity:  Moderate Onset quality:  Sudden Timing:  Constant Progression:  Unchanged Chronicity:  New Context:  Woke up with spontaneous nausea Relieved by:  Nothing Worsened by:  Nothing Associated symptoms: nausea   Associated symptoms: no abdominal pain, no cough, no fever, no shortness of breath and no vomiting     Past Medical History  Diagnosis Date  . CAD (coronary artery disease)   . Hypertension   . Chronic kidney disease     chronic, stage II  . Hyperlipidemia   . PVD (peripheral vascular disease)   . Postural lightheadedness   . Memory loss   . Dysphagia   . Esophageal stricture     Dr [name]  . CRI (chronic renal insufficiency)   . Sensorineural hearing loss, bilateral   . Carotid stenosis, bilateral     right 60-79% stenosed.  Left 40-59%  . Adenomatous colon polyp   . Diverticulosis   . Unspecified constipation 12/29/2012  . BPH (benign prostatic hyperplasia) 07/14/2013  . WGYKZLDJ(570.1) 12/01/2013   Past Surgical History  Procedure Laterality Date  . Hernia repair  03/23/05    left inguinal Lichtenstein repair, with mesh.  [name]  . Abdominal aortic aneurysm repair  1996    Dr [name]  . Renal artery stent  1996    Dr [name].  Bilateral stenting  . Coronary artery bypass graft     Family History  Problem Relation Age of Onset  . Heart disease Father     CAD  . Cancer Neg Hx   . Diabetes Neg Hx   . Coronary artery disease Father    History  Substance Use Topics  . Smoking status: Former Research scientist (life sciences)  . Smokeless tobacco: Never  Used     Comment: quit 1980  . Alcohol Use: Yes    Review of Systems  Constitutional: Negative for fever and chills.  Respiratory: Negative for cough and shortness of breath.   Gastrointestinal: Positive for nausea. Negative for vomiting and abdominal pain.  All other systems reviewed and are negative.     Allergies  Review of patient's allergies indicates no known allergies.  Home Medications   Prior to Admission medications   Medication Sig Start Date End Date Taking? Authorizing Provider  aspirin EC 81 MG EC tablet Take 1 tablet (81 mg total) by mouth daily. 02/21/11   [name] Shawna Orleans, DO  atorvastatin (LIPITOR) 20 MG tablet Take 1 tablet (20 mg total) by mouth daily. 08/17/14   [name], MD  finasteride (PROSCAR) 5 MG tablet Take 1 tablet (5 mg total) by mouth daily. 01/27/14   [name], MD  losartan (COZAAR) 50 MG tablet Take 1 tablet (50 mg total) by mouth daily. 01/23/14   [name], MD  metoprolol succinate (TOPROL-XL) 25 MG 24 hr tablet Take 1 tablet (25 mg total) by mouth daily. 01/23/14   [name], MD  pantoprazole (PROTONIX) 40 MG tablet Take 1 tablet (40 mg total) by mouth daily. 01/23/14   [name], MD   BP 184/67  Pulse 88  Temp(Src) 98.5 F (36.9 C) (Oral)  Resp 18  Ht 5\' 10"  (1.778 m)  Wt 185 lb (83.915 kg)  BMI 26.54 kg/m2  SpO2 96% Physical Exam  Nursing note and vitals reviewed. Constitutional: He is oriented to person, place, and time. He appears well-developed and well-nourished. No distress.  HENT:  Head: Normocephalic and atraumatic.  Mouth/Throat: No oropharyngeal exudate.  Eyes: EOM are normal. Pupils are equal, round, and reactive to light.  Neck: Normal range of motion. Neck supple.  Cardiovascular: Normal rate and regular rhythm.  Exam reveals no friction rub.   No murmur heard. Pulmonary/Chest: Effort normal and breath sounds normal. No respiratory distress. He has no wheezes. He has no rales.  Abdominal: He exhibits  no distension. There is tenderness (RUQ, epigastric). There is no rebound.  Musculoskeletal: Normal range of motion. He exhibits no edema.  Neurological: He is alert and oriented to person, place, and time.  Skin: He is not diaphoretic.    ED Course  Procedures (including critical care time) Labs Review Labs Reviewed  CBC WITH DIFFERENTIAL  COMPREHENSIVE METABOLIC PANEL  LIPASE, BLOOD  TROPONIN I  CBC  DIFFERENTIAL    Imaging Review No results found.   EKG Interpretation   Date/Time:  Thursday August 20 2014 07:08:11 EDT Ventricular Rate:  75 PR Interval:  188 QRS Duration: 86 QT Interval:  370 QTC Calculation: 413 R Axis:   -36 Text Interpretation:  Normal sinus rhythm Left axis deviation Pulmonary  disease pattern Similar to prior Confirmed by Mingo Amber  MD, [place] ([id number]) on  08/20/2014 7:09:32 AM      MDM   Final diagnoses:  Nausea  Community acquired pneumonia    75M here with nausea. Began about 3.5-4 hrs ago. Hx of CAD with CABG, PVD, CKD. Patient denies abdominal pain, chest pain. He points to his throat when he describes nausea. Denies any fevers, actual vomiting, diarrhea, trouble with urination.  AFVSS here. On exam, clear lungs, normal heart sounds. Epigastric and RUQ pain on palpation. Will obtain labs including troponin, RUQ [place], CXR. CXR shows RLL PNA. RUQ [place] normal. Given Rocephin/Azithromycin, admitted to [facility].  Evelina Bucy, MD 08/20/14 [phone]

## 2014-08-20 NOTE — ED Notes (Signed)
Pt c/o nausea since 0330 this am, no pain in certain location, c/o "shaking all over"

## 2014-08-20 NOTE — ED Notes (Signed)
Patient transported to Ultrasound 

## 2014-08-20 NOTE — ED Notes (Signed)
C/o nausea and shaking all over  Onset this am

## 2014-08-20 NOTE — Progress Notes (Signed)
Pt. Admitted to 1515. Assessment stable, alert and oriented x4. Family at the bedside.

## 2014-08-20 NOTE — H&P (Signed)
Patient Demographics  Hardie Veltre, is a 80 y.o. male  MRN: [id number]   DOB - 1928-04-25  Admit Date - 08/20/2014  Outpatient Primary MD for the patient is Penni Homans, MD   With History of -  Past Medical History  Diagnosis Date  . CAD (coronary artery disease)   . Hypertension   . Chronic kidney disease     chronic, stage II  . Hyperlipidemia   . PVD (peripheral vascular disease)   . Postural lightheadedness   . Memory loss   . Dysphagia   . Esophageal stricture     Dr [name]  . CRI (chronic renal insufficiency)   . Sensorineural hearing loss, bilateral   . Carotid stenosis, bilateral     right 60-79% stenosed.  Left 40-59%  . Adenomatous colon polyp   . Diverticulosis   . Unspecified constipation 12/29/2012  . BPH (benign prostatic hyperplasia) 07/14/2013  . ZGYFVCBS(496.7) 12/01/2013      Past Surgical History  Procedure Laterality Date  . Hernia repair  03/23/05    left inguinal Lichtenstein repair, with mesh.  [name]  . Abdominal aortic aneurysm repair  1996    Dr [name]  . Renal artery stent  1996    Dr [name].  Bilateral stenting  . Coronary artery bypass graft      in for   Chief Complaint  Patient presents with  . Nausea     HPI  Kota Ciancio  is a 80 y.o. male, presents with complaints of nausea for the last few hours, chills, patient has no other complaints, basic workup in ED did show significant leukocytosis at 19,000, his x-ray did show right lower lobe infiltrate, patient denies any dysphagia symptoms, reports some mild cough with minimal productive sputum, but nothing significant, denies any shortness of breath, chest pain, fever, dysuria, abdomen or pain, diarrhea. Patient had blood cultures sent, started on IV Rocephin and azithromycin for community-acquired pneumonia, and transferred from [facility] to [place] [facility] for treatment of his pneumonia. Given patient nausea he had abdomen ultrasound done which did not show any acute  findings, and abnormal LFTs, normal lipase     Review of Systems    In addition to the HPI above, No Fever, but reports chills, No Headache, No changes with Vision or hearing, No problems swallowing food or Liquids, No Chest pain, no Cough, no Shortness of Breath, No Abdominal pain,  or Vommitting, Bowel movements are regular, plain soft mild Nausea. No Blood in stool or Urine, No dysuria, No new skin rashes or bruises, No new joints pains-aches,  No new weakness, tingling, numbness in any extremity, No recent weight gain or loss, No polyuria, polydypsia or polyphagia, No significant Mental Stressors.  A full 10 point Review of Systems was done, except as stated above, all other Review of Systems were negative.   Social History History  Substance Use Topics  . Smoking status: Former Research scientist (life sciences)  . Smokeless tobacco: Never Used     Comment: quit 1980  . Alcohol Use: 0.6 oz/week    1 Glasses of wine per week     Family History Family History  Problem Relation Age of Onset  . Heart disease Father     CAD  . Cancer Neg Hx   . Diabetes Neg Hx   . Coronary artery disease Father      Prior to Admission medications   Medication Sig Start Date End Date Taking? Authorizing Provider  aspirin EC 81  MG EC tablet Take 81 mg by mouth at bedtime.  02/21/11  Yes [name] Shawna Orleans, DO  finasteride (PROSCAR) 5 MG tablet Take 1 tablet (5 mg total) by mouth daily. 01/27/14  Yes [name], MD  losartan (COZAAR) 50 MG tablet Take 1 tablet (50 mg total) by mouth daily. 01/23/14  Yes [name], MD  metoprolol succinate (TOPROL-XL) 25 MG 24 hr tablet Take 25 mg by mouth daily with supper.   Yes Historical Provider, MD  pantoprazole (PROTONIX) 40 MG tablet Take 40 mg by mouth daily before breakfast. 01/23/14  Yes [name], MD  rosuvastatin (CRESTOR) 20 MG tablet Take 20 mg by mouth daily.   Yes Historical Provider, MD    No Known Allergies  Physical Exam  Vitals  Blood pressure  123/64, pulse 66, temperature 97.5 F (36.4 C), temperature source Oral, resp. rate 18, height 5\' 10"  (1.778 m), weight 83.915 kg (185 lb), SpO2 99.00%.   1. General elderly male lying in bed in NAD,    2. Normal affect and insight, Not Suicidal or Homicidal, Awake Alert, Oriented X 3.  3. No F.N deficits, ALL C.Nerves Intact, Strength 5/5 all 4 extremities, Sensation intact all 4 extremities, Plantars down going.  4. Ears and Eyes appear Normal, Conjunctivae clear, PERRLA. Moist Oral Mucosa.  5. Supple Neck, No JVD, No cervical lymphadenopathy appriciated, No Carotid Bruits.  6. Symmetrical Chest wall movement, Good air movement bilaterally, CTAB.  7. RRR, No Gallops, Rubs or Murmurs, No Parasternal Heave.  8. Positive Bowel Sounds, Abdomen Soft, No tenderness, No organomegaly appriciated,No rebound -guarding or rigidity.  9.  No Cyanosis, Normal Skin Turgor, No Skin Rash or Bruise.  10. Good muscle tone,  joints appear normal , no effusions, Normal ROM.  11. No Palpable Lymph Nodes in Neck or Axillae   Data Review  CBC  Recent Labs Lab 08/14/14 1100 08/20/14 0700  WBC 9.1 19.6*  HGB 14.4 15.0  HCT 44.1 44.2  PLT 201.0 193  MCV 90.1 88.9  MCH  --  30.2  MCHC 32.6 33.9  RDW 14.8 14.1  LYMPHSABS  --  1.9  MONOABS  --  0.7  EOSABS  --  0.2  BASOSABS  --  0.0   ------------------------------------------------------------------------------------------------------------------  Chemistries   Recent Labs Lab 08/14/14 1100 08/20/14 0700  NA 138 139  K 4.3 4.0  CL 107 100  CO2 25 21  GLUCOSE 105* 180*  BUN 21 27*  CREATININE 1.3 1.40*  CALCIUM 9.0 9.4  AST 24 22  ALT 20 16  ALKPHOS 40 49  BILITOT 0.6 0.5   ------------------------------------------------------------------------------------------------------------------ estimated creatinine clearance is 40.6 ml/min (by C-G formula based on Cr of  1.4). ------------------------------------------------------------------------------------------------------------------ No results found for this basename: TSH, T4TOTAL, FREET3, T3FREE, THYROIDAB,  in the last 72 hours   Coagulation profile No results found for this basename: INR, PROTIME,  in the last 168 hours ------------------------------------------------------------------------------------------------------------------- No results found for this basename: DDIMER,  in the last 72 hours -------------------------------------------------------------------------------------------------------------------  Cardiac Enzymes  Recent Labs Lab 08/20/14 0700  TROPONINI <0.30   ------------------------------------------------------------------------------------------------------------------ No components found with this basename: POCBNP,    ---------------------------------------------------------------------------------------------------------------  Urinalysis    Component Value Date/Time   COLORURINE YELLOW 08/20/2014 [place] 08/20/2014 1056   LABSPEC 1.016 08/20/2014 1056   PHURINE 5.5 08/20/2014 [place] 08/20/2014 [place] 08/20/2014 1056   HGBUR small 07/04/2010 [place] 08/20/2014 1056  KETONESUR NEGATIVE 08/20/2014 [place] 08/20/2014 1056   UROBILINOGEN 0.2 08/20/2014 1056   NITRITE NEGATIVE 08/20/2014 1056   LEUKOCYTESUR NEGATIVE 08/20/2014 1056    ----------------------------------------------------------------------------------------------------------------  Imaging results:   Dg Chest 2 View  08/20/2014   CLINICAL DATA:  Nausea and tremors beginning this morning.  EXAM: CHEST  2 VIEW  COMPARISON:  PA and lateral chest 03/21/2005.  FINDINGS: The patient is rotated to the left. There is airspace disease in the right lung base. The left lung is clear. Heart size is normal. The  patient is status post CABG. Hiatal hernia is noted.  IMPRESSION: Right basilar airspace disease could be due to atelectasis or pneumonia.  Hiatal hernia.   Electronically Signed   By: Inge Rise M.D.   On: 08/20/2014 07:31   US Abdomen Complete  08/20/2014   CLINICAL DATA:  Nausea.  EXAM: ULTRASOUND ABDOMEN COMPLETE  COMPARISON:  Abdominal radiograph dated 08/23/2011 and ultrasound dated 07/25/2006  FINDINGS: Gallbladder: No gallstones or wall thickening visualized. No sonographic Murphy sign noted.  Common bile duct: Diameter: 3 mm, normal.  Liver: No focal lesion identified. Within normal limits in parenchymal echogenicity.  IVC: No abnormality visualized.  Pancreas: Obscured by bowel.  Spleen: Not visualized.  Right Kidney: Length: 11.3 cm. Echogenicity within normal limits. No mass or hydronephrosis visualized.  Left Kidney: Length: 10.0 cm. Echogenicity within normal limits. No mass or hydronephrosis visualized.  Abdominal aorta: Partially obscured. 1.5 cm maximum diameter the visualized segment.  Other findings: Limited exam due to extensive bowel gas.  IMPRESSION: No acute abnormalities.  The pancreas is obscured by bowel.   Electronically Signed   By: Rozetta Nunnery M.D.   On: 08/20/2014 08:38      Assessment & Plan  Principal Problem:   CAP (community acquired pneumonia) Active Problems:   Hyperlipidemia   Essential hypertension   Coronary atherosclerosis   CHRONIC KIDNEY DISEASE STAGE II (MILD)   BPH (benign prostatic hyperplasia)    community-acquired pneumonia -Patient will be continued on IV Rocephin and azithromycin, follow on blood cultures, on oxygen when necessary, albuterol when necessary.  Hypertension -Blood pressure is acceptable, continue with home medication  Coronary atherosclerosis -Denies any chest pain, shortness of breath, continue with aspirin, statin, beta blockers, losartan.  BPH - Continue with Proscar  Hyperlipidemia -Continue with  statin.  Chronic kidney disease -Has a slight increase in his troponin, has some signs of clinical dehydration, will continue with gentle hydration, monitor closely.  DVT Prophylaxis Heparin -  - SCDs   AM Labs Ordered, also please review Full Orders  Family Communication: Admission, patients condition and plan of care including tests being ordered have been discussed with the patient and family who indicate understanding and agree with the plan and Code Status.  Code Status FULL  Likely DC to  Home  Condition GUARDED    Time spent in minutes : 55 minutes    Fiza Nation M.D on 08/20/2014 at 4:32 PM  Between 7am to 7pm - Pager - [phone]  After 7pm go to [email or web address] - password [email or web address]  And look for the night coverage person covering me after hours  [facility] Office  [phone]   **Disclaimer: This note may have been dictated with voice recognition software. Similar sounding words can inadvertently be transcribed and this note may contain transcription errors which may not have been corrected upon publication of note.**

## 2014-08-20 NOTE — ED Notes (Signed)
Patient returned from Ultrasound. 

## 2014-08-20 NOTE — ED Notes (Signed)
Spoke with Marden Noble, [facility], given pt weight and need iv pump.

## 2014-08-21 DIAGNOSIS — I10 Essential (primary) hypertension: Secondary | ICD-10-CM

## 2014-08-21 DIAGNOSIS — N183 Chronic kidney disease, stage 3 unspecified: Secondary | ICD-10-CM

## 2014-08-21 DIAGNOSIS — R110 Nausea: Secondary | ICD-10-CM

## 2014-08-21 LAB — CBC
HCT: 42.1 % (ref 39.0–52.0)
Hemoglobin: 14.1 g/dL (ref 13.0–17.0)
MCH: 30 pg (ref 26.0–34.0)
MCHC: 33.5 g/dL (ref 30.0–36.0)
MCV: 89.6 fL (ref 78.0–100.0)
PLATELETS: 155 10*3/uL (ref 150–400)
RBC: 4.7 MIL/uL (ref 4.22–5.81)
RDW: 14.1 % (ref 11.5–15.5)
WBC: 21.1 10*3/uL — ABNORMAL HIGH (ref 4.0–10.5)

## 2014-08-21 LAB — BASIC METABOLIC PANEL
ANION GAP: 12 (ref 5–15)
BUN: 24 mg/dL — ABNORMAL HIGH (ref 6–23)
CALCIUM: 9.1 mg/dL (ref 8.4–10.5)
CO2: 23 meq/L (ref 19–32)
Chloride: 102 mEq/L (ref 96–112)
Creatinine, Ser: 1.25 mg/dL (ref 0.50–1.35)
GFR calc Af Amer: 59 mL/min — ABNORMAL LOW (ref >90)
GFR, EST NON AFRICAN AMERICAN: 51 mL/min — AB (ref >90)
Glucose, Bld: 99 mg/dL (ref 70–99)
Potassium: 4.1 mEq/L (ref 3.7–5.3)
SODIUM: 137 meq/L (ref 137–147)

## 2014-08-21 NOTE — Care Management Note (Signed)
    Page 1 of 1   08/21/2014     11:05:53 AM CARE MANAGEMENT NOTE 08/21/2014  Patient:  Troy Ray, Troy Ray   Account Number:  [id number]  Date Initiated:  08/21/2014  Documentation initiated by:  Sunday Spillers  Subjective/Objective Assessment:   80 yo male admitted with PNA. PTA lived at home.     Action/Plan:   Home when stable   Anticipated DC Date:  08/24/2014   Anticipated DC Plan:  [place]  CM consult      Choice offered to / List presented to:             Status of service:  Completed, signed off Medicare Important Message given?   (If response is "NO", the following Medicare IM given date fields will be blank) Date Medicare IM given:   Medicare IM given by:   Date Additional Medicare IM given:   Additional Medicare IM given by:    Discharge Disposition:  HOME/SELF CARE  Per UR Regulation:  Reviewed for med. necessity/level of care/duration of stay  If discussed at [place] of Stay Meetings, dates discussed:    Comments:

## 2014-08-21 NOTE — Progress Notes (Signed)
PROGRESS NOTE  Troy Ray TKP:[id number] DOB: 25-May-1928 DOA: 08/20/2014 PCP: Penni Homans, MD Brief history 80 year old male with a history of hypertension, coronary artery disease, CKD stage III, and carotid stenosis presented with one-day history of subjective chills, nausea, dry heaving, and nonproductive cough. The patient had been in his usual state of health prior to the admission. He denied any hemoptysis, hematemesis, hematochezia, melena. He has not been on any antibiotics in the last 3 months. Evaluation in the emergency department revealed WBC 29.1. with a serum creatinine 1.40. Chest x-ray revealed right lower lobe infiltrate.  Abdominal ultrasound was within normal limits. Urinalysis did not showing pyuria Assessment/Plan: Community acquired pneumonia - CURB 65 score= 2 prompting admission -Continue intravenous ceftriaxone and azithromycin -Urine Legionella antigen, urine Streptococcus pneumonia antigen -Pulmonary hygiene Hypertension -Continue metoprolol succinate and losartan Coronary artery disease -Continue aspirin 81 mg daily CKD stage III -Stable -Baseline creatinine 1.2-1.5 Hyperlipidemia -Continue statin  Family Communication:   Wife updated at beside Disposition Plan:   Home when medically stable   Antibiotics:  Ceftriaxone 08/20/2014>>>  Azithromycin 08/20/2014>>>      Procedures/Studies: Dg Chest 2 View  08/20/2014   CLINICAL DATA:  Nausea and tremors beginning this morning.  EXAM: CHEST  2 VIEW  COMPARISON:  PA and lateral chest 03/21/2005.  FINDINGS: The patient is rotated to the left. There is airspace disease in the right lung base. The left lung is clear. Heart size is normal. The patient is status post CABG. Hiatal hernia is noted.  IMPRESSION: Right basilar airspace disease could be due to atelectasis or pneumonia.  Hiatal hernia.   Electronically Signed   By: Inge Rise M.D.   On: 08/20/2014 07:31   US Abdomen  Complete  08/20/2014   CLINICAL DATA:  Nausea.  EXAM: ULTRASOUND ABDOMEN COMPLETE  COMPARISON:  Abdominal radiograph dated 08/23/2011 and ultrasound dated 07/25/2006  FINDINGS: Gallbladder: No gallstones or wall thickening visualized. No sonographic Murphy sign noted.  Common bile duct: Diameter: 3 mm, normal.  Liver: No focal lesion identified. Within normal limits in parenchymal echogenicity.  IVC: No abnormality visualized.  Pancreas: Obscured by bowel.  Spleen: Not visualized.  Right Kidney: Length: 11.3 cm. Echogenicity within normal limits. No mass or hydronephrosis visualized.  Left Kidney: Length: 10.0 cm. Echogenicity within normal limits. No mass or hydronephrosis visualized.  Abdominal aorta: Partially obscured. 1.5 cm maximum diameter the visualized segment.  Other findings: Limited exam due to extensive bowel gas.  IMPRESSION: No acute abnormalities.  The pancreas is obscured by bowel.   Electronically Signed   By: Rozetta Nunnery M.D.   On: 08/20/2014 08:38         Subjective: Patient denies fevers, chills, headache, chest pain, dyspnea, nausea, vomiting, diarrhea, abdominal pain, dysuria, hematuria   Objective: Filed Vitals:   08/20/14 1452 08/20/14 2116 08/21/14 0615 08/21/14 1344  BP: 123/64 160/65 180/64 182/68  Pulse: 66 61 61 69  Temp: 97.5 F (36.4 C) 98.4 F (36.9 C) 98.1 F (36.7 C) 98.5 F (36.9 C)  TempSrc: Oral Oral Oral Oral  Resp: 18 18 20 18   Height:      Weight:      SpO2: 99% 100% 98% 100%    Intake/Output Summary (Last 24 hours) at 08/21/14 1803 Last data filed at 08/21/14 1600  Gross per 24 hour  Intake   2090 ml  Output      0 ml  Net   2090  ml   Weight change:  Exam:   General:  Pt is alert, follows commands appropriately, not in acute distress  HEENT: No icterus, No thrush, [place]/AT  Cardiovascular: RRR, S1/S2, no rubs, no gallops  Respiratory: Bibasilar crackles. No wheezing. Good air movement.   Abdomen: Soft/+BS, non tender, non  distended, no guarding  Extremities: No edema, No lymphangitis, No petechiae, No rashes, no synovitis  Data Reviewed: Basic Metabolic Panel:  Recent Labs Lab 08/20/14 0700 08/20/14 1612 08/21/14 0520  NA 139  --  137  K 4.0  --  4.1  CL 100  --  102  CO2 21  --  23  GLUCOSE 180*  --  99  BUN 27*  --  24*  CREATININE 1.40* 1.31 1.25  CALCIUM 9.4  --  9.1   Liver Function Tests:  Recent Labs Lab 08/20/14 0700  AST 22  ALT 16  ALKPHOS 49  BILITOT 0.5  PROT 7.2  ALBUMIN 3.8    Recent Labs Lab 08/20/14 0700  LIPASE 37   No results found for this basename: AMMONIA,  in the last 168 hours CBC:  Recent Labs Lab 08/20/14 0700 08/20/14 1612 08/21/14 0520  WBC 19.6* 29.1* 21.1*  NEUTROABS 16.8*  --   --   HGB 15.0 14.3 14.1  HCT 44.2 43.0 42.1  MCV 88.9 89.8 89.6  PLT 193 170 155   Cardiac Enzymes:  Recent Labs Lab 08/20/14 0700  TROPONINI <0.30   BNP: No components found with this basename: POCBNP,  CBG: No results found for this basename: GLUCAP,  in the last 168 hours  Recent Results (from the past 240 hour(s))  CULTURE, BLOOD (ROUTINE X 2)     Status: None   Collection Time    08/20/14  8:45 AM      Result Value Ref Range Status   Specimen Description BLOOD RT ARM   Final   Special Requests Normal BOTTLES DRAWN AEROBIC AND ANAEROBIC 5CC   Final   Culture  Setup Time     Final   Value: 08/20/2014 14:15     Performed at [facility]   Culture     Final   Value:        BLOOD CULTURE RECEIVED NO GROWTH TO DATE CULTURE WILL BE HELD FOR 5 DAYS BEFORE ISSUING A FINAL NEGATIVE REPORT     Performed at [facility]   Report Status PENDING   Incomplete  CULTURE, BLOOD (ROUTINE X 2)     Status: None   Collection Time    08/20/14  8:45 AM      Result Value Ref Range Status   Specimen Description BLOOD RT HAND   Final   Special Requests Normal BOTTLES DRAWN AEROBIC AND ANAEROBIC 5CC   Final   Culture  Setup Time     Final   Value:  08/20/2014 14:15     Performed at [facility]   Culture     Final   Value:        BLOOD CULTURE RECEIVED NO GROWTH TO DATE CULTURE WILL BE HELD FOR 5 DAYS BEFORE ISSUING A FINAL NEGATIVE REPORT     Performed at [facility]   Report Status PENDING   Incomplete     Scheduled Meds: . aspirin EC  81 mg Oral QHS  . azithromycin  500 mg Intravenous Q24H  . cefTRIAXone (ROCEPHIN)  IV  1 g Intravenous Q24H  . finasteride  5 mg Oral Daily  .  heparin  5,000 Units Subcutaneous 3 times per day  . losartan  50 mg Oral Daily  . metoprolol succinate  25 mg Oral Q supper  . pantoprazole  40 mg Oral QAC breakfast  . rosuvastatin  20 mg Oral q1800   Continuous Infusions: . sodium chloride 50 mL/hr at 08/20/14 1641     Lubertha Leite, DO  [facility] Pager [phone]  If 7PM-7AM, please contact night-coverage [email or web address] Password [email or web address] 08/21/2014, 6:03 PM   LOS: 1 day

## 2014-08-22 LAB — CBC
HCT: 44.8 % (ref 39.0–52.0)
HEMOGLOBIN: 15.1 g/dL (ref 13.0–17.0)
MCH: 30.4 pg (ref 26.0–34.0)
MCHC: 33.7 g/dL (ref 30.0–36.0)
MCV: 90.1 fL (ref 78.0–100.0)
PLATELETS: 172 10*3/uL (ref 150–400)
RBC: 4.97 MIL/uL (ref 4.22–5.81)
RDW: 14 % (ref 11.5–15.5)
WBC: 16.5 10*3/uL — ABNORMAL HIGH (ref 4.0–10.5)

## 2014-08-22 LAB — BASIC METABOLIC PANEL
ANION GAP: 15 (ref 5–15)
BUN: 19 mg/dL (ref 6–23)
CO2: 20 meq/L (ref 19–32)
Calcium: 9.2 mg/dL (ref 8.4–10.5)
Chloride: 102 mEq/L (ref 96–112)
Creatinine, Ser: 1.12 mg/dL (ref 0.50–1.35)
GFR calc Af Amer: 68 mL/min — ABNORMAL LOW (ref >90)
GFR calc non Af Amer: 58 mL/min — ABNORMAL LOW (ref >90)
Glucose, Bld: 115 mg/dL — ABNORMAL HIGH (ref 70–99)
Potassium: 4.2 mEq/L (ref 3.7–5.3)
SODIUM: 137 meq/L (ref 137–147)

## 2014-08-22 MED ORDER — CEFPODOXIME PROXETIL 200 MG PO TABS: 200.0000 mg | ORAL_TABLET | Freq: Two times a day (BID) | ORAL | Status: DC

## 2014-08-22 MED ORDER — AZITHROMYCIN 500 MG PO TABS: 500.0000 mg | ORAL_TABLET | Freq: Every day | ORAL | Status: DC

## 2014-08-22 NOTE — Discharge Summary (Signed)
Physician Discharge Summary  WINFREY CHILLEMI YQM:[id number] DOB: 29-Dec-1927 DOA: 08/20/2014  PCP: Penni Homans, MD  Admit date: 08/20/2014 Discharge date: 08/22/2014  Recommendations for Outpatient Follow-up:  1. Pt will need to follow up with PCP in 2 weeks post discharge 2. Please obtain BMP and CBC in one week   Discharge Diagnoses:  Community acquired pneumonia  - CURB 65 score= 2 prompting admission  -Continued intravenous ceftriaxone and azithromycin  -patient showed significant clinical improvement in 48 hours of hospitalization -WBC improved--16.5 on the day of d/c -The patient will go home with cefpodoxime and azithromycin for 4 additional days which will complete 7 days of therapy -Pulmonary hygiene  Hypertension  -Continue metoprolol succinate and losartan  -BP remained high  -Followup with primary care provider for further adjustment of antihypertensive regimen if clinically indicated  Coronary artery disease  -Continue aspirin 81 mg daily  CKD stage III  -Stable  -Baseline creatinine 1.2-1.5  -Serum creatinine 1.12 on the day of discharge Hyperlipidemia  -Continue statin  Family Communication: Wife updated at beside  Disposition Plan: Home  Antibiotics:  Ceftriaxone 08/20/2014>>>  Azithromycin 08/20/2014>>>   Discharge Condition: stable  Disposition: home  Diet:heart healthy Wt Readings from Last 3 Encounters:  08/20/14 83.915 kg (185 lb)  08/13/14 83.462 kg (184 lb)  06/03/14 80.74 kg (178 lb)    History of present illness:  80 year old male with a history of hypertension, coronary artery disease, CKD stage III, and carotid stenosis presented with one-day history of subjective chills, nausea, dry heaving, and nonproductive cough. The patient had been in his usual state of health prior to the admission. He denied any hemoptysis, hematemesis, hematochezia, melena. He has not been on any antibiotics in the last 3 months. Evaluation in the emergency  department revealed WBC 29.1 with a serum creatinine 1.40. Chest x-ray revealed right lower lobe infiltrate. Abdominal ultrasound was within normal limits. Urinalysis did not showing pyuria. The patient was started on IV fluids and intravenous antibiotics. The patient showed significant clinical improvement within 24-48 hours of intravenous antibiotics. His WBC count improved and was 16.5 on the day of discharge.    Discharge Exam: Filed Vitals:   08/22/14 0536  BP: 197/78  Pulse: 66  Temp: 97.9 F (36.6 C)  Resp: 18   Filed Vitals:   08/21/14 0615 08/21/14 1344 08/21/14 2117 08/22/14 0536  BP: 180/64 182/68 193/88 197/78  Pulse: 61 69 75 66  Temp: 98.1 F (36.7 C) 98.5 F (36.9 C) 98 F (36.7 C) 97.9 F (36.6 C)  TempSrc: Oral Oral Oral Oral  Resp: 20 18 18 18   Height:      Weight:      SpO2: 98% 100% 99% 100%   General: A&O x 3, NAD, pleasant, cooperative Cardiovascular: RRR, no rub, no gallop, no S3 Respiratory: R-basilar rales. No wheezing. Good air movement.  Abdomen:soft, nontender, nondistended, positive bowel sounds Extremities: No edema, No lymphangitis, no petechiae  Discharge Instructions      Discharge Instructions   Diet - low sodium heart healthy    Complete by:  As directed      Increase activity slowly    Complete by:  As directed             Medication List         aspirin EC 81 MG tablet  Take 81 mg by mouth at bedtime.     azithromycin 500 MG tablet  Commonly known as:  ZITHROMAX  Take 1 tablet (500  mg total) by mouth daily. Start 08/23/14  Start taking on:  08/23/2014     cefpodoxime 200 MG tablet  Commonly known as:  VANTIN  Take 1 tablet (200 mg total) by mouth 2 (two) times daily.     finasteride 5 MG tablet  Commonly known as:  PROSCAR  Take 1 tablet (5 mg total) by mouth daily.     losartan 50 MG tablet  Commonly known as:  COZAAR  Take 1 tablet (50 mg total) by mouth daily.     metoprolol succinate 25 MG 24 hr tablet    Commonly known as:  TOPROL-XL  Take 25 mg by mouth daily with supper.     pantoprazole 40 MG tablet  Commonly known as:  PROTONIX  Take 40 mg by mouth daily before breakfast.     rosuvastatin 20 MG tablet  Commonly known as:  CRESTOR  Take 20 mg by mouth daily.         The results of significant diagnostics from this hospitalization (including imaging, microbiology, ancillary and laboratory) are listed below for reference.    Significant Diagnostic Studies: Dg Chest 2 View  08/20/2014   CLINICAL DATA:  Nausea and tremors beginning this morning.  EXAM: CHEST  2 VIEW  COMPARISON:  PA and lateral chest 03/21/2005.  FINDINGS: The patient is rotated to the left. There is airspace disease in the right lung base. The left lung is clear. Heart size is normal. The patient is status post CABG. Hiatal hernia is noted.  IMPRESSION: Right basilar airspace disease could be due to atelectasis or pneumonia.  Hiatal hernia.   Electronically Signed   By: Inge Rise M.D.   On: 08/20/2014 07:31   US Abdomen Complete  08/20/2014   CLINICAL DATA:  Nausea.  EXAM: ULTRASOUND ABDOMEN COMPLETE  COMPARISON:  Abdominal radiograph dated 08/23/2011 and ultrasound dated 07/25/2006  FINDINGS: Gallbladder: No gallstones or wall thickening visualized. No sonographic Murphy sign noted.  Common bile duct: Diameter: 3 mm, normal.  Liver: No focal lesion identified. Within normal limits in parenchymal echogenicity.  IVC: No abnormality visualized.  Pancreas: Obscured by bowel.  Spleen: Not visualized.  Right Kidney: Length: 11.3 cm. Echogenicity within normal limits. No mass or hydronephrosis visualized.  Left Kidney: Length: 10.0 cm. Echogenicity within normal limits. No mass or hydronephrosis visualized.  Abdominal aorta: Partially obscured. 1.5 cm maximum diameter the visualized segment.  Other findings: Limited exam due to extensive bowel gas.  IMPRESSION: No acute abnormalities.  The pancreas is obscured by bowel.    Electronically Signed   By: Rozetta Nunnery M.D.   On: 08/20/2014 08:38     Microbiology: Recent Results (from the past 240 hour(s))  CULTURE, BLOOD (ROUTINE X 2)     Status: None   Collection Time    08/20/14  8:45 AM      Result Value Ref Range Status   Specimen Description BLOOD RT ARM   Final   Special Requests Normal BOTTLES DRAWN AEROBIC AND ANAEROBIC 5CC   Final   Culture  Setup Time     Final   Value: 08/20/2014 14:15     Performed at [facility]   Culture     Final   Value:        BLOOD CULTURE RECEIVED NO GROWTH TO DATE CULTURE WILL BE HELD FOR 5 DAYS BEFORE ISSUING A FINAL NEGATIVE REPORT     Performed at [facility]   Report Status PENDING   Incomplete  CULTURE, BLOOD (ROUTINE X 2)     Status: None   Collection Time    08/20/14  8:45 AM      Result Value Ref Range Status   Specimen Description BLOOD RT HAND   Final   Special Requests Normal BOTTLES DRAWN AEROBIC AND ANAEROBIC 5CC   Final   Culture  Setup Time     Final   Value: 08/20/2014 14:15     Performed at [facility]   Culture     Final   Value:        BLOOD CULTURE RECEIVED NO GROWTH TO DATE CULTURE WILL BE HELD FOR 5 DAYS BEFORE ISSUING A FINAL NEGATIVE REPORT     Performed at [facility]   Report Status PENDING   Incomplete     Labs: Basic Metabolic Panel:  Recent Labs Lab 08/20/14 0700 08/20/14 1612 08/21/14 0520 08/22/14 0550  NA 139  --  137 137  K 4.0  --  4.1 4.2  CL 100  --  102 102  CO2 21  --  23 20  GLUCOSE 180*  --  99 115*  BUN 27*  --  24* 19  CREATININE 1.40* 1.31 1.25 1.12  CALCIUM 9.4  --  9.1 9.2   Liver Function Tests:  Recent Labs Lab 08/20/14 0700  AST 22  ALT 16  ALKPHOS 49  BILITOT 0.5  PROT 7.2  ALBUMIN 3.8    Recent Labs Lab 08/20/14 0700  LIPASE 37   No results found for this basename: AMMONIA,  in the last 168 hours CBC:  Recent Labs Lab 08/20/14 0700 08/20/14 1612 08/21/14 0520 08/22/14 0550  WBC 19.6* 29.1*  21.1* 16.5*  NEUTROABS 16.8*  --   --   --   HGB 15.0 14.3 14.1 15.1  HCT 44.2 43.0 42.1 44.8  MCV 88.9 89.8 89.6 90.1  PLT 193 170 155 172   Cardiac Enzymes:  Recent Labs Lab 08/20/14 0700  TROPONINI <0.30   BNP: No components found with this basename: POCBNP,  CBG: No results found for this basename: GLUCAP,  in the last 168 hours  Time coordinating discharge:  Greater than 30 minutes  Signed:  Nga Rabon, DO [facility] Pager: [phone] 08/22/2014, 11:14 AM

## 2014-08-25 ENCOUNTER — Telehealth: Payer: Self-pay

## 2014-08-25 NOTE — Telephone Encounter (Signed)
Admit date: 08/20/2014  Discharge date: 08/22/2014  Recommendations for Outpatient Follow-up:  1. Pt will need to follow up with PCP in 2 weeks post discharge 2. Please obtain BMP and CBC in one week  TCM call completed by wife, who assists husband with his care.    Transition Care Management Follow-up Telephone Call  How have you been since you were released from the hospital?  Wife states that her husband is doing well.  Afebrile.  No shortness of breath, chest pain, or new/unusal symptoms.  She states that he has an increased cough (still nonproductive, no hemoptysis), but that it could be due to the in-home central heat.  Wife was encouraged to purchase a humidifier to add moisture to air.     Do you understand why you were in the hospital? yes     Do you understand the discharge instructions? yes  Items Reviewed:  Medications reviewed: yes, pt is taking antibiotics as prescribed.    Allergies reviewed: yes  Dietary changes reviewed: yes, low sodium heart healthy.  Wife states that pt is eating well and staying hydrated.    Referrals reviewed: n/a   Functional Questionnaire:   Activities of Daily Living (ADLs):   He states they are independent in the following: ambulation, bathing and hygiene, feeding, continence, grooming, toileting and dressing States they require assistance with the following: none  Husband has been increasing his activity level slowly without difficulty.  Husband has been feeling weak in his legs, but this is probably due to hospitalization/bedrest.     Any transportation issues/concerns?: no, wife has been providing transportation.  Husband has not felt like driving.     Any patient concerns? Not at this time   Confirmed importance and date/time of follow-up visits scheduled: yes   Confirmed with patient if condition begins to worsen call PCP or go to the ER.  Patient was given the Call-a-Nurse line [phone]: yes   Hospital follow up  appointment scheduled for October 27th at 11:15 am with Dr. Charlett Blake.

## 2014-08-26 LAB — CULTURE, BLOOD (ROUTINE X 2)
CULTURE: NO GROWTH
Culture: NO GROWTH
SPECIAL REQUESTS: NORMAL
Special Requests: NORMAL

## 2014-09-01 ENCOUNTER — Encounter: Payer: Self-pay | Admitting: Family Medicine

## 2014-09-01 ENCOUNTER — Ambulatory Visit (INDEPENDENT_AMBULATORY_CARE_PROVIDER_SITE_OTHER): Payer: Commercial Managed Care - HMO | Admitting: Family Medicine

## 2014-09-01 VITALS — BP 115/86 | HR 60 | Temp 97.6°F | Ht 70.0 in | Wt 179.6 lb

## 2014-09-01 DIAGNOSIS — J189 Pneumonia, unspecified organism: Secondary | ICD-10-CM

## 2014-09-01 DIAGNOSIS — E785 Hyperlipidemia, unspecified: Secondary | ICD-10-CM

## 2014-09-01 DIAGNOSIS — I10 Essential (primary) hypertension: Secondary | ICD-10-CM

## 2014-09-01 DIAGNOSIS — K5900 Constipation, unspecified: Secondary | ICD-10-CM

## 2014-09-01 DIAGNOSIS — K219 Gastro-esophageal reflux disease without esophagitis: Secondary | ICD-10-CM

## 2014-09-01 LAB — CBC
HEMATOCRIT: 46.9 % (ref 39.0–52.0)
HEMOGLOBIN: 15.4 g/dL (ref 13.0–17.0)
MCHC: 32.8 g/dL (ref 30.0–36.0)
MCV: 90.4 fl (ref 78.0–100.0)
Platelets: 285 10*3/uL (ref 150.0–400.0)
RBC: 5.19 Mil/uL (ref 4.22–5.81)
RDW: 14.3 % (ref 11.5–15.5)
WBC: 10 10*3/uL (ref 4.0–10.5)

## 2014-09-01 LAB — RENAL FUNCTION PANEL
ALBUMIN: 3.6 g/dL (ref 3.5–5.2)
BUN: 19 mg/dL (ref 6–23)
CALCIUM: 9.5 mg/dL (ref 8.4–10.5)
CO2: 17 meq/L — AB (ref 19–32)
Chloride: 107 mEq/L (ref 96–112)
Creatinine, Ser: 1.5 mg/dL (ref 0.4–1.5)
GFR: 48.08 mL/min — ABNORMAL LOW (ref >60.00)
GLUCOSE: 86 mg/dL (ref 70–99)
Phosphorus: 2.7 mg/dL (ref 2.3–4.6)
Potassium: 4.4 mEq/L (ref 3.5–5.1)
Sodium: 138 mEq/L (ref 135–145)

## 2014-09-01 MED ORDER — PANTOPRAZOLE SODIUM 40 MG PO TBEC: 40.0000 mg | DELAYED_RELEASE_TABLET | Freq: Every day | ORAL | Status: DC

## 2014-09-01 MED ORDER — LOSARTAN POTASSIUM 50 MG PO TABS: 50.0000 mg | ORAL_TABLET | Freq: Every day | ORAL | Status: DC

## 2014-09-01 NOTE — Progress Notes (Signed)
Pre visit review using our clinic review tool, if applicable. No additional management support is needed unless otherwise documented below in the visit note. 

## 2014-09-06 NOTE — Assessment & Plan Note (Signed)
Encouraged increased hydration and fiber in diet. Daily probiotics. If bowels not moving can use MOM 2 tbls po in 4 oz of warm prune juice by mouth every 2-3 days. If no results then repeat in 4 hours with  Dulcolax suppository pr, may repeat again in 4 more hours as needed. Seek care if symptoms worsen. Consider daily Miralax and/or Dulcolax if symptoms persist.  

## 2014-09-06 NOTE — Progress Notes (Signed)
Troy Ray [id number] 1928/07/09 09/06/2014      Progress Note-Follow Up  Subjective  Chief Complaint  Chief Complaint  Patient presents with  . Follow-up    hospital    HPI  Patient is a 80 year old male in today for routine medical care. recently diagnosed with pneumonia and struggling with fatigue, cough, congestion. No hi grade fevers but some chills or noted. Notes some nausea but no vomiting. Denies CP/palp/SOB/HA/GI or GU c/o. Taking meds as prescribed  Past Medical History  Diagnosis Date  . CAD (coronary artery disease)   . Hypertension   . Chronic kidney disease     chronic, stage II  . Hyperlipidemia   . PVD (peripheral vascular disease)   . Postural lightheadedness   . Memory loss   . Dysphagia   . Esophageal stricture     Dr [name]  . CRI (chronic renal insufficiency)   . Sensorineural hearing loss, bilateral   . Carotid stenosis, bilateral     right 60-79% stenosed.  Left 40-59%  . Adenomatous colon polyp   . Diverticulosis   . Unspecified constipation 12/29/2012  . BPH (benign prostatic hyperplasia) 07/14/2013  . ALPFXTKW(409.7) 12/01/2013    Past Surgical History  Procedure Laterality Date  . Hernia repair  03/23/05    left inguinal Lichtenstein repair, with mesh.  [name]  . Abdominal aortic aneurysm repair  1996    Dr [name]  . Renal artery stent  1996    Dr [name].  Bilateral stenting  . Coronary artery bypass graft      Family History  Problem Relation Age of Onset  . Heart disease Father     CAD  . Cancer Neg Hx   . Diabetes Neg Hx   . Coronary artery disease Father     History   Social History  . Marital Status: Married    Spouse Name: N/A    Number of Children: 3  . Years of Education: N/A   Occupational History  . MEDIA ASSISTANT    Social History Main Topics  . Smoking status: Former Research scientist (life sciences)  . Smokeless tobacco: Never Used     Comment: quit 1980  . Alcohol Use: 0.6 oz/week    1 Glasses of wine per week   . Drug Use: No  . Sexual Activity: Yes   Other Topics Concern  . Not on file   Social History Narrative  . No narrative on file    Current Outpatient Prescriptions on File Prior to Visit  Medication Sig Dispense Refill  . aspirin EC 81 MG EC tablet Take 81 mg by mouth at bedtime.     . cefpodoxime (VANTIN) 200 MG tablet Take 1 tablet (200 mg total) by mouth 2 (two) times daily. 9 tablet 0  . finasteride (PROSCAR) 5 MG tablet Take 1 tablet (5 mg total) by mouth daily. 90 tablet 1  . metoprolol succinate (TOPROL-XL) 25 MG 24 hr tablet Take 25 mg by mouth daily with supper.    . rosuvastatin (CRESTOR) 20 MG tablet Take 20 mg by mouth daily.     No current facility-administered medications on file prior to visit.    No Known Allergies  Review of Systems  Review of Systems  Constitutional: Positive for malaise/fatigue. Negative for fever.  HENT: Positive for congestion.   Eyes: Negative for discharge.  Respiratory: Positive for cough. Negative for shortness of breath.   Cardiovascular: Negative for chest pain, palpitations and leg swelling.  Gastrointestinal:  Negative for nausea, abdominal pain and diarrhea.  Genitourinary: Negative for dysuria.  Musculoskeletal: Negative for falls.  Skin: Negative for rash.  Neurological: Negative for loss of consciousness and headaches.  Endo/Heme/Allergies: Negative for polydipsia.  Psychiatric/Behavioral: Negative for depression and suicidal ideas. The patient is not nervous/anxious and does not have insomnia.     Objective  BP 115/86 mmHg  Pulse 60  Temp(Src) 97.6 F (36.4 C) (Oral)  Ht 5\' 10"  (1.778 m)  Wt 179 lb 9.6 oz (81.466 kg)  BMI 25.77 kg/m2  SpO2 100%  Physical Exam  Physical Exam  Constitutional: He is oriented to person, place, and time and well-developed, well-nourished, and in no distress. No distress.  HENT:  Head: Normocephalic and atraumatic.  Eyes: Conjunctivae are normal.  Neck: Neck supple. No thyromegaly  present.  Cardiovascular: Normal rate, regular rhythm and normal heart sounds.   Pulmonary/Chest: Effort normal and breath sounds normal. No respiratory distress.  Decreased breath sounds on right  Abdominal: He exhibits no distension and no mass. There is no tenderness.  Musculoskeletal: He exhibits no edema.  Neurological: He is alert and oriented to person, place, and time.  Skin: Skin is warm.  Psychiatric: Memory, affect and judgment normal.    Lab Results  Component Value Date   TSH 1.55 08/14/2014   Lab Results  Component Value Date   WBC 10.0 09/01/2014   HGB 15.4 09/01/2014   HCT 46.9 09/01/2014   MCV 90.4 09/01/2014   PLT 285.0 09/01/2014   Lab Results  Component Value Date   CREATININE 1.5 09/01/2014   BUN 19 09/01/2014   NA 138 09/01/2014   K 4.4 09/01/2014   CL 107 09/01/2014   CO2 17* 09/01/2014   Lab Results  Component Value Date   ALT 16 08/20/2014   AST 22 08/20/2014   ALKPHOS 49 08/20/2014   BILITOT 0.5 08/20/2014   Lab Results  Component Value Date   CHOL 130 08/14/2014   Lab Results  Component Value Date   HDL 33.60* 08/14/2014   Lab Results  Component Value Date   LDLCALC 73 08/14/2014   Lab Results  Component Value Date   TRIG 116.0 08/14/2014   Lab Results  Component Value Date   CHOLHDL 4 08/14/2014     Assessment & Plan  Essential hypertension Well controlled, no changes to meds. Encouraged heart healthy diet such as the DASH diet and exercise as tolerated.   Hyperlipidemia Tolerating statin, encouraged heart healthy diet, avoid trans fats, minimize simple carbs and saturated fats. Increase exercise as tolerated  Constipation Encouraged increased hydration and fiber in diet. Daily probiotics. If bowels not moving can use MOM 2 tbls po in 4 oz of warm prune juice by mouth every 2-3 days. If no results then repeat in 4 hours with  Dulcolax suppository pr, may repeat again in 4 more hours as needed. Seek care if symptoms  worsen. Consider daily Miralax and/or Dulcolax if symptoms persist.   Pneumonia Recently diagnosed, improving, encouraged probiotics and plenty of rest and fluids

## 2014-09-06 NOTE — Assessment & Plan Note (Signed)
Tolerating statin, encouraged heart healthy diet, avoid trans fats, minimize simple carbs and saturated fats. Increase exercise as tolerated 

## 2014-09-06 NOTE — Assessment & Plan Note (Signed)
Recently diagnosed, improving, encouraged probiotics and plenty of rest and fluids

## 2014-09-06 NOTE — Assessment & Plan Note (Signed)
Well controlled, no changes to meds. Encouraged heart healthy diet such as the DASH diet and exercise as tolerated.  °

## 2014-11-25 DIAGNOSIS — H4011X1 Primary open-angle glaucoma, mild stage: Secondary | ICD-10-CM | POA: Diagnosis not present

## 2014-12-03 ENCOUNTER — Other Ambulatory Visit: Payer: Self-pay | Admitting: Family Medicine

## 2014-12-03 DIAGNOSIS — K219 Gastro-esophageal reflux disease without esophagitis: Secondary | ICD-10-CM

## 2014-12-03 DIAGNOSIS — I10 Essential (primary) hypertension: Secondary | ICD-10-CM

## 2014-12-03 NOTE — Telephone Encounter (Signed)
Caller name:Emerick Louella Relation to [facility]:[place]BTYO Call back [facility]  Reason for call: pt is needing rx pantoprazole (PROTONIX) 40 MG tablet  And losartan (COZAAR) 50 MG tablet  90 day supply for both.

## 2014-12-04 MED ORDER — PANTOPRAZOLE SODIUM 40 MG PO TBEC: 40.0000 mg | DELAYED_RELEASE_TABLET | Freq: Every day | ORAL | Status: DC

## 2014-12-04 MED ORDER — LOSARTAN POTASSIUM 50 MG PO TABS: 50.0000 mg | ORAL_TABLET | Freq: Every day | ORAL | Status: DC

## 2014-12-04 NOTE — Telephone Encounter (Addendum)
Sent to pharmacy 

## 2014-12-23 ENCOUNTER — Other Ambulatory Visit (HOSPITAL_COMMUNITY): Payer: Self-pay | Admitting: Cardiology

## 2014-12-23 DIAGNOSIS — I6523 Occlusion and stenosis of bilateral carotid arteries: Secondary | ICD-10-CM

## 2014-12-28 DIAGNOSIS — H26492 Other secondary cataract, left eye: Secondary | ICD-10-CM | POA: Diagnosis not present

## 2014-12-28 DIAGNOSIS — H4011X2 Primary open-angle glaucoma, moderate stage: Secondary | ICD-10-CM | POA: Diagnosis not present

## 2014-12-31 ENCOUNTER — Encounter (HOSPITAL_COMMUNITY): Payer: Medicare HMO

## 2015-01-11 ENCOUNTER — Encounter (HOSPITAL_COMMUNITY): Payer: Medicare HMO

## 2015-01-21 ENCOUNTER — Other Ambulatory Visit: Payer: Self-pay | Admitting: Family Medicine

## 2015-01-21 DIAGNOSIS — K219 Gastro-esophageal reflux disease without esophagitis: Secondary | ICD-10-CM

## 2015-01-21 DIAGNOSIS — N400 Enlarged prostate without lower urinary tract symptoms: Secondary | ICD-10-CM

## 2015-01-21 DIAGNOSIS — I10 Essential (primary) hypertension: Secondary | ICD-10-CM

## 2015-01-21 MED ORDER — ROSUVASTATIN CALCIUM 20 MG PO TABS: 20.0000 mg | ORAL_TABLET | Freq: Every day | ORAL | Status: DC

## 2015-01-21 MED ORDER — PANTOPRAZOLE SODIUM 40 MG PO TBEC: 40.0000 mg | DELAYED_RELEASE_TABLET | Freq: Every day | ORAL | Status: DC

## 2015-01-21 MED ORDER — FINASTERIDE 5 MG PO TABS: 5.0000 mg | ORAL_TABLET | Freq: Every day | ORAL | Status: DC

## 2015-01-21 MED ORDER — METOPROLOL SUCCINATE ER 25 MG PO TB24: 25.0000 mg | ORAL_TABLET | Freq: Every day | ORAL | Status: DC

## 2015-01-21 MED ORDER — LOSARTAN POTASSIUM 50 MG PO TABS: 50.0000 mg | ORAL_TABLET | Freq: Every day | ORAL | Status: DC

## 2015-01-25 ENCOUNTER — Ambulatory Visit (INDEPENDENT_AMBULATORY_CARE_PROVIDER_SITE_OTHER): Payer: Commercial Managed Care - HMO | Admitting: Family Medicine

## 2015-01-25 ENCOUNTER — Encounter: Payer: Self-pay | Admitting: Family Medicine

## 2015-01-25 VITALS — BP 120/84 | HR 72 | Temp 97.9°F | Resp 16 | Ht 69.0 in | Wt 182.5 lb

## 2015-01-25 DIAGNOSIS — H409 Unspecified glaucoma: Secondary | ICD-10-CM

## 2015-01-25 DIAGNOSIS — E785 Hyperlipidemia, unspecified: Secondary | ICD-10-CM | POA: Diagnosis not present

## 2015-01-25 DIAGNOSIS — K219 Gastro-esophageal reflux disease without esophagitis: Secondary | ICD-10-CM

## 2015-01-25 DIAGNOSIS — I10 Essential (primary) hypertension: Secondary | ICD-10-CM | POA: Diagnosis not present

## 2015-01-25 DIAGNOSIS — N289 Disorder of kidney and ureter, unspecified: Secondary | ICD-10-CM

## 2015-01-25 DIAGNOSIS — K5900 Constipation, unspecified: Secondary | ICD-10-CM | POA: Diagnosis not present

## 2015-01-25 DIAGNOSIS — E782 Mixed hyperlipidemia: Secondary | ICD-10-CM | POA: Diagnosis not present

## 2015-01-25 DIAGNOSIS — Z0000 Encounter for general adult medical examination without abnormal findings: Secondary | ICD-10-CM

## 2015-01-25 NOTE — Progress Notes (Signed)
Troy Ray  [id number] Mar 04, 1928 01/25/2015      Progress Note-Follow Up  Subjective  Chief Complaint  Chief Complaint  Patient presents with  . Medicare Wellness    subsequent    HPI  Patient is a 81 y.o. male in today for routine medical care. Patient is in today for ongoing medical care. Generally doing well. No recent illness although he was hospitalized back in October. Struggling with some constipation noting hemostasis bowels most days but they're hard and difficult to pass times. Has some decreased hearing and has some hearing aids but does not find them terribly helpful. Has used probiotics in stool softeners and noted his bowel frequency has improved somewhat. No bloody or tarry stool. Doing well at home. Denies any trouble with his activities of daily living, depression or acute concerns. Denies CP/palp/SOB/HA/congestion/fevers/GI or GU c/o. Taking meds as prescribed  Past Medical History  Diagnosis Date  . CAD (coronary artery disease)   . Hypertension   . Chronic kidney disease     chronic, stage II  . Hyperlipidemia   . PVD (peripheral vascular disease)   . Postural lightheadedness   . Memory loss   . Dysphagia   . Esophageal stricture     Dr [name]  . CRI (chronic renal insufficiency)   . Sensorineural hearing loss, bilateral   . Carotid stenosis, bilateral     right 60-79% stenosed.  Left 40-59%  . Adenomatous colon polyp   . Diverticulosis   . Unspecified constipation 12/29/2012  . BPH (benign prostatic hyperplasia) 07/14/2013  . Headache(784.0) 12/01/2013  . Glaucoma     Past Surgical History  Procedure Laterality Date  . Hernia repair  03/23/05    left inguinal Lichtenstein repair, with mesh.  [name]  . Abdominal aortic aneurysm repair  1996    Dr [name]  . Renal artery stent  1996    Dr [name].  Bilateral stenting  . Coronary artery bypass graft      Family History  Problem Relation Age of Onset  . Heart disease Father     CAD   . Cancer Neg Hx   . Diabetes Neg Hx   . Coronary artery disease Father     History   Social History  . Marital Status: Married    Spouse Name: N/A  . Number of Children: 3  . Years of Education: N/A   Occupational History  . MEDIA ASSISTANT    Social History Main Topics  . Smoking status: Former Research scientist (life sciences)  . Smokeless tobacco: Never Used     Comment: quit 1980  . Alcohol Use: 0.6 oz/week    1 Glasses of wine per week  . Drug Use: No  . Sexual Activity: Yes   Other Topics Concern  . Not on file   Social History Narrative    Current Outpatient Prescriptions on File Prior to Visit  Medication Sig Dispense Refill  . aspirin EC 81 MG EC tablet Take 81 mg by mouth at bedtime.     . cefpodoxime (VANTIN) 200 MG tablet Take 1 tablet (200 mg total) by mouth 2 (two) times daily. 9 tablet 0  . finasteride (PROSCAR) 5 MG tablet Take 1 tablet (5 mg total) by mouth daily. 90 tablet 1  . losartan (COZAAR) 50 MG tablet Take 1 tablet (50 mg total) by mouth daily. 90 tablet 1  . metoprolol succinate (TOPROL-XL) 25 MG 24 hr tablet Take 1 tablet (25 mg total) by mouth daily  with supper. 90 tablet 1  . pantoprazole (PROTONIX) 40 MG tablet Take 1 tablet (40 mg total) by mouth daily. 90 tablet 1  . rosuvastatin (CRESTOR) 20 MG tablet Take 1 tablet (20 mg total) by mouth daily. 90 tablet 1   No current facility-administered medications on file prior to visit.    No Known Allergies  Review of Systems  Review of Systems  Constitutional: Negative for fever, chills and malaise/fatigue.  HENT: Negative for congestion, hearing loss and nosebleeds.   Eyes: Negative for discharge.  Respiratory: Negative for cough, sputum production, shortness of breath and wheezing.   Cardiovascular: Negative for chest pain, palpitations and leg swelling.  Gastrointestinal: Negative for heartburn, nausea, vomiting, abdominal pain, diarrhea, constipation and blood in stool.  Genitourinary: Negative for dysuria,  urgency, frequency and hematuria.  Musculoskeletal: Negative for myalgias, back pain and falls.  Skin: Negative for rash.  Neurological: Negative for dizziness, tremors, sensory change, focal weakness, loss of consciousness, weakness and headaches.  Endo/Heme/Allergies: Negative for polydipsia. Does not bruise/bleed easily.  Psychiatric/Behavioral: Negative for depression and suicidal ideas. The patient is not nervous/anxious and does not have insomnia.     Objective  BP 120/84 mmHg  Pulse 72  Temp(Src) 97.9 F (36.6 C) (Oral)  Resp 16  Ht 5\' 9"  (1.753 m)  Wt 182 lb 8 oz (82.781 kg)  BMI 26.94 kg/m2  SpO2 96%  Physical Exam  Physical Exam  Constitutional: He is oriented to person, place, and time and well-developed, well-nourished, and in no distress. No distress.  HENT:  Head: Normocephalic and atraumatic.  Eyes: Conjunctivae are normal.  Neck: Neck supple. No thyromegaly present.  Cardiovascular: Normal rate, regular rhythm and normal heart sounds.   No murmur heard. Pulmonary/Chest: Effort normal and breath sounds normal. No respiratory distress.  Abdominal: He exhibits no distension and no mass. There is no tenderness.  Musculoskeletal: He exhibits no edema.  Neurological: He is alert and oriented to person, place, and time.  Skin: Skin is warm.  Psychiatric: Memory, affect and judgment normal.    Lab Results  Component Value Date   TSH 1.55 08/14/2014   Lab Results  Component Value Date   WBC 10.0 09/01/2014   HGB 15.4 09/01/2014   HCT 46.9 09/01/2014   MCV 90.4 09/01/2014   PLT 285.0 09/01/2014   Lab Results  Component Value Date   CREATININE 1.5 09/01/2014   BUN 19 09/01/2014   NA 138 09/01/2014   K 4.4 09/01/2014   CL 107 09/01/2014   CO2 17* 09/01/2014   Lab Results  Component Value Date   ALT 16 08/20/2014   AST 22 08/20/2014   ALKPHOS 49 08/20/2014   BILITOT 0.5 08/20/2014   Lab Results  Component Value Date   CHOL 130 08/14/2014   Lab  Results  Component Value Date   HDL 33.60* 08/14/2014   Lab Results  Component Value Date   LDLCALC 73 08/14/2014   Lab Results  Component Value Date   TRIG 116.0 08/14/2014   Lab Results  Component Value Date   CHOLHDL 4 08/14/2014     Assessment & Plan   Essential hypertension Well controlled, no changes to meds. Encouraged heart healthy diet such as the DASH diet and exercise as tolerated.    Constipation Encouraged increased hydration and fiber in diet. Daily probiotics. If bowels not moving can use MOM 2 tbls po in 4 oz of warm prune juice by mouth every 2-3 days. If no results then repeat  in 4 hours with  Dulcolax suppository pr, may repeat again in 4 more hours as needed. Seek care if symptoms worsen. Consider daily Miralax and/or Dulcolax if symptoms persist. May use Colace or Pericolace daily as needed   Medicare annual wellness visit, subsequent Sees Dr Erskine Emery [facility], last colonoscopy in 09/2010 Sees Dr Posey Pronto, audiology Sees [facility] Immunizations UTD Patient denies any difficulties at home. No trouble with ADLs, depression or falls. No recent changes to vision or hearing. Is UTD with immunizations. Is UTD with screening. Discussed Advanced Directives, patient agrees to bring [place] copies of documents if can. Encouraged heart healthy diet, exercise as tolerated and adequate sleep See problem risk for risk factors and AVS for suggested preventative care. Annual labs ordered and reviewed   Hyperlipidemia Tolerating statin, encouraged heart healthy diet, avoid trans fats, minimize simple carbs and saturated fats. Increase exercise as tolerated

## 2015-01-25 NOTE — Patient Instructions (Addendum)
Probiotic daily Start a stool softener  Preventive Care for Adults A healthy lifestyle and preventive care can promote health and wellness. Preventive health guidelines for men include the following key practices:  A routine yearly physical is a good way to check with your health care provider about your health and preventative screening. It is a chance to share any concerns and updates on your health and to receive a thorough exam.  Visit your dentist for a routine exam and preventative care every 6 months. Brush your teeth twice a day and floss once a day. Good oral hygiene prevents tooth decay and gum disease.  The frequency of eye exams is based on your age, health, family medical history, use of contact lenses, and other factors. Follow your health care provider's recommendations for frequency of eye exams.  Eat a healthy diet. Foods such as vegetables, fruits, whole grains, low-fat dairy products, and lean protein foods contain the nutrients you need without too many calories. Decrease your intake of foods high in solid fats, added sugars, and salt. Eat the right amount of calories for you.Get information about a proper diet from your health care provider, if necessary.  Regular physical exercise is one of the most important things you can do for your health. Most adults should get at least 150 minutes of moderate-intensity exercise (any activity that increases your heart rate and causes you to sweat) each week. In addition, most adults need muscle-strengthening exercises on 2 or more days a week.  Maintain a healthy weight. The body mass index (BMI) is a screening tool to identify possible weight problems. It provides an estimate of body fat based on height and weight. Your health care provider can find your BMI and can help you achieve or maintain a healthy weight.For adults 20 years and older:  A BMI below 18.5 is considered underweight.  A BMI of 18.5 to 24.9 is normal.  A BMI of 25  to 29.9 is considered overweight.  A BMI of 30 and above is considered obese.  Maintain normal blood lipids and cholesterol levels by exercising and minimizing your intake of saturated fat. Eat a balanced diet with plenty of fruit and vegetables. Blood tests for lipids and cholesterol should begin at age 45 and be repeated every 5 years. If your lipid or cholesterol levels are high, you are over 50, or you are at high risk for heart disease, you may need your cholesterol levels checked more frequently.Ongoing high lipid and cholesterol levels should be treated with medicines if diet and exercise are not working.  If you smoke, find out from your health care provider how to quit. If you do not use tobacco, do not start.  Lung cancer screening is recommended for adults aged 73-80 years who are at high risk for developing lung cancer because of a history of smoking. A yearly low-dose CT scan of the lungs is recommended for people who have at least a 30-pack-year history of smoking and are a current smoker or have quit within the past 15 years. A pack year of smoking is smoking an average of 1 pack of cigarettes a day for 1 year (for example: 1 pack a day for 30 years or 2 packs a day for 15 years). Yearly screening should continue until the smoker has stopped smoking for at least 15 years. Yearly screening should be stopped for people who develop a health problem that would prevent them from having lung cancer treatment.  If you choose to  drink alcohol, do not have more than 2 drinks per day. One drink is considered to be 12 ounces (355 mL) of beer, 5 ounces (148 mL) of wine, or 1.5 ounces (44 mL) of liquor.  Avoid use of street drugs. Do not share needles with anyone. Ask for help if you need support or instructions about stopping the use of drugs.  High blood pressure causes heart disease and increases the risk of stroke. Your blood pressure should be checked at least every 1-2 years. Ongoing high  blood pressure should be treated with medicines, if weight loss and exercise are not effective.  If you are 41-75 years old, ask your health care provider if you should take aspirin to prevent heart disease.  Diabetes screening involves taking a blood sample to check your fasting blood sugar level. This should be done once every 3 years, after age 58, if you are within normal weight and without risk factors for diabetes. Testing should be considered at a younger age or be carried out more frequently if you are overweight and have at least 1 risk factor for diabetes.  Colorectal cancer can be detected and often prevented. Most routine colorectal cancer screening begins at the age of 41 and continues through age 31. However, your health care provider may recommend screening at an earlier age if you have risk factors for colon cancer. On a yearly basis, your health care provider may provide home test kits to check for hidden blood in the stool. Use of a small camera at the end of a tube to directly examine the colon (sigmoidoscopy or colonoscopy) can detect the earliest forms of colorectal cancer. Talk to your health care provider about this at age 53, when routine screening begins. Direct exam of the colon should be repeated every 5-10 years through age 55, unless early forms of precancerous polyps or small growths are found.  People who are at an increased risk for hepatitis B should be screened for this virus. You are considered at high risk for hepatitis B if:  You were born in a country where hepatitis B occurs often. Talk with your health care provider about which countries are considered high risk.  Your parents were born in a high-risk country and you have not received a shot to protect against hepatitis B (hepatitis B vaccine).  You have HIV or AIDS.  You use needles to inject street drugs.  You live with, or have sex with, someone who has hepatitis B.  You are a man who has sex with other  men (MSM).  You get hemodialysis treatment.  You take certain medicines for conditions such as cancer, organ transplantation, and autoimmune conditions.  Hepatitis C blood testing is recommended for all people born from [birth year] through 1965 and any individual with known risks for hepatitis C.  Practice safe sex. Use condoms and avoid high-risk sexual practices to reduce the spread of sexually transmitted infections (STIs). STIs include gonorrhea, chlamydia, syphilis, trichomonas, herpes, HPV, and human immunodeficiency virus (HIV). Herpes, HIV, and HPV are viral illnesses that have no cure. They can result in disability, cancer, and death.  If you are at risk of being infected with HIV, it is recommended that you take a prescription medicine daily to prevent HIV infection. This is called preexposure prophylaxis (PrEP). You are considered at risk if:  You are a man who has sex with other men (MSM) and have other risk factors.  You are a heterosexual man, are sexually active, and  are at increased risk for HIV infection.  You take drugs by injection.  You are sexually active with a partner who has HIV.  Talk with your health care provider about whether you are at high risk of being infected with HIV. If you choose to begin PrEP, you should first be tested for HIV. You should then be tested every 3 months for as long as you are taking PrEP.  A one-time screening for abdominal aortic aneurysm (AAA) and surgical repair of large AAAs by ultrasound are recommended for men ages 31 to 91 years who are current or former smokers.  Healthy men should no longer receive prostate-specific antigen (PSA) blood tests as part of routine cancer screening. Talk with your health care provider about prostate cancer screening.  Testicular cancer screening is not recommended for adult males who have no symptoms. Screening includes self-exam, a health care provider exam, and other screening tests. Consult with your  health care provider about any symptoms you have or any concerns you have about testicular cancer.  Use sunscreen. Apply sunscreen liberally and repeatedly throughout the day. You should seek shade when your shadow is shorter than you. Protect yourself by wearing long sleeves, pants, a wide-brimmed hat, and sunglasses year round, whenever you are outdoors.  Once a month, do a whole-body skin exam, using a mirror to look at the skin on your back. Tell your health care provider about new moles, moles that have irregular borders, moles that are larger than a pencil eraser, or moles that have changed in shape or color.  Stay current with required vaccines (immunizations).  Influenza vaccine. All adults should be immunized every year.  Tetanus, diphtheria, and acellular pertussis (Td, Tdap) vaccine. An adult who has not previously received Tdap or who does not know his vaccine status should receive 1 dose of Tdap. This initial dose should be followed by tetanus and diphtheria toxoids (Td) booster doses every 10 years. Adults with an unknown or incomplete history of completing a 3-dose immunization series with Td-containing vaccines should begin or complete a primary immunization series including a Tdap dose. Adults should receive a Td booster every 10 years.  Varicella vaccine. An adult without evidence of immunity to varicella should receive 2 doses or a second dose if he has previously received 1 dose.  Human papillomavirus (HPV) vaccine. Males aged 99-21 years who have not received the vaccine previously should receive the 3-dose series. Males aged 22-26 years may be immunized. Immunization is recommended through the age of 26 years for any male who has sex with males and did not get any or all doses earlier. Immunization is recommended for any person with an immunocompromised condition through the age of 28 years if he did not get any or all doses earlier. During the 3-dose series, the second dose  should be obtained 4-8 weeks after the first dose. The third dose should be obtained 24 weeks after the first dose and 16 weeks after the second dose.  Zoster vaccine. One dose is recommended for adults aged 16 years or older unless certain conditions are present.  Measles, mumps, and rubella (MMR) vaccine. Adults born before [birth year] generally are considered immune to measles and mumps. Adults born in [birth year] or later should have 1 or more doses of MMR vaccine unless there is a contraindication to the vaccine or there is laboratory evidence of immunity to each of the three diseases. A routine second dose of MMR vaccine should be obtained at least 28 days  after the first dose for students attending postsecondary schools, health care workers, or international travelers. People who received inactivated measles vaccine or an unknown type of measles vaccine during 1963-1967 should receive 2 doses of MMR vaccine. People who received inactivated mumps vaccine or an unknown type of mumps vaccine before 1979 and are at high risk for mumps infection should consider immunization with 2 doses of MMR vaccine. Unvaccinated health care workers born before 1 who lack laboratory evidence of measles, mumps, or rubella immunity or laboratory confirmation of disease should consider measles and mumps immunization with 2 doses of MMR vaccine or rubella immunization with 1 dose of MMR vaccine.  Pneumococcal 13-valent conjugate (PCV13) vaccine. When indicated, a person who is uncertain of his immunization history and has no record of immunization should receive the PCV13 vaccine. An adult aged 76 years or older who has certain medical conditions and has not been previously immunized should receive 1 dose of PCV13 vaccine. This PCV13 should be followed with a dose of pneumococcal polysaccharide (PPSV23) vaccine. The PPSV23 vaccine dose should be obtained at least 8 weeks after the dose of PCV13 vaccine. An adult aged 27 years or older  who has certain medical conditions and previously received 1 or more doses of PPSV23 vaccine should receive 1 dose of PCV13. The PCV13 vaccine dose should be obtained 1 or more years after the last PPSV23 vaccine dose.  Pneumococcal polysaccharide (PPSV23) vaccine. When PCV13 is also indicated, PCV13 should be obtained first. All adults aged 55 years and older should be immunized. An adult younger than age 20 years who has certain medical conditions should be immunized. Any person who resides in a nursing home or long-term care facility should be immunized. An adult smoker should be immunized. People with an immunocompromised condition and certain other conditions should receive both PCV13 and PPSV23 vaccines. People with human immunodeficiency virus (HIV) infection should be immunized as soon as possible after diagnosis. Immunization during chemotherapy or radiation therapy should be avoided. Routine use of PPSV23 vaccine is not recommended for American Indians, [place] Natives, or people younger than 65 years unless there are medical conditions that require PPSV23 vaccine. When indicated, people who have unknown immunization and have no record of immunization should receive PPSV23 vaccine. One-time revaccination 5 years after the first dose of PPSV23 is recommended for people aged 19-64 years who have chronic kidney failure, nephrotic syndrome, asplenia, or immunocompromised conditions. People who received 1-2 doses of PPSV23 before age 60 years should receive another dose of PPSV23 vaccine at age 40 years or later if at least 5 years have passed since the previous dose. Doses of PPSV23 are not needed for people immunized with PPSV23 at or after age 17 years.  Meningococcal vaccine. Adults with asplenia or persistent complement component deficiencies should receive 2 doses of quadrivalent meningococcal conjugate (MenACWY-D) vaccine. The doses should be obtained at least 2 months apart. Microbiologists working  with certain meningococcal bacteria, [place] recruits, people at risk during an outbreak, and people who travel to or live in countries with a high rate of meningitis should be immunized. A first-year college student up through age 56 years who is living in a residence hall should receive a dose if he did not receive a dose on or after his 16th birthday. Adults who have certain high-risk conditions should receive one or more doses of vaccine.  Hepatitis A vaccine. Adults who wish to be protected from this disease, have certain high-risk conditions, work with hepatitis A-infected  animals, work in hepatitis A research labs, or travel to or work in countries with a high rate of hepatitis A should be immunized. Adults who were previously unvaccinated and who anticipate close contact with an international adoptee during the first 60 days after arrival in the [place] from a country with a high rate of hepatitis A should be immunized.  Hepatitis B vaccine. Adults should be immunized if they wish to be protected from this disease, have certain high-risk conditions, may be exposed to blood or other infectious body fluids, are household contacts or sex partners of hepatitis B positive people, are clients or workers in certain care facilities, or travel to or work in countries with a high rate of hepatitis B.  Haemophilus influenzae type b (Hib) vaccine. A previously unvaccinated person with asplenia or sickle cell disease or having a scheduled splenectomy should receive 1 dose of Hib vaccine. Regardless of previous immunization, a recipient of a hematopoietic stem cell transplant should receive a 3-dose series 6-12 months after his successful transplant. Hib vaccine is not recommended for adults with HIV infection. Preventive Service / Frequency Ages 79 to 2  Blood pressure check.** / Every 1 to 2 years.  Lipid and cholesterol check.** / Every 5 years beginning at age 30.  Hepatitis C blood test.** / For  any individual with known risks for hepatitis C.  Skin self-exam. / Monthly.  Influenza vaccine. / Every year.  Tetanus, diphtheria, and acellular pertussis (Tdap, Td) vaccine.** / Consult your health care provider. 1 dose of Td every 10 years.  Varicella vaccine.** / Consult your health care provider.  HPV vaccine. / 3 doses over 6 months, if 62 or younger.  Measles, mumps, rubella (MMR) vaccine.** / You need at least 1 dose of MMR if you were born in 1957 or later. You may also need a second dose.  Pneumococcal 13-valent conjugate (PCV13) vaccine.** / Consult your health care provider.  Pneumococcal polysaccharide (PPSV23) vaccine.** / 1 to 2 doses if you smoke cigarettes or if you have certain conditions.  Meningococcal vaccine.** / 1 dose if you are age 44 to 32 years and a Market researcher living in a residence hall, or have one of several medical conditions. You may also need additional booster doses.  Hepatitis A vaccine.** / Consult your health care provider.  Hepatitis B vaccine.** / Consult your health care provider.  Haemophilus influenzae type b (Hib) vaccine.** / Consult your health care provider. Ages 71 to 70  Blood pressure check.** / Every 1 to 2 years.  Lipid and cholesterol check.** / Every 5 years beginning at age 70.  Lung cancer screening. / Every year if you are aged 45-80 years and have a 30-pack-year history of smoking and currently smoke or have quit within the past 15 years. Yearly screening is stopped once you have quit smoking for at least 15 years or develop a health problem that would prevent you from having lung cancer treatment.  Fecal occult blood test (FOBT) of stool. / Every year beginning at age 28 and continuing until age 52. You may not have to do this test if you get a colonoscopy every 10 years.  Flexible sigmoidoscopy** or colonoscopy.** / Every 5 years for a flexible sigmoidoscopy or every 10 years for a colonoscopy beginning at  age 54 and continuing until age 31.  Hepatitis C blood test.** / For all people born from [birth year] through 1965 and any individual with known risks for hepatitis C.  Skin  self-exam. / Monthly.  Influenza vaccine. / Every year.  Tetanus, diphtheria, and acellular pertussis (Tdap/Td) vaccine.** / Consult your health care provider. 1 dose of Td every 10 years.  Varicella vaccine.** / Consult your health care provider.  Zoster vaccine.** / 1 dose for adults aged 1 years or older.  Measles, mumps, rubella (MMR) vaccine.** / You need at least 1 dose of MMR if you were born in 1957 or later. You may also need a second dose.  Pneumococcal 13-valent conjugate (PCV13) vaccine.** / Consult your health care provider.  Pneumococcal polysaccharide (PPSV23) vaccine.** / 1 to 2 doses if you smoke cigarettes or if you have certain conditions.  Meningococcal vaccine.** / Consult your health care provider.  Hepatitis A vaccine.** / Consult your health care provider.  Hepatitis B vaccine.** / Consult your health care provider.  Haemophilus influenzae type b (Hib) vaccine.** / Consult your health care provider. Ages 25 and over  Blood pressure check.** / Every 1 to 2 years.  Lipid and cholesterol check.**/ Every 5 years beginning at age 30.  Lung cancer screening. / Every year if you are aged 32-80 years and have a 30-pack-year history of smoking and currently smoke or have quit within the past 15 years. Yearly screening is stopped once you have quit smoking for at least 15 years or develop a health problem that would prevent you from having lung cancer treatment.  Fecal occult blood test (FOBT) of stool. / Every year beginning at age 91 and continuing until age 56. You may not have to do this test if you get a colonoscopy every 10 years.  Flexible sigmoidoscopy** or colonoscopy.** / Every 5 years for a flexible sigmoidoscopy or every 10 years for a colonoscopy beginning at age 20 and continuing until  age 82.  Hepatitis C blood test.** / For all people born from [birth year] through 1965 and any individual with known risks for hepatitis C.  Abdominal aortic aneurysm (AAA) screening.** / A one-time screening for ages 31 to 44 years who are current or former smokers.  Skin self-exam. / Monthly.  Influenza vaccine. / Every year.  Tetanus, diphtheria, and acellular pertussis (Tdap/Td) vaccine.** / 1 dose of Td every 10 years.  Varicella vaccine.** / Consult your health care provider.  Zoster vaccine.** / 1 dose for adults aged 70 years or older.  Pneumococcal 13-valent conjugate (PCV13) vaccine.** / Consult your health care provider.  Pneumococcal polysaccharide (PPSV23) vaccine.** / 1 dose for all adults aged 61 years and older.  Meningococcal vaccine.** / Consult your health care provider.  Hepatitis A vaccine.** / Consult your health care provider.  Hepatitis B vaccine.** / Consult your health care provider.  Haemophilus influenzae type b (Hib) vaccine.** / Consult your health care provider. **Family history and personal history of risk and conditions may change your health care provider's recommendations. Document Released: 12/19/2001 Document Revised: 10/28/2013 Document Reviewed: 03/20/2011 [facility] Patient Information 2015 [place], [place]. This information is not intended to replace advice given to you by your health care provider. Make sure you discuss any questions you have with your health care provider.

## 2015-01-25 NOTE — Progress Notes (Signed)
Pre visit review using our clinic review tool, if applicable. No additional management support is needed unless otherwise documented below in the visit note. 

## 2015-01-27 ENCOUNTER — Other Ambulatory Visit: Payer: Commercial Managed Care - HMO

## 2015-01-27 LAB — LIPID PANEL
CHOLESTEROL: 132 mg/dL (ref 0–200)
HDL: 39 mg/dL — AB (ref >39.00)
LDL Cholesterol: 69 mg/dL (ref 0–99)
NonHDL: 93
Total CHOL/HDL Ratio: 3
Triglycerides: 121 mg/dL (ref 0.0–149.0)
VLDL: 24.2 mg/dL (ref 0.0–40.0)

## 2015-01-27 LAB — CBC
HCT: 43.9 % (ref 39.0–52.0)
Hemoglobin: 14.8 g/dL (ref 13.0–17.0)
MCHC: 33.7 g/dL (ref 30.0–36.0)
MCV: 87.2 fl (ref 78.0–100.0)
Platelets: 218 10*3/uL (ref 150.0–400.0)
RBC: 5.03 Mil/uL (ref 4.22–5.81)
RDW: 15.2 % (ref 11.5–15.5)
WBC: 8.8 10*3/uL (ref 4.0–10.5)

## 2015-01-27 LAB — COMPREHENSIVE METABOLIC PANEL
ALBUMIN: 4 g/dL (ref 3.5–5.2)
ALK PHOS: 42 U/L (ref 39–117)
ALT: 15 U/L (ref 0–53)
AST: 20 U/L (ref 0–37)
BUN: 21 mg/dL (ref 6–23)
CALCIUM: 9.4 mg/dL (ref 8.4–10.5)
CHLORIDE: 105 meq/L (ref 96–112)
CO2: 25 mEq/L (ref 19–32)
CREATININE: 1.37 mg/dL (ref 0.40–1.50)
GFR: 52.51 mL/min — AB (ref >60.00)
Glucose, Bld: 116 mg/dL — ABNORMAL HIGH (ref 70–99)
POTASSIUM: 4.3 meq/L (ref 3.5–5.1)
SODIUM: 138 meq/L (ref 135–145)
TOTAL PROTEIN: 7.1 g/dL (ref 6.0–8.3)
Total Bilirubin: 0.5 mg/dL (ref 0.2–1.2)

## 2015-01-27 LAB — TSH: TSH: 3.98 u[IU]/mL (ref 0.35–4.50)

## 2015-01-28 ENCOUNTER — Encounter: Payer: Self-pay | Admitting: General Practice

## 2015-02-04 ENCOUNTER — Encounter: Payer: Self-pay | Admitting: Family Medicine

## 2015-02-04 DIAGNOSIS — Z0000 Encounter for general adult medical examination without abnormal findings: Secondary | ICD-10-CM

## 2015-02-04 HISTORY — DX: Encounter for general adult medical examination without abnormal findings: Z00.00

## 2015-02-04 NOTE — Assessment & Plan Note (Signed)
Tolerating statin, encouraged heart healthy diet, avoid trans fats, minimize simple carbs and saturated fats. Increase exercise as tolerated 

## 2015-02-04 NOTE — Assessment & Plan Note (Signed)
Encouraged increased hydration and fiber in diet. Daily probiotics. If bowels not moving can use MOM 2 tbls po in 4 oz of warm prune juice by mouth every 2-3 days. If no results then repeat in 4 hours with  Dulcolax suppository pr, may repeat again in 4 more hours as needed. Seek care if symptoms worsen. Consider daily Miralax and/or Dulcolax if symptoms persist. May use Colace or Pericolace daily as needed

## 2015-02-04 NOTE — Assessment & Plan Note (Signed)
Sees Dr Erskine Emery of [facility], last colonoscopy in 09/2010 Sees Dr Posey Pronto, audiology Sees [facility] Immunizations UTD Patient denies any difficulties at home. No trouble with ADLs, depression or falls. No recent changes to vision or hearing. Is UTD with immunizations. Is UTD with screening. Discussed Advanced Directives, patient agrees to bring [place] copies of documents if can. Encouraged heart healthy diet, exercise as tolerated and adequate sleep See problem risk for risk factors and AVS for suggested preventative care. Annual labs ordered and reviewed

## 2015-02-04 NOTE — Assessment & Plan Note (Signed)
Well controlled, no changes to meds. Encouraged heart healthy diet such as the DASH diet and exercise as tolerated.  °

## 2015-02-16 DIAGNOSIS — H4011X2 Primary open-angle glaucoma, moderate stage: Secondary | ICD-10-CM | POA: Diagnosis not present

## 2015-04-13 DIAGNOSIS — H01005 Unspecified blepharitis left lower eyelid: Secondary | ICD-10-CM | POA: Diagnosis not present

## 2015-04-13 DIAGNOSIS — H01001 Unspecified blepharitis right upper eyelid: Secondary | ICD-10-CM | POA: Diagnosis not present

## 2015-04-13 DIAGNOSIS — H01002 Unspecified blepharitis right lower eyelid: Secondary | ICD-10-CM | POA: Diagnosis not present

## 2015-04-13 DIAGNOSIS — H4011X2 Primary open-angle glaucoma, moderate stage: Secondary | ICD-10-CM | POA: Diagnosis not present

## 2015-04-13 DIAGNOSIS — H01004 Unspecified blepharitis left upper eyelid: Secondary | ICD-10-CM | POA: Diagnosis not present

## 2015-04-13 DIAGNOSIS — Z961 Presence of intraocular lens: Secondary | ICD-10-CM | POA: Diagnosis not present

## 2015-04-13 DIAGNOSIS — H26492 Other secondary cataract, left eye: Secondary | ICD-10-CM | POA: Diagnosis not present

## 2015-04-29 ENCOUNTER — Encounter: Payer: Self-pay | Admitting: Family Medicine

## 2015-04-29 ENCOUNTER — Ambulatory Visit (INDEPENDENT_AMBULATORY_CARE_PROVIDER_SITE_OTHER): Payer: Commercial Managed Care - HMO | Admitting: Family Medicine

## 2015-04-29 VITALS — BP 128/82 | HR 53 | Temp 98.3°F | Ht 69.5 in | Wt 181.5 lb

## 2015-04-29 DIAGNOSIS — R739 Hyperglycemia, unspecified: Secondary | ICD-10-CM

## 2015-04-29 DIAGNOSIS — I10 Essential (primary) hypertension: Secondary | ICD-10-CM

## 2015-04-29 DIAGNOSIS — G4700 Insomnia, unspecified: Secondary | ICD-10-CM

## 2015-04-29 DIAGNOSIS — K219 Gastro-esophageal reflux disease without esophagitis: Secondary | ICD-10-CM | POA: Diagnosis not present

## 2015-04-29 DIAGNOSIS — E785 Hyperlipidemia, unspecified: Secondary | ICD-10-CM

## 2015-04-29 DIAGNOSIS — L989 Disorder of the skin and subcutaneous tissue, unspecified: Secondary | ICD-10-CM

## 2015-04-29 DIAGNOSIS — K5900 Constipation, unspecified: Secondary | ICD-10-CM

## 2015-04-29 DIAGNOSIS — N400 Enlarged prostate without lower urinary tract symptoms: Secondary | ICD-10-CM | POA: Diagnosis not present

## 2015-04-29 LAB — HEMOGLOBIN A1C: HEMOGLOBIN A1C: 6.1 % (ref 4.6–6.5)

## 2015-04-29 MED ORDER — PANTOPRAZOLE SODIUM 40 MG PO TBEC: 40.0000 mg | DELAYED_RELEASE_TABLET | Freq: Every day | ORAL | Status: DC

## 2015-04-29 MED ORDER — LOSARTAN POTASSIUM 50 MG PO TABS: 50.0000 mg | ORAL_TABLET | Freq: Every day | ORAL | Status: DC

## 2015-04-29 MED ORDER — ROSUVASTATIN CALCIUM 20 MG PO TABS: 20.0000 mg | ORAL_TABLET | Freq: Every day | ORAL | Status: DC

## 2015-04-29 MED ORDER — METOPROLOL SUCCINATE ER 25 MG PO TB24: 25.0000 mg | ORAL_TABLET | Freq: Every day | ORAL | Status: DC

## 2015-04-29 MED ORDER — FINASTERIDE 5 MG PO TABS: 5.0000 mg | ORAL_TABLET | Freq: Every day | ORAL | Status: DC

## 2015-04-29 NOTE — Progress Notes (Signed)
Troy Ray  [id number] November 18, 1927 04/29/2015      Progress Note-Follow Up  Subjective  Chief Complaint  Chief Complaint  Patient presents with  . Follow-up    spots on arms    HPI  Patient is a 81 y.o. male in today for routine medical care.  Past Medical History  Diagnosis Date  . CAD (coronary artery disease)   . Hypertension   . Chronic kidney disease     chronic, stage II  . Hyperlipidemia   . PVD (peripheral vascular disease)   . Postural lightheadedness   . Memory loss   . Dysphagia   . Esophageal stricture     Dr [name]  . CRI (chronic renal insufficiency)   . Sensorineural hearing loss, bilateral   . Carotid stenosis, bilateral     right 60-79% stenosed.  Left 40-59%  . Adenomatous colon polyp   . Diverticulosis   . Unspecified constipation 12/29/2012  . BPH (benign prostatic hyperplasia) 07/14/2013  . Headache(784.0) 12/01/2013  . Glaucoma   . Medicare annual wellness visit, subsequent 02/04/2015    Past Surgical History  Procedure Laterality Date  . Hernia repair  03/23/05    left inguinal Lichtenstein repair, with mesh.  [name]  . Abdominal aortic aneurysm repair  1996    Dr [name]  . Renal artery stent  1996    Dr [name].  Bilateral stenting  . Coronary artery bypass graft    . Eye surgery      cataracts b/l and laser    Family History  Problem Relation Age of Onset  . Heart disease Father     CAD  . Cancer Neg Hx   . Diabetes Neg Hx   . Coronary artery disease Father     History   Social History  . Marital Status: Married    Spouse Name: N/A  . Number of Children: 3  . Years of Education: N/A   Occupational History  . MEDIA ASSISTANT    Social History Main Topics  . Smoking status: Former Research scientist (life sciences)  . Smokeless tobacco: Never Used     Comment: quit 1980  . Alcohol Use: 0.6 oz/week    1 Glasses of wine per week  . Drug Use: No  . Sexual Activity: Yes   Other Topics Concern  . Not on file   Social History  Narrative    Current Outpatient Prescriptions on File Prior to Visit  Medication Sig Dispense Refill  . aspirin EC 81 MG EC tablet Take 81 mg by mouth at bedtime.     [name] Kitchen LUMIGAN 0.01 % SOLN      No current facility-administered medications on file prior to visit.    No Known Allergies  Review of Systems  Review of Systems  Constitutional: Positive for malaise/fatigue. Negative for fever.  HENT: Negative for congestion.   Eyes: Negative for discharge.  Respiratory: Negative for shortness of breath.   Cardiovascular: Negative for chest pain, palpitations and leg swelling.  Gastrointestinal: Positive for constipation. Negative for nausea, abdominal pain and diarrhea.  Genitourinary: Negative for dysuria.  Musculoskeletal: Negative for falls.  Skin: Negative for rash.  Neurological: Negative for loss of consciousness and headaches.  Endo/Heme/Allergies: Negative for polydipsia.  Psychiatric/Behavioral: Negative for depression and suicidal ideas. The patient is not nervous/anxious and does not have insomnia.     Objective  BP 128/82 mmHg  Pulse 53  Temp(Src) 98.3 F (36.8 C) (Oral)  Ht 5' 9.5" (1.765 m)  Wt 181 lb 8 oz (82.328 kg)  BMI 26.43 kg/m2  SpO2 97%  Physical Exam  Physical Exam  Constitutional: He is oriented to person, place, and time and well-developed, well-nourished, and in no distress. No distress.  HENT:  Head: Normocephalic and atraumatic.  Eyes: Conjunctivae are normal.  Neck: Neck supple. No thyromegaly present.  Cardiovascular: Normal rate, regular rhythm and normal heart sounds.   No murmur heard. Pulmonary/Chest: Effort normal and breath sounds normal. No respiratory distress.  Abdominal: He exhibits no distension and no mass. There is no tenderness.  Musculoskeletal: He exhibits no edema.  Neurological: He is alert and oriented to person, place, and time.  Skin: Skin is warm.  Psychiatric: Memory, affect and judgment normal.    Lab Results    Component Value Date   TSH 3.98 01/27/2015   Lab Results  Component Value Date   WBC 8.8 01/27/2015   HGB 14.8 01/27/2015   HCT 43.9 01/27/2015   MCV 87.2 01/27/2015   PLT 218.0 01/27/2015   Lab Results  Component Value Date   CREATININE 1.37 01/27/2015   BUN 21 01/27/2015   NA 138 01/27/2015   K 4.3 01/27/2015   CL 105 01/27/2015   CO2 25 01/27/2015   Lab Results  Component Value Date   ALT 15 01/27/2015   AST 20 01/27/2015   ALKPHOS 42 01/27/2015   BILITOT 0.5 01/27/2015   Lab Results  Component Value Date   CHOL 132 01/27/2015   Lab Results  Component Value Date   HDL 39.00* 01/27/2015   Lab Results  Component Value Date   LDLCALC 69 01/27/2015   Lab Results  Component Value Date   TRIG 121.0 01/27/2015   Lab Results  Component Value Date   CHOLHDL 3 01/27/2015     Assessment & Plan  No problem-specific assessment & plan notes found for this encounter.

## 2015-04-29 NOTE — Patient Instructions (Addendum)
Change probiotics Add a fiber supplements such Benefiber/Citracel/Metamucil followed by fluids once to twice daily Stool softener daily even 2 of them as needed Change to a combo combo pill with stool softener and laxative in one such as Pericolace or SennaS   About Constipation  Constipation Overview Constipation is the most common gastrointestinal complaint - about 4 million Americans experience constipation and make 2.5 million physician visits a year to get help for the problem.  Constipation can occur when the colon absorbs too much water, the colon's muscle contraction is slow or sluggish, and/or there is delayed transit time through the colon.  The result is stool that is hard and dry.  Indicators of constipation include straining during bowel movements greater than 25% of the time, having fewer than three bowel movements per week, and/or the feeling of incomplete evacuation.  There are established guidelines ([place] II ) for defining constipation. A person needs to have two or more of the following symptoms for at least 12 weeks (not necessarily consecutive) in the preceding 12 months: . Straining in  greater than 25% of bowel movements . Lumpy or hard stools in greater than 25% of bowel movements . Sensation of incomplete emptying in greater than 25% of bowel movements . Sensation of anorectal obstruction/blockade in greater than 25% of bowel movements . Manual maneuvers to help empty greater than 25% of bowel movements (e.g., digital evacuation, support of the pelvic floor)  . Less than  3 bowel movements/week . Loose stools are not present, and criteria for irritable bowel syndrome are insufficient  Common Causes of Constipation . Lack of fiber in your diet . Lack of physical activity . Medications, including iron and calcium supplements  . Dairy intake . Dehydration . Abuse of laxatives  Travel  Irritable Bowel Syndrome  Pregnancy  Luteal phase of menstruation (after  ovulation and before menses)  Colorectal problems  Intestinal Dysfunction  Treating Constipation  There are several ways of treating constipation, including changes to diet and exercise, use of laxatives, adjustments to the pelvic floor, and scheduled toileting.  These treatments include: . increasing fiber and fluids in the diet  . increasing physical activity . learning muscle coordination   learning proper toileting techniques and toileting modifications   designing and sticking  to a toileting schedule     2007, [employer] Doc.22  Seborrheic Keratosis Seborrheic keratosis is a common, noncancerous (benign) skin growth that can occur anywhere on the skin.It looks like "stuck-on," waxy, rough, tan, brown, or black spots on the skin. These skin growths can be flat or raised.They are often called "barnacles" because of their pasted-on appearance.Usually, these skin growths appear in adulthood, around age 29, and increase in number as you age. They may also develop during pregnancy or following estrogen therapy. Many people may only have one growth appear in their lifetime, while some people may develop many growths. CAUSES It is unknown what causes these skin growths, but they appear to run in families. SYMPTOMS Seborrheic keratosis is often located on the face, chest, shoulders, back, or other areas. These growths are:  Usually painless, but may become irritated and itchy.  Yellow, brown, black, or other colors.  Slightly raised or have a flat surface.  Sometimes rough or wart-like in texture.  Often waxy on the surface.  Round or oval-shaped.  Sometimes "stuck-on" in appearance.  Sometimes single, but there are usually many growths. Any growth that bleeds, itches on a regular basis, becomes inflamed, or becomes irritated needs  to be evaluated by a skin specialist (dermatologist). DIAGNOSIS Diagnosis is mainly based on the way the growths appear. In some  cases, it can be difficult to tell this type of skin growth from skin cancer. A skin growth tissue sample (biopsy) may be used to confirm the diagnosis. TREATMENT Most often, treatment is not needed because the skin growths are benign.If the skin growth is irritated easily by clothing or jewelry, causing it to scab or bleed, treatment may be recommended. Patients may also choose to have the growths removed because they do not like their appearance. Most commonly, these growths are treated with cryosurgery. In cryosurgery, liquid nitrogen is applied to "freeze" the growth. The growth usually falls off within a matter of days. A blister may form and dry into a scab that will also fall off. After the growth or scab falls off, it may leave a dark or light spot on the skin. This color may fade over time, or it may remain permanent on the skin. HOME CARE INSTRUCTIONS If the skin growths are treated with cryosurgery, the treated area needs to be kept clean with water and soap. SEEK MEDICAL CARE IF:  You have questions about these growths or other skin problems.  You develop new symptoms, including:  A change in the appearance of the skin growth.  New growths.  Any bleeding, itching, or pain in the growths.  A skin growth that looks similar to seborrheic keratosis. Document Released: 11/25/2010 Document Revised: 01/15/2012 Document Reviewed: 11/25/2010 [facility] Patient Information 2015 [place], [place]. This information is not intended to replace advice given to you by your health care provider. Make sure you discuss any questions you have with your health care provider.

## 2015-04-29 NOTE — Progress Notes (Signed)
Pre visit review using our clinic review tool, if applicable. No additional management support is needed unless otherwise documented below in the visit note. 

## 2015-05-12 ENCOUNTER — Encounter: Payer: Self-pay | Admitting: Family Medicine

## 2015-05-12 DIAGNOSIS — K219 Gastro-esophageal reflux disease without esophagitis: Secondary | ICD-10-CM | POA: Insufficient documentation

## 2015-05-12 DIAGNOSIS — L989 Disorder of the skin and subcutaneous tissue, unspecified: Secondary | ICD-10-CM | POA: Insufficient documentation

## 2015-05-12 DIAGNOSIS — G4700 Insomnia, unspecified: Secondary | ICD-10-CM

## 2015-05-12 HISTORY — DX: Insomnia, unspecified: G47.00

## 2015-05-12 NOTE — Assessment & Plan Note (Signed)
Referred to dermatology for further consideration. Minimize sun exposure.

## 2015-05-12 NOTE — Assessment & Plan Note (Signed)
hgba1c acceptable, minimize simple carbs. Increase exercise as tolerated. Continue current meds 

## 2015-05-12 NOTE — Assessment & Plan Note (Signed)
Tolerating statin, encouraged heart healthy diet, avoid trans fats, minimize simple carbs and saturated fats. Increase exercise as tolerated 

## 2015-05-12 NOTE — Assessment & Plan Note (Signed)
Well controlled, no changes to meds. Encouraged heart healthy diet such as the DASH diet and exercise as tolerated.  °

## 2015-05-12 NOTE — Assessment & Plan Note (Signed)
Encouraged good sleep hygiene such as dark, quiet room. No blue/green glowing lights such as computer screens in bedroom. No alcohol or stimulants in evening. Cut down on caffeine as able. Regular exercise is helpful but not just prior to bed time.  

## 2015-05-12 NOTE — Assessment & Plan Note (Signed)
Encouraged increased hydration and fiber in diet. Daily probiotics. If bowels not moving can use MOM 2 tbls po in 4 oz of warm prune juice by mouth every 2-3 days. If no results then repeat in 4 hours with  Dulcolax suppository pr, may repeat again in 4 more hours as needed. Seek care if symptoms worsen. Consider daily Miralax and/or Dulcolax if symptoms persist.  

## 2015-06-17 ENCOUNTER — Telehealth: Payer: Self-pay | Admitting: Family Medicine

## 2015-06-17 NOTE — Telephone Encounter (Signed)
Pt wife, York Cerise, called to see if Dr. Charlett Blake will refer pt for back pain. They've been doubling up pain meds and unfortunately pt is still hurting. She would like to see a specialist. Please call with concerns.

## 2015-06-18 ENCOUNTER — Other Ambulatory Visit: Payer: Self-pay | Admitting: Family Medicine

## 2015-06-18 DIAGNOSIS — M5441 Lumbago with sciatica, right side: Secondary | ICD-10-CM

## 2015-06-28 DIAGNOSIS — M5136 Other intervertebral disc degeneration, lumbar region: Secondary | ICD-10-CM | POA: Diagnosis not present

## 2015-06-28 DIAGNOSIS — M545 Low back pain: Secondary | ICD-10-CM | POA: Diagnosis not present

## 2015-06-30 ENCOUNTER — Encounter: Payer: Self-pay | Admitting: Physical Therapy

## 2015-06-30 ENCOUNTER — Ambulatory Visit (INDEPENDENT_AMBULATORY_CARE_PROVIDER_SITE_OTHER): Payer: Commercial Managed Care - HMO | Admitting: Physical Therapy

## 2015-06-30 DIAGNOSIS — M6281 Muscle weakness (generalized): Secondary | ICD-10-CM

## 2015-06-30 DIAGNOSIS — M545 Low back pain, unspecified: Secondary | ICD-10-CM

## 2015-06-30 DIAGNOSIS — R29898 Other symptoms and signs involving the musculoskeletal system: Secondary | ICD-10-CM

## 2015-06-30 DIAGNOSIS — R293 Abnormal posture: Secondary | ICD-10-CM | POA: Diagnosis not present

## 2015-06-30 NOTE — Patient Instructions (Signed)
(  Home) Flexion: Pelvic Tilt   Lie with neck supported, knees bent, feet flat. Tighten and suck stomach in, pushing back down against surface. Do not push down with legs. Repeat _10___ times per set. Do _1-2___ sets per session. Do __3-5__ sessions per week. Knee to Chest   Lying supine, bend involved knee to chest _2__ times. Hold 20-30 sec. Repeat with other leg. Do _1-2__ times per day.   Supine With Rotation   Lie, back flat, legs bent, feet together. Rotate knees to one side. Hold _15__ seconds. Repeat to other side. Repeat _2-3__ times per session. Do _1-2__ sessions per day.  [facility] at [facility] [place] [address] [place], [place] [address]  [phone] (office) [phone] (fax)

## 2015-06-30 NOTE — Therapy (Signed)
[facility] [place] [place], [place], [address] Phone: [phone]   Fax:  [phone]  Physical Therapy Evaluation  Patient Details  Name: Troy Ray MRN: [id number] Date of Birth: May 21, 1928 Referring Provider:  Suella Broad, MD  Encounter Date: 06/30/2015      PT End of Session - 06/30/15 1156    Visit Number 1   Number of Visits 8   Date for PT Re-Evaluation 07/28/15   PT Start Time 1109   PT Stop Time 1207   PT Time Calculation (min) 58 min   Activity Tolerance Patient tolerated treatment well      Past Medical History  Diagnosis Date  . CAD (coronary artery disease)   . Hypertension   . Chronic kidney disease     chronic, stage II  . Hyperlipidemia   . PVD (peripheral vascular disease)   . Postural lightheadedness   . Memory loss   . Dysphagia   . Esophageal stricture     Dr [name]  . CRI (chronic renal insufficiency)   . Sensorineural hearing loss, bilateral   . Carotid stenosis, bilateral     right 60-79% stenosed.  Left 40-59%  . Adenomatous colon polyp   . Diverticulosis   . Unspecified constipation 12/29/2012  . BPH (benign prostatic hyperplasia) 07/14/2013  . Headache(784.0) 12/01/2013  . Glaucoma   . Medicare annual wellness visit, subsequent 02/04/2015  . Insomnia 05/12/2015    Past Surgical History  Procedure Laterality Date  . Hernia repair  03/23/05    left inguinal Lichtenstein repair, with mesh.  [name]  . Abdominal aortic aneurysm repair  1996    Dr [name]  . Renal artery stent  1996    Dr [name].  Bilateral stenting  . Coronary artery bypass graft    . Eye surgery      cataracts b/l and laser    There were no vitals filed for this visit.  Visit Diagnosis:  Left-sided low back pain without sciatica - Plan: PT plan of care cert/re-cert  Weakness of back - Plan: PT plan of care cert/re-cert  Abnormal posture - Plan: PT plan of care cert/re-cert       Subjective Assessment - 06/30/15 1109    Subjective Pt reports low back pain on the Lt side that is different from what he usually has. It began around 05/12/15 while playing ball with his grandchildren. The pain is intermittent, worse in the morning and then loosens up some during the day.    Pertinent History pt reports he has always been able to manage his back pain until just recently with this episode.    How long can you sit comfortably? not limited   How long can you walk comfortably? pain doesn't limit   Diagnostic tests not for this occurrence   Patient Stated Goals get rid of the hurt.    Currently in Pain? Yes   Pain Score 4   10/10 first thing in the AM   Pain Location Back   Pain Orientation Left  above the waist band laterally.    Pain Descriptors / Indicators Aching   Pain Type Acute pain   Pain Frequency Constant   Aggravating Factors  first thing in AM, reaching over the  right side   Pain Relieving Factors relaxing, heat            [facility] PT Assessment - 06/30/15 0001    Assessment   Medical Diagnosis low back  pain   Onset Date/Surgical Date 05/12/15   Next MD Visit not known   Prior Therapy no   Precautions   Precautions None   Restrictions   Weight Bearing Restrictions No   Balance Screen   Has the patient fallen in the past 6 months No   Has the patient had a decrease in activity level because of a fear of falling?  No   Is the patient reluctant to leave their home because of a fear of falling?  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 , driving and walking   Leisure watch sports, play with great grand children   Observation/Other Assessments   Focus on Therapeutic Outcomes (FOTO)  60% limited   Other Surveys  --  very hard of hearing, has hearing aide   Posture/Postural Control    Posture/Postural Control Postural limitations   Postural Limitations Rounded Shoulders;Forward head;Flexed trunk;Increased thoracic kyphosis  extra abdominal girth   ROM / Strength   AROM / PROM / Strength AROM;Strength   AROM   AROM Assessment Site Lumbar   Lumbar Flexion lower shin   Lumbar Extension WFL   Lumbar - Right Rotation WFL  some Lt side pain   Lumbar - Left Rotation WFL   Strength   Overall Strength Comments bilat hips grossly 5-/5   Strength Assessment Site --  multifidis and TA poor    Flexibility   Soft Tissue Assessment /Muscle Length --  tightness through bilat hips   Palpation   Spinal mobility NA   Palpation comment very tight and tender in Lt lumbar paraspinals and QL   Special Tests    Special Tests --  NA   Ambulation/Gait   Gait Pattern --  flexed posture, staggers to the Lt.                    [facility] Adult PT Treatment/Exercise - 06/30/15 0001    Self-Care   Self-Care Heat/Ice Application   Heat/Ice Application Educated pt regarding benefits and parameters of use of heat at home.    Exercises   Exercises Knee/Hip;Lumbar   Lumbar Exercises: Stretches   Single Knee to Chest Stretch 2 reps;30 seconds  each leg   Lower Trunk Rotation 3 reps;20 seconds  each direction    Pelvic Tilt 5 reps   Pelvic Tilt Limitations required multiple tactile VC before pt had correct form. Ant pelvic tilt increased LBP, kept exercise to posterior pelvic tilt to neutral.    Modalities   Modalities Electrical Stimulation;Moist Heat   Moist Heat Therapy   Number Minutes Moist Heat 15 Minutes   Moist Heat Location Lumbar Spine   Electrical Stimulation   Electrical Stimulation Location Lt lumbar paraspinals/QL   Electrical Stimulation Action IFC   Electrical Stimulation Parameters to tolerance, x 15 min    Electrical Stimulation Goals Pain                PT Education - 06/30/15 1525    Education provided Yes   Education Details HEP   Person(s)  Educated Patient   Methods Explanation;Handout   Comprehension Returned demonstration;Verbal cues required             PT Long Term Goals - 06/30/15 1713    PT LONG TERM GOAL #1   Title I  with advanced HEP ( 07/28/15)   Time 4   Period Weeks   Status New   PT LONG TERM GOAL #2   Title report pain decreased =/> 75% first thing in the AM ( 07/28/15)    Time 4   Period Weeks   Status New   PT LONG TERM GOAL #3   Title perform a walking program ( 07/28/15)    Time 4   Period Weeks   Status New   PT LONG TERM GOAL #4   Title improve FOTO =/< CL level (42% limited)   Time 4   Period Weeks   Status New               Plan - 06/30/15 1710    Clinical Impression Statement 81 yo male presents with low back pain from an acute incident.  He has a h/o LPB that had been managable until this epidsode.  Now he has tightness and tenderness in the Lt QL and lumbar paraspinals that make it difficulty for him to move around.  He is worse in the AM and a little better once he starts moving around.  Pt appears unstable with walking, her reports no falls though.    Pt will benefit from skilled therapeutic intervention in order to improve on the following deficits Postural dysfunction;Decreased strength;Pain;Increased muscle spasms;Decreased range of motion   Rehab Potential Good   PT Frequency 2x / week   PT Duration 4 weeks   PT Treatment/Interventions Ultrasound;Balance training;Neuromuscular re-education;Patient/family education;Stair training;Cryotherapy;Electrical Stimulation;Moist Heat;Therapeutic exercise;Manual techniques   PT Next Visit Plan progress core ex and STW PRN   Consulted and Agree with Plan of Care Patient         Problem List Patient Active Problem List   Diagnosis Date Noted  . Skin lesion of left arm 05/12/2015  . Gastroesophageal reflux disease without esophagitis 05/12/2015  . Insomnia 05/12/2015  . Medicare annual wellness visit, subsequent 02/04/2015  .  Glaucoma 01/25/2015  . CKD (chronic kidney disease) stage 3, GFR 30-59 ml/min 08/21/2014  . CAP (community acquired pneumonia) 08/20/2014  . Pneumonia 08/20/2014  . Low back pain with right-sided sciatica 04/03/2014  . Apnea 01/25/2014  . Headache(784.0) 12/01/2013  . Sinusitis 10/13/2013  . BPH (benign prostatic hyperplasia) 07/14/2013  . Constipation 12/29/2012  . LACK OF COORDINATION 11/17/2010  . HYPERGLYCEMIA 11/17/2010  . PERSONAL HISTORY OF COLONIC POLYPS 09/21/2009  . CAROTID ARTERY STENOSIS, WITHOUT INFARCTION 05/11/2009  . POSTURAL LIGHTHEADEDNESS 04/06/2009  . MEMORY LOSS 04/06/2009  . Hyperlipidemia 12/07/2008  . Essential hypertension 12/07/2008  . Coronary atherosclerosis 12/07/2008  . History of AAA (abdominal aortic aneurysm) repair 12/04/2008  . CHRONIC KIDNEY DISEASE STAGE II (MILD) 12/04/2008  . ESOPHAGEAL STRICTURE 05/29/2008  . OTHER DYSPHAGIA 05/29/2008    Manuela Schwartz shaver PT 06/30/2015, 5:18 PM  [facility] [place] [address], [place], [address] Phone: [phone]   Fax:  [phone]

## 2015-07-02 ENCOUNTER — Ambulatory Visit (INDEPENDENT_AMBULATORY_CARE_PROVIDER_SITE_OTHER): Payer: Commercial Managed Care - HMO | Admitting: Rehabilitative and Restorative Service Providers"

## 2015-07-02 ENCOUNTER — Encounter: Payer: Self-pay | Admitting: Rehabilitative and Restorative Service Providers"

## 2015-07-02 DIAGNOSIS — R293 Abnormal posture: Secondary | ICD-10-CM

## 2015-07-02 DIAGNOSIS — R29898 Other symptoms and signs involving the musculoskeletal system: Secondary | ICD-10-CM

## 2015-07-02 DIAGNOSIS — M6281 Muscle weakness (generalized): Secondary | ICD-10-CM

## 2015-07-02 DIAGNOSIS — M545 Low back pain, unspecified: Secondary | ICD-10-CM

## 2015-07-02 NOTE — Therapy (Signed)
[address] [place], [place], [address] Phone: [phone]   Fax:  [phone]  Physical Therapy Treatment  Patient Details  Name: Troy Ray MRN: [id number] Date of Birth: 1928-01-17 Referring Provider:  Suella Broad, MD  Encounter Date: 07/02/2015      PT End of Session - 07/02/15 1550    Visit Number 2   Number of Visits 8   Date for PT Re-Evaluation 07/28/15   PT Start Time 4259   PT Stop Time 1550   PT Time Calculation (min) 63 min   Activity Tolerance Patient tolerated treatment well      Past Medical History  Diagnosis Date  . CAD (coronary artery disease)   . Hypertension   . Chronic kidney disease     chronic, stage II  . Hyperlipidemia   . PVD (peripheral vascular disease)   . Postural lightheadedness   . Memory loss   . Dysphagia   . Esophageal stricture     Dr [name]  . CRI (chronic renal insufficiency)   . Sensorineural hearing loss, bilateral   . Carotid stenosis, bilateral     right 60-79% stenosed.  Left 40-59%  . Adenomatous colon polyp   . Diverticulosis   . Unspecified constipation 12/29/2012  . BPH (benign prostatic hyperplasia) 07/14/2013  . Headache(784.0) 12/01/2013  . Glaucoma   . Medicare annual wellness visit, subsequent 02/04/2015  . Insomnia 05/12/2015    Past Surgical History  Procedure Laterality Date  . Hernia repair  03/23/05    left inguinal Lichtenstein repair, with mesh.  [name]  . Abdominal aortic aneurysm repair  1996    Dr [name]  . Renal artery stent  1996    Dr [name].  Bilateral stenting  . Coronary artery bypass graft    . Eye surgery      cataracts b/l and laser    There were no vitals filed for this visit.  Visit Diagnosis:  Left-sided low back pain without sciatica  Weakness of back  Abnormal posture      Subjective Assessment - 07/02/15 1457    Subjective Electrical stim felt good. Tried to do the exercises at home.  Still having pain.   Pain Score 5    Pain Location Back   Pain Orientation Left   Pain Descriptors / Indicators Aching   Pain Frequency Constant                         [facility] Adult PT Treatment/Exercise - 07/02/15 0001    Lumbar Exercises: Stretches   Single Knee to Chest Stretch 20 seconds  alternate   Lower Trunk Rotation 5 reps;20 seconds  each direction    Lumbar Exercises: Supine   Ab Set 10 reps;2 seconds   AB Set Limitations tried to tighten abs sucking belly button to back bone   Modalities   Modalities Electrical Stimulation;Moist Heat   Cryotherapy   Number Minutes Cryotherapy 15 Minutes   Cryotherapy Location Lumbar Spine   Type of Cryotherapy Ice pack   Electrical Stimulation   Electrical Stimulation Location Lt lumbar paraspinals/QL   Electrical Stimulation Action IFC   Electrical Stimulation Parameters to tolerance   Electrical Stimulation Goals Pain   Manual Therapy   Manual therapy comments patient prone with pillow under trunk   Soft tissue mobilization working through [employer] lumbar paraspinals/QL/lats   Myofascial Release lumbar to Lt side  PT Education - 07/02/15 1548    Education provided Yes   Education Details reviewed HEP attempted to add core stabilization - patient will work on abdominal tightening   Person(s) Educated Patient   Methods Explanation;Demonstration;Tactile cues;Verbal cues;Handout   Comprehension Verbalized understanding;Returned demonstration;Verbal cues required;Tactile cues required;Need further instruction             PT Long Term Goals - 06/30/15 1713    PT LONG TERM GOAL #1   Title I with advanced HEP ( 07/28/15)   Time 4   Period Weeks   Status New   PT LONG TERM GOAL #2   Title report pain decreased =/> 75% first thing in the AM ( 07/28/15)    Time 4   Period Weeks   Status New   PT LONG TERM GOAL #3   Title perform a walking program ( 07/28/15)    Time 4   Period Weeks    Status New   PT LONG TERM GOAL #4   Title improve FOTO =/< CL level (42% limited)   Time 4   Period Weeks   Status New               Plan - 07/02/15 1550    Clinical Impression Statement Liked the electrical stimulation and wants to try ice. Several people have told him that it works better than heat. Tolerated prone position for manual work without difficulty. Noted significant muscular tihtness through lumbar paraspinals, QL and lats on Lt. Has difficutly with correct techniques for exercises    Pt will benefit from skilled therapeutic intervention in order to improve on the following deficits Postural dysfunction;Decreased strength;Pain;Increased muscle spasms;Decreased range of motion   Rehab Potential Good   PT Frequency 2x / week   PT Duration 4 weeks   PT Treatment/Interventions Ultrasound;Balance training;Neuromuscular re-education;Patient/family education;Stair training;Cryotherapy;Electrical Stimulation;Moist Heat;Therapeutic exercise;Manual techniques   PT Next Visit Plan progress core ex and STW PRN   PT Home Exercise Plan HEP attempting one part of 3 part core - engaging abdominals. Will try ice for home    Consulted and Agree with Plan of Care Patient        Problem List Patient Active Problem List   Diagnosis Date Noted  . Skin lesion of left arm 05/12/2015  . Gastroesophageal reflux disease without esophagitis 05/12/2015  . Insomnia 05/12/2015  . Medicare annual wellness visit, subsequent 02/04/2015  . Glaucoma 01/25/2015  . CKD (chronic kidney disease) stage 3, GFR 30-59 ml/min 08/21/2014  . CAP (community acquired pneumonia) 08/20/2014  . Pneumonia 08/20/2014  . Low back pain with right-sided sciatica 04/03/2014  . Apnea 01/25/2014  . Headache(784.0) 12/01/2013  . Sinusitis 10/13/2013  . BPH (benign prostatic hyperplasia) 07/14/2013  . Constipation 12/29/2012  . LACK OF COORDINATION 11/17/2010  . HYPERGLYCEMIA 11/17/2010  . PERSONAL HISTORY OF  COLONIC POLYPS 09/21/2009  . CAROTID ARTERY STENOSIS, WITHOUT INFARCTION 05/11/2009  . POSTURAL LIGHTHEADEDNESS 04/06/2009  . MEMORY LOSS 04/06/2009  . Hyperlipidemia 12/07/2008  . Essential hypertension 12/07/2008  . Coronary atherosclerosis 12/07/2008  . History of AAA (abdominal aortic aneurysm) repair 12/04/2008  . CHRONIC KIDNEY DISEASE STAGE II (MILD) 12/04/2008  . ESOPHAGEAL STRICTURE 05/29/2008  . OTHER DYSPHAGIA 05/29/2008    Jovana Rembold Nilda Simmer, PT, MPH 07/02/2015, 4:03 PM  [facility] [place], [place], [address] Phone: [phone]   Fax:  [phone]

## 2015-07-02 NOTE — Patient Instructions (Signed)
Abdominal Bracing With Pelvic Floor (Hook-Lying)   Tighten abdomen by sucking belly button to your back bone. Hold 10 sec without holding your breath!. Repeat __10_ times. Do _2-3__ times a day.

## 2015-07-05 ENCOUNTER — Ambulatory Visit: Payer: Commercial Managed Care - HMO | Admitting: Physical Therapy

## 2015-07-05 ENCOUNTER — Encounter: Payer: Self-pay | Admitting: Rehabilitative and Restorative Service Providers"

## 2015-07-05 ENCOUNTER — Ambulatory Visit (INDEPENDENT_AMBULATORY_CARE_PROVIDER_SITE_OTHER): Payer: Commercial Managed Care - HMO | Admitting: Rehabilitative and Restorative Service Providers"

## 2015-07-05 DIAGNOSIS — M545 Low back pain, unspecified: Secondary | ICD-10-CM

## 2015-07-05 DIAGNOSIS — R29898 Other symptoms and signs involving the musculoskeletal system: Secondary | ICD-10-CM

## 2015-07-05 DIAGNOSIS — M6281 Muscle weakness (generalized): Secondary | ICD-10-CM

## 2015-07-05 DIAGNOSIS — R293 Abnormal posture: Secondary | ICD-10-CM | POA: Diagnosis not present

## 2015-07-05 NOTE — Patient Instructions (Addendum)
Side Bend, Sitting   Sit with feet on the floor. Bend  to right side, Hold _2 minutes. To increase stretch, raise other arm above head.  Repeat _2-3__ times per session. Do __3-4_ sessions per day.   Double Knee to Chest (Flexion)   Gently pull both knees toward chest. Feel stretch in lower back or buttock area. Breathing deeply, Hold __20__ seconds. Repeat _5-10___ times. Do _3-4__ sessions per day.

## 2015-07-05 NOTE — Therapy (Signed)
[place], [place], [address] Phone: [phone]   Fax:  [phone]  Physical Therapy Treatment  Patient Details  Name: Troy Ray MRN: [id number] Date of Birth: [birth year] Referring Provider:  Suella Broad, MD  Encounter Date: 07/05/2015      PT End of Session - 07/05/15 1527    Visit Number 3   Number of Visits 8   Date for PT Re-Evaluation 07/28/15   PT Start Time 5573   PT Stop Time 1533   PT Time Calculation (min) 46 min   Activity Tolerance Patient tolerated treatment well      Past Medical History  Diagnosis Date  . CAD (coronary artery disease)   . Hypertension   . Chronic kidney disease     chronic, stage II  . Hyperlipidemia   . PVD (peripheral vascular disease)   . Postural lightheadedness   . Memory loss   . Dysphagia   . Esophageal stricture     Dr [name]  . CRI (chronic renal insufficiency)   . Sensorineural hearing loss, bilateral   . Carotid stenosis, bilateral     right 60-79% stenosed.  Left 40-59%  . Adenomatous colon polyp   . Diverticulosis   . Unspecified constipation 12/29/2012  . BPH (benign prostatic hyperplasia) 07/14/2013  . Headache(784.0) 12/01/2013  . Glaucoma   . Medicare annual wellness visit, subsequent 02/04/2015  . Insomnia 05/12/2015    Past Surgical History  Procedure Laterality Date  . Hernia repair  03/23/05    left inguinal Lichtenstein repair, with mesh.  [name]  . Abdominal aortic aneurysm repair  1996    Dr [name]  . Renal artery stent  1996    Dr [name].  Bilateral stenting  . Coronary artery bypass graft    . Eye surgery      cataracts b/l and laser    There were no vitals filed for this visit.  Visit Diagnosis:  Left-sided low back pain without sciatica  Weakness of back  Abnormal posture      Subjective Assessment - 07/05/15 1435    Subjective Patient reports that he continues to have LBP - which comes  and goes. No problem sleeping but has pain when he gets up in the morning; feel sbetter during the day. Plans to try to walk at least 4 days/week. Has another appt with Dr. Nelva Bush in a few weeks.    Currently in Pain? Yes   Pain Score 2    Pain Location Back   Pain Orientation Left   Pain Descriptors / Indicators Aching   Pain Type Acute pain            [facility] PT Assessment - 07/05/15 0001    Palpation   Palpation comment very tight and tender in Lt lumbar paraspinals and QL   Bed Mobility   Bed Mobility --  difficulty rolling and turning; coming to sit   Ambulation/Gait   Gait Pattern --  fwd flexed posture; looses balance to Rt w/ amb in clinic                     [facility] Adult PT Treatment/Exercise - 07/05/15 0001    Self-Care   Self-Care --  discussed ex/activtiy level with wife offered suggestions    Exercises   Exercises --  QL stretch sitting w/lat trunk flexion to Rt 2 min hold x2    Lumbar Exercises: Stretches   Single  Knee to Chest Stretch 20 seconds;5 reps  alternate   Double Knee to Chest Stretch 5 reps;20 seconds   Lower Trunk Rotation 5 reps;20 seconds  each direction    Lumbar Exercises: Aerobic   Tread Mill walking around gym SBA 5 laps patient lost balance to Rt one 3-4 occassions but was able to regain balance independently   Lumbar Exercises: Supine   Ab Set 5 reps;2 seconds   AB Set Limitations tried to tighten abs sucking belly button to back bone   Knee/Hip Exercises: Stretches   Other Knee/Hip Stretches lateral trunk flexion stretch sitting leaning to Rt over pillows 2 min hold 2 reps    Modalities   Modalities Electrical Stimulation;Moist Heat   Cryotherapy   Number Minutes Cryotherapy 15 Minutes   Cryotherapy Location Lumbar Spine   Type of Cryotherapy Ice pack   Electrical Stimulation   Electrical Stimulation Location Lt lumbar paraspinals/QL   Electrical Stimulation Action IFC   Electrical Stimulation Parameters to tolerance    Electrical Stimulation Goals Pain   Manual Therapy   Manual therapy comments soft tissue work with patient sitting trunk lateral flexed over pillow    Soft tissue mobilization working through [employer] lumbar paraspinals/QL/lats                PT Education - 07/05/15 1507    Education provided Yes   Education Details reviewed HEP added lateral trunk flexioin in sitting and double knee to chest supine   Person(s) Educated Patient   Methods Explanation;Demonstration;Tactile cues;Verbal cues;Handout   Comprehension Verbalized understanding;Returned demonstration;Verbal cues required;Tactile cues required             PT Long Term Goals - 07/05/15 1542    PT LONG TERM GOAL #1   Title I with advanced HEP ( 07/28/15)   Time 4   Period Weeks   Status On-going   PT LONG TERM GOAL #2   Title report pain decreased =/> 75% first thing in the AM ( 07/28/15)    Time 4   Period Weeks   Status On-going   PT LONG TERM GOAL #3   Title perform a walking program ( 07/28/15)    Time 4   Period Weeks   Status On-going               Plan - 07/05/15 1527    Clinical Impression Statement Continued pain - discussed activity level with patient's wife. She states that he sits much of the day. He has a firmer chair than he used to have but it is still a recliner. He also has purchased a bed since he started to experience LBP. Wife will work on modifying patient's position fro home and his activity level.    Pt will benefit from skilled therapeutic intervention in order to improve on the following deficits Postural dysfunction;Decreased strength;Pain;Increased muscle spasms;Decreased range of motion   Rehab Potential Good   PT Frequency 2x / week   PT Duration 4 weeks   PT Treatment/Interventions Ultrasound;Balance training;Neuromuscular re-education;Patient/family education;Stair training;Cryotherapy;Electrical Stimulation;Moist Heat;Therapeutic exercise;Manual techniques   PT Next Visit Plan  progress QL stretching, core ex and STW PRN   PT Home Exercise Plan Added lateral trunk flexion stretch in sitting; double knee to chest; discussed home modificatioins for chair with wife; suggested patient stand and/or walk at least short distances several times per day.    Consulted and Agree with Plan of Care Patient        Problem List Patient Active  Problem List   Diagnosis Date Noted  . Skin lesion of left arm 05/12/2015  . Gastroesophageal reflux disease without esophagitis 05/12/2015  . Insomnia 05/12/2015  . Medicare annual wellness visit, subsequent 02/04/2015  . Glaucoma 01/25/2015  . CKD (chronic kidney disease) stage 3, GFR 30-59 ml/min 08/21/2014  . CAP (community acquired pneumonia) 08/20/2014  . Pneumonia 08/20/2014  . Low back pain with right-sided sciatica 04/03/2014  . Apnea 01/25/2014  . Headache(784.0) 12/01/2013  . Sinusitis 10/13/2013  . BPH (benign prostatic hyperplasia) 07/14/2013  . Constipation 12/29/2012  . LACK OF COORDINATION 11/17/2010  . HYPERGLYCEMIA 11/17/2010  . PERSONAL HISTORY OF COLONIC POLYPS 09/21/2009  . CAROTID ARTERY STENOSIS, WITHOUT INFARCTION 05/11/2009  . POSTURAL LIGHTHEADEDNESS 04/06/2009  . MEMORY LOSS 04/06/2009  . Hyperlipidemia 12/07/2008  . Essential hypertension 12/07/2008  . Coronary atherosclerosis 12/07/2008  . History of AAA (abdominal aortic aneurysm) repair 12/04/2008  . CHRONIC KIDNEY DISEASE STAGE II (MILD) 12/04/2008  . ESOPHAGEAL STRICTURE 05/29/2008  . OTHER DYSPHAGIA 05/29/2008    Ziyonna Christner Nilda Simmer PT, MPH 07/05/2015, 3:46 PM  [facility] [place], [place], [address] Phone: [phone]   Fax:  [phone]

## 2015-07-07 ENCOUNTER — Ambulatory Visit (INDEPENDENT_AMBULATORY_CARE_PROVIDER_SITE_OTHER): Payer: Commercial Managed Care - HMO | Admitting: Physical Therapy

## 2015-07-07 ENCOUNTER — Ambulatory Visit: Payer: Commercial Managed Care - HMO | Admitting: Physical Therapy

## 2015-07-07 DIAGNOSIS — M545 Low back pain, unspecified: Secondary | ICD-10-CM

## 2015-07-07 DIAGNOSIS — M6281 Muscle weakness (generalized): Secondary | ICD-10-CM

## 2015-07-07 DIAGNOSIS — R29898 Other symptoms and signs involving the musculoskeletal system: Secondary | ICD-10-CM

## 2015-07-07 DIAGNOSIS — R293 Abnormal posture: Secondary | ICD-10-CM

## 2015-07-07 NOTE — Therapy (Signed)
[place], [place], [address] Phone: [phone]   Fax:  [phone]  Physical Therapy Treatment  Patient Details  Name: Troy Ray MRN: [id number] Date of Birth: [birth year] Referring Provider:  Suella Broad, MD  Encounter Date: 07/07/2015      PT End of Session - 07/07/15 1514    Visit Number 4   Number of Visits 8   Date for PT Re-Evaluation 07/28/15   PT Start Time 0973   PT Stop Time 1611   PT Time Calculation (min) 60 min   Activity Tolerance Patient tolerated treatment well      Past Medical History  Diagnosis Date  . CAD (coronary artery disease)   . Hypertension   . Chronic kidney disease     chronic, stage II  . Hyperlipidemia   . PVD (peripheral vascular disease)   . Postural lightheadedness   . Memory loss   . Dysphagia   . Esophageal stricture     Dr [name]  . CRI (chronic renal insufficiency)   . Sensorineural hearing loss, bilateral   . Carotid stenosis, bilateral     right 60-79% stenosed.  Left 40-59%  . Adenomatous colon polyp   . Diverticulosis   . Unspecified constipation 12/29/2012  . BPH (benign prostatic hyperplasia) 07/14/2013  . Headache(784.0) 12/01/2013  . Glaucoma   . Medicare annual wellness visit, subsequent 02/04/2015  . Insomnia 05/12/2015    Past Surgical History  Procedure Laterality Date  . Hernia repair  03/23/05    left inguinal Lichtenstein repair, with mesh.  [name]  . Abdominal aortic aneurysm repair  1996    Dr [name]  . Renal artery stent  1996    Dr [name].  Bilateral stenting  . Coronary artery bypass graft    . Eye surgery      cataracts b/l and laser    There were no vitals filed for this visit.  Visit Diagnosis:  Left-sided low back pain without sciatica  Weakness of back  Abnormal posture      Subjective Assessment - 07/07/15 1517    Subjective Pt reports he wasn't able to exercise twice yesterday because  he was sore all over, he only did it in the AM.  He is feeling better today.    Currently in Pain? Yes   Pain Score 3    Pain Location Back   Pain Orientation Left   Pain Descriptors / Indicators Aching   Pain Type Acute pain   Pain Frequency Constant             [facility] Adult PT Treatment/Exercise - 07/07/15 0001    Lumbar Exercises: Stretches   Single Knee to Chest Stretch 20 seconds;5 reps  alternate   Lower Trunk Rotation 4 reps;10 seconds  each side   Lumbar Exercises: Aerobic   Stationary Bike Nustep L4x5'   Lumbar Exercises: Supine   Bridge 10 reps   Isometric Hip Flexion 5 reps;5 seconds  each leg   Other Supine Lumbar Exercises thoracic lifts x 10 with lots of VC   Other Supine Lumbar Exercises 10x5sec leg presses each side   Lumbar Exercises: Prone   Straight Leg Raise 10 reps  each side   Other Prone Lumbar Exercises 10 reps pelvic presses   Knee/Hip Exercises: Stretches   Passive Hamstring Stretch Right;Left;60 seconds;2 reps   Piriformis Stretch Right;Left;30 seconds;1 rep   Modalities   Modalities Electrical Stimulation;Moist Heat   Moist  Heat Therapy   Number Minutes Moist Heat 15 Minutes   Moist Heat Location Lumbar Spine   Electrical Stimulation   Electrical Stimulation Location Lt lumbar paraspinals/QL   Electrical Stimulation Action IFC   Electrical Stimulation Parameters to tolerance x 15 min    Electrical Stimulation Goals Pain   Manual Therapy   Manual therapy comments soft tissue work with patient sitting trunk lateral flexed over pillow    Soft tissue mobilization working through [employer] lumbar paraspinals/QL/lats                     PT Long Term Goals - 07/05/15 1542    PT LONG TERM GOAL #1   Title I with advanced HEP ( 07/28/15)   Time 4   Period Weeks   Status On-going   PT LONG TERM GOAL #2   Title report pain decreased =/> 75% first thing in the AM ( 07/28/15)    Time 4   Period Weeks   Status On-going   PT LONG TERM GOAL  #3   Title perform a walking program ( 07/28/15)    Time 4   Period Weeks   Status On-going               Plan - 07/07/15 1557    Clinical Impression Statement Pt tolerated exercises with minimal increase in pain.  Pt demo tight hamstrings with passive stretch and reported point tenderness with manual work to [employer] and lumbar paraspinals. Pt working towards LTG # 3 with walking with cane.  No goals met yet.    Pt will benefit from skilled therapeutic intervention in order to improve on the following deficits Postural dysfunction;Decreased strength;Pain;Increased muscle spasms;Decreased range of motion   Rehab Potential Good   PT Frequency 2x / week   PT Duration 4 weeks   PT Treatment/Interventions Ultrasound;Balance training;Neuromuscular re-education;Patient/family education;Stair training;Cryotherapy;Electrical Stimulation;Moist Heat;Therapeutic exercise;Manual techniques   PT Next Visit Plan progress QL stretching, core ex and STW PRN   Consulted and Agree with Plan of Care Patient        Problem List Patient Active Problem List   Diagnosis Date Noted  . Skin lesion of left arm 05/12/2015  . Gastroesophageal reflux disease without esophagitis 05/12/2015  . Insomnia 05/12/2015  . Medicare annual wellness visit, subsequent 02/04/2015  . Glaucoma 01/25/2015  . CKD (chronic kidney disease) stage 3, GFR 30-59 ml/min 08/21/2014  . CAP (community acquired pneumonia) 08/20/2014  . Pneumonia 08/20/2014  . Low back pain with right-sided sciatica 04/03/2014  . Apnea 01/25/2014  . Headache(784.0) 12/01/2013  . Sinusitis 10/13/2013  . BPH (benign prostatic hyperplasia) 07/14/2013  . Constipation 12/29/2012  . LACK OF COORDINATION 11/17/2010  . HYPERGLYCEMIA 11/17/2010  . PERSONAL HISTORY OF COLONIC POLYPS 09/21/2009  . CAROTID ARTERY STENOSIS, WITHOUT INFARCTION 05/11/2009  . POSTURAL LIGHTHEADEDNESS 04/06/2009  . MEMORY LOSS 04/06/2009  . Hyperlipidemia 12/07/2008  .  Essential hypertension 12/07/2008  . Coronary atherosclerosis 12/07/2008  . History of AAA (abdominal aortic aneurysm) repair 12/04/2008  . CHRONIC KIDNEY DISEASE STAGE II (MILD) 12/04/2008  . ESOPHAGEAL STRICTURE 05/29/2008  . OTHER DYSPHAGIA 05/29/2008   Kerin Perna, PTA 07/07/2015 4:01 PM  [place], [place], [address] Phone: [phone]   Fax:  [phone]

## 2015-07-13 ENCOUNTER — Encounter: Payer: Commercial Managed Care - HMO | Admitting: Physical Therapy

## 2015-07-14 ENCOUNTER — Ambulatory Visit (INDEPENDENT_AMBULATORY_CARE_PROVIDER_SITE_OTHER): Payer: Commercial Managed Care - HMO | Admitting: Physical Therapy

## 2015-07-14 DIAGNOSIS — M545 Low back pain, unspecified: Secondary | ICD-10-CM

## 2015-07-14 DIAGNOSIS — R293 Abnormal posture: Secondary | ICD-10-CM

## 2015-07-14 DIAGNOSIS — R29898 Other symptoms and signs involving the musculoskeletal system: Secondary | ICD-10-CM

## 2015-07-14 DIAGNOSIS — M6281 Muscle weakness (generalized): Secondary | ICD-10-CM

## 2015-07-14 NOTE — Patient Instructions (Signed)
WALKING  Walking is a great form of exercise to increase your strength, endurance and overall fitness.  A walking program can help you start slowly and gradually build endurance as you go.  Everyone's ability is different, so each person's starting point will be different.  You do not have to follow them exactly.  The are just samples. You should simply find out what's right for you and stick to that program.   In the beginning, you'll start off walking 2-3 times a day for short distances.  As you get stronger, you'll be walking further at just 1-2 times per day.  A. You Can Walk For A Certain Length Of Time Each Day    Walk 5 minutes 3 times per day.  Increase 2 minutes every 2 days (3 times per day).  Work up to 25-30 minutes (1-2 times per day).   Example:   Day 1-2 5 minutes 3 times per day   Day 7-8 12 minutes 2-3 times per day   Day 13-14 25 minutes 1-2 times per day  B. You Can Walk For a Certain Distance Each Day     Distance can be substituted for time.    Example:   3 trips to mailbox (at road)   3 trips to corner of block   3 trips around the block  C. Go to local [employer] and use the track.     D. Walk __x__ Jog ____ Run ___  Please only do the exercises that your therapist has initialed and dated  [place] [facility] [place], [place] [address]  [phone] (office) [phone] (fax)

## 2015-07-14 NOTE — Therapy (Signed)
[place], [place], [address] Phone: [phone]   Fax:  [phone]  Physical Therapy Treatment  Patient Details  Name: Troy Ray MRN: [id number] Date of Birth: [birth year] Referring Provider:  Suella Broad, MD  Encounter Date: 07/14/2015      PT End of Session - 07/14/15 1400    Visit Number 5   Number of Visits 8   Date for PT Re-Evaluation 07/28/15   PT Start Time 7939   PT Stop Time 1445   PT Time Calculation (min) 47 min   Activity Tolerance Patient tolerated treatment well      Past Medical History  Diagnosis Date  . CAD (coronary artery disease)   . Hypertension   . Chronic kidney disease     chronic, stage II  . Hyperlipidemia   . PVD (peripheral vascular disease)   . Postural lightheadedness   . Memory loss   . Dysphagia   . Esophageal stricture     Dr [name]  . CRI (chronic renal insufficiency)   . Sensorineural hearing loss, bilateral   . Carotid stenosis, bilateral     right 60-79% stenosed.  Left 40-59%  . Adenomatous colon polyp   . Diverticulosis   . Unspecified constipation 12/29/2012  . BPH (benign prostatic hyperplasia) 07/14/2013  . Headache(784.0) 12/01/2013  . Glaucoma   . Medicare annual wellness visit, subsequent 02/04/2015  . Insomnia 05/12/2015    Past Surgical History  Procedure Laterality Date  . Hernia repair  03/23/05    left inguinal Lichtenstein repair, with mesh.  [name]  . Abdominal aortic aneurysm repair  1996    Dr [name]  . Renal artery stent  1996    Dr [name].  Bilateral stenting  . Coronary artery bypass graft    . Eye surgery      cataracts b/l and laser    There were no vitals filed for this visit.  Visit Diagnosis:  Left-sided low back pain without sciatica  Weakness of back  Abnormal posture      Subjective Assessment - 07/14/15 1400    Subjective Pt reports the pain in his back comes and goes, at best 2/10  and today is 5/10.  Pt reports he has pain relief during therapy, then back feels worse later in day.    Pt reports 15-20% improvement since beginning therapy.   Pain Score 5    Pain Location Back   Pain Orientation Left   Pain Descriptors / Indicators Aching   Aggravating Factors  bending over    Pain Relieving Factors relaxing, heat.             [facility] PT Assessment - 07/14/15 0001    Assessment   Medical Diagnosis low back pain   Onset Date/Surgical Date 05/12/15   Next MD Visit not known   Prior Therapy no            [facility] Adult PT Treatment/Exercise - 07/14/15 0001    Lumbar Exercises: Stretches   Single Knee to Chest Stretch 3 reps;20 seconds   Lower Trunk Rotation 5 reps;10 seconds  2 sets   Lumbar Exercises: Aerobic   Stationary Bike Nustep L4x5'   Lumbar Exercises: Supine   Bent Knee Raise 10 reps;1 second  with ab set   Bridge 10 reps  2 sets   Bridge Limitations initial rep pt reports LBP, subsides with repetitions   Straight Leg Raise 10 reps;1 second  each  leg   Isometric Hip Flexion --   Other Supine Lumbar Exercises thoracic lifts trial - unable to initiate well despite multiple cues.     Other Supine Lumbar Exercises 10x5sec leg presses each side   Knee/Hip Exercises: Stretches   Passive Hamstring Stretch Right;Left;1 rep;60 seconds   Modalities   Modalities Electrical Stimulation   Moist Heat Therapy   Number Minutes Moist Heat 15 Minutes   Moist Heat Location Lumbar Spine   Electrical Stimulation   Electrical Stimulation Location Lt lumbar paraspinals/QL   Electrical Stimulation Action IFC   Electrical Stimulation Parameters to tolerance x 15 min    Electrical Stimulation Goals Pain                PT Education - 07/14/15 1440    Education provided Yes   Education Details Info on walking program    Person(s) Educated Patient   Methods Explanation;Handout   Comprehension Verbalized understanding             PT Long Term  Goals - 07/14/15 1417    PT LONG TERM GOAL #1   Title I with advanced HEP ( 07/28/15)   Time 4   Period Weeks   Status On-going   PT LONG TERM GOAL #2   Title report pain decreased =/> 75% first thing in the AM ( 07/28/15)    Time 4   Period Weeks   Status On-going   PT LONG TERM GOAL #3   Title perform a walking program ( 07/28/15)    Time 4   Period Weeks   Status On-going   PT LONG TERM GOAL #4   Title improve FOTO =/< CL level (42% limited)   Time 4   Period Weeks   Status On-going               Plan - 07/14/15 1419    Clinical Impression Statement Pt hasn't started walking program yet, although plans to in the next few days (LTG #3).  Pt tolerated exercises without increase in pain, except a little pain in Lt LB with Lt SLR. Slowly progressing towards established goals.    Pt will benefit from skilled therapeutic intervention in order to improve on the following deficits Postural dysfunction;Decreased strength;Pain;Increased muscle spasms;Decreased range of motion   Rehab Potential Good   PT Frequency 2x / week   PT Duration 4 weeks   PT Treatment/Interventions Ultrasound;Balance training;Neuromuscular re-education;Patient/family education;Stair training;Cryotherapy;Electrical Stimulation;Moist Heat;Therapeutic exercise;Manual techniques   PT Next Visit Plan progress QL stretching, core ex and STW PRN   Consulted and Agree with Plan of Care Patient        Problem List Patient Active Problem List   Diagnosis Date Noted  . Skin lesion of left arm 05/12/2015  . Gastroesophageal reflux disease without esophagitis 05/12/2015  . Insomnia 05/12/2015  . Medicare annual wellness visit, subsequent 02/04/2015  . Glaucoma 01/25/2015  . CKD (chronic kidney disease) stage 3, GFR 30-59 ml/min 08/21/2014  . CAP (community acquired pneumonia) 08/20/2014  . Pneumonia 08/20/2014  . Low back pain with right-sided sciatica 04/03/2014  . Apnea 01/25/2014  . Headache(784.0)  12/01/2013  . Sinusitis 10/13/2013  . BPH (benign prostatic hyperplasia) 07/14/2013  . Constipation 12/29/2012  . LACK OF COORDINATION 11/17/2010  . HYPERGLYCEMIA 11/17/2010  . PERSONAL HISTORY OF COLONIC POLYPS 09/21/2009  . CAROTID ARTERY STENOSIS, WITHOUT INFARCTION 05/11/2009  . POSTURAL LIGHTHEADEDNESS 04/06/2009  . MEMORY LOSS 04/06/2009  . Hyperlipidemia 12/07/2008  . Essential hypertension 12/07/2008  .  Coronary atherosclerosis 12/07/2008  . History of AAA (abdominal aortic aneurysm) repair 12/04/2008  . CHRONIC KIDNEY DISEASE STAGE II (MILD) 12/04/2008  . ESOPHAGEAL STRICTURE 05/29/2008  . OTHER DYSPHAGIA 05/29/2008   Kerin Perna, PTA 07/14/2015 2:40 PM  [place], [place], [address] Phone: [phone]   Fax:  [phone]

## 2015-07-16 ENCOUNTER — Encounter: Payer: Commercial Managed Care - HMO | Admitting: Physical Therapy

## 2015-07-16 ENCOUNTER — Encounter: Payer: Self-pay | Admitting: Rehabilitative and Restorative Service Providers"

## 2015-07-16 ENCOUNTER — Ambulatory Visit (INDEPENDENT_AMBULATORY_CARE_PROVIDER_SITE_OTHER): Payer: Commercial Managed Care - HMO | Admitting: Rehabilitative and Restorative Service Providers"

## 2015-07-16 DIAGNOSIS — R293 Abnormal posture: Secondary | ICD-10-CM | POA: Diagnosis not present

## 2015-07-16 DIAGNOSIS — R29898 Other symptoms and signs involving the musculoskeletal system: Secondary | ICD-10-CM

## 2015-07-16 DIAGNOSIS — M6281 Muscle weakness (generalized): Secondary | ICD-10-CM

## 2015-07-16 DIAGNOSIS — M545 Low back pain, unspecified: Secondary | ICD-10-CM

## 2015-07-16 NOTE — Therapy (Signed)
[place], [place], [address] Phone: [phone]   Fax:  [phone]  Physical Therapy Treatment  Patient Details  Name: Troy Ray MRN: [id number] Date of Birth: 1928-02-08 Referring Provider:  Suella Broad, MD  Encounter Date: 07/16/2015      PT End of Session - 07/16/15 1451    Visit Number 6   Number of Visits 8   Date for PT Re-Evaluation 07/28/15   PT Start Time 0388   PT Stop Time 1457   PT Time Calculation (min) 59 min   Activity Tolerance Patient tolerated treatment well      Past Medical History  Diagnosis Date  . CAD (coronary artery disease)   . Hypertension   . Chronic kidney disease     chronic, stage II  . Hyperlipidemia   . PVD (peripheral vascular disease)   . Postural lightheadedness   . Memory loss   . Dysphagia   . Esophageal stricture     Dr [name]  . CRI (chronic renal insufficiency)   . Sensorineural hearing loss, bilateral   . Carotid stenosis, bilateral     right 60-79% stenosed.  Left 40-59%  . Adenomatous colon polyp   . Diverticulosis   . Unspecified constipation 12/29/2012  . BPH (benign prostatic hyperplasia) 07/14/2013  . Headache(784.0) 12/01/2013  . Glaucoma   . Medicare annual wellness visit, subsequent 02/04/2015  . Insomnia 05/12/2015    Past Surgical History  Procedure Laterality Date  . Hernia repair  03/23/05    left inguinal Lichtenstein repair, with mesh.  [name]  . Abdominal aortic aneurysm repair  1996    Dr [name]  . Renal artery stent  1996    Dr [name].  Bilateral stenting  . Coronary artery bypass graft    . Eye surgery      cataracts b/l and laser    There were no vitals filed for this visit.  Visit Diagnosis:  Left-sided low back pain without sciatica  Weakness of back  Abnormal posture      Subjective Assessment - 07/16/15 1408    Subjective Just doesn't feel good today. Has nothing to do with his back.     Currently in Pain? Yes   Pain Score 2    Pain Location Back   Pain Orientation Left   Pain Descriptors / Indicators Aching   Pain Type Acute pain                         [facility] Adult PT Treatment/Exercise - 07/16/15 0001    Lumbar Exercises: Stretches   Single Knee to Chest Stretch 3 reps;20 seconds   Double Knee to Chest Stretch 5 reps;20 seconds   Lower Trunk Rotation 5 reps;10 seconds  2 sets   Lumbar Exercises: Aerobic   Stationary Bike Nustep L4x5'   Lumbar Exercises: Supine   Bent Knee Raise 10 reps;1 second   Bridge 10 reps  2 sets   Knee/Hip Exercises: Stretches   Passive Hamstring Stretch Right;Left;60 seconds;2 reps  PT assisted   Piriformis Stretch Right;Left;2 reps;30 seconds  PT assisted   Other Knee/Hip Stretches IR/ER stretch/movement -PT assisted   Knee/Hip Exercises: Standing   Stairs step ups Rt x10; Lt x10 6 in sep   Gait Training side steps to either side 20 feet; backward wlaking 20 feet with cues for foot placement; attempt at heel toe unable to achieve true heel to toe  Other Standing Knee Exercises sit to stand; stand to sit without arms; slowly sitting down x6   Modalities   Modalities Electrical Stimulation   Moist Heat Therapy   Number Minutes Moist Heat 15 Minutes   Moist Heat Location Lumbar Spine   Electrical Stimulation   Electrical Stimulation Location Lt lumbar paraspinals/QL   Electrical Stimulation Action IFC   Electrical Stimulation Parameters to tolerance   Electrical Stimulation Goals Pain   Manual Therapy   Manual therapy comments passive stretch in hip and LB with pt in supine                     PT Long Term Goals - 07/14/15 1417    PT LONG TERM GOAL #1   Title I with advanced HEP ( 07/28/15)   Time 4   Period Weeks   Status On-going   PT LONG TERM GOAL #2   Title report pain decreased =/> 75% first thing in the AM ( 07/28/15)    Time 4   Period Weeks   Status On-going   PT LONG TERM GOAL  #3   Title perform a walking program ( 07/28/15)    Time 4   Period Weeks   Status On-going   PT LONG TERM GOAL #4   Title improve FOTO =/< CL level (42% limited)   Time 4   Period Weeks   Status On-going               Plan - 07/16/15 1452    Clinical Impression Statement Patient not feeling well today, having trouble with his balance. Added passive stretch for LB and hips with patient supine. Added some activities for gait/balance to improve functional stabilization through LB. Tol new exercises well.    Pt will benefit from skilled therapeutic intervention in order to improve on the following deficits Postural dysfunction;Decreased strength;Pain;Increased muscle spasms;Decreased range of motion   Rehab Potential Good   PT Frequency 2x / week   PT Duration 4 weeks   PT Treatment/Interventions Ultrasound;Balance training;Neuromuscular re-education;Patient/family education;Stair training;Cryotherapy;Electrical Stimulation;Moist Heat;Therapeutic exercise;Manual techniques   PT Next Visit Plan progress QL stretching, core ex and STW PRN   PT Home Exercise Plan HEP    Consulted and Agree with Plan of Care Patient        Problem List Patient Active Problem List   Diagnosis Date Noted  . Skin lesion of left arm 05/12/2015  . Gastroesophageal reflux disease without esophagitis 05/12/2015  . Insomnia 05/12/2015  . Medicare annual wellness visit, subsequent 02/04/2015  . Glaucoma 01/25/2015  . CKD (chronic kidney disease) stage 3, GFR 30-59 ml/min 08/21/2014  . CAP (community acquired pneumonia) 08/20/2014  . Pneumonia 08/20/2014  . Low back pain with right-sided sciatica 04/03/2014  . Apnea 01/25/2014  . Headache(784.0) 12/01/2013  . Sinusitis 10/13/2013  . BPH (benign prostatic hyperplasia) 07/14/2013  . Constipation 12/29/2012  . LACK OF COORDINATION 11/17/2010  . HYPERGLYCEMIA 11/17/2010  . PERSONAL HISTORY OF COLONIC POLYPS 09/21/2009  . CAROTID ARTERY STENOSIS,  WITHOUT INFARCTION 05/11/2009  . POSTURAL LIGHTHEADEDNESS 04/06/2009  . MEMORY LOSS 04/06/2009  . Hyperlipidemia 12/07/2008  . Essential hypertension 12/07/2008  . Coronary atherosclerosis 12/07/2008  . History of AAA (abdominal aortic aneurysm) repair 12/04/2008  . CHRONIC KIDNEY DISEASE STAGE II (MILD) 12/04/2008  . ESOPHAGEAL STRICTURE 05/29/2008  . OTHER DYSPHAGIA 05/29/2008    Celyn Nilda Simmer PT, MPH 07/16/2015, 3:00 PM  [facility] [place]  [place], [place], [address] Phone: [phone]   Fax:  [phone]

## 2015-07-19 ENCOUNTER — Encounter: Payer: Commercial Managed Care - HMO | Admitting: Rehabilitation

## 2015-07-19 ENCOUNTER — Ambulatory Visit (INDEPENDENT_AMBULATORY_CARE_PROVIDER_SITE_OTHER): Payer: Commercial Managed Care - HMO | Admitting: Physical Therapy

## 2015-07-19 DIAGNOSIS — M6281 Muscle weakness (generalized): Secondary | ICD-10-CM

## 2015-07-19 DIAGNOSIS — M545 Low back pain, unspecified: Secondary | ICD-10-CM

## 2015-07-19 DIAGNOSIS — R293 Abnormal posture: Secondary | ICD-10-CM | POA: Diagnosis not present

## 2015-07-19 DIAGNOSIS — R29898 Other symptoms and signs involving the musculoskeletal system: Secondary | ICD-10-CM

## 2015-07-19 NOTE — Therapy (Signed)
[place], [place], [address] Phone: [phone]   Fax:  [phone]  Physical Therapy Treatment  Patient Details  Name: Troy Ray MRN: [id number] Date of Birth: 05/26/1928 Referring Provider:  Suella Broad, MD  Encounter Date: 07/19/2015      PT End of Session - 07/19/15 1525    Visit Number 7   Number of Visits 8   Date for PT Re-Evaluation 07/28/15   PT Start Time 5277   PT Stop Time 1535   PT Time Calculation (min) 58 min      Past Medical History  Diagnosis Date  . CAD (coronary artery disease)   . Hypertension   . Chronic kidney disease     chronic, stage II  . Hyperlipidemia   . PVD (peripheral vascular disease)   . Postural lightheadedness   . Memory loss   . Dysphagia   . Esophageal stricture     Dr [name]  . CRI (chronic renal insufficiency)   . Sensorineural hearing loss, bilateral   . Carotid stenosis, bilateral     right 60-79% stenosed.  Left 40-59%  . Adenomatous colon polyp   . Diverticulosis   . Unspecified constipation 12/29/2012  . BPH (benign prostatic hyperplasia) 07/14/2013  . Headache(784.0) 12/01/2013  . Glaucoma   . Medicare annual wellness visit, subsequent 02/04/2015  . Insomnia 05/12/2015    Past Surgical History  Procedure Laterality Date  . Hernia repair  03/23/05    left inguinal Lichtenstein repair, with mesh.  [name]  . Abdominal aortic aneurysm repair  1996    Dr [name]  . Renal artery stent  1996    Dr [name].  Bilateral stenting  . Coronary artery bypass graft    . Eye surgery      cataracts b/l and laser    There were no vitals filed for this visit.  Visit Diagnosis:  Left-sided low back pain without sciatica  Weakness of back  Abnormal posture      Subjective Assessment - 07/19/15 1453    Subjective "My back just hurts just a little"   Currently in Pain? Yes   Pain Score 1    Pain Location Back   Pain  Orientation Left   Pain Descriptors / Indicators Aching   Aggravating Factors  bending over    Pain Relieving Factors relaxing, heat            [facility] PT Assessment - 07/19/15 0001    Assessment   Medical Diagnosis low back pain   Onset Date/Surgical Date 05/12/15   Next MD Visit not known   Prior Therapy no           [facility] Adult PT Treatment/Exercise - 07/19/15 0001    Lumbar Exercises: Stretches   Single Knee to Chest Stretch 3 reps;20 seconds   Lower Trunk Rotation 5 reps;10 seconds  2 sets   Lumbar Exercises: Aerobic   Stationary Bike NuStep L4: 6   Lumbar Exercises: Supine   Bent Knee Raise 10 reps;1 second   Bridge 10 reps  2 sets   Knee/Hip Exercises: Stretches   Passive Hamstring Stretch Right;Left;60 seconds;2 reps  PT assisted   Piriformis Stretch Right;Left;2 reps;30 seconds  PT assisted   Knee/Hip Exercises: Standing   Other Standing Knee Exercises sit to stand; stand to sit without arms; slowly sitting down x 10    Knee/Hip Exercises: Seated   Clamshell with Marga Hoots  2 sets  of 10   Moist Heat Therapy   Number Minutes Moist Heat 15 Minutes   Moist Heat Location Lumbar Spine   Electrical Stimulation   Electrical Stimulation Location Lt lumbar paraspinals/QL   Electrical Stimulation Action IFC   Electrical Stimulation Parameters to tolerance    Electrical Stimulation Goals Pain           PT Education - 07/19/15 1531    Education provided Yes   Education Details Added seated resisted clams.    Person(s) Educated Patient   Methods Explanation;Handout   Comprehension Returned demonstration;Verbalized understanding             PT Long Term Goals - 07/19/15 1503    PT LONG TERM GOAL #1   Title I with advanced HEP ( 07/28/15)   Time 4   Period Weeks   Status On-going   PT LONG TERM GOAL #2   Title report pain decreased =/> 75% first thing in the AM ( 07/28/15)    Time 4   Period Weeks   Status On-going  reports 60% improvement    PT LONG TERM GOAL #3   Title perform a walking program ( 07/28/15)    Time 4   Period Weeks   Status On-going   PT LONG TERM GOAL #4   Title improve FOTO =/< CL level (42% limited)   Time 4   Period Weeks   Status On-going               Plan - 07/19/15 1532    Clinical Impression Statement Pt is reporting overall decrease in pain (~60% reduction) progressing towards LTG # 2 and pt has started small steps towards a walking program.  Pt tolerated most exercises well, except bridging; pt c/o increased back pain with descent of back to table. Pt reports decrease in pain at end of session, after use of MHP and estim. Progressing well towards goals.    Pt will benefit from skilled therapeutic intervention in order to improve on the following deficits Postural dysfunction;Decreased strength;Pain;Increased muscle spasms;Decreased range of motion   Rehab Potential Good   PT Frequency 2x / week   PT Duration 4 weeks   PT Treatment/Interventions Ultrasound;Balance training;Neuromuscular re-education;Patient/family education;Stair training;Cryotherapy;Electrical Stimulation;Moist Heat;Therapeutic exercise;Manual techniques   PT Next Visit Plan Continue progressive LB/core strengthening, modalities PRN.  Write MD note for upcoming appt and complete FOTO (end of POC).   Consulted and Agree with Plan of Care Patient        Problem List Patient Active Problem List   Diagnosis Date Noted  . Skin lesion of left arm 05/12/2015  . Gastroesophageal reflux disease without esophagitis 05/12/2015  . Insomnia 05/12/2015  . Medicare annual wellness visit, subsequent 02/04/2015  . Glaucoma 01/25/2015  . CKD (chronic kidney disease) stage 3, GFR 30-59 ml/min 08/21/2014  . CAP (community acquired pneumonia) 08/20/2014  . Pneumonia 08/20/2014  . Low back pain with right-sided sciatica 04/03/2014  . Apnea 01/25/2014  . Headache(784.0) 12/01/2013  . Sinusitis 10/13/2013  . BPH (benign prostatic  hyperplasia) 07/14/2013  . Constipation 12/29/2012  . LACK OF COORDINATION 11/17/2010  . HYPERGLYCEMIA 11/17/2010  . PERSONAL HISTORY OF COLONIC POLYPS 09/21/2009  . CAROTID ARTERY STENOSIS, WITHOUT INFARCTION 05/11/2009  . POSTURAL LIGHTHEADEDNESS 04/06/2009  . MEMORY LOSS 04/06/2009  . Hyperlipidemia 12/07/2008  . Essential hypertension 12/07/2008  . Coronary atherosclerosis 12/07/2008  . History of AAA (abdominal aortic aneurysm) repair 12/04/2008  . CHRONIC KIDNEY DISEASE STAGE II (MILD) 12/04/2008  .  ESOPHAGEAL STRICTURE 05/29/2008  . OTHER DYSPHAGIA 05/29/2008   Kerin Perna, PTA 07/19/2015 3:45 PM  [facility] [place], [place], [address] Phone: [phone]   Fax:  [phone]

## 2015-07-19 NOTE — Patient Instructions (Addendum)
ABDUCTION: Sitting - Exercise Ball: Resistance Band (Active)   Sit with feet flat. Open knees against green resistance band, keeping feet flat on ground.  Complete _2__ sets of _10__ repetitions. Perform _1__ sessions per day.   [facility] [place], [place] [address]  [phone] (office) [phone] (fax)

## 2015-07-21 ENCOUNTER — Ambulatory Visit (INDEPENDENT_AMBULATORY_CARE_PROVIDER_SITE_OTHER): Payer: Commercial Managed Care - HMO | Admitting: Physical Therapy

## 2015-07-21 DIAGNOSIS — M545 Low back pain, unspecified: Secondary | ICD-10-CM

## 2015-07-21 DIAGNOSIS — M6281 Muscle weakness (generalized): Secondary | ICD-10-CM

## 2015-07-21 DIAGNOSIS — R29898 Other symptoms and signs involving the musculoskeletal system: Secondary | ICD-10-CM

## 2015-07-21 DIAGNOSIS — R293 Abnormal posture: Secondary | ICD-10-CM

## 2015-07-21 NOTE — Therapy (Addendum)
[facility] [place], [place], [address] Phone: [phone]   Fax:  [phone]  Physical Therapy Treatment  Patient Details  Name: Troy Ray MRN: [id number] Date of Birth: [birth year] Referring Provider:  Suella Broad, MD  Encounter Date: 07/21/2015      PT End of Session - 07/21/15 1522    Visit Number 8   Number of Visits 12   Date for PT Re-Evaluation 08/18/15   PT Start Time 3500   PT Stop Time 1532   PT Time Calculation (min) 59 min   Activity Tolerance Patient tolerated treatment well      Past Medical History  Diagnosis Date  . CAD (coronary artery disease)   . Hypertension   . Chronic kidney disease     chronic, stage II  . Hyperlipidemia   . PVD (peripheral vascular disease)   . Postural lightheadedness   . Memory loss   . Dysphagia   . Esophageal stricture     Dr [name]  . CRI (chronic renal insufficiency)   . Sensorineural hearing loss, bilateral   . Carotid stenosis, bilateral     right 60-79% stenosed.  Left 40-59%  . Adenomatous colon polyp   . Diverticulosis   . Unspecified constipation 12/29/2012  . BPH (benign prostatic hyperplasia) 07/14/2013  . Headache(784.0) 12/01/2013  . Glaucoma   . Medicare annual wellness visit, subsequent 02/04/2015  . Insomnia 05/12/2015    Past Surgical History  Procedure Laterality Date  . Hernia repair  03/23/05    left inguinal Lichtenstein repair, with mesh.  [name]  . Abdominal aortic aneurysm repair  1996    Dr [name]  . Renal artery stent  1996    Dr [name].  Bilateral stenting  . Coronary artery bypass graft    . Eye surgery      cataracts b/l and laser    There were no vitals filed for this visit.  Visit Diagnosis:  Left-sided low back pain without sciatica  Weakness of back  Abnormal posture      Subjective Assessment - 07/21/15 1449    Subjective "My back hurts a lot less than it use to".  Pt reports he  feels the exercises and walking has helped.  Driving to therapy session has become difficult, not interested in continuation of therapy- will continue HEP.    Currently in Pain? Yes   Pain Score 1    Pain Location Back   Pain Orientation Left   Pain Descriptors / Indicators Aching   Aggravating Factors  bending over    Pain Relieving Factors relaxing, heat.                          [facility] PT Treatment/Exercise - 07/21/15 0001    Lumbar Exercises: Stretches   Passive Hamstring Stretch 2 reps;20 seconds  assisted by PTA   Single Knee to Chest Stretch 1 rep;20 seconds  each leg   Lower Trunk Rotation 5 reps;10 seconds  2 sets   Lumbar Exercises: Aerobic   Stationary Bike NuStep L4: 11 min    Lumbar Exercises: Supine   Clam 10 reps  each side, with ab set   Bridge 10 reps   Straight Leg Raise 10 reps;1 second  each leg   Knee/Hip Exercises: Stretches   Piriformis Stretch Right;Left;2 reps;30 seconds  PTA assisted   Knee/Hip Exercises: Standing   Lateral Step Up 1 set;10 reps;Hand  Hold: 2;Step Height: 6";Left   Forward Step Up 1 set;10 reps;Hand Hold: 2;Step Height: 6"   Other Standing Knee Exercises sit to stand; stand to sit without arms; slowly sitting down x 10    Modalities   Modalities Electrical Stimulation;Moist Heat   Moist Heat Therapy   Number Minutes Moist Heat 15 Minutes   Moist Heat Location Lumbar Spine  Lt   Electrical Stimulation   Electrical Stimulation Location Lt lumbar paraspinals/QL   Electrical Stimulation Action IFC   Electrical Stimulation Parameters to tolerance x 15 min    Electrical Stimulation Goals Pain                     PT Long Term Goals - 07/21/15 1502    PT LONG TERM GOAL #1   Title I with advanced HEP ( 07/28/15)   Time 4   Period Weeks   Status On-going   PT LONG TERM GOAL #2   Title report pain decreased =/> 75% first thing in the AM ( 07/28/15)    Time 4   Period Weeks   Status On-going   reports 60% improvement   PT LONG TERM GOAL #3   Title perform a walking program ( 07/28/15)    Time 4   Period Weeks   Status Partially Met   PT LONG TERM GOAL #4   Title improve FOTO =/< CL level (42% limited)   Time 4   Period Weeks   Status On-going  51% limited - on 07/21/15               Plan - 07/21/15 1505    Clinical Impression Statement Pt has had 60% reduction in Lt LBP since beginning therapy.  Pt has partially met his goals.  Pt interested in continuation of therapy at a lower frequency, to meet the established goals and maximize function.    Pt will benefit from skilled therapeutic intervention in order to improve on the following deficits Postural dysfunction;Decreased strength;Pain;Increased muscle spasms;Decreased range of motion   Rehab Potential Good   PT Frequency 1x / week   PT Duration 4 weeks   PT Treatment/Interventions Ultrasound;Balance training;Neuromuscular re-education;Patient/family education;Stair training;Cryotherapy;Electrical Stimulation;Moist Heat;Therapeutic exercise;Manual techniques   PT Next Visit Plan Continue progressive core/lumbar strengthening, LE stretching.  Spoke to supervising PT regarding pt's progress and his desire to continue therapy.    Consulted and Agree with Plan of Care Patient        Problem List Patient Active Problem List   Diagnosis Date Noted  . Skin lesion of left arm 05/12/2015  . Gastroesophageal reflux disease without esophagitis 05/12/2015  . Insomnia 05/12/2015  . Medicare annual wellness visit, subsequent 02/04/2015  . Glaucoma 01/25/2015  . CKD (chronic kidney disease) stage 3, GFR 30-59 ml/min 08/21/2014  . CAP (community acquired pneumonia) 08/20/2014  . Pneumonia 08/20/2014  . Low back pain with right-sided sciatica 04/03/2014  . Apnea 01/25/2014  . Headache(784.0) 12/01/2013  . Sinusitis 10/13/2013  . BPH (benign prostatic hyperplasia) 07/14/2013  . Constipation 12/29/2012  . LACK OF  COORDINATION 11/17/2010  . HYPERGLYCEMIA 11/17/2010  . PERSONAL HISTORY OF COLONIC POLYPS 09/21/2009  . CAROTID ARTERY STENOSIS, WITHOUT INFARCTION 05/11/2009  . POSTURAL LIGHTHEADEDNESS 04/06/2009  . MEMORY LOSS 04/06/2009  . Hyperlipidemia 12/07/2008  . Essential hypertension 12/07/2008  . Coronary atherosclerosis 12/07/2008  . History of AAA (abdominal aortic aneurysm) repair 12/04/2008  . CHRONIC KIDNEY DISEASE STAGE II (MILD) 12/04/2008  . ESOPHAGEAL STRICTURE 05/29/2008  .  OTHER DYSPHAGIA 05/29/2008    Kerin Perna, PTA 07/21/2015 5:13 PM   Jeral Pinch, PT 07/21/2015 5:13 PM   [facility] [place], [place], [address] Phone: [phone]   Fax:  [phone]     PHYSICAL THERAPY DISCHARGE SUMMARY  Visits from Start of Care: 8  Current functional level related to goals / functional outcomes: See above   Remaining deficits: See above   Education / Equipment: HEP Plan: Patient agrees to discharge.  Patient goals were partially met. Patient is being discharged due to the physician's request.  Pt to have injections. ?????      Jeral Pinch, PT 08/03/2015 9:14 AM

## 2015-07-22 ENCOUNTER — Encounter: Payer: Commercial Managed Care - HMO | Admitting: Rehabilitation

## 2015-07-26 ENCOUNTER — Encounter: Payer: Commercial Managed Care - HMO | Admitting: Physical Therapy

## 2015-07-26 DIAGNOSIS — M4690 Unspecified inflammatory spondylopathy, site unspecified: Secondary | ICD-10-CM | POA: Diagnosis not present

## 2015-07-26 DIAGNOSIS — M545 Low back pain: Secondary | ICD-10-CM | POA: Diagnosis not present

## 2015-07-26 DIAGNOSIS — M5136 Other intervertebral disc degeneration, lumbar region: Secondary | ICD-10-CM | POA: Diagnosis not present

## 2015-07-29 ENCOUNTER — Encounter: Payer: Self-pay | Admitting: Family Medicine

## 2015-07-29 ENCOUNTER — Ambulatory Visit (INDEPENDENT_AMBULATORY_CARE_PROVIDER_SITE_OTHER): Payer: Commercial Managed Care - HMO | Admitting: Family Medicine

## 2015-07-29 ENCOUNTER — Encounter: Payer: Commercial Managed Care - HMO | Admitting: Physical Therapy

## 2015-07-29 VITALS — BP 128/82 | HR 56 | Temp 97.6°F | Ht 69.0 in | Wt 177.2 lb

## 2015-07-29 DIAGNOSIS — R739 Hyperglycemia, unspecified: Secondary | ICD-10-CM | POA: Diagnosis not present

## 2015-07-29 DIAGNOSIS — Z23 Encounter for immunization: Secondary | ICD-10-CM

## 2015-07-29 DIAGNOSIS — E785 Hyperlipidemia, unspecified: Secondary | ICD-10-CM

## 2015-07-29 DIAGNOSIS — I10 Essential (primary) hypertension: Secondary | ICD-10-CM

## 2015-07-29 DIAGNOSIS — K5900 Constipation, unspecified: Secondary | ICD-10-CM

## 2015-07-29 DIAGNOSIS — M5441 Lumbago with sciatica, right side: Secondary | ICD-10-CM | POA: Diagnosis not present

## 2015-07-29 DIAGNOSIS — K219 Gastro-esophageal reflux disease without esophagitis: Secondary | ICD-10-CM

## 2015-07-29 LAB — CBC
HEMATOCRIT: 45.6 % (ref 39.0–52.0)
HEMOGLOBIN: 15.2 g/dL (ref 13.0–17.0)
MCHC: 33.2 g/dL (ref 30.0–36.0)
MCV: 90.3 fl (ref 78.0–100.0)
Platelets: 267 10*3/uL (ref 150.0–400.0)
RBC: 5.05 Mil/uL (ref 4.22–5.81)
RDW: 15.2 % (ref 11.5–15.5)
WBC: 8.1 10*3/uL (ref 4.0–10.5)

## 2015-07-29 LAB — COMPREHENSIVE METABOLIC PANEL
ALK PHOS: 46 U/L (ref 39–117)
ALT: 18 U/L (ref 0–53)
AST: 21 U/L (ref 0–37)
Albumin: 4 g/dL (ref 3.5–5.2)
BUN: 21 mg/dL (ref 6–23)
CHLORIDE: 104 meq/L (ref 96–112)
CO2: 27 mEq/L (ref 19–32)
Calcium: 9.4 mg/dL (ref 8.4–10.5)
Creatinine, Ser: 1.28 mg/dL (ref 0.40–1.50)
GFR: 56.72 mL/min — AB (ref >60.00)
Glucose, Bld: 109 mg/dL — ABNORMAL HIGH (ref 70–99)
Potassium: 4.7 mEq/L (ref 3.5–5.1)
Sodium: 138 mEq/L (ref 135–145)
TOTAL PROTEIN: 7.3 g/dL (ref 6.0–8.3)
Total Bilirubin: 0.7 mg/dL (ref 0.2–1.2)

## 2015-07-29 LAB — LIPID PANEL
CHOLESTEROL: 143 mg/dL (ref 0–200)
HDL: 37.7 mg/dL — ABNORMAL LOW (ref >39.00)
LDL CALC: 78 mg/dL (ref 0–99)
NonHDL: 105.6
Total CHOL/HDL Ratio: 4
Triglycerides: 136 mg/dL (ref 0.0–149.0)
VLDL: 27.2 mg/dL (ref 0.0–40.0)

## 2015-07-29 LAB — HEMOGLOBIN A1C: Hgb A1c MFr Bld: 6.2 % (ref 4.6–6.5)

## 2015-07-29 LAB — TSH: TSH: 4 u[IU]/mL (ref 0.35–4.50)

## 2015-07-29 MED ORDER — ATORVASTATIN CALCIUM 40 MG PO TABS: 40.0000 mg | ORAL_TABLET | Freq: Every day | ORAL | Status: DC

## 2015-07-29 NOTE — Progress Notes (Signed)
Patient ID: Troy Ray, male   DOB: 17-Dec-1927, 81 y.o.   MRN: [id number]   Subjective:    Patient ID: Troy Ray, male    DOB: Feb 28, 1928, 81 y.o.   MRN: [id number]  Chief Complaint  Patient presents with  . Follow-up    6 month    HPI Patient is in today for for follow-up he is doing better. His back pain is greatly improved as he is his radiculopathy status post surgery and physical therapy. Otherwise he will report he feels well. No acute illness or acute concerns. Denies polyuria or polydipsia. Denies CP/palp/SOB/HA/congestion/fevers/GI or GU c/o. Taking meds as prescribed  Past Medical History  Diagnosis Date  . CAD (coronary artery disease)   . Hypertension   . Chronic kidney disease     chronic, stage II  . Hyperlipidemia   . PVD (peripheral vascular disease)   . Postural lightheadedness   . Memory loss   . Dysphagia   . Esophageal stricture     Dr [name]  . CRI (chronic renal insufficiency)   . Sensorineural hearing loss, bilateral   . Carotid stenosis, bilateral     right 60-79% stenosed.  Left 40-59%  . Adenomatous colon polyp   . Diverticulosis   . Unspecified constipation 12/29/2012  . BPH (benign prostatic hyperplasia) 07/14/2013  . Headache(784.0) 12/01/2013  . Glaucoma   . Medicare annual wellness visit, subsequent 02/04/2015  . Insomnia 05/12/2015    Past Surgical History  Procedure Laterality Date  . Hernia repair  03/23/05    left inguinal Lichtenstein repair, with mesh.  [name]  . Abdominal aortic aneurysm repair  1996    Dr [name]  . Renal artery stent  1996    Dr [name].  Bilateral stenting  . Coronary artery bypass graft    . Eye surgery      cataracts b/l and laser    Family History  Problem Relation Age of Onset  . Heart disease Father     CAD  . Cancer Neg Hx   . Diabetes Neg Hx   . Coronary artery disease Father     Social History   Social History  . Marital Status: Married    Spouse Name: N/A  . Number of  Children: 3  . Years of Education: N/A   Occupational History  . MEDIA ASSISTANT    Social History Main Topics  . Smoking status: Former Research scientist (life sciences)  . Smokeless tobacco: Never Used     Comment: quit 1980  . Alcohol Use: 0.6 oz/week    1 Glasses of wine per week  . Drug Use: No  . Sexual Activity: Yes   Other Topics Concern  . Not on file   Social History Narrative    Outpatient Prescriptions Prior to Visit  Medication Sig Dispense Refill  . aspirin EC 81 MG EC tablet Take 81 mg by mouth at bedtime.     . finasteride (PROSCAR) 5 MG tablet Take 1 tablet (5 mg total) by mouth daily. 90 tablet 2  . losartan (COZAAR) 50 MG tablet Take 1 tablet (50 mg total) by mouth daily. 90 tablet 2  . LUMIGAN 0.01 % SOLN     . metoprolol succinate (TOPROL-XL) 25 MG 24 hr tablet Take 1 tablet (25 mg total) by mouth daily with supper. 90 tablet 2  . pantoprazole (PROTONIX) 40 MG tablet Take 1 tablet (40 mg total) by mouth daily. 90 tablet 2  . rosuvastatin (CRESTOR) 20 MG  tablet Take 1 tablet (20 mg total) by mouth daily. 90 tablet 2  . timolol (TIMOPTIC) 0.5 % ophthalmic solution Place 1 drop into both eyes 1 day or 1 dose.     No facility-administered medications prior to visit.    No Known Allergies  Review of Systems  Constitutional: Negative for fever and malaise/fatigue.  HENT: Negative for congestion.   Eyes: Negative for discharge.  Respiratory: Negative for shortness of breath.   Cardiovascular: Negative for chest pain, palpitations and leg swelling.  Gastrointestinal: Negative for nausea and abdominal pain.  Genitourinary: Negative for dysuria.  Musculoskeletal: Negative for falls.  Skin: Negative for rash.  Neurological: Negative for loss of consciousness and headaches.  Endo/Heme/Allergies: Negative for environmental allergies.  Psychiatric/Behavioral: Negative for depression. The patient is not nervous/anxious.        Objective:    Physical Exam  Constitutional: He is  oriented to person, place, and time. He appears well-developed and well-nourished. No distress.  HENT:  Head: Normocephalic and atraumatic.  Nose: Nose normal.  Eyes: Right eye exhibits no discharge. Left eye exhibits no discharge.  Neck: Normal range of motion. Neck supple.  Cardiovascular: Normal rate and regular rhythm.   Murmur heard. Pulmonary/Chest: Effort normal and breath sounds normal.  Abdominal: Soft. Bowel sounds are normal. There is no tenderness.  Musculoskeletal: He exhibits no edema.  Neurological: He is alert and oriented to person, place, and time.  Skin: Skin is warm and dry.  Psychiatric: He has a normal mood and affect.  Nursing note and vitals reviewed.   BP 128/82 mmHg  Pulse 56  Temp(Src) 97.6 F (36.4 C) (Oral)  Ht 5\' 9"  (1.753 m)  Wt 177 lb 4 oz (80.4 kg)  BMI 26.16 kg/m2  SpO2 97% Wt Readings from Last 3 Encounters:  07/29/15 177 lb 4 oz (80.4 kg)  04/29/15 181 lb 8 oz (82.328 kg)  01/25/15 182 lb 8 oz (82.781 kg)     Lab Results  Component Value Date   WBC 8.8 01/27/2015   HGB 14.8 01/27/2015   HCT 43.9 01/27/2015   PLT 218.0 01/27/2015   GLUCOSE 116* 01/27/2015   CHOL 132 01/27/2015   TRIG 121.0 01/27/2015   HDL 39.00* 01/27/2015   LDLCALC 69 01/27/2015   ALT 15 01/27/2015   AST 20 01/27/2015   NA 138 01/27/2015   K 4.3 01/27/2015   CL 105 01/27/2015   CREATININE 1.37 01/27/2015   BUN 21 01/27/2015   CO2 25 01/27/2015   TSH 3.98 01/27/2015   PSA 0.75 10/06/2013   INR 1.06 10/27/2010   HGBA1C 6.1 04/29/2015   MICROALBUR 0.87 10/06/2013    Lab Results  Component Value Date   TSH 3.98 01/27/2015   Lab Results  Component Value Date   WBC 8.8 01/27/2015   HGB 14.8 01/27/2015   HCT 43.9 01/27/2015   MCV 87.2 01/27/2015   PLT 218.0 01/27/2015   Lab Results  Component Value Date   NA 138 01/27/2015   K 4.3 01/27/2015   CO2 25 01/27/2015   GLUCOSE 116* 01/27/2015   BUN 21 01/27/2015   CREATININE 1.37 01/27/2015    BILITOT 0.5 01/27/2015   ALKPHOS 42 01/27/2015   AST 20 01/27/2015   ALT 15 01/27/2015   PROT 7.1 01/27/2015   ALBUMIN 4.0 01/27/2015   CALCIUM 9.4 01/27/2015   ANIONGAP 15 08/22/2014   GFR 52.51* 01/27/2015   Lab Results  Component Value Date   CHOL 132 01/27/2015   Lab  Results  Component Value Date   HDL 39.00* 01/27/2015   Lab Results  Component Value Date   LDLCALC 69 01/27/2015   Lab Results  Component Value Date   TRIG 121.0 01/27/2015   Lab Results  Component Value Date   CHOLHDL 3 01/27/2015   Lab Results  Component Value Date   HGBA1C 6.1 04/29/2015       Assessment & Plan:   Low back pain with right-sided sciatica Surgery has been very helpful and is pain is improved. Physical therapy was helpful. May continue pain meds sparingly but encouraged to try and minimize while still saying active  Gastroesophageal reflux disease without esophagitis Avoid offending foods, start probiotics. Do not eat large meals in late evening and consider raising head of bed.   Hyperglycemia hgba1c acceptable, minimize simple carbs. Increase exercise as tolerated.   Constipation Was struggling but is doing better now. Encouraged increased hydration and fiber in diet. Daily probiotics. If bowels not moving can use MOM 2 tbls po in 4 oz of warm prune juice by mouth every 2-3 days. If no results then repeat in 4 hours with  Dulcolax suppository pr, may repeat again in 4 more hours as needed. Seek care if symptoms worsen. Consider daily Miralax and/or Dulcolax if symptoms persist.   Essential hypertension Well controlled, no changes to meds. Encouraged heart healthy diet such as the DASH diet and exercise as tolerated.    I am having Mr. Luecke maintain his aspirin EC, LUMIGAN, timolol, finasteride, losartan, metoprolol succinate, pantoprazole, and rosuvastatin.  No orders of the defined types were placed in this encounter.     Elizabeth Sauer, LPN

## 2015-07-29 NOTE — Progress Notes (Signed)
Pre visit review using our clinic review tool, if applicable. No additional management support is needed unless otherwise documented below in the visit note. 

## 2015-07-29 NOTE — Patient Instructions (Signed)
Cholesterol  Cholesterol is a white, waxy, fat-like substance needed by your body in small amounts. The liver makes all the cholesterol you need. Cholesterol is carried from the liver by the blood through the blood vessels. Deposits of cholesterol (plaque) may build up on blood vessel walls. These make the arteries narrower and stiffer. Cholesterol plaques increase the risk for heart attack and stroke.   You cannot feel your cholesterol level even if it is very high. The only way to know it is high is with a blood test. Once you know your cholesterol levels, you should keep a record of the test results. Work with your health care Darcel Zick to keep your levels in the desired range.   WHAT DO THE RESULTS MEAN?  · Total cholesterol is a rough measure of all the cholesterol in your blood.    · LDL is the so-called bad cholesterol. This is the type that deposits cholesterol in the walls of the arteries. You want this level to be low.    · HDL is the good cholesterol because it cleans the arteries and carries the LDL away. You want this level to be high.  · Triglycerides are fat that the body can either burn for energy or store. High levels are closely linked to heart disease.    WHAT ARE THE DESIRED LEVELS OF CHOLESTEROL?  · Total cholesterol below 200.    · LDL below 100 for people at risk, below 70 for those at very high risk.    · HDL above 50 is good, above 60 is best.    · Triglycerides below 150.    HOW CAN I LOWER MY CHOLESTEROL?  · Diet. Follow your diet programs as directed by your health care Lashana Spang.    ¨ Choose fish or white meat chicken and turkey, roasted or baked. Limit fatty cuts of red meat, fried foods, and processed meats, such as sausage and lunch meats.    ¨ Eat lots of fresh fruits and vegetables.  ¨ Choose whole grains, beans, pasta, potatoes, and cereals.    ¨ Use only small amounts of olive, corn, or canola oils.    ¨ Avoid butter, mayonnaise, shortening, or palm kernel oils.  ¨ Avoid foods with  trans fats.    ¨ Drink skim or nonfat milk and eat low-fat or nonfat yogurt and cheeses. Avoid whole milk, cream, ice cream, egg yolks, and full-fat cheeses.    ¨ Healthy desserts include angel food cake, ginger snaps, animal crackers, hard candy, popsicles, and low-fat or nonfat frozen yogurt. Avoid pastries, cakes, pies, and cookies.    · Exercise. Follow your exercise programs as directed by your health care Chasiti Waddington.    ¨ A regular program helps decrease LDL and raise HDL.    ¨ A regular program helps with weight control.    ¨ Do things that increase your activity level like gardening, walking, or taking the stairs. Ask your health care Rayansh Herbst about how you can be more active in your daily life.    · Medicine. Take medicine only as directed by your health care Mar Walmer.    ¨ Medicine may be prescribed by your health care Herma Uballe to help lower cholesterol and decrease the risk for heart disease.    ¨ If you have several risk factors, you may need medicine even if your levels are normal.  Document Released: 07/18/2001 Document Revised: 03/09/2014 Document Reviewed: 08/06/2013  Exit[employer]® Patient Information ©2015 [employer]. This information is not intended to replace advice given to you by your health care Breannah Kratt. Make sure you discuss any questions you have with your   health care Quinnlan Abruzzo.

## 2015-08-04 NOTE — Assessment & Plan Note (Signed)
hgba1c acceptable, minimize simple carbs. Increase exercise as tolerated.  

## 2015-08-04 NOTE — Assessment & Plan Note (Signed)
Avoid offending foods, start probiotics. Do not eat large meals in late evening and consider raising head of bed.  

## 2015-08-04 NOTE — Assessment & Plan Note (Signed)
Surgery has been very helpful and is pain is improved. Physical therapy was helpful. May continue pain meds sparingly but encouraged to try and minimize while still saying active

## 2015-08-04 NOTE — Assessment & Plan Note (Signed)
Well controlled, no changes to meds. Encouraged heart healthy diet such as the DASH diet and exercise as tolerated.  °

## 2015-08-04 NOTE — Assessment & Plan Note (Signed)
Was struggling but is doing better now. Encouraged increased hydration and fiber in diet. Daily probiotics. If bowels not moving can use MOM 2 tbls po in 4 oz of warm prune juice by mouth every 2-3 days. If no results then repeat in 4 hours with  Dulcolax suppository pr, may repeat again in 4 more hours as needed. Seek care if symptoms worsen. Consider daily Miralax and/or Dulcolax if symptoms persist.

## 2015-08-17 ENCOUNTER — Telehealth: Payer: Self-pay

## 2015-08-17 ENCOUNTER — Other Ambulatory Visit: Payer: Self-pay | Admitting: Family Medicine

## 2015-08-17 DIAGNOSIS — K649 Unspecified hemorrhoids: Secondary | ICD-10-CM

## 2015-08-17 DIAGNOSIS — K645 Perianal venous thrombosis: Secondary | ICD-10-CM | POA: Diagnosis not present

## 2015-08-17 NOTE — Telephone Encounter (Signed)
Per wife patient went to [facility] for Thrombus  Hemorid and they told him he needed to see PCP asap to get procedure done. Please call patient to advise .[phone]

## 2015-08-17 NOTE — Telephone Encounter (Signed)
Called the wife informed referral to [facility] has been done, notes faxed already and should hear soon on appt.

## 2015-08-17 NOTE — Telephone Encounter (Signed)
I do not lance hemorrhoids, have asked for an urgent visit with general surgery for him

## 2015-08-18 ENCOUNTER — Encounter: Payer: Self-pay | Admitting: Family Medicine

## 2015-08-18 ENCOUNTER — Telehealth: Payer: Self-pay | Admitting: Family Medicine

## 2015-08-18 DIAGNOSIS — K645 Perianal venous thrombosis: Secondary | ICD-10-CM | POA: Diagnosis not present

## 2015-08-18 NOTE — Telephone Encounter (Signed)
Pt wife called asking that we send a list of pts meds and a note approving they be filled at the [place] to Dr. Zackery Barefoot at [facility], Fax# [phone]. Pt wants to get meds from the [place] as they would be less expensive for him.

## 2015-08-18 NOTE — Telephone Encounter (Signed)
Printed letter/med. List and on counter for signature. Called the patients wife left msg. To call back

## 2015-08-18 NOTE — Telephone Encounter (Signed)
Advise on request.  I can print his medication list and letter.  Let me know if ok to do.

## 2015-08-18 NOTE — Telephone Encounter (Signed)
Thanks please do write/print letter and med list and I will sign.

## 2015-08-19 NOTE — Telephone Encounter (Signed)
Faxed info. As requested,  Patient informed and mailed a copy of letter to his home

## 2015-08-24 DIAGNOSIS — M47817 Spondylosis without myelopathy or radiculopathy, lumbosacral region: Secondary | ICD-10-CM | POA: Diagnosis not present

## 2015-08-26 DIAGNOSIS — L814 Other melanin hyperpigmentation: Secondary | ICD-10-CM | POA: Diagnosis not present

## 2015-08-26 DIAGNOSIS — L821 Other seborrheic keratosis: Secondary | ICD-10-CM | POA: Diagnosis not present

## 2015-09-07 DIAGNOSIS — M1288 Other specific arthropathies, not elsewhere classified, other specified site: Secondary | ICD-10-CM | POA: Diagnosis not present

## 2015-09-07 DIAGNOSIS — M545 Low back pain: Secondary | ICD-10-CM | POA: Diagnosis not present

## 2015-09-07 DIAGNOSIS — M5136 Other intervertebral disc degeneration, lumbar region: Secondary | ICD-10-CM | POA: Diagnosis not present

## 2015-09-15 DIAGNOSIS — H26492 Other secondary cataract, left eye: Secondary | ICD-10-CM | POA: Diagnosis not present

## 2015-09-15 DIAGNOSIS — H401132 Primary open-angle glaucoma, bilateral, moderate stage: Secondary | ICD-10-CM | POA: Diagnosis not present

## 2015-10-08 ENCOUNTER — Telehealth: Payer: Self-pay | Admitting: Family Medicine

## 2015-10-08 MED ORDER — ATORVASTATIN CALCIUM 40 MG PO TABS: 40.0000 mg | ORAL_TABLET | Freq: Every day | ORAL | Status: DC

## 2015-10-08 NOTE — Telephone Encounter (Signed)
Caller name:Lou Ella Relation to [phone] Call back number:[phone] Pharmacy:[facility] [address]  Reason for call: pt has only 2 tablets left for rx atorvastatin (LIPITOR) 40 MG tablet, pt is needing rx called in as soon as possible.

## 2015-10-08 NOTE — Telephone Encounter (Signed)
Refill done.  

## 2015-11-10 ENCOUNTER — Encounter: Payer: Self-pay | Admitting: Internal Medicine

## 2015-11-10 ENCOUNTER — Ambulatory Visit (HOSPITAL_BASED_OUTPATIENT_CLINIC_OR_DEPARTMENT_OTHER)
Admission: RE | Admit: 2015-11-10 | Discharge: 2015-11-10 | Disposition: A | Discharge status: Home / Self Care | Payer: Commercial Managed Care - HMO | Source: Ambulatory Visit | Attending: Internal Medicine | Admitting: Internal Medicine

## 2015-11-10 ENCOUNTER — Ambulatory Visit (INDEPENDENT_AMBULATORY_CARE_PROVIDER_SITE_OTHER): Payer: Commercial Managed Care - HMO | Admitting: Internal Medicine

## 2015-11-10 VITALS — BP 118/80 | HR 59 | Temp 97.7°F | Ht 69.0 in | Wt 175.1 lb

## 2015-11-10 DIAGNOSIS — G8929 Other chronic pain: Secondary | ICD-10-CM

## 2015-11-10 DIAGNOSIS — I714 Abdominal aortic aneurysm, without rupture, unspecified: Secondary | ICD-10-CM

## 2015-11-10 DIAGNOSIS — M549 Dorsalgia, unspecified: Secondary | ICD-10-CM

## 2015-11-10 DIAGNOSIS — M47896 Other spondylosis, lumbar region: Secondary | ICD-10-CM | POA: Diagnosis not present

## 2015-11-10 DIAGNOSIS — M545 Low back pain: Secondary | ICD-10-CM

## 2015-11-10 DIAGNOSIS — M533 Sacrococcygeal disorders, not elsewhere classified: Secondary | ICD-10-CM | POA: Diagnosis not present

## 2015-11-10 DIAGNOSIS — M5136 Other intervertebral disc degeneration, lumbar region: Secondary | ICD-10-CM | POA: Diagnosis not present

## 2015-11-10 MED ORDER — TRAMADOL HCL 50 MG PO TABS: 50.0000 mg | ORAL_TABLET | Freq: Two times a day (BID) | ORAL | Status: DC | PRN

## 2015-11-10 NOTE — Patient Instructions (Signed)
Get the x-rays at the first floor  We are scheduling the ultrasound of your aorta  For pain: Tylenol 500 mg tablets 3 times a day as needed If the pain continue, take Ultram one tablet twice a day, watch for drowsiness  DO NOT TAKE ibuprofen, naproxen or similar medications  Come back and see your primary doctor in 2 or 3 weeks  Call if you have increase pain or worsening of your symptoms

## 2015-11-10 NOTE — Progress Notes (Signed)
Pre visit review using our clinic review tool, if applicable. No additional management support is needed unless otherwise documented below in the visit note. 

## 2015-11-10 NOTE — Progress Notes (Signed)
Subjective:    Patient ID: Troy Ray, male    DOB: 11-Jul-1928, 82 y.o.   MRN: [id number]  DOS:  11/10/2015 Type of visit - description : Acute visit Interval history: 1 year history of back pain, the patient reports that previously has seen a chiropractor, had physical therapy and eventually saw Dr. Nelva Bush. Had a local injection 08-2015 and is taking Tylenol as needed. Pain was relatively well controlled until yesterday when he woke up with intense pain at the lower back, some pain also at the right buttock. The pain was worse if he was trying to reach forward. Tylenol did not help much, today the pain is not as severe but not back to baseline.    Review of Systems  Denies fever chills No recent fall or injury No abdominal pain Has bilateral feet tingling which is not a new symptom. No claudication per se No bladder or bowel incontinence, no dysuria or gross hematuria.  Past Medical History  Diagnosis Date  . CAD (coronary artery disease)   . Hypertension   . Chronic kidney disease     chronic, stage II  . Hyperlipidemia   . PVD (peripheral vascular disease) ([place])   . Postural lightheadedness   . Memory loss   . Dysphagia   . Esophageal stricture     Dr [name]  . CRI (chronic renal insufficiency)   . Sensorineural hearing loss, bilateral   . Carotid stenosis, bilateral     right 60-79% stenosed.  Left 40-59%  . Adenomatous colon polyp   . Diverticulosis   . Unspecified constipation 12/29/2012  . BPH (benign prostatic hyperplasia) 07/14/2013  . Headache(784.0) 12/01/2013  . Glaucoma   . Medicare annual wellness visit, subsequent 02/04/2015  . Insomnia 05/12/2015    Past Surgical History  Procedure Laterality Date  . Hernia repair  03/23/05    left inguinal Lichtenstein repair, with mesh.  [name]  . Abdominal aortic aneurysm repair  1996    Dr [name]  . Renal artery stent  1996    Dr [name].  Bilateral stenting  . Coronary artery bypass graft    . Eye  surgery      cataracts b/l and laser    Social History   Social History  . Marital Status: Married    Spouse Name: N/A  . Number of Children: 3  . Years of Education: N/A   Occupational History  . MEDIA ASSISTANT    Social History Main Topics  . Smoking status: Former Research scientist (life sciences)  . Smokeless tobacco: Never Used     Comment: quit 1980  . Alcohol Use: 0.6 oz/week    1 Glasses of wine per week  . Drug Use: No  . Sexual Activity: Yes   Other Topics Concern  . Not on file   Social History Narrative        Medication List       This list is accurate as of: 11/10/15  6:33 PM.  Always use your most recent med list.               aspirin EC 81 MG tablet  Take 81 mg by mouth at bedtime.     atorvastatin 40 MG tablet  Commonly known as:  LIPITOR  Take 1 tablet (40 mg total) by mouth daily.     finasteride 5 MG tablet  Commonly known as:  PROSCAR  Take 1 tablet (5 mg total) by mouth daily.     losartan  50 MG tablet  Commonly known as:  COZAAR  Take 1 tablet (50 mg total) by mouth daily.     LUMIGAN 0.01 % Soln  Generic drug:  bimatoprost     metoprolol succinate 25 MG 24 hr tablet  Commonly known as:  TOPROL-XL  Take 1 tablet (25 mg total) by mouth daily with supper.     pantoprazole 40 MG tablet  Commonly known as:  PROTONIX  Take 1 tablet (40 mg total) by mouth daily.     timolol 0.5 % ophthalmic solution  Commonly known as:  TIMOPTIC  Place 1 drop into both eyes 1 day or 1 dose.     traMADol 50 MG tablet  Commonly known as:  ULTRAM  Take 1 tablet (50 mg total) by mouth every 12 (twelve) hours as needed.           Objective:   Physical Exam BP 118/80 mmHg  Pulse 59  Temp(Src) 97.7 F (36.5 C) (Oral)  Ht 5\' 9"  (1.753 m)  Wt 175 lb 2 oz (79.436 kg)  BMI 25.85 kg/m2  SpO2 98% General:   Well developed, well nourished . NAD.  HEENT:  Normocephalic . Face symmetric, atraumatic  MSK: No TTP of the lumbar spine Abdomen:  Not distended, soft,  non-tender. No rebound or rigidity. No mass, no bruit Skin: Not pale. Not jaundice Neurologic:  alert & oriented X3.  Speech normal, gait appropriate for age and unassisted. DTRs symmetric (bilaterally decrease ankle jerk). Straight leg test negative. Posture antalgic when he laid down at the examining table. Psych--  Cognition and judgment appear intact.  Cooperative with normal attention span and concentration.  Behavior appropriate. No anxious or depressed appearing.    Assessment & Plan:   Acute on chronic back pain 82 year old gentleman with multiple medical problems including a AAA repair more than 10 years ago presented with acute on chronic back pain.  Chart reviewed: X-rays lumbar spine 04/2014: Mild DJD Abdominal ultrasound 08-2014: Aorta partially visualized, not enlarged PSA 2014 < 1.0 Neurological exam is symmetric, likely the pain is related to DJD, plan: Continue with Tylenol, avoid NSAIDs X-ray Aorta ultrasound to be sure AAA repair is sound Low dose of Ultram, warned about drowsiness See PCP in 2-3 weeks. See a VS

## 2015-11-12 ENCOUNTER — Telehealth: Payer: Self-pay | Admitting: Family Medicine

## 2015-11-12 NOTE — Telephone Encounter (Signed)
Tried calling Pt, telephone busy. Will try again later.

## 2015-11-12 NOTE — Telephone Encounter (Signed)
Tried calling Pt again, phone still busy.

## 2015-11-12 NOTE — Telephone Encounter (Signed)
Relation to [id number] Call back [phone] Pharmacy:  Reason for call:  Spouse in need of clarification of why US AORTA was ordered if patient is experiencing back pain, spouse stated she would like to speak with nurse directly

## 2015-11-15 NOTE — Telephone Encounter (Signed)
Spoke with Louella, Pt's wife, and informed her that with Abdominal Aortic Aneursyms, they can sometimes cause lower-mid back pain. Informed her that Dr. Larose Kells was recommending to have that area viewed with the ultrasound to make sure that there is no change. Louella verbalized understanding. Informed her that we sent orders to imaging on 11/11/2015 and to let us know if she has not received a call before tomorrow afternoon. Louella verbalized understanding.

## 2015-11-15 NOTE — Telephone Encounter (Signed)
Patient's wife Toya Smothers) returned your call from last week. She states her phone was out of order and request a call back. [id number]

## 2015-11-16 NOTE — Telephone Encounter (Signed)
Pt and his wife would like to schedule US Aorta. Thank you.

## 2015-11-18 ENCOUNTER — Ambulatory Visit (HOSPITAL_BASED_OUTPATIENT_CLINIC_OR_DEPARTMENT_OTHER)
Admission: RE | Admit: 2015-11-18 | Discharge: 2015-11-18 | Disposition: A | Discharge status: Home / Self Care | Payer: Commercial Managed Care - HMO | Source: Ambulatory Visit | Attending: Internal Medicine | Admitting: Internal Medicine

## 2015-11-18 DIAGNOSIS — I714 Abdominal aortic aneurysm, without rupture: Secondary | ICD-10-CM | POA: Insufficient documentation

## 2015-11-18 DIAGNOSIS — M549 Dorsalgia, unspecified: Secondary | ICD-10-CM | POA: Insufficient documentation

## 2015-11-18 DIAGNOSIS — I77811 Abdominal aortic ectasia: Secondary | ICD-10-CM | POA: Diagnosis not present

## 2015-11-19 ENCOUNTER — Other Ambulatory Visit: Payer: Self-pay | Admitting: Family Medicine

## 2015-11-19 MED ORDER — ATORVASTATIN CALCIUM 40 MG PO TABS: 40.0000 mg | ORAL_TABLET | Freq: Every day | ORAL | Status: DC

## 2015-11-23 ENCOUNTER — Telehealth: Payer: Self-pay | Admitting: Family Medicine

## 2015-11-23 NOTE — Telephone Encounter (Signed)
As per patient never spoke with anyone regarding U/S results and would like to speak with some as soon as possible. Please advise

## 2015-11-23 NOTE — Telephone Encounter (Signed)
So if she is that worried he should come back in to see me and discuss so I can order further testing, I have not seen him since September. He has lost 7 pounds since March and actually if you go back to October of 2015 he has only lost 4 pounds in our system. If he is in that much pain though if he comes in we can order either a CT or MRI. So far [place] and xrays are negative for any significant disease.  Many things can cause pain

## 2015-11-23 NOTE — Telephone Encounter (Signed)
Notes Recorded by Damita Dunnings, CMA on 11/18/2015 at 1:21 PM Letter printed and mailed to Pt. ------  Notes Recorded by Colon Branch, MD on 11/18/2015 at 1:18 PM Advise patient, aorta looks good without an aneurysm at this point

## 2015-11-23 NOTE — Telephone Encounter (Signed)
Called and spoke to wife.  She was very upset that we haven't been able to find anything wrong with her husband.  She says that patient continues to experience severe back pain, nausea, loss of appetite, weight loss (of 13 lbs in 1 year) and constipation.  She says that she has experience cancer herself and he seems to be experiencing similar symptoms.  She says that she would like something done.  She is aware that Dr. Charlett Blake is out of the office tomorrow and she may not hear anything back before Thursday.    Please advise.

## 2015-11-23 NOTE — Telephone Encounter (Signed)
Relation to [id number] Call back number:[phone]   Reason for call:  Patient inquiring about U/S results

## 2015-11-24 NOTE — Telephone Encounter (Signed)
Spoke to wife and she agrees with plan.  Appt scheduled for 12/10/15 at 3pm (next available appt) with Dr. Charlett Blake.

## 2015-12-10 ENCOUNTER — Encounter: Payer: Self-pay | Admitting: Family Medicine

## 2015-12-10 ENCOUNTER — Ambulatory Visit (INDEPENDENT_AMBULATORY_CARE_PROVIDER_SITE_OTHER): Payer: Commercial Managed Care - HMO | Admitting: Family Medicine

## 2015-12-10 VITALS — BP 160/80 | HR 59 | Temp 98.3°F | Ht 69.0 in | Wt 174.5 lb

## 2015-12-10 DIAGNOSIS — R634 Abnormal weight loss: Secondary | ICD-10-CM

## 2015-12-10 DIAGNOSIS — R630 Anorexia: Secondary | ICD-10-CM | POA: Diagnosis not present

## 2015-12-10 DIAGNOSIS — I10 Essential (primary) hypertension: Secondary | ICD-10-CM

## 2015-12-10 DIAGNOSIS — K5900 Constipation, unspecified: Secondary | ICD-10-CM

## 2015-12-10 DIAGNOSIS — M545 Low back pain: Secondary | ICD-10-CM

## 2015-12-10 DIAGNOSIS — N182 Chronic kidney disease, stage 2 (mild): Secondary | ICD-10-CM

## 2015-12-10 DIAGNOSIS — M5441 Lumbago with sciatica, right side: Secondary | ICD-10-CM

## 2015-12-10 DIAGNOSIS — R110 Nausea: Secondary | ICD-10-CM

## 2015-12-10 LAB — COMPREHENSIVE METABOLIC PANEL
ALBUMIN: 4.2 g/dL (ref 3.6–5.1)
ALT: 14 U/L (ref 9–46)
AST: 20 U/L (ref 10–35)
Alkaline Phosphatase: 47 U/L (ref 40–115)
BUN: 20 mg/dL (ref 7–25)
CO2: 26 mmol/L (ref 20–31)
CREATININE: 1.25 mg/dL — AB (ref 0.70–1.11)
Calcium: 9.7 mg/dL (ref 8.6–10.3)
Chloride: 102 mmol/L (ref 98–110)
Glucose, Bld: 91 mg/dL (ref 65–99)
POTASSIUM: 4.6 mmol/L (ref 3.5–5.3)
SODIUM: 139 mmol/L (ref 135–146)
Total Bilirubin: 0.6 mg/dL (ref 0.2–1.2)
Total Protein: 7.5 g/dL (ref 6.1–8.1)

## 2015-12-10 LAB — AMYLASE: Amylase: 45 U/L (ref 0–105)

## 2015-12-10 LAB — CBC WITH DIFFERENTIAL/PLATELET
BASOS ABS: 0 10*3/uL (ref 0.0–0.1)
Basophils Relative: 0 % (ref 0–1)
EOS PCT: 3 % (ref 0–5)
Eosinophils Absolute: 0.3 10*3/uL (ref 0.0–0.7)
HEMATOCRIT: 45.6 % (ref 39.0–52.0)
HEMOGLOBIN: 15.3 g/dL (ref 13.0–17.0)
LYMPHS ABS: 3.1 10*3/uL (ref 0.7–4.0)
LYMPHS PCT: 29 % (ref 12–46)
MCH: 30.6 pg (ref 26.0–34.0)
MCHC: 33.6 g/dL (ref 30.0–36.0)
MCV: 91.2 fL (ref 78.0–100.0)
MPV: 9.7 fL (ref 8.6–12.4)
Monocytes Absolute: 1.1 10*3/uL — ABNORMAL HIGH (ref 0.1–1.0)
Monocytes Relative: 10 % (ref 3–12)
NEUTROS ABS: 6.2 10*3/uL (ref 1.7–7.7)
Neutrophils Relative %: 58 % (ref 43–77)
Platelets: 182 10*3/uL (ref 150–400)
RBC: 5 MIL/uL (ref 4.22–5.81)
RDW: 15 % (ref 11.5–15.5)
WBC: 10.7 10*3/uL — ABNORMAL HIGH (ref 4.0–10.5)

## 2015-12-10 LAB — LIPASE: LIPASE: 27 U/L (ref 7–60)

## 2015-12-10 MED ORDER — LOSARTAN POTASSIUM 50 MG PO TABS: 50.0000 mg | ORAL_TABLET | Freq: Two times a day (BID) | ORAL | Status: DC

## 2015-12-10 MED ORDER — BACLOFEN 10 MG PO TABS: 10.0000 mg | ORAL_TABLET | Freq: Two times a day (BID) | ORAL | Status: DC

## 2015-12-10 NOTE — Progress Notes (Signed)
Subjective:    Patient ID: Troy Ray, male    DOB: 28-Mar-1928, 82 y.o.   MRN: [id number]  Chief Complaint  Patient presents with  . Back Pain  . Nausea  . Anorexia    HPI Patient is in today for follow up on back pain, hypertension and anorexia. His wife accompanies him and notes he struggles with nausea frequently and vomiting infrequently. She endorses persistent anorexia but he reports he gets hungry in am but then fills up for the day. Has been struggling to move his bowels most days, no bloody or tarry stool just going less frequently. Has also had flares in back pain with sciatica noted as well. Denies CP/palp/SOB/HA/congestion/fevers or GU c/o. Taking meds as prescribed  Past Medical History  Diagnosis Date  . CAD (coronary artery disease)   . Hypertension   . Chronic kidney disease     chronic, stage II  . Hyperlipidemia   . PVD (peripheral vascular disease) ([place])   . Postural lightheadedness   . Memory loss   . Dysphagia   . Esophageal stricture     Dr [name]  . CRI (chronic renal insufficiency)   . Sensorineural hearing loss, bilateral   . Carotid stenosis, bilateral     right 60-79% stenosed.  Left 40-59%  . Adenomatous colon polyp   . Diverticulosis   . Unspecified constipation 12/29/2012  . BPH (benign prostatic hyperplasia) 07/14/2013  . Headache(784.0) 12/01/2013  . Glaucoma   . Medicare annual wellness visit, subsequent 02/04/2015  . Insomnia 05/12/2015    Past Surgical History  Procedure Laterality Date  . Hernia repair  03/23/05    left inguinal Lichtenstein repair, with mesh.  [name]  . Abdominal aortic aneurysm repair  1996    Dr [name]  . Renal artery stent  1996    Dr [name].  Bilateral stenting  . Coronary artery bypass graft    . Eye surgery      cataracts b/l and laser    Family History  Problem Relation Age of Onset  . Heart disease Father     CAD  . Cancer Neg Hx   . Diabetes Neg Hx   . Coronary artery disease Father      Social History   Social History  . Marital Status: Married    Spouse Name: N/A  . Number of Children: 3  . Years of Education: N/A   Occupational History  . MEDIA ASSISTANT    Social History Main Topics  . Smoking status: Former Research scientist (life sciences)  . Smokeless tobacco: Never Used     Comment: quit 1980  . Alcohol Use: 0.6 oz/week    1 Glasses of wine per week  . Drug Use: No  . Sexual Activity: Yes   Other Topics Concern  . Not on file   Social History Narrative    Outpatient Prescriptions Prior to Visit  Medication Sig Dispense Refill  . aspirin EC 81 MG EC tablet Take 81 mg by mouth at bedtime.     [name] Kitchen atorvastatin (LIPITOR) 40 MG tablet Take 1 tablet (40 mg total) by mouth daily. 90 tablet 1  . finasteride (PROSCAR) 5 MG tablet Take 1 tablet (5 mg total) by mouth daily. 90 tablet 2  . LUMIGAN 0.01 % SOLN     . metoprolol succinate (TOPROL-XL) 25 MG 24 hr tablet Take 1 tablet (25 mg total) by mouth daily with supper. 90 tablet 2  . pantoprazole (PROTONIX) 40 MG tablet Take  1 tablet (40 mg total) by mouth daily. 90 tablet 2  . timolol (TIMOPTIC) 0.5 % ophthalmic solution Place 1 drop into both eyes 1 day or 1 dose.    . traMADol (ULTRAM) 50 MG tablet Take 1 tablet (50 mg total) by mouth every 12 (twelve) hours as needed. 30 tablet 0  . losartan (COZAAR) 50 MG tablet Take 1 tablet (50 mg total) by mouth daily. 90 tablet 2   No facility-administered medications prior to visit.    No Known Allergies  Review of Systems  Constitutional: Negative for fever and malaise/fatigue.  HENT: Negative for congestion.   Eyes: Negative for discharge.  Respiratory: Negative for shortness of breath.   Cardiovascular: Negative for chest pain, palpitations and leg swelling.  Gastrointestinal: Positive for nausea and constipation. Negative for abdominal pain and diarrhea.  Genitourinary: Negative for dysuria.  Musculoskeletal: Positive for back pain and joint pain. Negative for falls.  Skin:  Negative for rash.  Neurological: Negative for loss of consciousness and headaches.  Endo/Heme/Allergies: Negative for environmental allergies.  Psychiatric/Behavioral: Negative for depression. The patient is nervous/anxious.        Objective:    Physical Exam  Constitutional: He is oriented to person, place, and time. He appears well-developed and well-nourished. No distress.  HENT:  Head: Normocephalic and atraumatic.  Nose: Nose normal.  Eyes: Right eye exhibits no discharge. Left eye exhibits no discharge.  Neck: Normal range of motion. Neck supple.  Cardiovascular: Normal rate and regular rhythm.   Pulmonary/Chest: Effort normal and breath sounds normal.  Abdominal: Soft. Bowel sounds are normal. There is no tenderness.  Musculoskeletal: He exhibits no edema.  Neurological: He is alert and oriented to person, place, and time.  Skin: Skin is warm and dry.  Psychiatric: He has a normal mood and affect.  Nursing note and vitals reviewed.   BP 160/80 mmHg  Pulse 59  Temp(Src) 98.3 F (36.8 C) (Oral)  Ht 5\' 9"  (1.753 m)  Wt 174 lb 8 oz (79.153 kg)  BMI 25.76 kg/m2  SpO2 100% Wt Readings from Last 3 Encounters:  12/10/15 174 lb 8 oz (79.153 kg)  11/10/15 175 lb 2 oz (79.436 kg)  07/29/15 177 lb 4 oz (80.4 kg)     Lab Results  Component Value Date   WBC 10.7* 12/10/2015   HGB 15.3 12/10/2015   HCT 45.6 12/10/2015   PLT 182 12/10/2015   GLUCOSE 91 12/10/2015   CHOL 143 07/29/2015   TRIG 136.0 07/29/2015   HDL 37.70* 07/29/2015   LDLCALC 78 07/29/2015   ALT 14 12/10/2015   AST 20 12/10/2015   NA 139 12/10/2015   K 4.6 12/10/2015   CL 102 12/10/2015   CREATININE 1.25* 12/10/2015   BUN 20 12/10/2015   CO2 26 12/10/2015   TSH 4.00 07/29/2015   PSA 0.75 10/06/2013   INR 1.06 10/27/2010   HGBA1C 6.2 07/29/2015   MICROALBUR 0.87 10/06/2013    Lab Results  Component Value Date   TSH 4.00 07/29/2015   Lab Results  Component Value Date   WBC 10.7*  12/10/2015   HGB 15.3 12/10/2015   HCT 45.6 12/10/2015   MCV 91.2 12/10/2015   PLT 182 12/10/2015   Lab Results  Component Value Date   NA 139 12/10/2015   K 4.6 12/10/2015   CO2 26 12/10/2015   GLUCOSE 91 12/10/2015   BUN 20 12/10/2015   CREATININE 1.25* 12/10/2015   BILITOT 0.6 12/10/2015   ALKPHOS 47 12/10/2015  AST 20 12/10/2015   ALT 14 12/10/2015   PROT 7.5 12/10/2015   ALBUMIN 4.2 12/10/2015   CALCIUM 9.7 12/10/2015   ANIONGAP 15 08/22/2014   GFR 56.72* 07/29/2015   Lab Results  Component Value Date   CHOL 143 07/29/2015   Lab Results  Component Value Date   HDL 37.70* 07/29/2015   Lab Results  Component Value Date   LDLCALC 78 07/29/2015   Lab Results  Component Value Date   TRIG 136.0 07/29/2015   Lab Results  Component Value Date   CHOLHDL 4 07/29/2015   Lab Results  Component Value Date   HGBA1C 6.2 07/29/2015       Assessment & Plan:   Problem List Items Addressed This Visit    Anorexia    Gets hungry especially in am but then fills up quickly and does not get hungry later in the day. Will proceed with imaging due to symptoms persistnet.      Relevant Orders   CT Abdomen Pelvis Wo Contrast   Comprehensive metabolic panel (Completed)   CBC w/Diff (Completed)   Amylase (Completed)   Lipase (Completed)   CHRONIC KIDNEY DISEASE STAGE II (MILD)    No significant change today repeat cmp in 2-3 weeks      Constipation    Encouraged increased hydration and fiber in diet. Daily probiotics. If bowels not moving can use MOM 2 tbls po in 4 oz of warm prune juice by mouth every 2-3 days. If no results then repeat in 4 hours with  Dulcolax suppository pr, may repeat again in 4 more hours as needed. Seek care if symptoms worsen. Consider daily Miralax and/or Dulcolax if symptoms persist.       Essential hypertension    Increase Losartan to bid and monitor      Relevant Medications   losartan (COZAAR) 50 MG tablet   Low back pain with  right-sided sciatica    Encouraged topical treatments, may use Tramadol prn for severe pain and given an rx for Baclofen for severe pain as well to try      Relevant Medications   baclofen (LIORESAL) 10 MG tablet    Other Visit Diagnoses    Nausea without vomiting    -  Primary    Relevant Orders    CT Abdomen Pelvis Wo Contrast    Comprehensive metabolic panel (Completed)    CBC w/Diff (Completed)    Amylase (Completed)    Lipase (Completed)    Low back pain, unspecified back pain laterality, with sciatica presence unspecified        Relevant Medications    baclofen (LIORESAL) 10 MG tablet    Other Relevant Orders    CT Abdomen Pelvis Wo Contrast    Comprehensive metabolic panel (Completed)    CBC w/Diff (Completed)    Amylase (Completed)    Lipase (Completed)    Loss of weight        Relevant Orders    CT Abdomen Pelvis Wo Contrast    Comprehensive metabolic panel (Completed)    CBC w/Diff (Completed)    Amylase (Completed)    Lipase (Completed)       I have changed Mr. Gilroy's losartan. I am also having him start on baclofen. Additionally, I am having him maintain his aspirin EC, LUMIGAN, timolol, finasteride, metoprolol succinate, pantoprazole, traMADol, and atorvastatin.  Meds ordered this encounter  Medications  . losartan (COZAAR) 50 MG tablet    Sig: Take 1 tablet (50 mg total)  by mouth 2 (two) times daily.    Dispense:  90 tablet    Refill:  2    D/C PREVIOUS SCRIPTS FOR THIS MEDICATION  . baclofen (LIORESAL) 10 MG tablet    Sig: Take 1 tablet (10 mg total) by mouth 2 (two) times daily.    Dispense:  30 each    Refill:  Kline, MD

## 2015-12-10 NOTE — Patient Instructions (Addendum)
Salonpas patches with lidocane as needed for back pain.  Back Pain, Adult  Back pain is very common in adults.The cause of back pain is rarely dangerous and the pain often gets better over time.The cause of your back pain may not be known. Some common causes of back pain include:  Strain of the muscles or ligaments supporting the spine.  Wear and tear (degeneration) of the spinal disks.  Arthritis.  Direct injury to the back. For many people, back pain may return. Since back pain is rarely dangerous, most people can learn to manage this condition on their own. HOME CARE INSTRUCTIONS Watch your back pain for any changes. The following actions may help to lessen any discomfort you are feeling:  Remain active. It is stressful on your back to sit or stand in one place for long periods of time. Do not sit, drive, or stand in one place for more than 30 minutes at a time. Take short walks on even surfaces as soon as you are able.Try to increase the length of time you walk each day.  Exercise regularly as directed by your health care provider. Exercise helps your back heal faster. It also helps avoid future injury by keeping your muscles strong and flexible.  Do not stay in bed.Resting more than 1-2 days can delay your recovery.  Pay attention to your body when you bend and lift. The most comfortable positions are those that put less stress on your recovering back. Always use proper lifting techniques, including:  Bending your knees.  Keeping the load close to your body.  Avoiding twisting.  Find a comfortable position to sleep. Use a firm mattress and lie on your side with your knees slightly bent. If you lie on your back, put a pillow under your knees.  Avoid feeling anxious or stressed.Stress increases muscle tension and can worsen back pain.It is important to recognize when you are anxious or stressed and learn ways to manage it, such as with exercise.  Take medicines only as  directed by your health care provider. Over-the-counter medicines to reduce pain and inflammation are often the most helpful.Your health care provider may prescribe muscle relaxant drugs.These medicines help dull your pain so you can more quickly return to your normal activities and healthy exercise.  Apply ice to the injured area:  Put ice in a plastic bag.  Place a towel between your skin and the bag.  Leave the ice on for 20 minutes, 2-3 times a day for the first 2-3 days. After that, ice and heat may be alternated to reduce pain and spasms.  Maintain a healthy weight. Excess weight puts extra stress on your back and makes it difficult to maintain good posture. SEEK MEDICAL CARE IF:  You have pain that is not relieved with rest or medicine.  You have increasing pain going down into the legs or buttocks.  You have pain that does not improve in one week.  You have night pain.  You lose weight.  You have a fever or chills. SEEK IMMEDIATE MEDICAL CARE IF:   You develop new bowel or bladder control problems.  You have unusual weakness or numbness in your arms or legs.  You develop nausea or vomiting.  You develop abdominal pain.  You feel faint.   This information is not intended to replace advice given to you by your health care provider. Make sure you discuss any questions you have with your health care provider.   Document Released: 10/23/2005 Document  Revised: 11/13/2014 Document Reviewed: 02/24/2014 Elsevier Interactive Patient Education [employer].

## 2015-12-10 NOTE — Progress Notes (Signed)
Pre visit review using our clinic review tool, if applicable. No additional management support is needed unless otherwise documented below in the visit note. 

## 2015-12-12 DIAGNOSIS — R630 Anorexia: Secondary | ICD-10-CM | POA: Insufficient documentation

## 2015-12-12 NOTE — Assessment & Plan Note (Signed)
Encouraged increased hydration and fiber in diet. Daily probiotics. If bowels not moving can use MOM 2 tbls po in 4 oz of warm prune juice by mouth every 2-3 days. If no results then repeat in 4 hours with  Dulcolax suppository pr, may repeat again in 4 more hours as needed. Seek care if symptoms worsen. Consider daily Miralax and/or Dulcolax if symptoms persist.  

## 2015-12-12 NOTE — Assessment & Plan Note (Signed)
No significant change today repeat cmp in 2-3 weeks

## 2015-12-12 NOTE — Assessment & Plan Note (Addendum)
Encouraged topical treatments, may use Tramadol prn for severe pain and given an rx for Baclofen for severe pain as well to try

## 2015-12-12 NOTE — Assessment & Plan Note (Signed)
Gets hungry especially in am but then fills up quickly and does not get hungry later in the day. Will proceed with imaging due to symptoms persistnet.

## 2015-12-12 NOTE — Assessment & Plan Note (Signed)
Increase Losartan to bid and monitor

## 2015-12-13 ENCOUNTER — Other Ambulatory Visit: Payer: Self-pay | Admitting: Family Medicine

## 2015-12-13 DIAGNOSIS — R630 Anorexia: Secondary | ICD-10-CM

## 2015-12-13 DIAGNOSIS — R112 Nausea with vomiting, unspecified: Secondary | ICD-10-CM

## 2015-12-13 DIAGNOSIS — D72829 Elevated white blood cell count, unspecified: Secondary | ICD-10-CM

## 2015-12-13 DIAGNOSIS — R634 Abnormal weight loss: Secondary | ICD-10-CM

## 2015-12-13 DIAGNOSIS — M549 Dorsalgia, unspecified: Secondary | ICD-10-CM

## 2015-12-17 ENCOUNTER — Ambulatory Visit (HOSPITAL_BASED_OUTPATIENT_CLINIC_OR_DEPARTMENT_OTHER)
Admission: RE | Admit: 2015-12-17 | Discharge: 2015-12-17 | Disposition: A | Discharge status: Home / Self Care | Payer: Commercial Managed Care - HMO | Source: Ambulatory Visit | Attending: Family Medicine | Admitting: Family Medicine

## 2015-12-17 ENCOUNTER — Other Ambulatory Visit: Payer: Commercial Managed Care - HMO

## 2015-12-17 ENCOUNTER — Encounter (HOSPITAL_BASED_OUTPATIENT_CLINIC_OR_DEPARTMENT_OTHER): Payer: Self-pay

## 2015-12-17 DIAGNOSIS — R634 Abnormal weight loss: Secondary | ICD-10-CM | POA: Diagnosis not present

## 2015-12-17 DIAGNOSIS — N261 Atrophy of kidney (terminal): Secondary | ICD-10-CM | POA: Insufficient documentation

## 2015-12-17 DIAGNOSIS — M549 Dorsalgia, unspecified: Secondary | ICD-10-CM | POA: Diagnosis not present

## 2015-12-17 DIAGNOSIS — R630 Anorexia: Secondary | ICD-10-CM | POA: Insufficient documentation

## 2015-12-17 DIAGNOSIS — I700 Atherosclerosis of aorta: Secondary | ICD-10-CM | POA: Diagnosis not present

## 2015-12-17 DIAGNOSIS — R112 Nausea with vomiting, unspecified: Secondary | ICD-10-CM | POA: Insufficient documentation

## 2015-12-17 DIAGNOSIS — K5730 Diverticulosis of large intestine without perforation or abscess without bleeding: Secondary | ICD-10-CM | POA: Insufficient documentation

## 2015-12-17 DIAGNOSIS — N400 Enlarged prostate without lower urinary tract symptoms: Secondary | ICD-10-CM | POA: Insufficient documentation

## 2015-12-17 HISTORY — DX: Disorder of kidney and ureter, unspecified: N28.9

## 2015-12-17 MED ORDER — IOHEXOL 300 MG/ML  SOLN
80.0000 mL | Freq: Once | INTRAMUSCULAR | Status: AC | PRN
  Administered 2015-12-17: 80 mL via INTRAVENOUS

## 2015-12-20 ENCOUNTER — Other Ambulatory Visit (INDEPENDENT_AMBULATORY_CARE_PROVIDER_SITE_OTHER): Payer: Commercial Managed Care - HMO

## 2015-12-20 DIAGNOSIS — D72829 Elevated white blood cell count, unspecified: Secondary | ICD-10-CM | POA: Diagnosis not present

## 2015-12-20 LAB — CBC WITH DIFFERENTIAL/PLATELET
BASOS PCT: 0.4 % (ref 0.0–3.0)
Basophils Absolute: 0 10*3/uL (ref 0.0–0.1)
EOS ABS: 0.2 10*3/uL (ref 0.0–0.7)
EOS PCT: 2.6 % (ref 0.0–5.0)
HEMATOCRIT: 46.6 % (ref 39.0–52.0)
HEMOGLOBIN: 15.1 g/dL (ref 13.0–17.0)
LYMPHS PCT: 30.1 % (ref 12.0–46.0)
Lymphs Abs: 2.8 10*3/uL (ref 0.7–4.0)
MCHC: 32.3 g/dL (ref 30.0–36.0)
MCV: 92.7 fl (ref 78.0–100.0)
Monocytes Absolute: 0.8 10*3/uL (ref 0.1–1.0)
Monocytes Relative: 8.3 % (ref 3.0–12.0)
Neutro Abs: 5.5 10*3/uL (ref 1.4–7.7)
Neutrophils Relative %: 58.6 % (ref 43.0–77.0)
PLATELETS: 232 10*3/uL (ref 150.0–400.0)
RBC: 5.02 Mil/uL (ref 4.22–5.81)
RDW: 15.1 % (ref 11.5–15.5)
WBC: 9.5 10*3/uL (ref 4.0–10.5)

## 2016-01-31 ENCOUNTER — Telehealth: Payer: Self-pay

## 2016-02-01 ENCOUNTER — Ambulatory Visit (INDEPENDENT_AMBULATORY_CARE_PROVIDER_SITE_OTHER): Payer: Commercial Managed Care - HMO | Admitting: Family Medicine

## 2016-02-01 ENCOUNTER — Encounter: Payer: Self-pay | Admitting: Family Medicine

## 2016-02-01 VITALS — BP 140/78 | HR 57 | Temp 97.6°F | Ht 69.0 in | Wt 173.0 lb

## 2016-02-01 DIAGNOSIS — R739 Hyperglycemia, unspecified: Secondary | ICD-10-CM

## 2016-02-01 DIAGNOSIS — Z23 Encounter for immunization: Secondary | ICD-10-CM

## 2016-02-01 DIAGNOSIS — Z0000 Encounter for general adult medical examination without abnormal findings: Secondary | ICD-10-CM | POA: Diagnosis not present

## 2016-02-01 DIAGNOSIS — I779 Disorder of arteries and arterioles, unspecified: Secondary | ICD-10-CM | POA: Diagnosis not present

## 2016-02-01 DIAGNOSIS — I10 Essential (primary) hypertension: Secondary | ICD-10-CM | POA: Diagnosis not present

## 2016-02-01 DIAGNOSIS — K219 Gastro-esophageal reflux disease without esophagitis: Secondary | ICD-10-CM

## 2016-02-01 DIAGNOSIS — E785 Hyperlipidemia, unspecified: Secondary | ICD-10-CM | POA: Diagnosis not present

## 2016-02-01 DIAGNOSIS — N400 Enlarged prostate without lower urinary tract symptoms: Secondary | ICD-10-CM

## 2016-02-01 DIAGNOSIS — I739 Peripheral vascular disease, unspecified: Secondary | ICD-10-CM

## 2016-02-01 DIAGNOSIS — M5441 Lumbago with sciatica, right side: Secondary | ICD-10-CM

## 2016-02-01 LAB — COMPREHENSIVE METABOLIC PANEL
ALT: 15 U/L (ref 0–53)
AST: 19 U/L (ref 0–37)
Albumin: 4.2 g/dL (ref 3.5–5.2)
Alkaline Phosphatase: 46 U/L (ref 39–117)
BILIRUBIN TOTAL: 0.7 mg/dL (ref 0.2–1.2)
BUN: 22 mg/dL (ref 6–23)
CO2: 25 meq/L (ref 19–32)
CREATININE: 1.15 mg/dL (ref 0.40–1.50)
Calcium: 9.7 mg/dL (ref 8.4–10.5)
Chloride: 104 mEq/L (ref 96–112)
GFR: 64.11 mL/min (ref >60.00)
GLUCOSE: 102 mg/dL — AB (ref 70–99)
POTASSIUM: 4.4 meq/L (ref 3.5–5.1)
SODIUM: 138 meq/L (ref 135–145)
TOTAL PROTEIN: 7.7 g/dL (ref 6.0–8.3)

## 2016-02-01 LAB — CBC
HCT: 45.3 % (ref 39.0–52.0)
HEMOGLOBIN: 15.1 g/dL (ref 13.0–17.0)
MCHC: 33.3 g/dL (ref 30.0–36.0)
MCV: 91.9 fl (ref 78.0–100.0)
Platelets: 231 10*3/uL (ref 150.0–400.0)
RBC: 4.93 Mil/uL (ref 4.22–5.81)
RDW: 14.8 % (ref 11.5–15.5)
WBC: 9.4 10*3/uL (ref 4.0–10.5)

## 2016-02-01 LAB — LIPID PANEL
CHOL/HDL RATIO: 4
Cholesterol: 149 mg/dL (ref 0–200)
HDL: 40.3 mg/dL (ref >39.00)
LDL CALC: 82 mg/dL (ref 0–99)
NonHDL: 108.44
TRIGLYCERIDES: 134 mg/dL (ref 0.0–149.0)
VLDL: 26.8 mg/dL (ref 0.0–40.0)

## 2016-02-01 LAB — HEMOGLOBIN A1C: Hgb A1c MFr Bld: 6.1 % (ref 4.6–6.5)

## 2016-02-01 LAB — TSH: TSH: 2.48 u[IU]/mL (ref 0.35–4.50)

## 2016-02-01 MED ORDER — LOSARTAN POTASSIUM 100 MG PO TABS: 100.0000 mg | ORAL_TABLET | Freq: Every day | ORAL | Status: DC

## 2016-02-01 MED ORDER — ACETAMINOPHEN 500 MG PO TABS
1000.0000 mg | ORAL_TABLET | Freq: Two times a day (BID) | ORAL | Status: DC
Start: 2016-02-01 — End: 2017-10-31

## 2016-02-01 NOTE — Assessment & Plan Note (Signed)
Avoid offending foods, start probiotics. Do not eat large meals in late evening and consider raising head of bed.  

## 2016-02-01 NOTE — Progress Notes (Signed)
Subjective:    Patient ID: Troy Ray, male    DOB: 29-Dec-1927, 82 y.o.   MRN: [id number]  Chief Complaint  Patient presents with  . Medicare Wellness    HPI Patient is in today for Medicare wellness visit.  Patient has glaucama that he has been following up with Dr. Bing Plume for vision.  Patient still has trouble understanding and hearing even with hearing aid.  Patient reports he does not sleep well due to frequent urination, but does not want to see urologist., also states his legs are bothersome sometimes during walking but back pain has gotten better.    Past Medical History  Diagnosis Date  . CAD (coronary artery disease)   . Hypertension   . Chronic kidney disease     chronic, stage II  . Hyperlipidemia   . PVD (peripheral vascular disease) ([place])   . Postural lightheadedness   . Memory loss   . Dysphagia   . Esophageal stricture     Dr [name]  . CRI (chronic renal insufficiency)   . Sensorineural hearing loss, bilateral   . Carotid stenosis, bilateral     right 60-79% stenosed.  Left 40-59%  . Adenomatous colon polyp   . Diverticulosis   . Unspecified constipation 12/29/2012  . BPH (benign prostatic hyperplasia) 07/14/2013  . Headache(784.0) 12/01/2013  . Glaucoma   . Medicare annual wellness visit, subsequent 02/04/2015  . Insomnia 05/12/2015  . Renal insufficiency   . Low back pain syndrome 04/03/2014    Past Surgical History  Procedure Laterality Date  . Hernia repair  03/23/05    left inguinal Lichtenstein repair, with mesh.  [name]  . Abdominal aortic aneurysm repair  1996    Dr [name]  . Renal artery stent  1996    Dr [name].  Bilateral stenting  . Coronary artery bypass graft    . Eye surgery      cataracts b/l and laser    Family History  Problem Relation Age of Onset  . Heart disease Father     CAD  . Coronary artery disease Father   . Stroke Father   . Cancer Neg Hx   . Diabetes Neg Hx   . Heart disease Son   . Heart disease  Brother     MI    Social History   Social History  . Marital Status: Married    Spouse Name: N/A  . Number of Children: 3  . Years of Education: N/A   Occupational History  . MEDIA ASSISTANT    Social History Main Topics  . Smoking status: Former Research scientist (life sciences)  . Smokeless tobacco: Never Used     Comment: quit 1980  . Alcohol Use: 0.6 oz/week    1 Glasses of wine per week  . Drug Use: No  . Sexual Activity: Yes     Comment: lives with wife, retired, no dietary restriction   Other Topics Concern  . Not on file   Social History Narrative    Outpatient Prescriptions Prior to Visit  Medication Sig Dispense Refill  . aspirin EC 81 MG EC tablet Take 81 mg by mouth at bedtime.     [name] Kitchen atorvastatin (LIPITOR) 40 MG tablet Take 1 tablet (40 mg total) by mouth daily. 90 tablet 1  . baclofen (LIORESAL) 10 MG tablet Take 1 tablet (10 mg total) by mouth 2 (two) times daily. 30 each 1  . finasteride (PROSCAR) 5 MG tablet Take 1 tablet (5 mg total)  by mouth daily. 90 tablet 2  . LUMIGAN 0.01 % SOLN     . metoprolol succinate (TOPROL-XL) 25 MG 24 hr tablet Take 1 tablet (25 mg total) by mouth daily with supper. 90 tablet 2  . pantoprazole (PROTONIX) 40 MG tablet Take 1 tablet (40 mg total) by mouth daily. 90 tablet 2  . timolol (TIMOPTIC) 0.5 % ophthalmic solution Place 1 drop into both eyes 1 day or 1 dose.    . traMADol (ULTRAM) 50 MG tablet Take 1 tablet (50 mg total) by mouth every 12 (twelve) hours as needed. 30 tablet 0  . losartan (COZAAR) 50 MG tablet Take 1 tablet (50 mg total) by mouth 2 (two) times daily. 90 tablet 2   No facility-administered medications prior to visit.    No Known Allergies  Review of Systems  Constitutional: Negative for fever and malaise/fatigue.  HENT: Negative for congestion.   Eyes: Negative for blurred vision.  Respiratory: Negative for shortness of breath.   Cardiovascular: Negative for chest pain, palpitations and leg swelling.  Gastrointestinal:  Negative for nausea, abdominal pain and blood in stool.  Genitourinary: Positive for frequency. Negative for dysuria.  Musculoskeletal: Negative for falls.  Skin: Negative for rash.  Neurological: Negative for dizziness, loss of consciousness and headaches.  Endo/Heme/Allergies: Negative for environmental allergies.  Psychiatric/Behavioral: Negative for depression. The patient is not nervous/anxious.        Objective:    Physical Exam  Constitutional: He is oriented to person, place, and time. He appears well-developed and well-nourished. No distress.  HENT:  Head: Normocephalic and atraumatic.  Eyes: Conjunctivae are normal.  Neck: Neck supple. No thyromegaly present.  Cardiovascular: Normal rate and regular rhythm.   Murmur heard. Carotid bruit l>r  Pulmonary/Chest: Effort normal and breath sounds normal. No respiratory distress. He has no wheezes.  Abdominal: Soft. Bowel sounds are normal. He exhibits no mass. There is no tenderness.  Musculoskeletal: He exhibits no edema.  Lymphadenopathy:    He has no cervical adenopathy.  Neurological: He is alert and oriented to person, place, and time.  Skin: Skin is warm and dry.  Psychiatric: He has a normal mood and affect. His behavior is normal.    BP 140/78 mmHg  Pulse 57  Temp(Src) 97.6 F (36.4 C) (Oral)  Ht 5\' 9"  (1.753 m)  Wt 173 lb (78.472 kg)  BMI 25.54 kg/m2  SpO2 98% Wt Readings from Last 3 Encounters:  02/01/16 173 lb (78.472 kg)  12/10/15 174 lb 8 oz (79.153 kg)  11/10/15 175 lb 2 oz (79.436 kg)     Lab Results  Component Value Date   WBC 9.5 12/20/2015   HGB 15.1 12/20/2015   HCT 46.6 12/20/2015   PLT 232.0 12/20/2015   GLUCOSE 91 12/10/2015   CHOL 143 07/29/2015   TRIG 136.0 07/29/2015   HDL 37.70* 07/29/2015   LDLCALC 78 07/29/2015   ALT 14 12/10/2015   AST 20 12/10/2015   NA 139 12/10/2015   K 4.6 12/10/2015   CL 102 12/10/2015   CREATININE 1.25* 12/10/2015   BUN 20 12/10/2015   CO2 26  12/10/2015   TSH 4.00 07/29/2015   PSA 0.75 10/06/2013   INR 1.06 10/27/2010   HGBA1C 6.2 07/29/2015   MICROALBUR 0.87 10/06/2013    Lab Results  Component Value Date   TSH 4.00 07/29/2015   Lab Results  Component Value Date   WBC 9.5 12/20/2015   HGB 15.1 12/20/2015   HCT 46.6 12/20/2015  MCV 92.7 12/20/2015   PLT 232.0 12/20/2015   Lab Results  Component Value Date   NA 139 12/10/2015   K 4.6 12/10/2015   CO2 26 12/10/2015   GLUCOSE 91 12/10/2015   BUN 20 12/10/2015   CREATININE 1.25* 12/10/2015   BILITOT 0.6 12/10/2015   ALKPHOS 47 12/10/2015   AST 20 12/10/2015   ALT 14 12/10/2015   PROT 7.5 12/10/2015   ALBUMIN 4.2 12/10/2015   CALCIUM 9.7 12/10/2015   ANIONGAP 15 08/22/2014   GFR 56.72* 07/29/2015   Lab Results  Component Value Date   CHOL 143 07/29/2015   Lab Results  Component Value Date   HDL 37.70* 07/29/2015   Lab Results  Component Value Date   LDLCALC 78 07/29/2015   Lab Results  Component Value Date   TRIG 136.0 07/29/2015   Lab Results  Component Value Date   CHOLHDL 4 07/29/2015   Lab Results  Component Value Date   HGBA1C 6.2 07/29/2015       Assessment & Plan:   Problem List Items Addressed This Visit    BPH (benign prostatic hyperplasia)    C/o urinary frequency but declines meds.at this time      Carotid artery disease ([place])    Is in need of follow up ultrasound this is ordered today then follow up with his cardiologist is arranged. He is asymptomatic      Relevant Medications   losartan (COZAAR) 100 MG tablet   Essential hypertension    Well controlled, no changes to meds. Encouraged heart healthy diet such as the DASH diet and exercise as tolerated.       Relevant Medications   losartan (COZAAR) 100 MG tablet   Other Relevant Orders   TSH   CBC   Comprehensive metabolic panel   Gastroesophageal reflux disease without esophagitis    Avoid offending foods, start probiotics. Do not eat large meals in late  evening and consider raising head of bed.       Hyperglycemia    hgba1c acceptable, minimize simple carbs. Increase exercise as tolerated.       Relevant Orders   Hemoglobin A1c   Low back pain syndrome    Doing well with tylenol 1000 mg bid and heat prn. Encouraged to stay active and take walks twice daily, and increase activity throughout the day, offered PT for some sense of weakness in b/l legs when ambulating.       Relevant Medications   acetaminophen (TYLENOL) 500 MG tablet   Medicare annual wellness visit, subsequent    Patient denies any difficulties at home. No trouble with ADLs, depression or falls. See EMR for functional status screen and depression screen. No recent changes to vision or hearing. Is UTD with immunizations. Is UTD with screening. Discussed Advanced Directives. Encouraged heart healthy diet, exercise as tolerated and adequate sleep. See patient's problem list for health risk factors to monitor. See AVS for preventative healthcare recommendation schedule.  Preferred Pharmacy and which med where: [facility] Delivery except eye drops Local pharmacy: [facility] [address]  Allergies verified:Yes  Immunization Status: Reviewed Prompted for insurance verification: Yes Flu vaccine--Last 07/2015 Tdap--Unsure of last per wife PNA--Last 08/2014 Shingles--Last 09/2010   Changes to [place], PSH or Personal Hx:Will update at front desk upon check in [place]: Last 08/2006 Normal [id number] 10/2013   Care Teams Updated: Yes ED/Hospital/Urgent Care Visits: None Updated Insurance, contact information, forms: Will update at front desk Remind to bring: DPR information, advance directives: Yes  Forms: None To Discuss with Provider: None       Other Visit Diagnoses    Need for TD vaccine    -  Primary    Hyperlipemia        Relevant Medications    losartan (COZAAR) 100 MG tablet    Preventative health care        Relevant Orders    Lipid panel       I have  discontinued Mr. Adachi's losartan. I am also having him start on acetaminophen and losartan. Additionally, I am having him maintain his aspirin EC, LUMIGAN, timolol, finasteride, metoprolol succinate, pantoprazole, traMADol, atorvastatin, and baclofen.  Meds ordered this encounter  Medications  . acetaminophen (TYLENOL) 500 MG tablet    Sig: Take 2 tablets (1,000 mg total) by mouth 2 (two) times daily.    Dispense:  30 tablet    Refill:  0  . losartan (COZAAR) 100 MG tablet    Sig: Take 1 tablet (100 mg total) by mouth daily.    Dispense:  90 tablet    Refill:  0     Penni Homans, MD

## 2016-02-01 NOTE — Assessment & Plan Note (Addendum)
Doing well with tylenol 1000 mg bid and heat prn. Encouraged to stay active and take walks twice daily, and increase activity throughout the day, offered PT for some sense of weakness in b/l legs when ambulating.

## 2016-02-01 NOTE — Assessment & Plan Note (Signed)
C/o urinary frequency but declines meds.at this time

## 2016-02-01 NOTE — Assessment & Plan Note (Signed)
Follows with Dr Bing Plume, o new changes

## 2016-02-01 NOTE — Assessment & Plan Note (Signed)
Well controlled, no changes to meds. Encouraged heart healthy diet such as the DASH diet and exercise as tolerated.  °

## 2016-02-01 NOTE — Assessment & Plan Note (Signed)
Patient denies any difficulties at home. No trouble with ADLs, depression or falls. See EMR for functional status screen and depression screen. No recent changes to vision or hearing. Is UTD with immunizations. Is UTD with screening. Discussed Advanced Directives. Encouraged heart healthy diet, exercise as tolerated and adequate sleep. See patient's problem list for health risk factors to monitor. See AVS for preventative healthcare recommendation schedule.  Preferred Pharmacy and which med where: [facility] Delivery except eye drops Local pharmacy: [facility] [address]  Allergies verified:Yes  Immunization Status: Reviewed Prompted for insurance verification: Yes Flu vaccine--Last 07/2015 Tdap--Unsure of last per wife PNA--Last 08/2014 Shingles--Last 09/2010   Changes to [place], PSH or Personal Hx:Will update at front desk upon check in [place]: Last 08/2006 Normal [id number] 10/2013   Care Teams Updated: Yes ED/Hospital/Urgent Care Visits: None Updated Insurance, contact information, forms: Will update at front desk Remind to bring: DPR information, advance directives: Yes Forms: None To Discuss with Provider: None

## 2016-02-01 NOTE — Assessment & Plan Note (Signed)
hgba1c acceptable, minimize simple carbs. Increase exercise as tolerated.  

## 2016-02-01 NOTE — Patient Instructions (Addendum)
[employer]  Now has a Lidocaine patch to try Can take an extra dose of tylenol as needed.  Preventive Care for Adults, Male A healthy lifestyle and preventive care can promote health and wellness. Preventive health guidelines for men include the following key practices:  A routine yearly physical is a good way to check with your health care provider about your health and preventative screening. It is a chance to share any concerns and updates on your health and to receive a thorough exam.  Visit your dentist for a routine exam and preventative care every 6 months. Brush your teeth twice a day and floss once a day. Good oral hygiene prevents tooth decay and gum disease.  The frequency of eye exams is based on your age, health, family medical history, use of contact lenses, and other factors. Follow your health care provider's recommendations for frequency of eye exams.  Eat a healthy diet. Foods such as vegetables, fruits, whole grains, low-fat dairy products, and lean protein foods contain the nutrients you need without too many calories. Decrease your intake of foods high in solid fats, added sugars, and salt. Eat the right amount of calories for you.Get information about a proper diet from your health care provider, if necessary.  Regular physical exercise is one of the most important things you can do for your health. Most adults should get at least 150 minutes of moderate-intensity exercise (any activity that increases your heart rate and causes you to sweat) each week. In addition, most adults need muscle-strengthening exercises on 2 or more days a week.  Maintain a healthy weight. The body mass index (BMI) is a screening tool to identify possible weight problems. It provides an estimate of body fat based on height and weight. Your health care provider can find your BMI and can help you achieve or maintain a healthy weight.For adults 20 years and older:  A BMI below 18.5 is  considered underweight.  A BMI of 18.5 to 24.9 is normal.  A BMI of 25 to 29.9 is considered overweight.  A BMI of 30 and above is considered obese.  Maintain normal blood lipids and cholesterol levels by exercising and minimizing your intake of saturated fat. Eat a balanced diet with plenty of fruit and vegetables. Blood tests for lipids and cholesterol should begin at age 31 and be repeated every 5 years. If your lipid or cholesterol levels are high, you are over 50, or you are at high risk for heart disease, you may need your cholesterol levels checked more frequently.Ongoing high lipid and cholesterol levels should be treated with medicines if diet and exercise are not working.  If you smoke, find out from your health care provider how to quit. If you do not use tobacco, do not start.  Lung cancer screening is recommended for adults aged 21-80 years who are at high risk for developing lung cancer because of a history of smoking. A yearly low-dose CT scan of the lungs is recommended for people who have at least a 30-pack-year history of smoking and are a current smoker or have quit within the past 15 years. A pack year of smoking is smoking an average of 1 pack of cigarettes a day for 1 year (for example: 1 pack a day for 30 years or 2 packs a day for 15 years). Yearly screening should continue until the smoker has stopped smoking for at least 15 years. Yearly screening should be stopped for people who develop a  health problem that would prevent them from having lung cancer treatment.  If you choose to drink alcohol, do not have more than 2 drinks per day. One drink is considered to be 12 ounces (355 mL) of beer, 5 ounces (148 mL) of wine, or 1.5 ounces (44 mL) of liquor.  Avoid use of street drugs. Do not share needles with anyone. Ask for help if you need support or instructions about stopping the use of drugs.  High blood pressure causes heart disease and increases the risk of stroke. Your  blood pressure should be checked at least every 1-2 years. Ongoing high blood pressure should be treated with medicines, if weight loss and exercise are not effective.  If you are 26-23 years old, ask your health care provider if you should take aspirin to prevent heart disease.  Diabetes screening is done by taking a blood sample to check your blood glucose level after you have not eaten for a certain period of time (fasting). If you are not overweight and you do not have risk factors for diabetes, you should be screened once every 3 years starting at age 37. If you are overweight or obese and you are 16-26 years of age, you should be screened for diabetes every year as part of your cardiovascular risk assessment.  Colorectal cancer can be detected and often prevented. Most routine colorectal cancer screening begins at the age of 30 and continues through age 29. However, your health care provider may recommend screening at an earlier age if you have risk factors for colon cancer. On a yearly basis, your health care provider may provide home test kits to check for hidden blood in the stool. Use of a small camera at the end of a tube to directly examine the colon (sigmoidoscopy or colonoscopy) can detect the earliest forms of colorectal cancer. Talk to your health care provider about this at age 93, when routine screening begins. Direct exam of the colon should be repeated every 5-10 years through age 36, unless early forms of precancerous polyps or small growths are found.  People who are at an increased risk for hepatitis B should be screened for this virus. You are considered at high risk for hepatitis B if:  You were born in a country where hepatitis B occurs often. Talk with your health care provider about which countries are considered high risk.  Your parents were born in a high-risk country and you have not received a shot to protect against hepatitis B (hepatitis B vaccine).  You have HIV or  AIDS.  You use needles to inject street drugs.  You live with, or have sex with, someone who has hepatitis B.  You are a man who has sex with other men (MSM).  You get hemodialysis treatment.  You take certain medicines for conditions such as cancer, organ transplantation, and autoimmune conditions.  Hepatitis C blood testing is recommended for all people born from [birth year] through 1965 and any individual with known risks for hepatitis C.  Practice safe sex. Use condoms and avoid high-risk sexual practices to reduce the spread of sexually transmitted infections (STIs). STIs include gonorrhea, chlamydia, syphilis, trichomonas, herpes, HPV, and human immunodeficiency virus (HIV). Herpes, HIV, and HPV are viral illnesses that have no cure. They can result in disability, cancer, and death.  If you are a man who has sex with other men, you should be screened at least once per year for:  HIV.  Urethral, rectal, and pharyngeal infection of  gonorrhea, chlamydia, or both.  If you are at risk of being infected with HIV, it is recommended that you take a prescription medicine daily to prevent HIV infection. This is called preexposure prophylaxis (PrEP). You are considered at risk if:  You are a man who has sex with other men (MSM) and have other risk factors.  You are a heterosexual man, are sexually active, and are at increased risk for HIV infection.  You take drugs by injection.  You are sexually active with a partner who has HIV.  Talk with your health care provider about whether you are at high risk of being infected with HIV. If you choose to begin PrEP, you should first be tested for HIV. You should then be tested every 3 months for as long as you are taking PrEP.  A one-time screening for abdominal aortic aneurysm (AAA) and surgical repair of large AAAs by ultrasound are recommended for men ages 40 to 72 years who are current or former smokers.  Healthy men should no longer receive  prostate-specific antigen (PSA) blood tests as part of routine cancer screening. Talk with your health care provider about prostate cancer screening.  Testicular cancer screening is not recommended for adult males who have no symptoms. Screening includes self-exam, a health care provider exam, and other screening tests. Consult with your health care provider about any symptoms you have or any concerns you have about testicular cancer.  Use sunscreen. Apply sunscreen liberally and repeatedly throughout the day. You should seek shade when your shadow is shorter than you. Protect yourself by wearing long sleeves, pants, a wide-brimmed hat, and sunglasses year round, whenever you are outdoors.  Once a month, do a whole-body skin exam, using a mirror to look at the skin on your back. Tell your health care provider about new moles, moles that have irregular borders, moles that are larger than a pencil eraser, or moles that have changed in shape or color.  Stay current with required vaccines (immunizations).  Influenza vaccine. All adults should be immunized every year.  Tetanus, diphtheria, and acellular pertussis (Td, Tdap) vaccine. An adult who has not previously received Tdap or who does not know his vaccine status should receive 1 dose of Tdap. This initial dose should be followed by tetanus and diphtheria toxoids (Td) booster doses every 10 years. Adults with an unknown or incomplete history of completing a 3-dose immunization series with Td-containing vaccines should begin or complete a primary immunization series including a Tdap dose. Adults should receive a Td booster every 10 years.  Varicella vaccine. An adult without evidence of immunity to varicella should receive 2 doses or a second dose if he has previously received 1 dose.  Human papillomavirus (HPV) vaccine. Males aged 11-21 years who have not received the vaccine previously should receive the 3-dose series. Males aged 22-26 years may be  immunized. Immunization is recommended through the age of 16 years for any male who has sex with males and did not get any or all doses earlier. Immunization is recommended for any person with an immunocompromised condition through the age of 58 years if he did not get any or all doses earlier. During the 3-dose series, the second dose should be obtained 4-8 weeks after the first dose. The third dose should be obtained 24 weeks after the first dose and 16 weeks after the second dose.  Zoster vaccine. One dose is recommended for adults aged 21 years or older unless certain conditions are present.  Measles, mumps, and rubella (MMR) vaccine. Adults born before [birth year] generally are considered immune to measles and mumps. Adults born in [birth year] or later should have 1 or more doses of MMR vaccine unless there is a contraindication to the vaccine or there is laboratory evidence of immunity to each of the three diseases. A routine second dose of MMR vaccine should be obtained at least 28 days after the first dose for students attending postsecondary schools, health care workers, or international travelers. People who received inactivated measles vaccine or an unknown type of measles vaccine during 1963-1967 should receive 2 doses of MMR vaccine. People who received inactivated mumps vaccine or an unknown type of mumps vaccine before 1979 and are at high risk for mumps infection should consider immunization with 2 doses of MMR vaccine. Unvaccinated health care workers born before [birth year] who lack laboratory evidence of measles, mumps, or rubella immunity or laboratory confirmation of disease should consider measles and mumps immunization with 2 doses of MMR vaccine or rubella immunization with 1 dose of MMR vaccine.  Pneumococcal 13-valent conjugate (PCV13) vaccine. When indicated, a person who is uncertain of his immunization history and has no record of immunization should receive the PCV13 vaccine. All adults 87 years of  age and older should receive this vaccine. An adult aged 4 years or older who has certain medical conditions and has not been previously immunized should receive 1 dose of PCV13 vaccine. This PCV13 should be followed with a dose of pneumococcal polysaccharide (PPSV23) vaccine. Adults who are at high risk for pneumococcal disease should obtain the PPSV23 vaccine at least 8 weeks after the dose of PCV13 vaccine. Adults older than 80 years of age who have normal immune system function should obtain the PPSV23 vaccine dose at least 1 year after the dose of PCV13 vaccine.  Pneumococcal polysaccharide (PPSV23) vaccine. When PCV13 is also indicated, PCV13 should be obtained first. All adults aged 50 years and older should be immunized. An adult younger than age 24 years who has certain medical conditions should be immunized. Any person who resides in a nursing home or long-term care facility should be immunized. An adult smoker should be immunized. People with an immunocompromised condition and certain other conditions should receive both PCV13 and PPSV23 vaccines. People with human immunodeficiency virus (HIV) infection should be immunized as soon as possible after diagnosis. Immunization during chemotherapy or radiation therapy should be avoided. Routine use of PPSV23 vaccine is not recommended for American Indians, [place] Natives, or people younger than 65 years unless there are medical conditions that require PPSV23 vaccine. When indicated, people who have unknown immunization and have no record of immunization should receive PPSV23 vaccine. One-time revaccination 5 years after the first dose of PPSV23 is recommended for people aged 19-64 years who have chronic kidney failure, nephrotic syndrome, asplenia, or immunocompromised conditions. People who received 1-2 doses of PPSV23 before age 60 years should receive another dose of PPSV23 vaccine at age 77 years or later if at least 5 years have passed since the  previous dose. Doses of PPSV23 are not needed for people immunized with PPSV23 at or after age 51 years.  Meningococcal vaccine. Adults with asplenia or persistent complement component deficiencies should receive 2 doses of quadrivalent meningococcal conjugate (MenACWY-D) vaccine. The doses should be obtained at least 2 months apart. Microbiologists working with certain meningococcal bacteria, [place] recruits, people at risk during an outbreak, and people who travel to or live in countries with a high rate of meningitis  should be immunized. A first-year college student up through age 52 years who is living in a residence hall should receive a dose if he did not receive a dose on or after his 16th birthday. Adults who have certain high-risk conditions should receive one or more doses of vaccine.  Hepatitis A vaccine. Adults who wish to be protected from this disease, have chronic liver disease, work with hepatitis A-infected animals, work in hepatitis A research labs, or travel to or work in countries with a high rate of hepatitis A should be immunized. Adults who were previously unvaccinated and who anticipate close contact with an international adoptee during the first 60 days after arrival in the [place] from a country with a high rate of hepatitis A should be immunized.  Hepatitis B vaccine. Adults should be immunized if they wish to be protected from this disease, are under age 70 years and have diabetes, have chronic liver disease, have had more than one sex partner in the past 6 months, may be exposed to blood or other infectious body fluids, are household contacts or sex partners of hepatitis B positive people, are clients or workers in certain care facilities, or travel to or work in countries with a high rate of hepatitis B.  Haemophilus influenzae type b (Hib) vaccine. A previously unvaccinated person with asplenia or sickle cell disease or having a scheduled splenectomy should receive 1  dose of Hib vaccine. Regardless of previous immunization, a recipient of a hematopoietic stem cell transplant should receive a 3-dose series 6-12 months after his successful transplant. Hib vaccine is not recommended for adults with HIV infection. Preventive Service / Frequency Ages 13 to 70  Blood pressure check.** / Every 3-5 years.  Lipid and cholesterol check.** / Every 5 years beginning at age 25.  Hepatitis C blood test.** / For any individual with known risks for hepatitis C.  Skin self-exam. / Monthly.  Influenza vaccine. / Every year.  Tetanus, diphtheria, and acellular pertussis (Tdap, Td) vaccine.** / Consult your health care provider. 1 dose of Td every 10 years.  Varicella vaccine.** / Consult your health care provider.  HPV vaccine. / 3 doses over 6 months, if 34 or younger.  Measles, mumps, rubella (MMR) vaccine.** / You need at least 1 dose of MMR if you were born in 1957 or later. You may also need a second dose.  Pneumococcal 13-valent conjugate (PCV13) vaccine.** / Consult your health care provider.  Pneumococcal polysaccharide (PPSV23) vaccine.** / 1 to 2 doses if you smoke cigarettes or if you have certain conditions.  Meningococcal vaccine.** / 1 dose if you are age 70 to 13 years and a Market researcher living in a residence hall, or have one of several medical conditions. You may also need additional booster doses.  Hepatitis A vaccine.** / Consult your health care provider.  Hepatitis B vaccine.** / Consult your health care provider.  Haemophilus influenzae type b (Hib) vaccine.** / Consult your health care provider. Ages 36 to 71  Blood pressure check.** / Every year.  Lipid and cholesterol check.** / Every 5 years beginning at age 41.  Lung cancer screening. / Every year if you are aged 88-80 years and have a 30-pack-year history of smoking and currently smoke or have quit within the past 15 years. Yearly screening is stopped once you have  quit smoking for at least 15 years or develop a health problem that would prevent you from having lung cancer treatment.  Fecal occult blood  test (FOBT) of stool. / Every year beginning at age 80 and continuing until age 72. You may not have to do this test if you get a colonoscopy every 10 years.  Flexible sigmoidoscopy** or colonoscopy.** / Every 5 years for a flexible sigmoidoscopy or every 10 years for a colonoscopy beginning at age 19 and continuing until age 42.  Hepatitis C blood test.** / For all people born from [birth year] through 1965 and any individual with known risks for hepatitis C.  Skin self-exam. / Monthly.  Influenza vaccine. / Every year.  Tetanus, diphtheria, and acellular pertussis (Tdap/Td) vaccine.** / Consult your health care provider. 1 dose of Td every 10 years.  Varicella vaccine.** / Consult your health care provider.  Zoster vaccine.** / 1 dose for adults aged 29 years or older.  Measles, mumps, rubella (MMR) vaccine.** / You need at least 1 dose of MMR if you were born in 1955 or later. You may also need a second dose.  Pneumococcal 13-valent conjugate (PCV13) vaccine.** / Consult your health care provider.  Pneumococcal polysaccharide (PPSV23) vaccine.** / 1 to 2 doses if you smoke cigarettes or if you have certain conditions.  Meningococcal vaccine.** / Consult your health care provider.  Hepatitis A vaccine.** / Consult your health care provider.  Hepatitis B vaccine.** / Consult your health care provider.  Haemophilus influenzae type b (Hib) vaccine.** / Consult your health care provider. Ages 38 and over  Blood pressure check.** / Every year.  Lipid and cholesterol check.**/ Every 5 years beginning at age 52.  Lung cancer screening. / Every year if you are aged 59-80 years and have a 30-pack-year history of smoking and currently smoke or have quit within the past 15 years. Yearly screening is stopped once you have quit smoking for at least 15 years or  develop a health problem that would prevent you from having lung cancer treatment.  Fecal occult blood test (FOBT) of stool. / Every year beginning at age 8 and continuing until age 49. You may not have to do this test if you get a colonoscopy every 10 years.  Flexible sigmoidoscopy** or colonoscopy.** / Every 5 years for a flexible sigmoidoscopy or every 10 years for a colonoscopy beginning at age 26 and continuing until age 75.  Hepatitis C blood test.** / For all people born from [birth year] through 1963 and any individual with known risks for hepatitis C.  Abdominal aortic aneurysm (AAA) screening.** / A one-time screening for ages 57 to 65 years who are current or former smokers.  Skin self-exam. / Monthly.  Influenza vaccine. / Every year.  Tetanus, diphtheria, and acellular pertussis (Tdap/Td) vaccine.** / 1 dose of Td every 10 years.  Varicella vaccine.** / Consult your health care provider.  Zoster vaccine.** / 1 dose for adults aged 65 years or older.  Pneumococcal 13-valent conjugate (PCV13) vaccine.** / 1 dose for all adults aged 102 years and older.  Pneumococcal polysaccharide (PPSV23) vaccine.** / 1 dose for all adults aged 77 years and older.  Meningococcal vaccine.** / Consult your health care provider.  Hepatitis A vaccine.** / Consult your health care provider.  Hepatitis B vaccine.** / Consult your health care provider.  Haemophilus influenzae type b (Hib) vaccine.** / Consult your health care provider. **Family history and personal history of risk and conditions may change your health care provider's recommendations.   This information is not intended to replace advice given to you by your health care provider. Make sure you discuss any questions you  have with your health care provider.   Document Released: 12/19/2001 Document Revised: 11/13/2014 Document Reviewed: 03/20/2011 Elsevier Interactive Patient Education 2016 [employer]. lenol on a bad day

## 2016-02-01 NOTE — Assessment & Plan Note (Signed)
Is in need of follow up ultrasound this is ordered today then follow up with his cardiologist is arranged. He is asymptomatic

## 2016-02-15 NOTE — Telephone Encounter (Signed)
Pre visit call completed 

## 2016-02-18 ENCOUNTER — Other Ambulatory Visit: Payer: Self-pay | Admitting: Family Medicine

## 2016-03-27 ENCOUNTER — Emergency Department (HOSPITAL_BASED_OUTPATIENT_CLINIC_OR_DEPARTMENT_OTHER)
Admission: EM | Admit: 2016-03-27 | Discharge: 2016-03-27 | Disposition: A | Discharge status: Home / Self Care | Payer: Commercial Managed Care - HMO | Attending: Emergency Medicine | Admitting: Emergency Medicine

## 2016-03-27 ENCOUNTER — Encounter (HOSPITAL_BASED_OUTPATIENT_CLINIC_OR_DEPARTMENT_OTHER): Payer: Self-pay | Admitting: *Deleted

## 2016-03-27 DIAGNOSIS — N182 Chronic kidney disease, stage 2 (mild): Secondary | ICD-10-CM | POA: Diagnosis not present

## 2016-03-27 DIAGNOSIS — I159 Secondary hypertension, unspecified: Secondary | ICD-10-CM

## 2016-03-27 DIAGNOSIS — E785 Hyperlipidemia, unspecified: Secondary | ICD-10-CM | POA: Insufficient documentation

## 2016-03-27 DIAGNOSIS — Z79899 Other long term (current) drug therapy: Secondary | ICD-10-CM | POA: Diagnosis not present

## 2016-03-27 DIAGNOSIS — Z7982 Long term (current) use of aspirin: Secondary | ICD-10-CM | POA: Insufficient documentation

## 2016-03-27 DIAGNOSIS — I2510 Atherosclerotic heart disease of native coronary artery without angina pectoris: Secondary | ICD-10-CM | POA: Insufficient documentation

## 2016-03-27 DIAGNOSIS — Z87891 Personal history of nicotine dependence: Secondary | ICD-10-CM | POA: Insufficient documentation

## 2016-03-27 DIAGNOSIS — I129 Hypertensive chronic kidney disease with stage 1 through stage 4 chronic kidney disease, or unspecified chronic kidney disease: Secondary | ICD-10-CM | POA: Diagnosis not present

## 2016-03-27 LAB — CBC
HCT: 43.3 % (ref 39.0–52.0)
Hemoglobin: 14.7 g/dL (ref 13.0–17.0)
MCH: 31.3 pg (ref 26.0–34.0)
MCHC: 33.9 g/dL (ref 30.0–36.0)
MCV: 92.3 fL (ref 78.0–100.0)
PLATELETS: 199 10*3/uL (ref 150–400)
RBC: 4.69 MIL/uL (ref 4.22–5.81)
RDW: 13.8 % (ref 11.5–15.5)
WBC: 8.4 10*3/uL (ref 4.0–10.5)

## 2016-03-27 LAB — URINALYSIS, ROUTINE W REFLEX MICROSCOPIC
Bilirubin Urine: NEGATIVE
GLUCOSE, UA: NEGATIVE mg/dL
HGB URINE DIPSTICK: NEGATIVE
KETONES UR: NEGATIVE mg/dL
Leukocytes, UA: NEGATIVE
Nitrite: NEGATIVE
PH: 7.5 (ref 5.0–8.0)
Protein, ur: NEGATIVE mg/dL
Specific Gravity, Urine: 1.005 (ref 1.005–1.030)

## 2016-03-27 LAB — BASIC METABOLIC PANEL
Anion gap: 9 (ref 5–15)
BUN: 20 mg/dL (ref 6–20)
CALCIUM: 9 mg/dL (ref 8.9–10.3)
CHLORIDE: 105 mmol/L (ref 101–111)
CO2: 23 mmol/L (ref 22–32)
CREATININE: 1.14 mg/dL (ref 0.61–1.24)
GFR calc Af Amer: >60 mL/min (ref >60)
GFR calc non Af Amer: 56 mL/min — ABNORMAL LOW (ref >60)
Glucose, Bld: 96 mg/dL (ref 65–99)
Potassium: 4.2 mmol/L (ref 3.5–5.1)
SODIUM: 137 mmol/L (ref 135–145)

## 2016-03-27 LAB — TROPONIN I

## 2016-03-27 MED ORDER — CLONIDINE HCL 0.1 MG PO TABS
0.1000 mg | ORAL_TABLET | Freq: Once | ORAL | Status: AC
  Administered 2016-03-27: 0.1 mg via ORAL
  Filled 2016-03-27: qty 1

## 2016-03-27 MED ORDER — METOPROLOL TARTRATE 50 MG PO TABS
ORAL_TABLET | ORAL | Status: AC
  Filled 2016-03-27: qty 1

## 2016-03-27 MED ORDER — METOPROLOL SUCCINATE ER 25 MG PO TB24
25.0000 mg | ORAL_TABLET | Freq: Every day | ORAL | Status: DC
  Filled 2016-03-27: qty 1

## 2016-03-27 NOTE — Discharge Instructions (Signed)
You were seen and evaluated tonight for your hypertension. This needs to be better controlled long-term. You need to make a follow-up appointment with your primary care physician. Call them first thing tomorrow to arrange for medication adjustments and for reevaluation. You should continue all of your medications as prescribed outpatient. Return for sudden onset of chest pain, shortness breath, headache, neurologic changes.  Hypertension Hypertension is another name for high blood pressure. High blood pressure forces your heart to work harder to pump blood. A blood pressure reading has two numbers, which includes a higher number over a lower number (example: 110/72). HOME CARE   Have your blood pressure rechecked by your doctor.  Only take medicine as told by your doctor. Follow the directions carefully. The medicine does not work as well if you skip doses. Skipping doses also puts you at risk for problems.  Do not smoke.  Monitor your blood pressure at home as told by your doctor. GET HELP IF:  You think you are having a reaction to the medicine you are taking.  You have repeat headaches or feel dizzy.  You have puffiness (swelling) in your ankles.  You have trouble with your vision. GET HELP RIGHT AWAY IF:   You get a very bad headache and are confused.  You feel weak, numb, or faint.  You get chest or belly (abdominal) pain.  You throw up (vomit).  You cannot breathe very well. MAKE SURE YOU:   Understand these instructions.  Will watch your condition.  Will get help right away if you are not doing well or get worse.   This information is not intended to replace advice given to you by your health care provider. Make sure you discuss any questions you have with your health care provider.   Document Released: 04/10/2008 Document Revised: 10/28/2013 Document Reviewed: 08/15/2013 Elsevier Interactive Patient Education [employer].

## 2016-03-27 NOTE — ED Notes (Signed)
Patient reports that he took his BP at home and it was elevated - Patient reports that he has no symptoms but was worried about the elevated BP

## 2016-03-27 NOTE — ED Provider Notes (Signed)
CSN: [id number]     Arrival date & time 03/27/16  1552 History   By signing my name below, I, Nicole Kindred, attest that this documentation has been prepared under the direction and in the presence of No att. providers found.   Electronically Signed: Nicole Kindred, ED Scribe. 03/28/2016. 8:44 AM   Chief Complaint  Patient presents with  . Hypertension   The history is provided by the patient. No language interpreter was used.   HPI Comments: Troy Ray is a 82 y.o. male with extensive PMHx including HTN and CAD who presents to the Emergency Department complaining of gradual onset, hypertension, beginning this morning. Pt's wife had an MI last week and was checking her BP at home today. Pt checked his and it was 200s/100s. In the ED his BP measured 239/88. Pt does not have any specific symptoms and states he "feels great" in ED. No other associated symptoms noted. No worsening or alleviating factors noted. Pt denies hx of uncontrolled hypertension, difficulty urinating, headache, visual disturbance, abdominal pain, nausea, vomiting, chest pain, shortness of breath, or any other pertinent symptoms. Pt is currently on losartan and toprol-xl for his BP.   Past Medical History  Diagnosis Date  . CAD (coronary artery disease)   . Hypertension   . Chronic kidney disease     chronic, stage II  . Hyperlipidemia   . PVD (peripheral vascular disease) ([place])   . Postural lightheadedness   . Memory loss   . Dysphagia   . Esophageal stricture     Dr [name]  . CRI (chronic renal insufficiency)   . Sensorineural hearing loss, bilateral   . Carotid stenosis, bilateral     right 60-79% stenosed.  Left 40-59%  . Adenomatous colon polyp   . Diverticulosis   . Unspecified constipation 12/29/2012  . BPH (benign prostatic hyperplasia) 07/14/2013  . Headache(784.0) 12/01/2013  . Glaucoma   . Medicare annual wellness visit, subsequent 02/04/2015  . Insomnia 05/12/2015  . Renal insufficiency   . Low  back pain syndrome 04/03/2014   Past Surgical History  Procedure Laterality Date  . Hernia repair  03/23/05    left inguinal Lichtenstein repair, with mesh.  [name]  . Abdominal aortic aneurysm repair  1996    Dr [name]  . Renal artery stent  1996    Dr [name].  Bilateral stenting  . Coronary artery bypass graft    . Eye surgery      cataracts b/l and laser   Family History  Problem Relation Age of Onset  . Heart disease Father     CAD  . Coronary artery disease Father   . Stroke Father   . Cancer Neg Hx   . Diabetes Neg Hx   . Heart disease Son   . Heart disease Brother     MI   Social History  Substance Use Topics  . Smoking status: Former Research scientist (life sciences)  . Smokeless tobacco: Never Used     Comment: quit 1980  . Alcohol Use: 0.6 oz/week    1 Glasses of wine per week    Review of Systems  Constitutional: Negative for fever and chills.  Eyes: Negative for visual disturbance.  Respiratory: Negative for shortness of breath.   Cardiovascular: Negative for chest pain.  Gastrointestinal: Negative for nausea, vomiting and abdominal pain.  All other systems reviewed and are negative.   Allergies  Review of patient's allergies indicates no known allergies.  Home Medications   Prior to  Admission medications   Medication Sig Start Date End Date Taking? Authorizing Provider  acetaminophen (TYLENOL) 500 MG tablet Take 2 tablets (1,000 mg total) by mouth 2 (two) times daily. 02/01/16   [name], MD  aspirin EC 81 MG EC tablet Take 81 mg by mouth at bedtime.  02/21/11   [name] Shawna Orleans, DO  atorvastatin (LIPITOR) 40 MG tablet Take 1 tablet (40 mg total) by mouth daily. 11/19/15   [name], MD  baclofen (LIORESAL) 10 MG tablet Take 1 tablet (10 mg total) by mouth 2 (two) times daily. 12/10/15   [name], MD  finasteride (PROSCAR) 5 MG tablet TAKE 1 TABLET EVERY DAY 02/20/16   [name], MD  losartan (COZAAR) 100 MG tablet Take 1 tablet (100 mg total) by mouth  daily. 02/01/16   [name], MD  losartan (COZAAR) 50 MG tablet TAKE 1 TABLET TWICE DAILY 02/20/16   [name], MD  LUMIGAN 0.01 % SOLN  12/30/14   Historical Provider, MD  metoprolol succinate (TOPROL-XL) 25 MG 24 hr tablet TAKE 1 TABLET EVERY DAY WITH SUPPER 02/20/16   [name], MD  pantoprazole (PROTONIX) 40 MG tablet TAKE 1 TABLET EVERY DAY 02/20/16   [name], MD  timolol (TIMOPTIC) 0.5 % ophthalmic solution Place 1 drop into both eyes 1 day or 1 dose. 02/25/15   Historical Provider, MD  traMADol (ULTRAM) 50 MG tablet Take 1 tablet (50 mg total) by mouth every 12 (twelve) hours as needed. 11/10/15   Colon Branch, MD   BP 234/82 mmHg  Pulse 62  Temp(Src) 98.5 F (36.9 C) (Oral)  Resp 18  Ht 5\' 10"  (1.778 m)  Wt 173 lb (78.472 kg)  BMI 24.82 kg/m2  SpO2 98% Physical Exam  Constitutional: He is oriented to person, place, and time. He appears well-developed and well-nourished. No distress.  HENT:  Head: Normocephalic and atraumatic.  Right Ear: External ear normal.  Left Ear: External ear normal.  Mouth/Throat: Oropharynx is clear and moist. No oropharyngeal exudate.  Eyes: Conjunctivae and EOM are normal. Pupils are equal, round, and reactive to light.  Neck: Normal range of motion. Neck supple. No tracheal deviation present.  Cardiovascular: Normal rate, regular rhythm, normal heart sounds and intact distal pulses.   Pulmonary/Chest: Effort normal and breath sounds normal. No respiratory distress. He has no wheezes. He has no rales.  Abdominal: Soft. He exhibits no distension. There is no tenderness.  Musculoskeletal: Normal range of motion. He exhibits no edema.  Neurological: He is alert and oriented to person, place, and time. No sensory deficit. He exhibits normal muscle tone.  Hard of hearing  Skin: Skin is warm and dry. No rash noted. He is not diaphoretic.  Psychiatric: He has a normal mood and affect. His behavior is normal.  Vitals reviewed.   ED Course   Procedures (including critical care time) DIAGNOSTIC STUDIES: Oxygen Saturation is 98% on RA, normal by my interpretation.    COORDINATION OF CARE: 6:41 PM Discussed treatment plan with pt at bedside and pt agreed to plan.  Labs Review Labs Reviewed  BASIC METABOLIC PANEL - Abnormal; Notable for the following:    GFR calc non Af Amer 56 (*)    All other components within normal limits  CBC  TROPONIN I  URINALYSIS, ROUTINE W REFLEX MICROSCOPIC (NOT AT [facility])    Imaging Review No results found. I have personally reviewed and evaluated these images and lab results  as part of my medical decision-making.   EKG Interpretation   Date/Time:  Monday Mar 27 2016 16:13:43 EDT Ventricular Rate:  63 PR Interval:  178 QRS Duration: 76 QT Interval:  432 QTC Calculation: 442 R Axis:   7 Text Interpretation:  Normal sinus rhythm Normal ECG No significant change  since last tracing Confirmed by Anurag Scarfo ([id number]) on 03/27/2016 5:40:18  PM      MDM  Patient well appearing.  Asymptomatic HTN.  Blood work unremarkable.  We do not have Toprol XL in the ED to give the patient.  Given dose of Catapres as BP so severely elevated.  Patient and family report patient will be able to have close follow up with PCP for BP medication adjustments.  Given strict return precautions.  Patient to take home Medications as prescribed and to return for new symptoms.  Patient discharged home in stable condition. Final diagnoses:  Secondary hypertension, unspecified    I personally performed the services described in this documentation, which was scribed in my presence. The recorded information has been reviewed and is accurate.   Harvel Quale, MD 03/28/16 [phone]

## 2016-03-27 NOTE — ED Notes (Signed)
Socks provided per pt request.

## 2016-03-27 NOTE — ED Notes (Signed)
His wife took his BP and it was high. States he feels good. No symptoms.

## 2016-03-29 ENCOUNTER — Ambulatory Visit (INDEPENDENT_AMBULATORY_CARE_PROVIDER_SITE_OTHER): Payer: Commercial Managed Care - HMO | Admitting: Family Medicine

## 2016-03-29 ENCOUNTER — Encounter: Payer: Self-pay | Admitting: Family Medicine

## 2016-03-29 VITALS — BP 201/68 | HR 61 | Temp 97.7°F | Ht 69.0 in | Wt 170.6 lb

## 2016-03-29 DIAGNOSIS — I10 Essential (primary) hypertension: Secondary | ICD-10-CM

## 2016-03-29 MED ORDER — AMLODIPINE BESYLATE 5 MG PO TABS: 5.0000 mg | ORAL_TABLET | Freq: Every day | ORAL | Status: DC

## 2016-03-29 NOTE — Progress Notes (Signed)
Pre visit review using our clinic review tool, if applicable. No additional management support is needed unless otherwise documented below in the visit note. 

## 2016-03-29 NOTE — Progress Notes (Signed)
[place] at [facility] [address] [place], [place] [address] [phone]  Date:  03/29/2016   Name:  Troy Ray   DOB:  1928/01/15   MRN:  [id number]  PCP:  Penni Homans, MD    Chief Complaint: Hospitalization Follow-up   History of Present Illness:  Troy Ray is a 82 y.o. very pleasant male patient who presents with the following:  Patient of my partner Dr. Charlett Blake who is here today following visit to ER on Monday with hypertension (today is Wednesday).  Found to have BP of 239/88; he had measured high BP at home and got concerned but did not have any symptoms.  He is currently taking losartan 50 BID and toprol xl 25.   They did increase his losartan dose in March from 50 - 100 per his report  His wife of 49 years recently had an MI- she is doing ok but this was stressful.  They wonder if stress may be why his BP went up  He had an EKG and albs at the ER, was treated with catapres and released to home with close PCP follow-up He has had controlled HTN for years.  Never had trouble with such high numbers before They are checking his BP at home; over the last 2 days ranging 168- 234/ 70s.   He continues to have no symptoms such as CP, SOB, or HA  He did have an MI and CABG about 17 years ago but has done well since then except for HTN Nuclear stress in 2015 was normal, EF of 53%  BP Readings from Last 3 Encounters:  03/29/16 201/68  03/27/16 194/80  02/01/16 140/78    Patient Active Problem List   Diagnosis Date Noted  . Anorexia 12/12/2015  . Skin lesion of left arm 05/12/2015  . Gastroesophageal reflux disease without esophagitis 05/12/2015  . Insomnia 05/12/2015  . Medicare annual wellness visit, subsequent 02/04/2015  . Glaucoma 01/25/2015  . Low back pain syndrome 04/03/2014  . Apnea 01/25/2014  . Headache(784.0) 12/01/2013  . Sinusitis 10/13/2013  . BPH (benign prostatic hyperplasia) 07/14/2013  .  Constipation 12/29/2012  . LACK OF COORDINATION 11/17/2010  . Hyperglycemia 11/17/2010  . PERSONAL HISTORY OF COLONIC POLYPS 09/21/2009  . Carotid artery disease ([place]) 05/11/2009  . POSTURAL LIGHTHEADEDNESS 04/06/2009  . MEMORY LOSS 04/06/2009  . Essential hypertension 12/07/2008  . Coronary atherosclerosis 12/07/2008  . History of AAA (abdominal aortic aneurysm) repair 12/04/2008  . CHRONIC KIDNEY DISEASE STAGE II ([place]) 12/04/2008  . ESOPHAGEAL STRICTURE 05/29/2008  . OTHER DYSPHAGIA 05/29/2008    Past Medical History  Diagnosis Date  . CAD (coronary artery disease)   . Hypertension   . Chronic kidney disease     chronic, stage II  . Hyperlipidemia   . PVD (peripheral vascular disease) ([place])   . Postural lightheadedness   . Memory loss   . Dysphagia   . Esophageal stricture     Dr [name]  . CRI (chronic renal insufficiency)   . Sensorineural hearing loss, bilateral   . Carotid stenosis, bilateral     right 60-79% stenosed.  Left 40-59%  . Adenomatous colon polyp   . Diverticulosis   . Unspecified constipation 12/29/2012  . BPH (benign prostatic hyperplasia) 07/14/2013  . Headache(784.0) 12/01/2013  . Glaucoma   . Medicare annual wellness visit, subsequent 02/04/2015  . Insomnia 05/12/2015  . Renal insufficiency   . Low back pain syndrome  04/03/2014    Past Surgical History  Procedure Laterality Date  . Hernia repair  03/23/05    left inguinal Lichtenstein repair, with mesh.  [name]  . Abdominal aortic aneurysm repair  1996    Dr [name]  . Renal artery stent  1996    Dr [name].  Bilateral stenting  . Coronary artery bypass graft    . Eye surgery      cataracts b/l and laser    Social History  Substance Use Topics  . Smoking status: Former Research scientist (life sciences)  . Smokeless tobacco: Never Used     Comment: quit 1980  . Alcohol Use: 0.6 oz/week    1 Glasses of wine per week    Family History  Problem Relation Age of Onset  . Heart disease Father     CAD  .  Coronary artery disease Father   . Stroke Father   . Cancer Neg Hx   . Diabetes Neg Hx   . Heart disease Son   . Heart disease Brother     MI    No Known Allergies  Medication list has been reviewed and updated.  Current Outpatient Prescriptions on File Prior to Visit  Medication Sig Dispense Refill  . acetaminophen (TYLENOL) 500 MG tablet Take 2 tablets (1,000 mg total) by mouth 2 (two) times daily. 30 tablet 0  . aspirin EC 81 MG EC tablet Take 81 mg by mouth at bedtime.     [name] Kitchen atorvastatin (LIPITOR) 40 MG tablet Take 1 tablet (40 mg total) by mouth daily. 90 tablet 1  . baclofen (LIORESAL) 10 MG tablet Take 1 tablet (10 mg total) by mouth 2 (two) times daily. 30 each 1  . finasteride (PROSCAR) 5 MG tablet TAKE 1 TABLET EVERY DAY 90 tablet 2  . losartan (COZAAR) 50 MG tablet TAKE 1 TABLET TWICE DAILY 180 tablet 2  . LUMIGAN 0.01 % SOLN     . metoprolol succinate (TOPROL-XL) 25 MG 24 hr tablet TAKE 1 TABLET EVERY DAY WITH SUPPER 90 tablet 2  . pantoprazole (PROTONIX) 40 MG tablet TAKE 1 TABLET EVERY DAY 90 tablet 2  . timolol (TIMOPTIC) 0.5 % ophthalmic solution Place 1 drop into both eyes 1 day or 1 dose.     No current facility-administered medications on file prior to visit.    Review of Systems:  As per HPI- otherwise negative.   Physical Examination: Filed Vitals:   03/29/16 1355  BP: 215/76  Pulse: 61  Temp: 97.7 F (36.5 C)   Filed Vitals:   03/29/16 1355  Height: 5\' 9"  (1.753 m)  Weight: 170 lb 9.6 oz (77.384 kg)   Body mass index is 25.18 kg/(m^2). Ideal Body Weight: Weight in (lb) to have BMI = 25: 168.9  GEN: WDWN, NAD, Non-toxic, A & O x 3, looks well, he is HOH. His wife helps with history HEENT: Atraumatic, Normocephalic. Neck supple. No masses, No LAD. Ears and Nose: No external deformity. CV: RRR, No M/G/R. No JVD. No thrill. No extra heart sounds. PULM: CTA B, no wheezes, crackles, rhonchi. No retractions. No resp. distress. No accessory muscle  use. EXTR: No c/c/e NEURO Normal gait.  PSYCH: Normally interactive. Conversant. Not depressed or anxious appearing.  Calm demeanor.    Assessment and Plan: Essential hypertension - Plan: amLODipine (NORVASC) 5 MG tablet You were seen today for uncontrolled high blood pressure  Continue taking your losartan and metoprolol We are going to add amlodipine to your regimen. You have  5mg  tablets- the max dose is 10 mg Start with a 1/2 tablet or 2.5 mg. Assuming you do not feel faint you can increase to 5mg  the next day.  Please continue to monitor your blood pressure readings and let me or Dr. Charlett Blake know how you are doing in one week.  We may need to increase your dose to 10 mg if your blood pressure is still high.     Signed Lamar Blinks, MD

## 2016-03-29 NOTE — Patient Instructions (Signed)
You were seen today for uncontrolled high blood pressure  Continue taking your losartan and metoprolol We are going to add amlodipine to your regimen. You have 5mg  tablets- the max dose is 10 mg Start with a 1/2 tablet or 2.5 mg. Assuming you do not feel faint you can increase to 5mg  the next day.  Please continue to monitor your blood pressure readings and let me or Dr. Charlett Blake know how you are doing in one week.  We may need to increase your dose to 10 mg if your blood pressure is still high.

## 2016-03-30 ENCOUNTER — Telehealth: Payer: Self-pay | Admitting: Family Medicine

## 2016-03-30 NOTE — Telephone Encounter (Signed)
Called - BP 170/70 today. He is feeling well, advised ok to increase amlodipine to 5 mg JC

## 2016-04-10 ENCOUNTER — Telehealth: Payer: Self-pay | Admitting: Family Medicine

## 2016-04-10 NOTE — Telephone Encounter (Signed)
Caller name: Grosch,Louella   Relationship to patient: Wife  Can be reached: [phone]   Reason for call: Wife called to inform Dr. Lorelei Pont that patient is still having elevated BP readings after she changed his BP medication. States she was told to call and report today. Last reading 160/75 this morning. They have been running this way all week-end. States Dr. Charlett Blake was not available so he saw Dr. Lorelei Pont. Plse adv

## 2016-04-10 NOTE — Telephone Encounter (Signed)
Called his wife- his SBP is still high although his DBP is ok.  He is taking 5mg  of norvasx as well as toprol xl and losartan.  Will have him go to 7.5 of norvasc.  She will let me know if this does not control his BP

## 2016-04-20 ENCOUNTER — Other Ambulatory Visit: Payer: Self-pay | Admitting: Family Medicine

## 2016-05-19 DIAGNOSIS — H401132 Primary open-angle glaucoma, bilateral, moderate stage: Secondary | ICD-10-CM | POA: Diagnosis not present

## 2016-06-02 ENCOUNTER — Ambulatory Visit: Payer: Commercial Managed Care - HMO | Admitting: Family Medicine

## 2016-06-29 ENCOUNTER — Encounter: Payer: Self-pay | Admitting: Family Medicine

## 2016-06-29 ENCOUNTER — Ambulatory Visit (INDEPENDENT_AMBULATORY_CARE_PROVIDER_SITE_OTHER): Payer: Commercial Managed Care - HMO | Admitting: Family Medicine

## 2016-06-29 VITALS — BP 126/76 | HR 76 | Temp 98.1°F | Resp 14 | Ht 69.0 in | Wt 169.4 lb

## 2016-06-29 DIAGNOSIS — E782 Mixed hyperlipidemia: Secondary | ICD-10-CM

## 2016-06-29 DIAGNOSIS — M5441 Lumbago with sciatica, right side: Secondary | ICD-10-CM

## 2016-06-29 DIAGNOSIS — Z23 Encounter for immunization: Secondary | ICD-10-CM | POA: Diagnosis not present

## 2016-06-29 DIAGNOSIS — N400 Enlarged prostate without lower urinary tract symptoms: Secondary | ICD-10-CM

## 2016-06-29 DIAGNOSIS — R739 Hyperglycemia, unspecified: Secondary | ICD-10-CM

## 2016-06-29 DIAGNOSIS — K5900 Constipation, unspecified: Secondary | ICD-10-CM

## 2016-06-29 DIAGNOSIS — K219 Gastro-esophageal reflux disease without esophagitis: Secondary | ICD-10-CM

## 2016-06-29 DIAGNOSIS — I10 Essential (primary) hypertension: Secondary | ICD-10-CM

## 2016-06-29 DIAGNOSIS — I779 Disorder of arteries and arterioles, unspecified: Secondary | ICD-10-CM

## 2016-06-29 DIAGNOSIS — I739 Peripheral vascular disease, unspecified: Secondary | ICD-10-CM

## 2016-06-29 MED ORDER — AMLODIPINE BESYLATE 5 MG PO TABS: 5.0000 mg | ORAL_TABLET | Freq: Two times a day (BID) | ORAL | 1 refills | Status: DC

## 2016-06-29 NOTE — Progress Notes (Signed)
Pre visit review using our clinic review tool, if applicable. No additional management support is needed unless otherwise documented below in the visit note. 

## 2016-06-29 NOTE — Patient Instructions (Addendum)
100-140/60-90 is the blood pressure goal  Elevate feet above heart for 15 minutes, minimize sodium, try compression socks, [place], [employer]. Report worsening  Hypertension Hypertension, commonly called high blood pressure, is when the force of blood pumping through your arteries is too strong. Your arteries are the blood vessels that carry blood from your heart throughout your body. A blood pressure reading consists of a higher number over a lower number, such as 110/72. The higher number (systolic) is the pressure inside your arteries when your heart pumps. The lower number (diastolic) is the pressure inside your arteries when your heart relaxes. Ideally you want your blood pressure below 120/80. Hypertension forces your heart to work harder to pump blood. Your arteries may become narrow or stiff. Having untreated or uncontrolled hypertension can cause heart attack, stroke, kidney disease, and other problems. RISK FACTORS Some risk factors for high blood pressure are controllable. Others are not.  Risk factors you cannot control include:   Race. You may be at higher risk if you are African American.  Age. Risk increases with age.  Gender. Men are at higher risk than women before age 27 years. After age 65, women are at higher risk than men. Risk factors you can control include:  Not getting enough exercise or physical activity.  Being overweight.  Getting too much fat, sugar, calories, or salt in your diet.  Drinking too much alcohol. SIGNS AND SYMPTOMS Hypertension does not usually cause signs or symptoms. Extremely high blood pressure (hypertensive crisis) may cause headache, anxiety, shortness of breath, and nosebleed. DIAGNOSIS To check if you have hypertension, your health care provider will measure your blood pressure while you are seated, with your arm held at the level of your heart. It should be measured at least twice using the same arm. Certain conditions can cause  a difference in blood pressure between your right and left arms. A blood pressure reading that is higher than normal on one occasion does not mean that you need treatment. If it is not clear whether you have high blood pressure, you may be asked to return on a different day to have your blood pressure checked again. Or, you may be asked to monitor your blood pressure at home for 1 or more weeks. TREATMENT Treating high blood pressure includes making lifestyle changes and possibly taking medicine. Living a healthy lifestyle can help lower high blood pressure. You may need to change some of your habits. Lifestyle changes may include:  Following the DASH diet. This diet is high in fruits, vegetables, and whole grains. It is low in salt, red meat, and added sugars.  Keep your sodium intake below 2,300 mg per day.  Getting at least 30-45 minutes of aerobic exercise at least 4 times per week.  Losing weight if necessary.  Not smoking.  Limiting alcoholic beverages.  Learning ways to reduce stress. Your health care provider may prescribe medicine if lifestyle changes are not enough to get your blood pressure under control, and if one of the following is true:  You are 34-56 years of age and your systolic blood pressure is above 140.  You are 41 years of age or older, and your systolic blood pressure is above 150.  Your diastolic blood pressure is above 90.  You have diabetes, and your systolic blood pressure is over [id number] or your diastolic blood pressure is over 90.  You have kidney disease and your blood pressure is above 140/90.  You have heart disease and  your blood pressure is above 140/90. Your personal target blood pressure may vary depending on your medical conditions, your age, and other factors. HOME CARE INSTRUCTIONS  Have your blood pressure rechecked as directed by your health care provider.   Take medicines only as directed by your health care provider. Follow the directions  carefully. Blood pressure medicines must be taken as prescribed. The medicine does not work as well when you skip doses. Skipping doses also puts you at risk for problems.  Do not smoke.   Monitor your blood pressure at home as directed by your health care provider. SEEK MEDICAL CARE IF:   You think you are having a reaction to medicines taken.  You have recurrent headaches or feel dizzy.  You have swelling in your ankles.  You have trouble with your vision. SEEK IMMEDIATE MEDICAL CARE IF:  You develop a severe headache or confusion.  You have unusual weakness, numbness, or feel faint.  You have severe chest or abdominal pain.  You vomit repeatedly.  You have trouble breathing. MAKE SURE YOU:   Understand these instructions.  Will watch your condition.  Will get help right away if you are not doing well or get worse.   This information is not intended to replace advice given to you by your health care provider. Make sure you discuss any questions you have with your health care provider.   Document Released: 10/23/2005 Document Revised: 03/09/2015 Document Reviewed: 08/15/2013 Elsevier Interactive Patient Education [employer].

## 2016-06-30 LAB — CBC
HCT: 43 % (ref 39.0–52.0)
Hemoglobin: 14.7 g/dL (ref 13.0–17.0)
MCHC: 34.3 g/dL (ref 30.0–36.0)
MCV: 91.7 fl (ref 78.0–100.0)
PLATELETS: 173 10*3/uL (ref 150.0–400.0)
RBC: 4.69 Mil/uL (ref 4.22–5.81)
RDW: 14.9 % (ref 11.5–15.5)
WBC: 10.1 10*3/uL (ref 4.0–10.5)

## 2016-06-30 LAB — COMPREHENSIVE METABOLIC PANEL
ALK PHOS: 50 U/L (ref 39–117)
ALT: 16 U/L (ref 0–53)
AST: 24 U/L (ref 0–37)
Albumin: 4.4 g/dL (ref 3.5–5.2)
BUN: 21 mg/dL (ref 6–23)
CHLORIDE: 104 meq/L (ref 96–112)
CO2: 19 meq/L (ref 19–32)
Calcium: 9.3 mg/dL (ref 8.4–10.5)
Creatinine, Ser: 1.29 mg/dL (ref 0.40–1.50)
GFR: 56.09 mL/min — AB (ref >60.00)
GLUCOSE: 90 mg/dL (ref 70–99)
POTASSIUM: 4.5 meq/L (ref 3.5–5.1)
Sodium: 138 mEq/L (ref 135–145)
Total Bilirubin: 0.5 mg/dL (ref 0.2–1.2)
Total Protein: 7.9 g/dL (ref 6.0–8.3)

## 2016-06-30 LAB — HEMOGLOBIN A1C: Hgb A1c MFr Bld: 5.8 % (ref 4.6–6.5)

## 2016-06-30 LAB — LIPID PANEL
CHOL/HDL RATIO: 3
Cholesterol: 149 mg/dL (ref 0–200)
HDL: 42.8 mg/dL (ref >39.00)
LDL Cholesterol: 71 mg/dL (ref 0–99)
NONHDL: 106.47
Triglycerides: 175 mg/dL — ABNORMAL HIGH (ref 0.0–149.0)
VLDL: 35 mg/dL (ref 0.0–40.0)

## 2016-06-30 LAB — TSH: TSH: 2.25 u[IU]/mL (ref 0.35–4.50)

## 2016-07-03 ENCOUNTER — Telehealth: Payer: Self-pay | Admitting: Family Medicine

## 2016-07-03 NOTE — Telephone Encounter (Signed)
Returning call regarding lab work

## 2016-07-03 NOTE — Telephone Encounter (Signed)
Patient informed of lab results. 

## 2016-07-04 ENCOUNTER — Encounter (INDEPENDENT_AMBULATORY_CARE_PROVIDER_SITE_OTHER): Payer: Self-pay

## 2016-07-16 NOTE — Assessment & Plan Note (Signed)
Encouraged moist heat and gentle stretching as tolerated. May try NSAIDs and prescription meds as directed and report if symptoms worsen or seek immediate care 

## 2016-07-16 NOTE — Progress Notes (Signed)
Patient ID: XAI MUSCATO, male   DOB: 11-22-1927, 82 y.o.   MRN: [id number]   Subjective:    Patient ID: Talbert Nan, male    DOB: 04/12/1928, 82 y.o.   MRN: [id number]  Chief Complaint  Patient presents with  . Follow-up    Routine    HPI Patient is in today for follow up. He feels well today. No recent illness or acute concerns. Continues to experience a low appetite but is able to eat because he knows he is supposed to. No nausea, diarrhea or abdominal pain. Denies CP/palp/SOB/HA/congestion/fevers/GI or GU c/o. Taking meds as prescribed  Past Medical History:  Diagnosis Date  . Adenomatous colon polyp   . BPH (benign prostatic hyperplasia) 07/14/2013  . CAD (coronary artery disease)   . Carotid stenosis, bilateral    right 60-79% stenosed.  Left 40-59%  . Chronic kidney disease    chronic, stage II  . CRI (chronic renal insufficiency)   . Diverticulosis   . Dysphagia   . Esophageal stricture    Dr [name]  . Glaucoma   . Headache(784.0) 12/01/2013  . Hyperlipidemia   . Hypertension   . Insomnia 05/12/2015  . Low back pain syndrome 04/03/2014  . Medicare annual wellness visit, subsequent 02/04/2015  . Memory loss   . Postural lightheadedness   . PVD (peripheral vascular disease) ([place])   . Renal insufficiency   . Sensorineural hearing loss, bilateral   . Unspecified constipation 12/29/2012    Past Surgical History:  Procedure Laterality Date  . ABDOMINAL AORTIC ANEURYSM REPAIR  1996   Dr [name]  . CORONARY ARTERY BYPASS GRAFT    . EYE SURGERY     cataracts b/l and laser  . HERNIA REPAIR  03/23/05   left inguinal Lichtenstein repair, with mesh.  [name]  . RENAL ARTERY STENT  1996   Dr [name].  Bilateral stenting    Family History  Problem Relation Age of Onset  . Heart disease Father     CAD  . Coronary artery disease Father   . Stroke Father   . Heart disease Son   . Heart disease Brother     MI  . Cancer Neg Hx   . Diabetes Neg Hx     Social  History   Social History  . Marital status: Married    Spouse name: N/A  . Number of children: 3  . Years of education: N/A   Occupational History  . 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 Smoking status: Former Research scientist (life sciences)  . Smokeless tobacco: Never Used     Comment: quit 1980  . Alcohol use 0.6 oz/week    1 Glasses of wine per week  . Drug use: No  . Sexual activity: Yes     Comment: lives with wife, retired, no dietary restriction   Other Topics Concern  . Not on file   Social History Narrative  . No narrative on file    Outpatient Medications Prior to Visit  Medication Sig Dispense Refill  . acetaminophen (TYLENOL) 500 MG tablet Take 2 tablets (1,000 mg total) by mouth 2 (two) times daily. 30 tablet 0  . aspirin EC 81 MG EC tablet Take 81 mg by mouth at bedtime.     [name] Kitchen atorvastatin (LIPITOR) 40 MG tablet TAKE 1 TABLET EVERY DAY 90 tablet 1  . finasteride (PROSCAR) 5 MG tablet TAKE 1 TABLET EVERY DAY 90 tablet 2  . losartan (  COZAAR) 50 MG tablet TAKE 1 TABLET TWICE DAILY 180 tablet 2  . LUMIGAN 0.01 % SOLN     . metoprolol succinate (TOPROL-XL) 25 MG 24 hr tablet TAKE 1 TABLET EVERY DAY WITH SUPPER 90 tablet 2  . pantoprazole (PROTONIX) 40 MG tablet TAKE 1 TABLET EVERY DAY 90 tablet 2  . timolol (TIMOPTIC) 0.5 % ophthalmic solution Place 1 drop into both eyes 1 day or 1 dose.    [name] Kitchen amLODipine (NORVASC) 5 MG tablet Take 1 tablet (5 mg total) by mouth daily. Increase to 2 if needed 60 tablet 5  . baclofen (LIORESAL) 10 MG tablet Take 1 tablet (10 mg total) by mouth 2 (two) times daily. (Patient not taking: Reported on 06/29/2016) 30 each 1   No facility-administered medications prior to visit.     No Known Allergies  Review of Systems  Constitutional: Positive for malaise/fatigue. Negative for fever.  HENT: Negative for congestion.   Eyes: Negative for blurred vision.  Respiratory: Negative for shortness of breath.   Cardiovascular: Negative  for chest pain, palpitations and leg swelling.  Gastrointestinal: Negative for abdominal pain, blood in stool and nausea.  Genitourinary: Negative for dysuria and frequency.  Musculoskeletal: Negative for falls.  Skin: Negative for rash.  Neurological: Negative for dizziness, loss of consciousness and headaches.  Endo/Heme/Allergies: Negative for environmental allergies.  Psychiatric/Behavioral: Negative for depression. The patient is not nervous/anxious.        Objective:    Physical Exam  Constitutional: He is oriented to person, place, and time. He appears well-developed and well-nourished. No distress.  HENT:  Head: Normocephalic and atraumatic.  Nose: Nose normal.  Eyes: Right eye exhibits no discharge. Left eye exhibits no discharge.  Neck: Normal range of motion. Neck supple.  Cardiovascular: Normal rate and regular rhythm.   No murmur heard. Pulmonary/Chest: Effort normal and breath sounds normal.  Abdominal: Soft. Bowel sounds are normal. There is no tenderness.  Musculoskeletal: He exhibits no edema.  Neurological: He is alert and oriented to person, place, and time.  Skin: Skin is warm and dry.  Psychiatric: He has a normal mood and affect.  Nursing note and vitals reviewed.   BP 126/76 (BP Location: Right Arm, Patient Position: Sitting, Cuff Size: Normal)   Pulse 76   Temp 98.1 F (36.7 C) (Oral)   Resp 14   Ht 5\' 9"  (1.753 m)   Wt 169 lb 6 oz (76.8 kg)   BMI 25.01 kg/m  Wt Readings from Last 3 Encounters:  06/29/16 169 lb 6 oz (76.8 kg)  03/29/16 170 lb 9.6 oz (77.4 kg)  03/27/16 173 lb (78.5 kg)     Lab Results  Component Value Date   WBC 10.1 06/29/2016   HGB 14.7 06/29/2016   HCT 43.0 06/29/2016   PLT 173.0 06/29/2016   GLUCOSE 90 06/29/2016   CHOL 149 06/29/2016   TRIG 175.0 (H) 06/29/2016   HDL 42.80 06/29/2016   LDLCALC 71 06/29/2016   ALT 16 06/29/2016   AST 24 06/29/2016   NA 138 06/29/2016   K 4.5 06/29/2016   CL 104 06/29/2016    CREATININE 1.29 06/29/2016   BUN 21 06/29/2016   CO2 19 06/29/2016   TSH 2.25 06/29/2016   PSA 0.75 10/06/2013   INR 1.06 10/27/2010   HGBA1C 5.8 06/29/2016   MICROALBUR 0.87 10/06/2013    Lab Results  Component Value Date   TSH 2.25 06/29/2016   Lab Results  Component Value Date   WBC 10.1  06/29/2016   HGB 14.7 06/29/2016   HCT 43.0 06/29/2016   MCV 91.7 06/29/2016   PLT 173.0 06/29/2016   Lab Results  Component Value Date   NA 138 06/29/2016   K 4.5 06/29/2016   CO2 19 06/29/2016   GLUCOSE 90 06/29/2016   BUN 21 06/29/2016   CREATININE 1.29 06/29/2016   BILITOT 0.5 06/29/2016   ALKPHOS 50 06/29/2016   AST 24 06/29/2016   ALT 16 06/29/2016   PROT 7.9 06/29/2016   ALBUMIN 4.4 06/29/2016   CALCIUM 9.3 06/29/2016   ANIONGAP 9 03/27/2016   GFR 56.09 (L) 06/29/2016   Lab Results  Component Value Date   CHOL 149 06/29/2016   Lab Results  Component Value Date   HDL 42.80 06/29/2016   Lab Results  Component Value Date   LDLCALC 71 06/29/2016   Lab Results  Component Value Date   TRIG 175.0 (H) 06/29/2016   Lab Results  Component Value Date   CHOLHDL 3 06/29/2016   Lab Results  Component Value Date   HGBA1C 5.8 06/29/2016       Assessment & Plan:   Problem List Items Addressed This Visit    Hyperlipidemia, mixed    Tolerating statin, encouraged heart healthy diet, avoid trans fats, minimize simple carbs and saturated fats. Increase exercise as tolerated      Relevant Medications   amLODipine (NORVASC) 5 MG tablet   Essential hypertension    Well controlled, no changes to meds. Encouraged heart healthy diet such as the DASH diet and exercise as tolerated.       Relevant Medications   amLODipine (NORVASC) 5 MG tablet   Other Relevant Orders   Comprehensive metabolic panel (Completed)   TSH (Completed)   CBC (Completed)   Carotid artery disease (H[facility])   Relevant Medications   amLODipine (NORVASC) 5 MG tablet   Hyperglycemia - Primary     hgba1c acceptable, minimize simple carbs. Increase exercise as tolerated.       Relevant Orders   Lipid panel (Completed)   Hemoglobin A1c (Completed)   Constipation   BPH (benign prostatic hyperplasia)   Low back pain syndrome    Encouraged moist heat and gentle stretching as tolerated. May try NSAIDs and prescription meds as directed and report if symptoms worsen or seek immediate care      Gastroesophageal reflux disease without esophagitis    Other Visit Diagnoses    Encounter for immunization       Relevant Orders   Flu vaccine HIGH DOSE PF (Completed)      I have changed Mr. Grilliot's amLODipine. I am also having him maintain his aspirin EC, LUMIGAN, timolol, baclofen, acetaminophen, pantoprazole, metoprolol succinate, finasteride, losartan, and atorvastatin.  Meds ordered this encounter  Medications  . amLODipine (NORVASC) 5 MG tablet    Sig: Take 1 tablet (5 mg total) by mouth 2 (two) times daily.    Dispense:  180 tablet    Refill:  1     Penni Homans, MD

## 2016-07-16 NOTE — Assessment & Plan Note (Signed)
hgba1c acceptable, minimize simple carbs. Increase exercise as tolerated.  

## 2016-07-16 NOTE — Assessment & Plan Note (Signed)
Well controlled, no changes to meds. Encouraged heart healthy diet such as the DASH diet and exercise as tolerated.  °

## 2016-07-16 NOTE — Assessment & Plan Note (Signed)
Tolerating statin, encouraged heart healthy diet, avoid trans fats, minimize simple carbs and saturated fats. Increase exercise as tolerated 

## 2016-08-03 ENCOUNTER — Telehealth: Payer: Self-pay | Admitting: Family Medicine

## 2016-08-03 NOTE — Telephone Encounter (Signed)
Allie from [place] [facility] called requesting we contacted insurance to get a back date referral for DOS 05/19/16 by Dr Karl Luke. Our office did not refer the patient, nor were we contacted by their office or patient to obtain insurance auth. I spoke with [place] with [facility] @ [place] and was told they are unable to back date referras, Louanne Belton was made aware.

## 2016-11-03 ENCOUNTER — Other Ambulatory Visit: Payer: Self-pay | Admitting: Family Medicine

## 2016-11-21 DIAGNOSIS — H26492 Other secondary cataract, left eye: Secondary | ICD-10-CM | POA: Diagnosis not present

## 2016-11-21 DIAGNOSIS — H524 Presbyopia: Secondary | ICD-10-CM | POA: Diagnosis not present

## 2016-11-21 DIAGNOSIS — Z961 Presence of intraocular lens: Secondary | ICD-10-CM | POA: Diagnosis not present

## 2016-11-21 DIAGNOSIS — H401132 Primary open-angle glaucoma, bilateral, moderate stage: Secondary | ICD-10-CM | POA: Diagnosis not present

## 2016-12-18 ENCOUNTER — Other Ambulatory Visit: Payer: Self-pay | Admitting: Family Medicine

## 2016-12-26 ENCOUNTER — Ambulatory Visit (INDEPENDENT_AMBULATORY_CARE_PROVIDER_SITE_OTHER): Payer: Medicare HMO | Admitting: Family Medicine

## 2016-12-26 ENCOUNTER — Encounter: Payer: Self-pay | Admitting: Family Medicine

## 2016-12-26 VITALS — BP 124/64 | HR 66 | Temp 97.9°F | Resp 18 | Wt 166.6 lb

## 2016-12-26 DIAGNOSIS — E782 Mixed hyperlipidemia: Secondary | ICD-10-CM | POA: Diagnosis not present

## 2016-12-26 DIAGNOSIS — R739 Hyperglycemia, unspecified: Secondary | ICD-10-CM

## 2016-12-26 DIAGNOSIS — R531 Weakness: Secondary | ICD-10-CM

## 2016-12-26 DIAGNOSIS — I10 Essential (primary) hypertension: Secondary | ICD-10-CM | POA: Diagnosis not present

## 2016-12-26 DIAGNOSIS — G4700 Insomnia, unspecified: Secondary | ICD-10-CM

## 2016-12-26 LAB — GLUCOSE, POCT (MANUAL RESULT ENTRY): POC GLUCOSE: 104 mg/dL — AB (ref 70–99)

## 2016-12-26 NOTE — Assessment & Plan Note (Signed)
Encouraged moist heat and gentle stretching as tolerated. May try NSAIDs and prescription meds as directed and report if symptoms worsen or seek immediate care 

## 2016-12-26 NOTE — Assessment & Plan Note (Signed)
Encouraged heart healthy diet, increase exercise, avoid trans fats, consider a krill oil cap daily 

## 2016-12-26 NOTE — Progress Notes (Signed)
Pre visit review using our clinic review tool, if applicable. No additional management support is needed unless otherwise documented below in the visit note. 

## 2016-12-26 NOTE — Assessment & Plan Note (Signed)
Well controlled, no changes to meds. Encouraged heart healthy diet such as the DASH diet and exercise as tolerated.  °

## 2016-12-26 NOTE — Assessment & Plan Note (Signed)
minimize simple carbs. Increase exercise as tolerated. Just started feeling weak when he came in here this am and has not eaten. FSG was 104 today

## 2016-12-26 NOTE — Patient Instructions (Signed)
Lidocaine gel Aspercreme, Icy Hot, or Salon Pas makes this  Back Pain, Adult Introduction Back pain is very common. The pain often gets better over time. The cause of back pain is usually not dangerous. Mos  t people can learn to manage their back pain on their own. Follow these instructions at home: Watch your back pain for any changes. The following actions may help to lessen any pain you are feeling:  Stay active. Start with short walks on flat ground if you can. Try to walk farther each day.  Exercise regularly as told by your doctor. Exercise helps your back heal faster. It also helps avoid future injury by keeping your muscles strong and flexible.  Do not sit, drive, or stand in one place for more than 30 minutes.  Do not stay in bed. Resting more than 1-2 days can slow down your recovery.  Be careful when you bend or lift an object. Use good form when lifting:  Bend at your knees.  Keep the object close to your body.  Do not twist.  Sleep on a firm mattress. Lie on your side, and bend your knees. If you lie on your back, put a pillow under your knees.  Take medicines only as told by your doctor.  Put ice on the injured area.  Put ice in a plastic bag.  Place a towel between your skin and the bag.  Leave the ice on for 20 minutes, 2-3 times a day for the first 2-3 days. After that, you can switch between ice and heat packs.  Avoid feeling anxious or stressed. Find good ways to deal with stress, such as exercise.  Maintain a healthy weight. Extra weight puts stress on your back. Contact a doctor if:  You have pain that does not go away with rest or medicine.  You have worsening pain that goes down into your legs or buttocks.  You have pain that does not get better in one week.  You have pain at night.  You lose weight.  You have a fever or chills. Get help right away if:  You cannot control when you poop (bowel movement) or pee (urinate).  Your arms or  legs feel weak.  Your arms or legs lose feeling (numbness).  You feel sick to your stomach (nauseous) or throw up (vomit).  You have belly (abdominal) pain.  You feel like you may pass out (faint). This information is not intended to replace advice given to you by your health care provider. Make sure you discuss any questions you have with your health care provider. Document Released: 04/10/2008 Document Revised: 03/30/2016 Document Reviewed: 02/24/2014  2017 Elsevier

## 2016-12-26 NOTE — Assessment & Plan Note (Signed)
Generally sleeping better but did not sleep well last night due to appt today

## 2016-12-26 NOTE — Progress Notes (Signed)
Patient ID: Troy Ray, male   DOB: 03-Aug-1928, 83 y.o.   MRN: [id number]   Subjective:    Patient ID: Troy Ray, male    DOB: [birth year], 83 y.o.   MRN: [id number]  Chief Complaint  Patient presents with  . Follow-up  . Hypertension  . Hyperglycemia  I acted as a Education administrator for Dr. Charlett Blake. Kellene Mccleary, RMA   Hypertension  This is a chronic problem. Associated symptoms include malaise/fatigue. Pertinent negatives include no blurred vision, chest pain, headaches, palpitations or shortness of breath.  Hyperglycemia  This is a recurrent problem. The problem has been unchanged. Associated symptoms include weakness. Pertinent negatives include no chest pain, congestion, coughing, fever, headaches, nausea, rash or vomiting.    Patient is in today for 6 month follow up for hypertension, hyperglycemia and other medical concerns. Patient in today complaining of not feeling well now. He reports feeling well yesterday and even first thing this morning. Then when getting out of the car at the building here he began to feel weak but no other associated symptoms such as presyncope, diaphoresis, nausea. Denies CP/palp/SOB/HA/congestion/fevers/GI or GU c/o. Taking meds as prescribed. He has not eaten or drunk anything this am so far.   Past Medical History:  Diagnosis Date  . Adenomatous colon polyp   . BPH (benign prostatic hyperplasia) 07/14/2013  . CAD (coronary artery disease)   . Carotid stenosis, bilateral    right 60-79% stenosed.  Left 40-59%  . Chronic kidney disease    chronic, stage II  . CRI (chronic renal insufficiency)   . Diverticulosis   . Dysphagia   . Esophageal stricture    Dr [name]  . Glaucoma   . Headache(784.0) 12/01/2013  . Hyperlipidemia   . Hypertension   . Insomnia 05/12/2015  . Low back pain syndrome 04/03/2014  . Medicare annual wellness visit, subsequent 02/04/2015  . Memory loss   . Postural lightheadedness   . PVD (peripheral vascular disease) ([place])   . Renal  insufficiency   . Sensorineural hearing loss, bilateral   . Unspecified constipation 12/29/2012    Past Surgical History:  Procedure Laterality Date  . ABDOMINAL AORTIC ANEURYSM REPAIR  1996   Dr [name]  . CORONARY ARTERY BYPASS GRAFT    . EYE SURGERY     cataracts b/l and laser  . HERNIA REPAIR  03/23/05   left inguinal Lichtenstein repair, with mesh.  [name]  . RENAL ARTERY STENT  1996   Dr [name].  Bilateral stenting    Family History  Problem Relation Age of Onset  . Heart disease Father     CAD  . Coronary artery disease Father   . Stroke Father   . Heart disease Son   . Heart disease Brother     MI  . Cancer Neg Hx   . Diabetes Neg Hx     Social History   Social History  . Marital status: Married    Spouse name: N/A  . Number of children: 3  . Years of education: N/A   Occupational History  . 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 Smoking status: Former Research scientist (life sciences)  . Smokeless tobacco: Never Used     Comment: quit 1980  . Alcohol use 0.6 oz/week    1 Glasses of wine per week  . Drug use: No  . Sexual activity: Yes     Comment: lives with wife, retired, no dietary restriction   Other Topics  Concern  . Not on file   Social History Narrative  . No narrative on file    Outpatient Medications Prior to Visit  Medication Sig Dispense Refill  . acetaminophen (TYLENOL) 500 MG tablet Take 2 tablets (1,000 mg total) by mouth 2 (two) times daily. 30 tablet 0  . amLODipine (NORVASC) 5 MG tablet Take 1 tablet (5 mg total) by mouth 2 (two) times daily. 180 tablet 1  . aspirin EC 81 MG EC tablet Take 81 mg by mouth at bedtime.     [name] Kitchen atorvastatin (LIPITOR) 40 MG tablet TAKE 1 TABLET EVERY DAY 90 tablet 0  . finasteride (PROSCAR) 5 MG tablet TAKE 1 TABLET EVERY DAY 90 tablet 2  . losartan (COZAAR) 50 MG tablet TAKE 1 TABLET TWICE DAILY 180 tablet 2  . LUMIGAN 0.01 % SOLN     . metoprolol succinate (TOPROL-XL) 25 MG 24 hr tablet  TAKE 1 TABLET EVERY DAY WITH SUPPER 90 tablet 2  . pantoprazole (PROTONIX) 40 MG tablet TAKE 1 TABLET EVERY DAY 90 tablet 2  . timolol (TIMOPTIC) 0.5 % ophthalmic solution Place 1 drop into both eyes 1 day or 1 dose.    . baclofen (LIORESAL) 10 MG tablet Take 1 tablet (10 mg total) by mouth 2 (two) times daily. (Patient not taking: Reported on 06/29/2016) 30 each 1   No facility-administered medications prior to visit.     No Known Allergies  Review of Systems  Constitutional: Positive for malaise/fatigue. Negative for fever.  HENT: Negative for congestion.   Eyes: Negative for blurred vision.  Respiratory: Negative for cough and shortness of breath.   Cardiovascular: Negative for chest pain, palpitations and leg swelling.  Gastrointestinal: Negative for nausea and vomiting.  Musculoskeletal: Negative for back pain.  Skin: Negative for rash.  Neurological: Positive for weakness. Negative for dizziness, focal weakness, loss of consciousness and headaches.       Objective:    Physical Exam  Constitutional: He is oriented to person, place, and time. He appears well-developed and well-nourished. No distress.  HENT:  Head: Normocephalic and atraumatic.  Eyes: Conjunctivae are normal.  Neck: Normal range of motion. No thyromegaly present.  Cardiovascular: Normal rate.   Murmur heard. 2/6 Systolic murmur  Pulmonary/Chest: Effort normal and breath sounds normal. He has no wheezes.  Abdominal: Soft. Bowel sounds are normal. There is no tenderness.  Musculoskeletal: Normal range of motion. He exhibits no edema or deformity.  Neurological: He is alert and oriented to person, place, and time.  Skin: Skin is warm and dry. He is not diaphoretic.  Psychiatric: He has a normal mood and affect.    BP 124/64 (BP Location: Left Arm, Patient Position: Sitting, Cuff Size: Normal)   Pulse 66   Temp 97.9 F (36.6 C) (Oral)   Resp 18   Wt 166 lb 9.6 oz (75.6 kg)   SpO2 98%   BMI 24.60 kg/m    Wt Readings from Last 3 Encounters:  12/26/16 166 lb 9.6 oz (75.6 kg)  06/29/16 169 lb 6 oz (76.8 kg)  03/29/16 170 lb 9.6 oz (77.4 kg)     Lab Results  Component Value Date   WBC 10.1 06/29/2016   HGB 14.7 06/29/2016   HCT 43.0 06/29/2016   PLT 173.0 06/29/2016   GLUCOSE 90 06/29/2016   CHOL 149 06/29/2016   TRIG 175.0 (H) 06/29/2016   HDL 42.80 06/29/2016   LDLCALC 71 06/29/2016   ALT 16 06/29/2016   AST 24 06/29/2016  NA 138 06/29/2016   K 4.5 06/29/2016   CL 104 06/29/2016   CREATININE 1.29 06/29/2016   BUN 21 06/29/2016   CO2 19 06/29/2016   TSH 2.25 06/29/2016   PSA 0.75 10/06/2013   INR 1.06 10/27/2010   HGBA1C 5.8 06/29/2016   MICROALBUR 0.87 10/06/2013    Lab Results  Component Value Date   TSH 2.25 06/29/2016   Lab Results  Component Value Date   WBC 10.1 06/29/2016   HGB 14.7 06/29/2016   HCT 43.0 06/29/2016   MCV 91.7 06/29/2016   PLT 173.0 06/29/2016   Lab Results  Component Value Date   NA 138 06/29/2016   K 4.5 06/29/2016   CO2 19 06/29/2016   GLUCOSE 90 06/29/2016   BUN 21 06/29/2016   CREATININE 1.29 06/29/2016   BILITOT 0.5 06/29/2016   ALKPHOS 50 06/29/2016   AST 24 06/29/2016   ALT 16 06/29/2016   PROT 7.9 06/29/2016   ALBUMIN 4.4 06/29/2016   CALCIUM 9.3 06/29/2016   ANIONGAP 9 03/27/2016   GFR 56.09 (L) 06/29/2016   Lab Results  Component Value Date   CHOL 149 06/29/2016   Lab Results  Component Value Date   HDL 42.80 06/29/2016   Lab Results  Component Value Date   LDLCALC 71 06/29/2016   Lab Results  Component Value Date   TRIG 175.0 (H) 06/29/2016   Lab Results  Component Value Date   CHOLHDL 3 06/29/2016   Lab Results  Component Value Date   HGBA1C 5.8 06/29/2016       Assessment & Plan:   Problem List Items Addressed This Visit    Hyperlipidemia, mixed    Encouraged heart healthy diet, increase exercise, avoid trans fats, consider a krill oil cap daily      Relevant Orders   Lipid panel    Lipid panel   Essential hypertension - Primary    Well controlled, no changes to meds. Encouraged heart healthy diet such as the DASH diet and exercise as tolerated.       Relevant Orders   CBC   Comprehensive metabolic panel   TSH   CBC   Comprehensive metabolic panel   TSH   Hyperglycemia    minimize simple carbs. Increase exercise as tolerated. Just started feeling weak when he came in here this am and has not eaten. FSG was 104 today      Relevant Orders   Hemoglobin A1c   Hemoglobin A1c   Insomnia    Generally sleeping better but did not sleep well last night due to appt today       Other Visit Diagnoses    Weak       Relevant Orders   POCT Glucose (CBG) (Completed)      I have discontinued Mr. Solanki's baclofen. I am also having him maintain his aspirin EC, LUMIGAN, timolol, acetaminophen, pantoprazole, metoprolol succinate, finasteride, amLODipine, atorvastatin, and losartan.  No orders of the defined types were placed in this encounter.   CMA served as Education administrator during this visit. History, Physical and Plan performed by medical provider. Documentation and orders reviewed and attested to.  Penni Homans, MD

## 2016-12-27 ENCOUNTER — Other Ambulatory Visit (INDEPENDENT_AMBULATORY_CARE_PROVIDER_SITE_OTHER): Payer: Medicare HMO

## 2016-12-27 DIAGNOSIS — R739 Hyperglycemia, unspecified: Secondary | ICD-10-CM

## 2016-12-27 DIAGNOSIS — E782 Mixed hyperlipidemia: Secondary | ICD-10-CM | POA: Diagnosis not present

## 2016-12-27 DIAGNOSIS — I10 Essential (primary) hypertension: Secondary | ICD-10-CM | POA: Diagnosis not present

## 2016-12-27 LAB — COMPREHENSIVE METABOLIC PANEL
ALBUMIN: 4 g/dL (ref 3.5–5.2)
ALK PHOS: 51 U/L (ref 39–117)
ALT: 17 U/L (ref 0–53)
AST: 21 U/L (ref 0–37)
BUN: 22 mg/dL (ref 6–23)
CALCIUM: 9 mg/dL (ref 8.4–10.5)
CHLORIDE: 106 meq/L (ref 96–112)
CO2: 26 mEq/L (ref 19–32)
Creatinine, Ser: 1.2 mg/dL (ref 0.40–1.50)
GFR: 60.9 mL/min (ref >60.00)
Glucose, Bld: 112 mg/dL — ABNORMAL HIGH (ref 70–99)
POTASSIUM: 4 meq/L (ref 3.5–5.1)
SODIUM: 137 meq/L (ref 135–145)
TOTAL PROTEIN: 6.9 g/dL (ref 6.0–8.3)
Total Bilirubin: 0.6 mg/dL (ref 0.2–1.2)

## 2016-12-27 LAB — CBC
HEMATOCRIT: 41.1 % (ref 39.0–52.0)
HEMOGLOBIN: 13.7 g/dL (ref 13.0–17.0)
MCHC: 33.4 g/dL (ref 30.0–36.0)
MCV: 92.9 fl (ref 78.0–100.0)
PLATELETS: 238 10*3/uL (ref 150.0–400.0)
RBC: 4.42 Mil/uL (ref 4.22–5.81)
RDW: 14.4 % (ref 11.5–15.5)
WBC: 8.6 10*3/uL (ref 4.0–10.5)

## 2016-12-27 LAB — TSH: TSH: 3.68 u[IU]/mL (ref 0.35–4.50)

## 2016-12-27 LAB — LIPID PANEL
CHOLESTEROL: 142 mg/dL (ref 0–200)
HDL: 37.4 mg/dL — ABNORMAL LOW (ref >39.00)
LDL CALC: 77 mg/dL (ref 0–99)
NonHDL: 104.4
TRIGLYCERIDES: 135 mg/dL (ref 0.0–149.0)
Total CHOL/HDL Ratio: 4
VLDL: 27 mg/dL (ref 0.0–40.0)

## 2016-12-27 LAB — HEMOGLOBIN A1C: Hgb A1c MFr Bld: 6 % (ref 4.6–6.5)

## 2017-01-01 ENCOUNTER — Other Ambulatory Visit: Payer: Self-pay | Admitting: Family Medicine

## 2017-01-01 ENCOUNTER — Telehealth: Payer: Self-pay | Admitting: Family Medicine

## 2017-01-01 ENCOUNTER — Encounter: Payer: Commercial Managed Care - HMO | Admitting: Family Medicine

## 2017-01-01 DIAGNOSIS — I10 Essential (primary) hypertension: Secondary | ICD-10-CM

## 2017-01-01 MED ORDER — AMLODIPINE BESYLATE 5 MG PO TABS: 5.0000 mg | ORAL_TABLET | Freq: Two times a day (BID) | ORAL | 0 refills | Status: DC

## 2017-01-01 NOTE — Telephone Encounter (Signed)
Sent in BP medication to local pharmacy and patient aware. Also informed the wife on his lab results dated 12/27/2016

## 2017-01-01 NOTE — Telephone Encounter (Signed)
°  Relation to [id number] Call back number:[phone] Pharmacy:  [facility], [place] [phone] (Phone) [phone] (Fax)    Reason for call:  Patient inquiring about lab results and requesting a few pills of please send to retail to hold patient over until mail order is received.

## 2017-02-16 ENCOUNTER — Encounter: Payer: Self-pay | Admitting: *Deleted

## 2017-03-06 DIAGNOSIS — R69 Illness, unspecified: Secondary | ICD-10-CM | POA: Diagnosis not present

## 2017-03-07 ENCOUNTER — Other Ambulatory Visit: Payer: Self-pay | Admitting: Family Medicine

## 2017-03-13 NOTE — Progress Notes (Signed)
Pre visit review using our clinic review tool, if applicable. No additional management support is needed unless otherwise documented below in the visit note. 

## 2017-03-13 NOTE — Progress Notes (Signed)
Subjective:   Troy Ray is a 83 y.o. male who presents for Medicare Annual/Subsequent preventive examination.  He notes he has had ongoing trouble with dependent lower extremity edema, no redness or pain. He states he was instructed by PCP to elevate the legs for 15 minutes at a time throughout the day and decrease dietary sodium. He reports he is doing both of these things but still has swelling. This is not bothersome to him and does not limit his ROM.e states 'if I didn't look at my legs I wouldn't even notice it.'    He also notes some ongoing issues with balance and leg weakness. He did PT in the past but 'it didn't do any good.' He then admitted that it helped while he was doing it, but he did not continue exercises at home after formal PT was finished and then he noted a decline.  Review of Systems:  No ROS.  Medicare Wellness Visit.  Cardiac Risk Factors include: advanced age (>61men, >54 women);hypertension;male gender;dyslipidemia  Sleep patterns: gets up 3 times nightly to void and sleeps 6 hours nightly. Feels rested on waking.   Home Safety/Smoke Alarms: Feels safe in home. Smoke alarms in place.  Living environment; residence and Firearm Safety: Lives w/ wife. 1-story house/ trailer, walk-in shower, firearms stored safely. Has a cane that he uses 'occassionally' while outside on the concrete.  Seat Belt Safety/Bike Helmet: Wears seat belt.   Counseling:   Eye Exam- Follows w/ Dr. Bing Plume.   Dental- Follows w/ Dr. Rachael Darby in [facility] twice yearly.   Male:   CCS- Aged out.     PSA-  Lab Results  Component Value Date   PSA 0.75 10/06/2013   PSA 1.21 09/15/2010       Objective:    Vitals: BP 134/64   Pulse 65   Ht 5\' 8"  (1.727 m)   Wt 168 lb 12.8 oz (76.6 kg)   SpO2 98%   BMI 25.67 kg/m   Body mass index is 25.67 kg/m.  Wt Readings from Last 3 Encounters:  03/19/17 168 lb 12.8 oz (76.6 kg)  12/26/16 166 lb 9.6 oz (75.6 kg)  06/29/16 169 lb 6 oz (76.8 kg)      Tobacco History  Smoking Status  . Former Smoker  Smokeless Tobacco  . Never Used    Comment: quit 1980     Counseling given: Not Answered   Past Medical History:  Diagnosis Date  . Adenomatous colon polyp   . BPH (benign prostatic hyperplasia) 07/14/2013  . CAD (coronary artery disease)   . Carotid stenosis, bilateral    right 60-79% stenosed.  Left 40-59%  . Chronic kidney disease    chronic, stage II  . CRI (chronic renal insufficiency)   . Diverticulosis   . Dysphagia   . Esophageal stricture    Dr [name]  . Glaucoma   . Headache(784.0) 12/01/2013  . Hyperlipidemia   . Hypertension   . Insomnia 05/12/2015  . Low back pain syndrome 04/03/2014  . Medicare annual wellness visit, subsequent 02/04/2015  . Memory loss   . Postural lightheadedness   . PVD (peripheral vascular disease) ([place])   . Renal insufficiency   . Sensorineural hearing loss, bilateral   . Unspecified constipation 12/29/2012   Past Surgical History:  Procedure Laterality Date  . ABDOMINAL AORTIC ANEURYSM REPAIR  1996   Dr [name]  . CORONARY ARTERY BYPASS GRAFT    . EYE SURGERY     cataracts  b/l and laser  . HERNIA REPAIR  03/23/05   left inguinal Lichtenstein repair, with mesh.  [name]  . RENAL ARTERY STENT  1996   Dr [name].  Bilateral stenting   Family History  Problem Relation Age of Onset  . Heart disease Father        CAD  . Coronary artery disease Father   . Stroke Father   . Hearing loss Father   . Hearing loss Paternal Grandfather   . Heart disease Son   . Heart disease Brother        MI  . Cancer Neg Hx   . Diabetes Neg Hx    History  Sexual Activity  . Sexual activity: Yes    Comment: lives with wife, retired, no dietary restriction    Outpatient Encounter Prescriptions as of 03/19/2017  Medication Sig  . acetaminophen (TYLENOL) 500 MG tablet Take 2 tablets (1,000 mg total) by mouth 2 (two) times daily.  [name] Kitchen amLODipine (NORVASC) 5 MG tablet Take 1 tablet (5 mg  total) by mouth 2 (two) times daily.  [name] Kitchen aspirin EC 81 MG EC tablet Take 81 mg by mouth at bedtime.   [name] Kitchen atorvastatin (LIPITOR) 40 MG tablet TAKE 1 TABLET EVERY DAY  . finasteride (PROSCAR) 5 MG tablet TAKE 1 TABLET EVERY DAY  . losartan (COZAAR) 50 MG tablet TAKE 1 TABLET TWICE DAILY  . LUMIGAN 0.01 % SOLN   . metoprolol succinate (TOPROL-XL) 25 MG 24 hr tablet TAKE 1 TABLET EVERY DAY WITH SUPPER  . pantoprazole (PROTONIX) 40 MG tablet TAKE 1 TABLET EVERY DAY  . timolol (TIMOPTIC) 0.5 % ophthalmic solution Place 1 drop into both eyes 1 day or 1 dose.   No facility-administered encounter medications on file as of 03/19/2017.     Activities of Daily Living In your present state of health, do you have any difficulty performing the following activities: 03/19/2017  Hearing? Y  Vision? N  Difficulty concentrating or making decisions? N  Walking or climbing stairs? N  Dressing or bathing? N  Doing errands, shopping? N  Preparing Food and eating ? N  Using the Toilet? N  Managing your Medications? N  Managing your Finances? N  Housekeeping or managing your Housekeeping? N  Some recent data might be hidden    Patient Care Team: [name], MD as PCP - General (Family Medicine) Calvert Cantor, MD as Consulting Physician (Ophthalmology)   Assessment:    Physical assessment deferred to PCP.  Exercise Activities and Dietary recommendations Current Exercise Habits: The patient does not participate in regular exercise at present, Exercise limited by: orthopedic condition(s) (chronic back pain)  Diet (meal preparation, eat out, water intake, caffeinated beverages, dairy products, fruits and vegetables): on average, 2 meals per day and midday snack. Good appetite. Eats lots of chicken, beans. Regular diet. Wife prepares meals. Drinks 'very little' water. Breakfast: [place] Lunch: fruit and cookies Dinner: varies  Goals    . Increase water intake      Fall Risk Fall Risk   03/19/2017 02/01/2016 01/25/2015 10/10/2013  Falls in the past year? No No No No   Depression Screen PHQ 2/9 Scores 03/19/2017 02/01/2016 01/25/2015 10/10/2013  PHQ - 2 Score 0 0 0 0    Cognitive Function MMSE - Mini Mental State Exam 03/19/2017  Orientation to time 5  Orientation to Place 5  Registration 3  Attention/ Calculation 4  Recall 2  Language- name 2 objects 2  Language- repeat 1  Language- follow 3 step command 3  Language- read & follow direction 1  Write a sentence 1  Copy design 1  Total score 28        Immunization History  Administered Date(s) Administered  . Influenza Whole 08/17/2008, 09/20/2009, 08/23/2010  . Influenza, High Dose Seasonal PF 07/29/2015, 06/29/2016  . Influenza,inj,Quad PF,36+ Mos 07/11/2013, 08/13/2014  . Pneumococcal Conjugate-13 08/13/2014  . Pneumococcal Polysaccharide-23 11/20/2006, 09/30/2012  . Zoster 09/22/2010   Screening Tests Health Maintenance  Topic Date Due  . TETANUS/TDAP  03/21/1947  . INFLUENZA VACCINE  06/06/2017  . PNA vac Low Risk Adult  Completed      Plan:    Follow-up w/ PCP as scheduled.  Start using cane for balance stabilization.   Continue rest and elevation as directed for LEE. Follow-up w/ PCP if worsening or alarm symptoms (redness, pain, etc). Pt notified of instructions and verbalized understanding.  I have personally reviewed and noted the following in the patient's chart:   . Medical and social history . Use of alcohol, tobacco or illicit drugs  . Current medications and supplements . Functional ability and status . Nutritional status . Physical activity . Advanced directives . List of other physicians . Vitals . Screenings to include cognitive, depression, and falls . Referrals and appointments  In addition, I have reviewed and discussed with patient certain preventive protocols, quality metrics, and best practice recommendations. A written personalized care plan for preventive services as  well as general preventive health recommendations were provided to patient.     Dorrene German, RN  03/19/2017

## 2017-03-19 ENCOUNTER — Encounter: Payer: Self-pay | Admitting: *Deleted

## 2017-03-19 ENCOUNTER — Ambulatory Visit (INDEPENDENT_AMBULATORY_CARE_PROVIDER_SITE_OTHER): Payer: Medicare HMO | Admitting: *Deleted

## 2017-03-19 VITALS — BP 134/64 | HR 65 | Ht 68.0 in | Wt 168.8 lb

## 2017-03-19 DIAGNOSIS — Z0000 Encounter for general adult medical examination without abnormal findings: Secondary | ICD-10-CM

## 2017-03-19 NOTE — Patient Instructions (Addendum)
Mr. Krol , Thank you for taking time to come for your Medicare Wellness Visit. I appreciate your ongoing commitment to your health goals. Please review the following plan we discussed and let me know if I can assist you in the future.   These are the goals we discussed: Goals    . Increase water intake    . Prevent Falls       This is a list of the screening recommended for you and due dates:  Health Maintenance  Topic Date Due  . Tetanus Vaccine  03/21/1947  . Flu Shot  06/06/2017  . Pneumonia vaccines  Completed    Fall Prevention in the Home Falls can cause injuries. They can happen to people of all ages. There are many things you can do to make your home safe and to help prevent falls. What can I do on the outside of my home?  Regularly fix the edges of walkways and driveways and fix any cracks.  Remove anything that might make you trip as you walk through a door, such as a raised step or threshold.  Trim any bushes or trees on the path to your home.  Use bright outdoor lighting.  Clear any walking paths of anything that might make someone trip, such as rocks or tools.  Regularly check to see if handrails are loose or broken. Make sure that both sides of any steps have handrails.  Any raised decks and porches should have guardrails on the edges.  Have any leaves, snow, or ice cleared regularly.  Use sand or salt on walking paths during winter.  Clean up any spills in your garage right away. This includes oil or grease spills. What can I do in the bathroom?  Use night lights.  Install grab bars by the toilet and in the tub and shower. Do not use towel bars as grab bars.  Use non-skid mats or decals in the tub or shower.  If you need to sit down in the shower, use a plastic, non-slip stool.  Keep the floor dry. Clean up any water that spills on the floor as soon as it happens.  Remove soap buildup in the tub or shower regularly.  Attach bath mats securely  with double-sided non-slip rug tape.  Do not have throw rugs and other things on the floor that can make you trip. What can I do in the bedroom?  Use night lights.  Make sure that you have a light by your bed that is easy to reach.  Do not use any sheets or blankets that are too big for your bed. They should not hang down onto the floor.  Have a firm chair that has side arms. You can use this for support while you get dressed.  Do not have throw rugs and other things on the floor that can make you trip. What can I do in the kitchen?  Clean up any spills right away.  Avoid walking on wet floors.  Keep items that you use a lot in easy-to-reach places.  If you need to reach something above you, use a strong step stool that has a grab bar.  Keep electrical cords out of the way.  Do not use floor polish or wax that makes floors slippery. If you must use wax, use non-skid floor wax.  Do not have throw rugs and other things on the floor that can make you trip. What can I do with my stairs?  Do not leave  any items on the stairs.  Make sure that there are handrails on both sides of the stairs and use them. Fix handrails that are broken or loose. Make sure that handrails are as long as the stairways.  Check any carpeting to make sure that it is firmly attached to the stairs. Fix any carpet that is loose or worn.  Avoid having throw rugs at the top or bottom of the stairs. If you do have throw rugs, attach them to the floor with carpet tape.  Make sure that you have a light switch at the top of the stairs and the bottom of the stairs. If you do not have them, ask someone to add them for you. What else can I do to help prevent falls?  Wear shoes that:  Do not have high heels.  Have rubber bottoms.  Are comfortable and fit you well.  Are closed at the toe. Do not wear sandals.  If you use a stepladder:  Make sure that it is fully opened. Do not climb a closed  stepladder.  Make sure that both sides of the stepladder are locked into place.  Ask someone to hold it for you, if possible.  Clearly mark and make sure that you can see:  Any grab bars or handrails.  First and last steps.  Where the edge of each step is.  Use tools that help you move around (mobility aids) if they are needed. These include:  Canes.  Walkers.  Scooters.  Crutches.  Turn on the lights when you go into a dark area. Replace any light bulbs as soon as they burn out.  Set up your furniture so you have a clear path. Avoid moving your furniture around.  If any of your floors are uneven, fix them.  If there are any pets around you, be aware of where they are.  Review your medicines with your doctor. Some medicines can make you feel dizzy. This can increase your chance of falling. Ask your doctor what other things that you can do to help prevent falls. This information is not intended to replace advice given to you by your health care provider. Make sure you discuss any questions you have with your health care provider. Document Released: 08/19/2009 Document Revised: 03/30/2016 Document Reviewed: 11/27/2014 Elsevier Interactive Patient Education  2017 [place] Maintenance, Male A healthy lifestyle and preventive care is important for your health and wellness. Ask your health care provider about what schedule of regular examinations is right for you. What should I know about weight and diet?  Eat a Healthy Diet  Eat plenty of vegetables, fruits, whole grains, low-fat dairy products, and lean protein.  Do not eat a lot of foods high in solid fats, added sugars, or salt. Maintain a Healthy Weight  Regular exercise can help you achieve or maintain a healthy weight. You should:  Do at least 150 minutes of exercise each week. The exercise should increase your heart rate and make you sweat (moderate-intensity exercise).  Do strength-training  exercises at least twice a week. Watch Your Levels of Cholesterol and Blood Lipids  Have your blood tested for lipids and cholesterol every 5 years starting at 81 years of age. If you are at high risk for heart disease, you should start having your blood tested when you are 81 years old. You may need to have your cholesterol levels checked more often if:  Your lipid or cholesterol levels are high.  You are  older than 81 years of age.  You are at high risk for heart disease. What should I know about cancer screening? Many types of cancers can be detected early and may often be prevented. Lung Cancer  You should be screened every year for lung cancer if:  You are a current smoker who has smoked for at least 30 years.  You are a former smoker who has quit within the past 15 years.  Talk to your health care provider about your screening options, when you should start screening, and how often you should be screened. Colorectal Cancer  Routine colorectal cancer screening usually begins at 81 years of age and should be repeated every 5-10 years until you are 81 years old. You may need to be screened more often if early forms of precancerous polyps or small growths are found. Your health care provider may recommend screening at an earlier age if you have risk factors for colon cancer.  Your health care provider may recommend using home test kits to check for hidden blood in the stool.  A small camera at the end of a tube can be used to examine your colon (sigmoidoscopy or colonoscopy). This checks for the earliest forms of colorectal cancer. Prostate and Testicular Cancer  Depending on your age and overall health, your health care provider may do certain tests to screen for prostate and testicular cancer.  Talk to your health care provider about any symptoms or concerns you have about testicular or prostate cancer. Skin Cancer  Check your skin from head to toe regularly.  Tell your  health care provider about any new moles or changes in moles, especially if:  There is a change in a mole's size, shape, or color.  You have a mole that is larger than a pencil eraser.  Always use sunscreen. Apply sunscreen liberally and repeat throughout the day.  Protect yourself by wearing long sleeves, pants, a wide-brimmed hat, and sunglasses when outside. What should I know about heart disease, diabetes, and high blood pressure?  If you are 25-42 years of age, have your blood pressure checked every 3-5 years. If you are 81 years of age or older, have your blood pressure checked every year. You should have your blood pressure measured twice-once when you are at a hospital or clinic, and once when you are not at a hospital or clinic. Record the average of the two measurements. To check your blood pressure when you are not at a hospital or clinic, you can use:  An automated blood pressure machine at a pharmacy.  A home blood pressure monitor.  Talk to your health care provider about your target blood pressure.  If you are between 76-46 years old, ask your health care provider if you should take aspirin to prevent heart disease.  Have regular diabetes screenings by checking your fasting blood sugar level.  If you are at a normal weight and have a low risk for diabetes, have this test once every three years after the age of 10.  If you are overweight and have a high risk for diabetes, consider being tested at a younger age or more often.  A one-time screening for abdominal aortic aneurysm (AAA) by ultrasound is recommended for men aged 58-75 years who are current or former smokers. What should I know about preventing infection? Hepatitis B  If you have a higher risk for hepatitis B, you should be screened for this virus. Talk with your health care provider to find  out if you are at risk for hepatitis B infection. Hepatitis C  Blood testing is recommended for:  Everyone born from  [birth year] through 1963.  Anyone with known risk factors for hepatitis C. Sexually Transmitted Diseases (STDs)  You should be screened each year for STDs including gonorrhea and chlamydia if:  You are sexually active and are younger than 81 years of age.  You are older than 81 years of age and your health care provider tells you that you are at risk for this type of infection.  Your sexual activity has changed since you were last screened and you are at an increased risk for chlamydia or gonorrhea. Ask your health care provider if you are at risk.  Talk with your health care provider about whether you are at high risk of being infected with HIV. Your health care provider may recommend a prescription medicine to help prevent HIV infection. What else can I do?  Schedule regular health, dental, and eye exams.  Stay current with your vaccines (immunizations).  Do not use any tobacco products, such as cigarettes, chewing tobacco, and e-cigarettes. If you need help quitting, ask your health care provider.  Limit alcohol intake to no more than 2 drinks per day. One drink equals 12 ounces of beer, 5 ounces of wine, or 1 ounces of hard liquor.  Do not use street drugs.  Do not share needles.  Ask your health care provider for help if you need support or information about quitting drugs.  Tell your health care provider if you often feel depressed.  Tell your health care provider if you have ever been abused or do not feel safe at home. This information is not intended to replace advice given to you by your health care provider. Make sure you discuss any questions you have with your health care provider. Document Released: 04/20/2008 Document Revised: 06/21/2016 Document Reviewed: 07/27/2015 Elsevier Interactive Patient Education  2017 [employer].  [employer] Information Description of Services Cost  A Matter of Balance Class locations vary. Call [place] on Aging for more information.  http://dawson-may.com/ [phone] 8-Session program addressing the fear of falling and increasing activity levels of older adults Free to minimal cost  A.C.T. By [employer] [address], [place], [place] [address].  BetaBlues.dk [phone]  Personal training, gym, classes including Silver Sneakers* and ACTion for Aging Adults Fee-based  A.H.O.Y. (Add Health to [place]) Airs on Time [employer] 13, M-F at [place]: [employer],  [place] [facility] [place],  [place], [place], [address] [facility], [place], [place], [place] [address]  [place] Location: Sharrell Ku. [employer] [place]      [phone]  [phone]  [phone]  [phone]  [phone]  [phone]  [phone]  [phone]  [phone]  [phone]    [phone] A total-body conditioning class for adults 12 and older; designed to increase muscular strength, endurance, range of movement, flexibility, balance, agility and coordination Free  [facility] [place], [place] [address] [place]      [address]      [phone]      Pilate's class for individualsreturning to exercise after an injury, before or after surgery or for individuals with complex musculoskeletal issues; designed to improve strength, balance , flexibility      $15/class  [place] [address] [place], [place] [address] www.[email or web address].dk [place] classes for  beginners to advanced [place], [place] [address] [email or web address] [place] Chair Exercises Free, ages 24 and older; Ages 59-59 fee based  Marvia Pickles, [facility] [address] [place], [place] [address] [email or web address] .Beverlee Nims [phone]  A.H.O.Y. Tai Chi Fee-based Donation based or free  [place] Class locations vary.  Call or email Angela Burke or view website for more information. [email or web address] GainPain.com.[email or web address] [phone] Ongoing classes at local [employer]MC[employer] and gyms Fee-based  Silver Sneakers A.C.T. By [place] [employer]: [place] [phone]  [phone] Classes designed for older adults who want to improve their strength, flexibility, balance and endurance.   Silver sneakers is covered by some insurance plans and includes a fitness center membership at participating locations. Find out more by calling [phone] or visiting [email or web address] Covered by some insurance plans  [facility] [place] [phone] A.H.O.Y., fitness room, personal training, fitness classes for injury prevention, strength, balance, flexibility, water fitness classes Ages 55+: $9 for 6 months; Ages 65-54: $93 for 6 months  Tai Chi for Everybody [facility] [address] [place],  [address] [email or web address]'[email or web address] .Patsi Sears [place] classes for beginners to  advanced; geared for seniors Donation Based      [employer] (Helpling Others Participate in Exercise     Loyal Gambler. Rosana Hoes, PhD, [place] [email or web address] [place]     [phone]     A comprehensive fitness program for adults.  The program paris senior-level undergraduates Kinesiology students with adults who desire to learn how to exercise safely.  Includes a structural exercise class focusing on functional fitnesss     $100/semester in fall and spring; $75 in summer (no trainers)    *Silver Sneakers is covered by some Personal assistant and includes a  Radio producer at participating locations.  Find out more by calling [phone] or visiting [email or web address]  For additional health and human services resources for senior adults, please contact SeniorLine at [phone] in [place] and [place] at [phone] in all other areas.

## 2017-04-12 ENCOUNTER — Telehealth: Payer: Self-pay | Admitting: Family Medicine

## 2017-04-12 ENCOUNTER — Emergency Department (HOSPITAL_BASED_OUTPATIENT_CLINIC_OR_DEPARTMENT_OTHER): Payer: Medicare HMO

## 2017-04-12 ENCOUNTER — Encounter (HOSPITAL_BASED_OUTPATIENT_CLINIC_OR_DEPARTMENT_OTHER): Payer: Self-pay

## 2017-04-12 ENCOUNTER — Emergency Department (HOSPITAL_BASED_OUTPATIENT_CLINIC_OR_DEPARTMENT_OTHER)
Admission: EM | Admit: 2017-04-12 | Discharge: 2017-04-12 | Disposition: A | Discharge status: Home / Self Care | Payer: Medicare HMO | Attending: Emergency Medicine | Admitting: Emergency Medicine

## 2017-04-12 DIAGNOSIS — N182 Chronic kidney disease, stage 2 (mild): Secondary | ICD-10-CM | POA: Insufficient documentation

## 2017-04-12 DIAGNOSIS — I129 Hypertensive chronic kidney disease with stage 1 through stage 4 chronic kidney disease, or unspecified chronic kidney disease: Secondary | ICD-10-CM | POA: Insufficient documentation

## 2017-04-12 DIAGNOSIS — Z87891 Personal history of nicotine dependence: Secondary | ICD-10-CM | POA: Insufficient documentation

## 2017-04-12 DIAGNOSIS — R269 Unspecified abnormalities of gait and mobility: Secondary | ICD-10-CM | POA: Insufficient documentation

## 2017-04-12 DIAGNOSIS — Z79899 Other long term (current) drug therapy: Secondary | ICD-10-CM | POA: Insufficient documentation

## 2017-04-12 DIAGNOSIS — I2510 Atherosclerotic heart disease of native coronary artery without angina pectoris: Secondary | ICD-10-CM | POA: Diagnosis not present

## 2017-04-12 DIAGNOSIS — Z7982 Long term (current) use of aspirin: Secondary | ICD-10-CM | POA: Diagnosis not present

## 2017-04-12 DIAGNOSIS — R2689 Other abnormalities of gait and mobility: Secondary | ICD-10-CM | POA: Diagnosis not present

## 2017-04-12 DIAGNOSIS — Z951 Presence of aortocoronary bypass graft: Secondary | ICD-10-CM | POA: Diagnosis not present

## 2017-04-12 LAB — COMPREHENSIVE METABOLIC PANEL
ALT: 20 U/L (ref 17–63)
AST: 26 U/L (ref 15–41)
Albumin: 3.9 g/dL (ref 3.5–5.0)
Alkaline Phosphatase: 58 U/L (ref 38–126)
Anion gap: 9 (ref 5–15)
BUN: 26 mg/dL — AB (ref 6–20)
CHLORIDE: 104 mmol/L (ref 101–111)
CO2: 24 mmol/L (ref 22–32)
CREATININE: 1.28 mg/dL — AB (ref 0.61–1.24)
Calcium: 9.3 mg/dL (ref 8.9–10.3)
GFR calc Af Amer: 56 mL/min — ABNORMAL LOW (ref >60)
GFR, EST NON AFRICAN AMERICAN: 49 mL/min — AB (ref >60)
Glucose, Bld: 117 mg/dL — ABNORMAL HIGH (ref 65–99)
Potassium: 4.4 mmol/L (ref 3.5–5.1)
Sodium: 137 mmol/L (ref 135–145)
Total Bilirubin: 0.7 mg/dL (ref 0.3–1.2)
Total Protein: 7.3 g/dL (ref 6.5–8.1)

## 2017-04-12 LAB — TROPONIN I

## 2017-04-12 LAB — CBC
HEMATOCRIT: 41.5 % (ref 39.0–52.0)
HEMOGLOBIN: 13.9 g/dL (ref 13.0–17.0)
MCH: 31 pg (ref 26.0–34.0)
MCHC: 33.5 g/dL (ref 30.0–36.0)
MCV: 92.4 fL (ref 78.0–100.0)
Platelets: 252 10*3/uL (ref 150–400)
RBC: 4.49 MIL/uL (ref 4.22–5.81)
RDW: 14.5 % (ref 11.5–15.5)
WBC: 12.4 10*3/uL — AB (ref 4.0–10.5)

## 2017-04-12 LAB — DIFFERENTIAL
BASOS ABS: 0 10*3/uL (ref 0.0–0.1)
BASOS PCT: 0 %
Eosinophils Absolute: 0.3 10*3/uL (ref 0.0–0.7)
Eosinophils Relative: 3 %
LYMPHS ABS: 2.8 10*3/uL (ref 0.7–4.0)
Lymphocytes Relative: 22 %
MONOS PCT: 8 %
Monocytes Absolute: 0.9 10*3/uL (ref 0.1–1.0)
NEUTROS ABS: 8.4 10*3/uL — AB (ref 1.7–7.7)
Neutrophils Relative %: 67 %

## 2017-04-12 LAB — URINALYSIS, ROUTINE W REFLEX MICROSCOPIC
Bilirubin Urine: NEGATIVE
Glucose, UA: NEGATIVE mg/dL
Hgb urine dipstick: NEGATIVE
KETONES UR: NEGATIVE mg/dL
LEUKOCYTES UA: NEGATIVE
Nitrite: NEGATIVE
PH: 6.5 (ref 5.0–8.0)
PROTEIN: NEGATIVE mg/dL
Specific Gravity, Urine: 1.012 (ref 1.005–1.030)

## 2017-04-12 LAB — ETHANOL: Alcohol, Ethyl (B): <5 mg/dL (ref <5)

## 2017-04-12 LAB — RAPID URINE DRUG SCREEN, HOSP PERFORMED
Amphetamines: NOT DETECTED
BARBITURATES: NOT DETECTED
Benzodiazepines: NOT DETECTED
Cocaine: NOT DETECTED
OPIATES: NOT DETECTED
TETRAHYDROCANNABINOL: NOT DETECTED

## 2017-04-12 LAB — APTT: APTT: 35 s (ref 24–36)

## 2017-04-12 LAB — PROTIME-INR
INR: 1.03
Prothrombin Time: 13.5 seconds (ref 11.4–15.2)

## 2017-04-12 NOTE — ED Notes (Signed)
verbal understanding from wife of d/cinstructions and need to use walker

## 2017-04-12 NOTE — ED Notes (Signed)
Patient transported to CT 

## 2017-04-12 NOTE — Telephone Encounter (Signed)
Patient Name: Troy Ray DOB: 12/18/1927 Initial Comment Patinet is having trouble walking and keeping his balance. Also feels dizzy occasionally. Nurse Assessment Nurse: Ronnald Ramp, RN, Miranda Date/Time (Eastern Time): 04/12/2017 10:26:06 AM Confirm and document reason for call. If symptomatic, describe symptoms. ---Caller states her husband seems "wobbly" when he walks. He is also c/o dizziness. He is having increased weakness on his left side. Does the patient have any new or worsening symptoms? ---Yes Will a triage be completed? ---Yes Related visit to physician within the last 2 weeks? ---No Does the PT have any chronic conditions? (i.e. diabetes, asthma, etc.) ---Yes List chronic conditions. ---HTN, GERD, Is this a behavioral health or substance abuse call? ---No Guidelines Guideline Title Affirmed Question Affirmed Notes Weakness (Generalized) and Fatigue [1] MODERATE weakness (i.e., interferes with work, school, normal activities) AND [2] cause unknown (Exceptions: weakness with acute minor illness, or weakness from poor fluid intake) Final Disposition User See Physician within 4 Hours (or PCP triage) Ronnald Ramp, RN, Miranda Comments No appt available with PCP or at primary office within recommended time frame. Referrals [facility] - ED Disagree/Comply: Comply

## 2017-04-12 NOTE — Discharge Instructions (Signed)
Please follow up with a neurologist as soon as possible.  Return for any new weakness in the legs, headaches, visual changes, loss of control of your bowel or bladder, changes in your mental status. Otherwise, follow-up with her primary care provider and neurologist. Please make sure to take safety precautions to help prevent falls.

## 2017-04-12 NOTE — ED Notes (Signed)
ED Provider at bedside. 

## 2017-04-12 NOTE — ED Triage Notes (Addendum)
Pt states he woke at 330am 3 days ago and had difficulty walking-"I go left"-pos dizziness "i've had dizzy spells longer than that"-denies injury/pain-NAD-slow gait

## 2017-04-12 NOTE — ED Provider Notes (Signed)
[facility] Provider Note   CSN: [id number] Arrival date & time: 04/12/17  1221     History   Chief Complaint Chief Complaint  Patient presents with  . Gait Problem    HPI Troy Ray is a 83 y.o. male who presents emergency Department with chief complaint of gait abnormality. History is taken by the patient and his wife. He has had progressive deconditioning per his wife for the past several months. Yesterday she has noticed that he has had a new abnormal gait and has had some difficulty maintaining his balance. He does not endorse for vertigo or ataxia. He has no other neurologic symptoms such as weakness, change in vision, no shuffling gait, or difficulty with movement. He had a workup for a stroke a few years ago that showed no acute abnormalities. He denies urinary incontinence, altered mental status. He denies back pain, saddle anesthesia or weakness  HPI  Past Medical History:  Diagnosis Date  . Adenomatous colon polyp   . BPH (benign prostatic hyperplasia) 07/14/2013  . CAD (coronary artery disease)   . Carotid stenosis, bilateral    right 60-79% stenosed.  Left 40-59%  . Chronic kidney disease    chronic, stage II  . CRI (chronic renal insufficiency)   . Diverticulosis   . Dysphagia   . Esophageal stricture    Dr [name]  . Glaucoma   . Headache(784.0) 12/01/2013  . Hyperlipidemia   . Hypertension   . Insomnia 05/12/2015  . Low back pain syndrome 04/03/2014  . Medicare annual wellness visit, subsequent 02/04/2015  . Memory loss   . Postural lightheadedness   . PVD (peripheral vascular disease) ([place])   . Renal insufficiency   . Sensorineural hearing loss, bilateral   . Unspecified constipation 12/29/2012    Patient Active Problem List   Diagnosis Date Noted  . Anorexia 12/12/2015  . Skin lesion of left arm 05/12/2015  . Gastroesophageal reflux disease without esophagitis 05/12/2015  . Insomnia 05/12/2015  . Medicare annual wellness visit,  subsequent 02/04/2015  . Glaucoma 01/25/2015  . Low back pain syndrome 04/03/2014  . Apnea 01/25/2014  . Headache(784.0) 12/01/2013  . Sinusitis 10/13/2013  . BPH (benign prostatic hyperplasia) 07/14/2013  . Constipation 12/29/2012  . LACK OF COORDINATION 11/17/2010  . Hyperglycemia 11/17/2010  . PERSONAL HISTORY OF COLONIC POLYPS 09/21/2009  . Carotid artery disease ([place]) 05/11/2009  . POSTURAL LIGHTHEADEDNESS 04/06/2009  . MEMORY LOSS 04/06/2009  . Hyperlipidemia, mixed 12/07/2008  . Essential hypertension 12/07/2008  . Coronary atherosclerosis 12/07/2008  . History of AAA (abdominal aortic aneurysm) repair 12/04/2008  . CHRONIC KIDNEY DISEASE STAGE II (MILD) 12/04/2008  . ESOPHAGEAL STRICTURE 05/29/2008  . OTHER DYSPHAGIA 05/29/2008    Past Surgical History:  Procedure Laterality Date  . ABDOMINAL AORTIC ANEURYSM REPAIR  1996   Dr [name]  . CORONARY ARTERY BYPASS GRAFT    . EYE SURGERY     cataracts b/l and laser  . HERNIA REPAIR  03/23/05   left inguinal Lichtenstein repair, with mesh.  [name]  . RENAL ARTERY STENT  1996   Dr [name].  Bilateral stenting       Home Medications    Prior to Admission medications   Medication Sig Start Date End Date Taking? Authorizing Provider  acetaminophen (TYLENOL) 500 MG tablet Take 2 tablets (1,000 mg total) by mouth 2 (two) times daily. 02/01/16   [name], MD  amLODipine (NORVASC) 5 MG tablet Take 1 tablet (5 mg total)  by mouth 2 (two) times daily. 01/01/17   [name], MD  aspirin EC 81 MG EC tablet Take 81 mg by mouth at bedtime.  02/21/11   Shawna Orleans, [name], DO  atorvastatin (LIPITOR) 40 MG tablet TAKE 1 TABLET EVERY DAY 03/08/17   [name], MD  finasteride (PROSCAR) 5 MG tablet TAKE 1 TABLET EVERY DAY 01/01/17   [name], MD  losartan (COZAAR) 50 MG tablet TAKE 1 TABLET TWICE DAILY 12/19/16   [name], MD  LUMIGAN 0.01 % SOLN  12/30/14   [provider]  metoprolol succinate  (TOPROL-XL) 25 MG 24 hr tablet TAKE 1 TABLET EVERY DAY WITH SUPPER 01/01/17   [name], MD  pantoprazole (PROTONIX) 40 MG tablet TAKE 1 TABLET EVERY DAY 01/01/17   [name], MD  timolol (TIMOPTIC) 0.5 % ophthalmic solution Place 1 drop into both eyes 1 day or 1 dose. 02/25/15   [provider]    Family History Family History  Problem Relation Age of Onset  . Heart disease Father        CAD  . Coronary artery disease Father   . Stroke Father   . Hearing loss Father   . Hearing loss Paternal Grandfather   . Heart disease Son   . Heart disease Brother        MI  . Cancer Neg Hx   . Diabetes Neg Hx     Social History Social History  Substance Use Topics  . Smoking status: Former Research scientist (life sciences)  . Smokeless tobacco: Never Used     Comment: quit 1980  . Alcohol use 0.6 oz/week    1 Glasses of wine per week     Allergies   Patient has no known allergies.   Review of Systems Review of Systems  Ten systems reviewed and are negative for acute change, except as noted in the HPI.   Physical Exam Updated Vital Signs BP 135/76   Pulse (!) 59   Temp 97.9 F (36.6 C) (Oral)   Resp 17   Wt 76.4 kg (168 lb 6 oz)   SpO2 98%   BMI 25.60 kg/m   Physical Exam  Constitutional: He is oriented to person, place, and time. He appears well-developed and well-nourished. No distress.  HENT:  Head: Normocephalic and atraumatic.  Eyes: Conjunctivae are normal. No scleral icterus.  Neck: Normal range of motion. Neck supple.  Cardiovascular: Normal rate, regular rhythm and normal heart sounds.   Pulmonary/Chest: Effort normal and breath sounds normal. No respiratory distress.  Abdominal: Soft. There is no tenderness.  Musculoskeletal: He exhibits no edema.  Neurological: He is alert and oriented to person, place, and time.  Speech is clear and goal oriented, follows commands Major Cranial nerves without deficit, no facial droop Normal strength in upper and lower  extremities bilaterally including dorsiflexion and plantar flexion, strong and equal grip strength Sensation normal to light and sharp touch Moves extremities without ataxia, coordination intact Normal finger to nose and rapid alternating movements Neg romberg, no pronator drift Normal heel-shin  Wide based stiff leg with toddling gait.  Mild imbalance without over ataxia Babinski's down going without clonus Hyporefflexia  Skin: Skin is warm and dry. He is not diaphoretic.  Psychiatric: His behavior is normal.  Nursing note and vitals reviewed.    ED Treatments / Results  Labs (all labs ordered are listed, but only abnormal results are displayed) Labs Reviewed  CBC - Abnormal; Notable for  the following:       Result Value   WBC 12.4 (*)    All other components within normal limits  DIFFERENTIAL - Abnormal; Notable for the following:    Neutro Abs 8.4 (*)    All other components within normal limits  COMPREHENSIVE METABOLIC PANEL - Abnormal; Notable for the following:    Glucose, Bld 117 (*)    BUN 26 (*)    Creatinine, Ser 1.28 (*)    GFR calc non Af Amer 49 (*)    GFR calc Af Amer 56 (*)    All other components within normal limits  ETHANOL  PROTIME-INR  APTT  TROPONIN I  RAPID URINE DRUG SCREEN, HOSP PERFORMED  URINALYSIS, ROUTINE W REFLEX MICROSCOPIC    EKG  EKG Interpretation  Date/Time:  Thursday April 12 2017 14:01:41 EDT Ventricular Rate:  63 PR Interval:    QRS Duration: 95 QT Interval:  420 QTC Calculation: 430 R Axis:   -2 Text Interpretation:  Sinus rhythm No significant change since last tracing Confirmed by Theotis Burrow [phone]) on 04/12/2017 2:12:02 PM Also confirmed by Theotis Burrow [phone]), editor Drema Pry [phone])  on 04/12/2017 2:49:27 PM       Radiology Ct Head Wo Contrast  Result Date: 04/12/2017 CLINICAL DATA:  Abnormal gait. Off balance for 3 days. Head pain when leaning over. EXAM: CT HEAD WITHOUT CONTRAST TECHNIQUE:  Contiguous axial images were obtained from the base of the skull through the vertex without intravenous contrast. COMPARISON:  09/27/2014 FINDINGS: Brain: No evidence of acute infarction, hemorrhage, hydrocephalus, extra-axial collection or mass lesion/mass effect. Mild for age low-density in the periventricular white matter from chronic small vessel ischemia. Mild to moderate for age atrophy. Vascular: Atherosclerotic calcification.  No hyperdense vessel. Skull: No acute or aggressive finding Sinuses/Orbits: Negative IMPRESSION: Senescent changes without acute superimposed finding. Electronically Signed   By: Monte Fantasia M.D.   On: 04/12/2017 14:06    Procedures Procedures (including critical care time)  Medications Ordered in ED Medications - No data to display   Initial Impression / Assessment and Plan / ED Course  I have reviewed the triage vital signs and the nursing notes.  Pertinent labs & imaging results that were available during my care of the patient were reviewed by me and considered in my medical decision making (see chart for details).     Patient's workup is without significant or mental abnormality. Seen in shared visit with Dr. Rex Kras, who agrees with the patient's workup. I discussed the case with Dr. Shon Hale neurologist. He feels that he should follow up with an outpatient neurologist as he has no other focal neurologic deficits. This does not appear to be an upper motor neuron issue as he is hyporeflexive without positive Babinski's. Patient is able to ambulate.  I discussed return precautions with the patient who appears safe for discharge at this time.  Final Clinical Impressions(s) / ED Diagnoses   Final diagnoses:  None    New Prescriptions New Prescriptions   No medications on file     Lemarcus, [place], PA-C 04/12/17 Duvall, Wenda Overland, MD 04/17/17 2031

## 2017-04-16 ENCOUNTER — Telehealth: Payer: Self-pay | Admitting: Family Medicine

## 2017-04-16 NOTE — Telephone Encounter (Signed)
[facility] in [facility] for difficulty walking. They suggested to pt to have pcp refer to neurology. Pt req dr Mallie Mussel [facility]. Please advise pt. [phone] pt.

## 2017-04-17 NOTE — Telephone Encounter (Signed)
Dr Ellene Route is a neurosurgeon so he is only approprite for significant back pain and if we are considering surgery. A surgeon would look at neurologic conditions that could cause disequilibrium and unsteady gait. Would use Dr Tomi Likens or one of the [facility] Neurologists unless he has a neurologist he has already seen or another preference.

## 2017-04-17 NOTE — Telephone Encounter (Signed)
Please advise   Pc 

## 2017-04-19 ENCOUNTER — Other Ambulatory Visit: Payer: Self-pay | Admitting: Family Medicine

## 2017-04-19 DIAGNOSIS — R42 Dizziness and giddiness: Secondary | ICD-10-CM

## 2017-04-19 DIAGNOSIS — R2681 Unsteadiness on feet: Secondary | ICD-10-CM

## 2017-04-19 NOTE — Telephone Encounter (Signed)
°  Relation to pt: self  Call back [phone]   Reason for call:  Patient states Dr. Tomi Likens will be fine and would like to proceed with referral, please advise

## 2017-04-19 NOTE — Telephone Encounter (Signed)
Referral done to DR. Tomi Likens

## 2017-05-08 ENCOUNTER — Encounter: Payer: Self-pay | Admitting: Neurology

## 2017-05-08 ENCOUNTER — Ambulatory Visit (INDEPENDENT_AMBULATORY_CARE_PROVIDER_SITE_OTHER): Payer: Medicare HMO | Admitting: Neurology

## 2017-05-08 VITALS — BP 162/81 | HR 65 | Resp 20 | Ht 68.0 in | Wt 167.0 lb

## 2017-05-08 DIAGNOSIS — M5416 Radiculopathy, lumbar region: Secondary | ICD-10-CM | POA: Diagnosis not present

## 2017-05-08 DIAGNOSIS — I779 Disorder of arteries and arterioles, unspecified: Secondary | ICD-10-CM

## 2017-05-08 DIAGNOSIS — M545 Low back pain, unspecified: Secondary | ICD-10-CM | POA: Insufficient documentation

## 2017-05-08 DIAGNOSIS — R269 Unspecified abnormalities of gait and mobility: Secondary | ICD-10-CM | POA: Diagnosis not present

## 2017-05-08 DIAGNOSIS — M5442 Lumbago with sciatica, left side: Secondary | ICD-10-CM

## 2017-05-08 DIAGNOSIS — I739 Peripheral vascular disease, unspecified: Secondary | ICD-10-CM

## 2017-05-08 NOTE — Progress Notes (Signed)
[facility]  PATIENT: Troy Ray DOB: 10/17/1928  REFERRING DOCTOR OR PCP:  Penni Homans SOURCE: patient, notes from Dr. Charlett Blake  _________________________________   HISTORICAL  CHIEF COMPLAINT:  Chief Complaint  Patient presents with  . Gait Disturbance    Troy Ray is here with his wife Troy Ray, and son Troy Ray, for eval of gait/balance.  Sts. a few weeks ago, he went to bed fine, woke up the next morning and was leaning to the left.  No falls.  He was seen at [facility] in [facility], where CT head r/o CVA. or other acute process./fim  . Back Pain    He c/o lbp, worse on left side, onset 2 yrs. ago.  Radiates down back of left leg, not quite to knee.  Sts. left leg is weaker than right./fim    HISTORY OF PRESENT ILLNESS:  I had the pleasure to see you patient, Troy Ray, at [facility] for neurologic consultation regarding his gait disturbance and back pain.  He has had mild difficulty with gait for a while but he worsened a lot last week and noted problems with left leg strength.   He went to the [facility].   A head CT showed mild atrophy and small vessel ischemic changes.    The last couple days he feels he is doing better.     He has left sided lower back pain, worse about the buttock.  At times pain goes down the left leg to the thigh but not the foot.    He has had LBP and left leg pain x 3 years.    PT and OTC medications.   He did not get any benefit from PT.     He has nocturia x 2-3 which is worse than last year.    He does not snore.      About 3 years ago, he was hospitalized x several days for a possible stroke when he had mild left leg weakness.   Also, of notm he had back pain then.  Vascular risks:   He has CAD and had CABG x 3 in 2000.   No arrhythmia.     Imaging reports and images were reviewed. The x-ray of the lumbar spine shows moderate facet hypertrophy at L5-S1 (S1 is transitional) and mild levoscoliosis.   CT scan of the  head showed mild to moderate atrophy and chronic microvascular ischemic change.  There were no acute findings.   There is no hydrocephalus.    No changes since a 2015 CT head.     MRA in 2011 showed 50% right ICA stenosis.   He has had more recent dopplers (06/03/2014) showing 40-59% stenosis on the left and 60-79% on the right   REVIEW OF SYSTEMS: Constitutional: No fevers, chills, sweats, or change in appetite Eyes: No visual changes, double vision, eye pain Ear, nose and throat: No hearing loss, ear pain, nasal congestion, sore throat Cardiovascular: No chest pain, palpitations.   He has CAD.   Pedal edema has worsened Respiratory: No shortness of breath at rest or with exertion.   No wheezes GastrointestinaI: No nausea, vomiting, diarrhea, abdominal pain, fecal incontinence Genitourinary: No dysuria, urinary retention or frequency.  No nocturia. Musculoskeletal: Acute on chronic back pain Integumentary: No rash, pruritus, skin lesions Neurological: as above Psychiatric: No depression at this time.  No anxiety Endocrine: No palpitations, diaphoresis, change in appetite, change in weigh or increased thirst Hematologic/Lymphatic: No anemia, purpura, petechiae. Allergic/Immunologic: No itchy/runny  eyes, nasal congestion, recent allergic reactions, rashes  ALLERGIES: No Known Allergies  HOME MEDICATIONS:  Current Outpatient Prescriptions:  .  acetaminophen (TYLENOL) 500 MG tablet, Take 2 tablets (1,000 mg total) by mouth 2 (two) times daily., Disp: 30 tablet, Rfl: 0 .  amLODipine (NORVASC) 5 MG tablet, Take 1 tablet (5 mg total) by mouth 2 (two) times daily., Disp: 60 tablet, Rfl: 0 .  aspirin EC 81 MG EC tablet, Take 81 mg by mouth at bedtime. , Disp: , Rfl:  .  atorvastatin (LIPITOR) 40 MG tablet, TAKE 1 TABLET EVERY DAY, Disp: 90 tablet, Rfl: 0 .  finasteride (PROSCAR) 5 MG tablet, TAKE 1 TABLET EVERY DAY, Disp: 90 tablet, Rfl: 2 .  losartan (COZAAR) 50 MG tablet, TAKE 1 TABLET  TWICE DAILY, Disp: 180 tablet, Rfl: 2 .  LUMIGAN 0.01 % SOLN, , Disp: , Rfl:  .  metoprolol succinate (TOPROL-XL) 25 MG 24 hr tablet, TAKE 1 TABLET EVERY DAY WITH SUPPER, Disp: 90 tablet, Rfl: 2 .  pantoprazole (PROTONIX) 40 MG tablet, TAKE 1 TABLET EVERY DAY, Disp: 90 tablet, Rfl: 2 .  timolol (TIMOPTIC) 0.5 % ophthalmic solution, Place 1 drop into both eyes 1 day or 1 dose., Disp: , Rfl:   PAST MEDICAL HISTORY: Past Medical History:  Diagnosis Date  . Adenomatous colon polyp   . BPH (benign prostatic hyperplasia) 07/14/2013  . CAD (coronary artery disease)   . Carotid stenosis, bilateral    right 60-79% stenosed.  Left 40-59%  . Chronic kidney disease    chronic, stage II  . CRI (chronic renal insufficiency)   . Diverticulosis   . Dysphagia   . Esophageal stricture    Dr [name]  . Glaucoma   . Headache(784.0) 12/01/2013  . Hyperlipidemia   . Hypertension   . Insomnia 05/12/2015  . Low back pain syndrome 04/03/2014  . Medicare annual wellness visit, subsequent 02/04/2015  . Memory loss   . Postural lightheadedness   . PVD (peripheral vascular disease) ([place])   . Renal insufficiency   . Sensorineural hearing loss, bilateral   . Unspecified constipation 12/29/2012    PAST SURGICAL HISTORY: Past Surgical History:  Procedure Laterality Date  . ABDOMINAL AORTIC ANEURYSM REPAIR  1996   Dr [name]  . CORONARY ARTERY BYPASS GRAFT    . EYE SURGERY     cataracts b/l and laser  . HERNIA REPAIR  03/23/05   left inguinal Lichtenstein repair, with mesh.  [name]  . RENAL ARTERY STENT  1996   Dr [name].  Bilateral stenting    FAMILY HISTORY: Family History  Problem Relation Age of Onset  . Heart disease Father        CAD  . Coronary artery disease Father   . Stroke Father   . Hearing loss Father   . Hearing loss Paternal Grandfather   . Heart disease Son   . Heart disease Brother        MI  . Cancer Neg Hx   . Diabetes Neg Hx     SOCIAL HISTORY:  Social History    Social History  . Marital status: Married    Spouse name: N/A  . Number of children: 3  . Years of education: N/A   Occupational History  . 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 Smoking status: Former Research scientist (life sciences)  . Smokeless tobacco: Never Used     Comment: quit 1980  . Alcohol use 0.6 oz/week  1 Glasses of wine per week  . Drug use: No  . Sexual activity: Yes     Comment: lives with wife, retired, no dietary restriction   Other Topics Concern  . Not on file   Social History Narrative  . No narrative on file     PHYSICAL EXAM  Vitals:   05/08/17 1119  BP: (!) 162/81  Pulse: 65  Resp: 20  Weight: 167 lb (75.8 kg)  Height: 5\' 8"  (1.727 m)    Body mass index is 25.39 kg/m.   General: The patient is well-developed and well-nourished and in no acute distress  Eyes:  Funduscopic exam shows normal optic discs and retinal vessels.  Neck: The neck is supple, right carotid bruit is noted.  The neck is nontender.  Cardiovascular: The heart has a regular rate and rhythm with a normal S1 and S2. There were no murmurs, gallops or rubs. Lungs are clear to auscultation.  Skin/ext: Extremities are without rash.   Some left arm bruising.   Has severe pedal edema.  Musculoskeletal:  Back is tender at the left >> right piriformis muscle.   Nontender over lumbar paraspinal muscles  Neurologic Exam  Mental status: The patient is alert and oriented x 3 at the time of the examination. The patient has apparent normal recent and remote memory, with an apparently normal attention span and concentration ability.   Speech is normal.  Cranial nerves: Extraocular movements are full. Pupils are equal, round, and reactive to light and accomodation.  Visual fields are full.  Facial symmetry is present. There is good facial sensation to soft touch bilaterally.Facial strength is normal.  Trapezius and sternocleidomastoid strength is normal. No dysarthria is  noted.  The tongue is midline, and the patient has symmetric elevation of the soft palate. No obvious hearing deficits are noted.  Motor:  Muscle bulk is normal.   Tone is normal. Strength is  5 / 5 in all 4 extremities.   Sensory: Sensory testing is intact to pinprick, soft touch and vibration sensation in arms.  Mild sensation to touch/vib left L5 relative to right  Coordination: Cerebellar testing reveals good finger-nose-finger and heel-to-shin bilaterally.  Gait and station: Station is normal.   Gait is very arthritic and wide basedl. He cannot tandem walk. Romberg is negative.   Reflexes: Deep tendon reflexes are symmetric and normal bilaterally oin arms and absent in legs.   Plantar responses are flexor.    DIAGNOSTIC DATA (LABS, IMAGING, TESTING) - I reviewed patient records, labs, notes, testing and imaging myself where available.  Lab Results  Component Value Date   WBC 12.4 (H) 04/12/2017   HGB 13.9 04/12/2017   HCT 41.5 04/12/2017   MCV 92.4 04/12/2017   PLT 252 04/12/2017      Component Value Date/Time   NA 137 04/12/2017 1356   K 4.4 04/12/2017 1356   CL 104 04/12/2017 1356   CO2 24 04/12/2017 1356   GLUCOSE 117 (H) 04/12/2017 1356   BUN 26 (H) 04/12/2017 1356   CREATININE 1.28 (H) 04/12/2017 1356   CREATININE 1.25 (H) 12/10/2015 1611   CALCIUM 9.3 04/12/2017 1356   PROT 7.3 04/12/2017 1356   ALBUMIN 3.9 04/12/2017 1356   AST 26 04/12/2017 1356   ALT 20 04/12/2017 1356   ALKPHOS 58 04/12/2017 1356   BILITOT 0.7 04/12/2017 1356   GFRNONAA 49 (L) 04/12/2017 1356   GFRNONAA 52 (L) 02/21/2011 1720   GFRAA 56 (L) 04/12/2017 1356   GFRAA >60  02/21/2011 1720   Lab Results  Component Value Date   CHOL 142 12/27/2016   HDL 37.40 (L) 12/27/2016   LDLCALC 77 12/27/2016   TRIG 135.0 12/27/2016   CHOLHDL 4 12/27/2016   Lab Results  Component Value Date   HGBA1C 6.0 12/27/2016   No results found for: VITAMINB12 Lab Results  Component Value Date   TSH 3.68  12/27/2016       ASSESSMENT AND PLAN  Gait disturbance - Plan: MR BRAIN WO CONTRAST  Lumbar radiculopathy - Plan: MR LUMBAR SPINE WO CONTRAST  Bilateral carotid artery disease (HCC) - Plan: MR BRAIN WO CONTRAST, US Carotid Bilateral  Left-sided low back pain with left-sided sciatica, unspecified chronicity - Plan: MR LUMBAR SPINE WO CONTRAST   In summary, Mr. Steppe is an 23 year old man with a long history of back pain, worse on the left, and left leg pain who had a more recent episode of gait disturbance associated with left leg weakness. Although the episode may be related to exacerbaation of his back issues I am concerned that he could've had a lacunar infarction, that did not show on the scan. He has known carotid artery disease with 60-79 percent stenosis of the right ICA several years ago. We need to recheck that carotid Doppler study and also check a noncontrasted MRI of the brain to determine if there are any acute findings. If so, I would consider adding Plavix. Additionally, he has had more back pain and left leg pain. He was tender over the piriformis muscle and I did a trigger point injection with 80 mg Depo-Medrol in 3 mL Marcaine. He tolerated the injection well. We will check an MRI of the lumbar spine to determine if he has significant foraminal, lateral recess or spinal stenosis. If present, we would need to consider injections or referral to neurosurgery.  He will return for a regular visit in 2-3 months but we will call after the results become available and bring him in sooner based on those results. They should also call if he has new or worsening neurologic symptoms.  Thank you for asking me to see Mr. Bergeson for neurologic consultation. Please let me know if I can be of further assistance with her or other patients in the future.   Annalysa Mohammad A. Felecia Shelling, MD, [facility] 11/11/1094, 04:54 AM Certified in Neurology, Clinical Neurophysiology, Sleep Medicine, Pain Medicine and  Neuroimaging  [facility] [address] [place], [place] [address] [phone]

## 2017-05-11 ENCOUNTER — Other Ambulatory Visit: Payer: Self-pay | Admitting: Family Medicine

## 2017-05-14 ENCOUNTER — Telehealth: Payer: Self-pay | Admitting: Neurology

## 2017-05-14 ENCOUNTER — Ambulatory Visit
Admission: RE | Admit: 2017-05-14 | Discharge: 2017-05-14 | Disposition: A | Discharge status: Home / Self Care | Payer: Medicare HMO | Source: Ambulatory Visit | Attending: Neurology | Admitting: Neurology

## 2017-05-14 DIAGNOSIS — I739 Peripheral vascular disease, unspecified: Principal | ICD-10-CM

## 2017-05-14 DIAGNOSIS — I779 Disorder of arteries and arterioles, unspecified: Secondary | ICD-10-CM

## 2017-05-14 DIAGNOSIS — I6523 Occlusion and stenosis of bilateral carotid arteries: Secondary | ICD-10-CM | POA: Diagnosis not present

## 2017-05-14 MED ORDER — ALPRAZOLAM 0.25 MG PO TABS: ORAL_TABLET | ORAL | 0 refills | Status: DC

## 2017-05-14 NOTE — Telephone Encounter (Signed)
Xanax rx. faxed to [facility] per request/fim

## 2017-05-14 NOTE — Telephone Encounter (Signed)
Patients wife called and requested some medication to help her husband get through his MRI this weekend. She states that he is claustrophobic and may need something to help his nerves. Please call and advise.

## 2017-05-14 NOTE — Telephone Encounter (Signed)
Rx. awaiting RAS sig/fim 

## 2017-05-15 ENCOUNTER — Telehealth: Payer: Self-pay | Admitting: *Deleted

## 2017-05-15 NOTE — Telephone Encounter (Signed)
I have spoken with Troy Ray and per RAS, reviewed carotid duplex results as  below.  She verbalized understanding of same/fim

## 2017-05-15 NOTE — Telephone Encounter (Signed)
-----   Message from Britt Bottom, MD sent at 05/14/2017  5:38 PM EDT ----- Please let them know that the carotid Dopplers did not show any worsening of the carotid disease.

## 2017-05-20 ENCOUNTER — Ambulatory Visit
Admission: RE | Admit: 2017-05-20 | Discharge: 2017-05-20 | Disposition: A | Discharge status: Home / Self Care | Payer: Medicare HMO | Source: Ambulatory Visit | Attending: Neurology | Admitting: Neurology

## 2017-05-20 DIAGNOSIS — I779 Disorder of arteries and arterioles, unspecified: Secondary | ICD-10-CM

## 2017-05-20 DIAGNOSIS — I6529 Occlusion and stenosis of unspecified carotid artery: Secondary | ICD-10-CM | POA: Diagnosis not present

## 2017-05-20 DIAGNOSIS — M5442 Lumbago with sciatica, left side: Secondary | ICD-10-CM | POA: Diagnosis not present

## 2017-05-20 DIAGNOSIS — I739 Peripheral vascular disease, unspecified: Secondary | ICD-10-CM

## 2017-05-20 DIAGNOSIS — M5126 Other intervertebral disc displacement, lumbar region: Secondary | ICD-10-CM | POA: Diagnosis not present

## 2017-05-20 DIAGNOSIS — R269 Unspecified abnormalities of gait and mobility: Secondary | ICD-10-CM

## 2017-05-20 DIAGNOSIS — M5416 Radiculopathy, lumbar region: Secondary | ICD-10-CM

## 2017-05-22 ENCOUNTER — Telehealth: Payer: Self-pay | Admitting: *Deleted

## 2017-05-22 DIAGNOSIS — H26492 Other secondary cataract, left eye: Secondary | ICD-10-CM | POA: Diagnosis not present

## 2017-05-22 DIAGNOSIS — H401132 Primary open-angle glaucoma, bilateral, moderate stage: Secondary | ICD-10-CM | POA: Diagnosis not present

## 2017-05-22 MED ORDER — METHYLPREDNISOLONE 4 MG PO TBPK: ORAL_TABLET | ORAL | 0 refills | Status: DC

## 2017-05-22 NOTE — Telephone Encounter (Signed)
I have spoken with Mrs. Kinney and per RAS, reviewed MRI results as below.  She verbalized understanding of same, sts. pt. is having "a lot of pain."  She is agreeable to Medrol dose pk.  Rx. escribed to [facility] per her request/fim

## 2017-05-22 NOTE — Telephone Encounter (Signed)
-----   Message from Britt Bottom, MD sent at 05/22/2017  9:01 AM EDT ----- Please let them know that the MRI of the brain showed moderate age related changes but nothing that looked brand-new. His type of changes can affect walking but should not be associated with sudden changes.  The MRI of the lumbar spine does show spinal stenosis at 2 levels due to arthritis and disc protrusions. The L4 and L5 nerve roots on the left do not have much room and that could lead to left-sided sciatica-type symptoms.   If his pain worsens, we could consider a steroid pack and if that does not help a referral for an epidural steroid.

## 2017-05-24 MED ORDER — METHYLPREDNISOLONE 4 MG PO TBPK: ORAL_TABLET | ORAL | 0 refills | Status: DC

## 2017-05-24 NOTE — Addendum Note (Signed)
Addended by: France Ravens I on: 05/24/2017 04:41 PM   Modules accepted: Orders

## 2017-05-24 NOTE — Telephone Encounter (Signed)
Rx. for Medrol dose pk. called to [place] at [facility].  I tried to call pt. to let him know but home # rang with no answer/fim

## 2017-05-24 NOTE — Telephone Encounter (Signed)
Pt wife calling to inform that she checked with the [facility] and they do not have the prescription, please resend

## 2017-05-29 ENCOUNTER — Other Ambulatory Visit: Payer: Self-pay | Admitting: Family Medicine

## 2017-05-29 DIAGNOSIS — I10 Essential (primary) hypertension: Secondary | ICD-10-CM

## 2017-05-29 NOTE — Telephone Encounter (Signed)
Phone rang without being answered.  If Mrs. Norlander calls back, please let her know that she can keep the extra Medrol dose pack in case pt. needs it later on.  He does not need to take it now./fim

## 2017-05-29 NOTE — Telephone Encounter (Signed)
Patients wife called office in reference to methylPREDNISolone (MEDROL DOSEPAK) 4 MG TBPK tablet.  Patients wife states she picked up the medication from [facility] but received another one from [facility].  She would like to know if she is to hold on to the extra prescription at a back up?  Please call

## 2017-05-31 ENCOUNTER — Other Ambulatory Visit: Payer: Self-pay | Admitting: Neurology

## 2017-06-06 ENCOUNTER — Telehealth: Payer: Self-pay | Admitting: Neurology

## 2017-06-06 DIAGNOSIS — M79605 Pain in left leg: Secondary | ICD-10-CM

## 2017-06-06 DIAGNOSIS — M5416 Radiculopathy, lumbar region: Secondary | ICD-10-CM

## 2017-06-06 NOTE — Telephone Encounter (Signed)
I have spoken with Troy Ray this afternoon.  She sts. pt. did not get relief with Medrol dose pk.  Per RAS, ok for left L5-S1 esi.  Order in EPIC/fim

## 2017-06-06 NOTE — Telephone Encounter (Signed)
Pt wife calling to inform that the steroid has done pt no good and would like a call back on what else could be recommended to help pt.

## 2017-06-06 NOTE — Addendum Note (Signed)
Addended by: France Ravens I on: 06/06/2017 03:51 PM   Modules accepted: Orders

## 2017-06-08 ENCOUNTER — Telehealth: Payer: Self-pay | Admitting: Neurology

## 2017-06-08 NOTE — Telephone Encounter (Signed)
I have spoken with Mrs. Magnan and explained esi to her/fim

## 2017-06-08 NOTE — Telephone Encounter (Signed)
Pt wife is asking for a call back from RN Faith about the name of the test that pt is having on Monday @ [facility] so that she can explain better to pt what will be done.  Please call

## 2017-06-18 ENCOUNTER — Other Ambulatory Visit (INDEPENDENT_AMBULATORY_CARE_PROVIDER_SITE_OTHER): Payer: Medicare HMO

## 2017-06-18 DIAGNOSIS — I10 Essential (primary) hypertension: Secondary | ICD-10-CM

## 2017-06-18 DIAGNOSIS — E782 Mixed hyperlipidemia: Secondary | ICD-10-CM

## 2017-06-18 DIAGNOSIS — R739 Hyperglycemia, unspecified: Secondary | ICD-10-CM | POA: Diagnosis not present

## 2017-06-18 LAB — LIPID PANEL
CHOLESTEROL: 129 mg/dL (ref 0–200)
HDL: 34.2 mg/dL — ABNORMAL LOW (ref >39.00)
LDL CALC: 65 mg/dL (ref 0–99)
NonHDL: 94.66
TRIGLYCERIDES: 150 mg/dL — AB (ref 0.0–149.0)
Total CHOL/HDL Ratio: 4
VLDL: 30 mg/dL (ref 0.0–40.0)

## 2017-06-18 LAB — COMPREHENSIVE METABOLIC PANEL
ALBUMIN: 4.1 g/dL (ref 3.5–5.2)
ALK PHOS: 53 U/L (ref 39–117)
ALT: 13 U/L (ref 0–53)
AST: 16 U/L (ref 0–37)
BILIRUBIN TOTAL: 0.5 mg/dL (ref 0.2–1.2)
BUN: 22 mg/dL (ref 6–23)
CO2: 26 mEq/L (ref 19–32)
Calcium: 9.8 mg/dL (ref 8.4–10.5)
Chloride: 103 mEq/L (ref 96–112)
Creatinine, Ser: 1.07 mg/dL (ref 0.40–1.50)
GFR: 69.44 mL/min (ref >60.00)
GLUCOSE: 107 mg/dL — AB (ref 70–99)
Potassium: 4.3 mEq/L (ref 3.5–5.1)
SODIUM: 137 meq/L (ref 135–145)
TOTAL PROTEIN: 7 g/dL (ref 6.0–8.3)

## 2017-06-18 LAB — CBC
HCT: 42.4 % (ref 39.0–52.0)
HEMOGLOBIN: 13.8 g/dL (ref 13.0–17.0)
MCHC: 32.7 g/dL (ref 30.0–36.0)
MCV: 94.9 fl (ref 78.0–100.0)
Platelets: 286 10*3/uL (ref 150.0–400.0)
RBC: 4.46 Mil/uL (ref 4.22–5.81)
RDW: 15.7 % — AB (ref 11.5–15.5)
WBC: 12 10*3/uL — AB (ref 4.0–10.5)

## 2017-06-18 LAB — HEMOGLOBIN A1C: HEMOGLOBIN A1C: 6.2 % (ref 4.6–6.5)

## 2017-06-18 LAB — TSH: TSH: 3.87 u[IU]/mL (ref 0.35–4.50)

## 2017-06-20 ENCOUNTER — Ambulatory Visit
Admission: RE | Admit: 2017-06-20 | Discharge: 2017-06-20 | Disposition: A | Discharge status: Home / Self Care | Payer: Medicare HMO | Source: Ambulatory Visit | Attending: Neurology | Admitting: Neurology

## 2017-06-20 DIAGNOSIS — M5416 Radiculopathy, lumbar region: Secondary | ICD-10-CM

## 2017-06-20 DIAGNOSIS — M545 Low back pain: Secondary | ICD-10-CM | POA: Diagnosis not present

## 2017-06-20 DIAGNOSIS — M79605 Pain in left leg: Secondary | ICD-10-CM

## 2017-06-20 MED ORDER — METHYLPREDNISOLONE ACETATE 40 MG/ML INJ SUSP (RADIOLOG
120.0000 mg | Freq: Once | INTRAMUSCULAR | Status: AC
  Administered 2017-06-20: 120 mg via EPIDURAL

## 2017-06-20 MED ORDER — IOPAMIDOL (ISOVUE-M 200) INJECTION 41%
1.0000 mL | Freq: Once | INTRAMUSCULAR | Status: AC
  Administered 2017-06-20: 1 mL via EPIDURAL

## 2017-06-20 NOTE — Discharge Instructions (Signed)

## 2017-06-25 ENCOUNTER — Ambulatory Visit (INDEPENDENT_AMBULATORY_CARE_PROVIDER_SITE_OTHER): Payer: Medicare HMO | Admitting: Family Medicine

## 2017-06-25 ENCOUNTER — Encounter: Payer: Self-pay | Admitting: Family Medicine

## 2017-06-25 VITALS — BP 148/62 | HR 58 | Temp 98.3°F | Resp 18 | Ht 67.0 in | Wt 166.4 lb

## 2017-06-25 DIAGNOSIS — E782 Mixed hyperlipidemia: Secondary | ICD-10-CM

## 2017-06-25 DIAGNOSIS — R739 Hyperglycemia, unspecified: Secondary | ICD-10-CM | POA: Diagnosis not present

## 2017-06-25 DIAGNOSIS — L578 Other skin changes due to chronic exposure to nonionizing radiation: Secondary | ICD-10-CM

## 2017-06-25 DIAGNOSIS — Z0000 Encounter for general adult medical examination without abnormal findings: Secondary | ICD-10-CM | POA: Diagnosis not present

## 2017-06-25 NOTE — Patient Instructions (Addendum)
Tylenol/Acetaminophen 500 mg tabs, max of 6 tablets in 24 hours  Shingrix is the new shingles shot, 2 shots over 6 months.   Preventive Care 81 Years and Older, Male Preventive care refers to lifestyle choices and visits with your health care provider that can promote health and wellness. What does preventive care include?  A yearly physical exam. This is also called an annual well check.  Dental exams once or twice a year.  Routine eye exams. Ask your health care provider how often you should have your eyes checked.  Personal lifestyle choices, including: ? Daily care of your teeth and gums. ? Regular physical activity. ? Eating a healthy diet. ? Avoiding tobacco and drug use. ? Limiting alcohol use. ? Practicing safe sex. ? Taking low doses of aspirin every day. ? Taking vitamin and mineral supplements as recommended by your health care provider. What happens during an annual well check? The services and screenings done by your health care provider during your annual well check will depend on your age, overall health, lifestyle risk factors, and family history of disease. Counseling Your health care provider may ask you questions about your:  Alcohol use.  Tobacco use.  Drug use.  Emotional well-being.  Home and relationship well-being.  Sexual activity.  Eating habits.  History of falls.  Memory and ability to understand (cognition).  Work and work Statistician.  Screening You may have the following tests or measurements:  Height, weight, and BMI.  Blood pressure.  Lipid and cholesterol levels. These may be checked every 5 years, or more frequently if you are over 74 years old.  Skin check.  Lung cancer screening. You may have this screening every year starting at age 41 if you have a 30-pack-year history of smoking and currently smoke or have quit within the past 15 years.  Fecal occult blood test (FOBT) of the stool. You may have this test every year  starting at age 62.  Flexible sigmoidoscopy or colonoscopy. You may have a sigmoidoscopy every 5 years or a colonoscopy every 10 years starting at age 57.  Prostate cancer screening. Recommendations will vary depending on your family history and other risks.  Hepatitis C blood test.  Hepatitis B blood test.  Sexually transmitted disease (STD) testing.  Diabetes screening. This is done by checking your blood sugar (glucose) after you have not eaten for a while (fasting). You may have this done every 1-3 years.  Abdominal aortic aneurysm (AAA) screening. You may need this if you are a current or former smoker.  Osteoporosis. You may be screened starting at age 44 if you are at high risk.  Talk with your health care provider about your test results, treatment options, and if necessary, the need for more tests. Vaccines Your health care provider may recommend certain vaccines, such as:  Influenza vaccine. This is recommended every year.  Tetanus, diphtheria, and acellular pertussis (Tdap, Td) vaccine. You may need a Td booster every 10 years.  Varicella vaccine. You may need this if you have not been vaccinated.  Zoster vaccine. You may need this after age 73.  Measles, mumps, and rubella (MMR) vaccine. You may need at least one dose of MMR if you were born in 1957 or later. You may also need a second dose.  Pneumococcal 13-valent conjugate (PCV13) vaccine. One dose is recommended after age 48.  Pneumococcal polysaccharide (PPSV23) vaccine. One dose is recommended after age 45.  Meningococcal vaccine. You may need this if you have  certain conditions.  Hepatitis A vaccine. You may need this if you have certain conditions or if you travel or work in places where you may be exposed to hepatitis A.  Hepatitis B vaccine. You may need this if you have certain conditions or if you travel or work in places where you may be exposed to hepatitis B.  Haemophilus influenzae type b (Hib)  vaccine. You may need this if you have certain risk factors.  Talk to your health care provider about which screenings and vaccines you need and how often you need them. This information is not intended to replace advice given to you by your health care provider. Make sure you discuss any questions you have with your health care provider. Document Released: 11/19/2015 Document Revised: 07/12/2016 Document Reviewed: 08/24/2015 Elsevier Interactive Patient Education  2017 [employer].

## 2017-06-25 NOTE — Progress Notes (Signed)
Subjective:  I acted as a Education administrator for Dr. Charlett Blake. [place], [place]  Patient ID: Troy Ray, male    DOB: [birth year], 83 y.o.   MRN: [id number]  No chief complaint on file.   HPI  Patient is in today for an annual exam. Patient is here with his wife, she states he is still having back pain. Other than the back he feels well. No recent illness or acute hospitlaizations. Denies CP/palp/SOB/HA/congestion/fevers/GI or GU c/o. Taking meds as prescribed. He has been trying to eat well. Stays active most days. No significant difficulties with ADLs at home recently.   Patient Care Team: [name], MD as PCP - General (Family Medicine) Calvert Cantor, MD as Consulting Physician (Ophthalmology)   Past Medical History:  Diagnosis Date  . Adenomatous colon polyp   . BPH (benign prostatic hyperplasia) 07/14/2013  . CAD (coronary artery disease)   . Carotid stenosis, bilateral    right 60-79% stenosed.  Left 40-59%  . Chronic kidney disease    chronic, stage II  . CRI (chronic renal insufficiency)   . Diverticulosis   . Dysphagia   . Esophageal stricture    Dr [name]  . Glaucoma   . Headache(784.0) 12/01/2013  . Hyperlipidemia   . Hypertension   . Insomnia 05/12/2015  . Low back pain syndrome 04/03/2014  . Medicare annual wellness visit, subsequent 02/04/2015  . Memory loss   . Postural lightheadedness   . PVD (peripheral vascular disease) ([place])   . Renal insufficiency   . Sensorineural hearing loss, bilateral   . Unspecified constipation 12/29/2012    Past Surgical History:  Procedure Laterality Date  . ABDOMINAL AORTIC ANEURYSM REPAIR  1996   Dr [name]  . CORONARY ARTERY BYPASS GRAFT    . EYE SURGERY     cataracts b/l and laser  . HERNIA REPAIR  03/23/05   left inguinal Lichtenstein repair, with mesh.  [name]  . RENAL ARTERY STENT  1996   Dr [name].  Bilateral stenting    Family History  Problem Relation Age of Onset  . Heart disease Father        CAD  .  Coronary artery disease Father   . Stroke Father   . Hearing loss Father   . Hearing loss Paternal Grandfather   . Heart disease Son   . Heart disease Brother        MI  . Cancer Neg Hx   . Diabetes Neg Hx     Social History   Social History  . Marital status: Married    Spouse name: N/A  . Number of children: 3  . Years of education: N/A   Occupational History  . 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 Smoking status: Former Research scientist (life sciences)  . Smokeless tobacco: Never Used     Comment: quit 1980  . Alcohol use 0.6 oz/week    1 Glasses of wine per week  . Drug use: No  . Sexual activity: Yes     Comment: lives with wife, retired, no dietary restriction   Other Topics Concern  . Not on file   Social History Narrative  . No narrative on file    Outpatient Medications Prior to Visit  Medication Sig Dispense Refill  . acetaminophen (TYLENOL) 500 MG tablet Take 2 tablets (1,000 mg total) by mouth 2 (two) times daily. 30 tablet 0  . amLODipine (NORVASC) 5 MG tablet Take 1 tablet (5 mg  total) by mouth 2 (two) times daily. 180 tablet 0  . aspirin EC 81 MG EC tablet Take 81 mg by mouth at bedtime.     [name] Kitchen atorvastatin (LIPITOR) 40 MG tablet TAKE 1 TABLET EVERY DAY 90 tablet 0  . finasteride (PROSCAR) 5 MG tablet TAKE 1 TABLET EVERY DAY 90 tablet 2  . losartan (COZAAR) 50 MG tablet TAKE 1 TABLET TWICE DAILY 180 tablet 2  . LUMIGAN 0.01 % SOLN     . metoprolol succinate (TOPROL-XL) 25 MG 24 hr tablet TAKE 1 TABLET EVERY DAY WITH SUPPER 90 tablet 2  . pantoprazole (PROTONIX) 40 MG tablet TAKE 1 TABLET EVERY DAY 90 tablet 2  . timolol (TIMOPTIC) 0.5 % ophthalmic solution Place 1 drop into both eyes 1 day or 1 dose.    [name] Kitchen ALPRAZolam (XANAX) 0.25 MG tablet Take one to two tablets by mouth prior to MRI.  You  must not drive, drink alcohol, or operate dangerous equipment after taking this medication. 2 tablet 0  . methylPREDNISolone (MEDROL DOSEPAK) 4 MG TBPK tablet  Take 6 tablets on day 1, 5 tablets on day 2, 4 tablets on day 3, 3 tablets on day 4, 2 tablets on day 5, 1 tablet on day 6. 21 tablet 0   No facility-administered medications prior to visit.     No Known Allergies  Review of Systems  Constitutional: Negative for chills, fever and malaise/fatigue.  HENT: Negative for congestion and hearing loss.   Eyes: Negative for discharge.  Respiratory: Negative for cough, sputum production and shortness of breath.   Cardiovascular: Negative for chest pain, palpitations and leg swelling.  Gastrointestinal: Negative for abdominal pain, blood in stool, constipation, diarrhea, heartburn, nausea and vomiting.  Genitourinary: Negative for dysuria, frequency, hematuria and urgency.  Musculoskeletal: Negative for back pain, falls and myalgias.  Skin: Negative for rash.  Neurological: Negative for dizziness, sensory change, loss of consciousness, weakness and headaches.  Endo/Heme/Allergies: Negative for environmental allergies. Does not bruise/bleed easily.  Psychiatric/Behavioral: Negative for depression and suicidal ideas. The patient is not nervous/anxious and does not have insomnia.        Objective:    Physical Exam  Constitutional: He is oriented to person, place, and time. He appears well-developed and well-nourished. No distress.  HENT:  Head: Normocephalic and atraumatic.  Eyes: Conjunctivae are normal.  Neck: Neck supple. No thyromegaly present.  Cardiovascular: Normal rate, regular rhythm and normal heart sounds.   No murmur heard. Pulmonary/Chest: Effort normal and breath sounds normal. No respiratory distress. He has no wheezes.  Abdominal: Soft. Bowel sounds are normal. He exhibits no mass. There is no tenderness.  Musculoskeletal: He exhibits no edema.  Lymphadenopathy:    He has no cervical adenopathy.  Neurological: He is alert and oriented to person, place, and time.  Skin: Skin is warm and dry.  Psychiatric: He has a normal mood  and affect. His behavior is normal.    BP (!) 148/62 (BP Location: Left Arm, Patient Position: Sitting, Cuff Size: Normal)   Pulse (!) 58   Temp 98.3 F (36.8 C) (Oral)   Resp 18   Ht 5\' 7"  (1.702 m)   Wt 166 lb 6.4 oz (75.5 kg)   SpO2 98%   BMI 26.06 kg/m  Wt Readings from Last 3 Encounters:  06/25/17 166 lb 6.4 oz (75.5 kg)  05/08/17 167 lb (75.8 kg)  04/12/17 168 lb 6 oz (76.4 kg)   BP Readings from Last 3 Encounters:  06/25/17 [name] Kitchen)  148/62  06/20/17 (!) 183/89  05/08/17 (!) 162/81     Immunization History  Administered Date(s) Administered  . Influenza Whole 08/17/2008, 09/20/2009, 08/23/2010  . Influenza, High Dose Seasonal PF 07/29/2015, 06/29/2016  . Influenza,inj,Quad PF,6+ Mos 07/11/2013, 08/13/2014  . Pneumococcal Conjugate-13 08/13/2014  . Pneumococcal Polysaccharide-23 11/20/2006, 09/30/2012  . Zoster 09/22/2010    Health Maintenance  Topic Date Due  . TETANUS/TDAP  03/21/1949  . INFLUENZA VACCINE  06/06/2017  . PNA vac Low Risk Adult  Completed    Lab Results  Component Value Date   WBC 12.0 (H) 06/18/2017   HGB 13.8 06/18/2017   HCT 42.4 06/18/2017   PLT 286.0 06/18/2017   GLUCOSE 107 (H) 06/18/2017   CHOL 129 06/18/2017   TRIG 150.0 (H) 06/18/2017   HDL 34.20 (L) 06/18/2017   LDLCALC 65 06/18/2017   ALT 13 06/18/2017   AST 16 06/18/2017   NA 137 06/18/2017   K 4.3 06/18/2017   CL 103 06/18/2017   CREATININE 1.07 06/18/2017   BUN 22 06/18/2017   CO2 26 06/18/2017   TSH 3.87 06/18/2017   PSA 0.75 10/06/2013   INR 1.03 04/12/2017   HGBA1C 6.2 06/18/2017   MICROALBUR 0.87 10/06/2013    Lab Results  Component Value Date   TSH 3.87 06/18/2017   Lab Results  Component Value Date   WBC 12.0 (H) 06/18/2017   HGB 13.8 06/18/2017   HCT 42.4 06/18/2017   MCV 94.9 06/18/2017   PLT 286.0 06/18/2017   Lab Results  Component Value Date   NA 137 06/18/2017   K 4.3 06/18/2017   CO2 26 06/18/2017   GLUCOSE 107 (H) 06/18/2017   BUN 22  06/18/2017   CREATININE 1.07 06/18/2017   BILITOT 0.5 06/18/2017   ALKPHOS 53 06/18/2017   AST 16 06/18/2017   ALT 13 06/18/2017   PROT 7.0 06/18/2017   ALBUMIN 4.1 06/18/2017   CALCIUM 9.8 06/18/2017   ANIONGAP 9 04/12/2017   GFR 69.44 06/18/2017   Lab Results  Component Value Date   CHOL 129 06/18/2017   Lab Results  Component Value Date   HDL 34.20 (L) 06/18/2017   Lab Results  Component Value Date   LDLCALC 65 06/18/2017   Lab Results  Component Value Date   TRIG 150.0 (H) 06/18/2017   Lab Results  Component Value Date   CHOLHDL 4 06/18/2017   Lab Results  Component Value Date   HGBA1C 6.2 06/18/2017         Assessment & Plan:   Problem List Items Addressed This Visit    Hyperlipidemia, mixed    Tolerating statin, encouraged heart healthy diet, avoid trans fats, minimize simple carbs and saturated fats. Increase exercise as tolerated      Hyperglycemia    hgba1c acceptable, minimize simple carbs. Increase exercise as tolerated.       Preventative health care    Patient encouraged to maintain heart healthy diet, regular exercise, adequate sleep. Consider daily probiotics. Take medications as prescribed      Sun-damaged skin - Primary    Referred to dermatology for further consideration      Relevant Orders   Ambulatory referral to Dermatology      I have discontinued Mr. Telfair ALPRAZolam and methylPREDNISolone. I am also having him maintain his aspirin EC, LUMIGAN, timolol, acetaminophen, losartan, pantoprazole, metoprolol succinate, finasteride, atorvastatin, and amLODipine.  No orders of the defined types were placed in this encounter.  CMA served as Education administrator during this visit.  History, Physical and Plan performed by medical provider. Documentation and orders reviewed and attested to.    Penni Homans, MD

## 2017-06-26 ENCOUNTER — Telehealth: Payer: Self-pay | Admitting: Neurology

## 2017-06-26 NOTE — Telephone Encounter (Signed)
Patients wife is calling to update Dr. Felecia Shelling after LP being done last week.  It has helped some but still having pain. Please call if questions

## 2017-06-26 NOTE — Telephone Encounter (Signed)
Noted/fim 

## 2017-06-27 DIAGNOSIS — L578 Other skin changes due to chronic exposure to nonionizing radiation: Secondary | ICD-10-CM | POA: Insufficient documentation

## 2017-06-27 NOTE — Assessment & Plan Note (Signed)
Patient encouraged to maintain heart healthy diet, regular exercise, adequate sleep. Consider daily probiotics. Take medications as prescribed 

## 2017-06-27 NOTE — Assessment & Plan Note (Signed)
hgba1c acceptable, minimize simple carbs. Increase exercise as tolerated.  

## 2017-06-27 NOTE — Assessment & Plan Note (Signed)
Tolerating statin, encouraged heart healthy diet, avoid trans fats, minimize simple carbs and saturated fats. Increase exercise as tolerated 

## 2017-06-27 NOTE — Assessment & Plan Note (Signed)
Referred to dermatology for further consideration.  

## 2017-07-02 ENCOUNTER — Encounter: Payer: Self-pay | Admitting: Family Medicine

## 2017-08-01 ENCOUNTER — Other Ambulatory Visit: Payer: Self-pay | Admitting: Family Medicine

## 2017-08-01 DIAGNOSIS — I10 Essential (primary) hypertension: Secondary | ICD-10-CM

## 2017-08-07 DIAGNOSIS — D485 Neoplasm of uncertain behavior of skin: Secondary | ICD-10-CM | POA: Diagnosis not present

## 2017-08-07 DIAGNOSIS — L821 Other seborrheic keratosis: Secondary | ICD-10-CM | POA: Diagnosis not present

## 2017-08-07 DIAGNOSIS — D225 Melanocytic nevi of trunk: Secondary | ICD-10-CM | POA: Diagnosis not present

## 2017-08-07 DIAGNOSIS — B353 Tinea pedis: Secondary | ICD-10-CM | POA: Diagnosis not present

## 2017-08-08 ENCOUNTER — Other Ambulatory Visit: Payer: Self-pay | Admitting: Family Medicine

## 2017-08-09 ENCOUNTER — Ambulatory Visit: Payer: Medicare HMO | Admitting: Neurology

## 2017-08-09 ENCOUNTER — Encounter: Payer: Self-pay | Admitting: Neurology

## 2017-08-09 VITALS — BP 154/72 | HR 64 | Resp 18 | Ht 67.0 in | Wt 165.0 lb

## 2017-08-09 DIAGNOSIS — I6523 Occlusion and stenosis of bilateral carotid arteries: Secondary | ICD-10-CM | POA: Diagnosis not present

## 2017-08-09 DIAGNOSIS — M5442 Lumbago with sciatica, left side: Secondary | ICD-10-CM

## 2017-08-09 DIAGNOSIS — M5416 Radiculopathy, lumbar region: Secondary | ICD-10-CM

## 2017-08-09 MED ORDER — TRAMADOL HCL 50 MG PO TABS: ORAL_TABLET | ORAL | 2 refills | Status: DC

## 2017-08-09 NOTE — Progress Notes (Signed)
[facility]  PATIENT: Troy Ray DOB: [birth year]  REFERRING DOCTOR OR PCP:  Penni Homans SOURCE: patient, notes from Dr. Charlett Blake  _________________________________   HISTORICAL  CHIEF COMPLAINT:  Chief Complaint  Patient presents with  . Back Pain    Medrol dose pk. did not help back, left leg pain and weakness.  Left L5-S1 esi helped for about a wk.  Now sx. back to where they were before the esi/fim  . Left leg weakness    HISTORY OF PRESENT ILLNESS:  Update 08/09/2017: Troy Ray is a 83 year old man with gait disturbance and back pain. At her last visit, MRI of the lumbar spine and brain was performed. The lumbar spine shows mild to moderate spinal stenosis at L3-L4 and mild spinal stenosis at L4-L5. There is potential for compression of the left L4 and left L5 nerve root.   The MRI of the brain showed moderate cortical atrophy and moderate chronic microvascular ischemic change but no acute findings. There were some progression when compared to the 2011 MRI.  He underwent an epidural steroid injection at L5-S1 on 06/20/2017. Pain was significantly better for about a week and then returned. He believes the pain is about the same as it was before the epidural steroid injection.  Currently, he reports lower back and left leg pain.   The pain is intermittent and usually best when he is sitting and worse when he stands for a while or walks. He stoops forward with walking. If he stands long time while stooped he also has pain.      He feels the left leg is mildly weak.    This is worse if he walks longer distances. He denies numbness in the leg.  He also had a carotid Doppler on 05/14/2017. It showed moderate right ICA stenosis of 50-69% (was greater on a previous one).   He denies any stroke or TIA symptoms since her last visit.   ___________________________________________ From 05/08/2017:  I had the pleasure to see you patient, Troy Ray, at [facility] for neurologic consultation regarding his gait disturbance and back pain  He has had mild difficulty with gait for a while but he worsened a lot last week and noted problems with left leg strength.   He went to the [place].   A head CT showed mild atrophy and small vessel ischemic changes.    The last couple days he feels he is doing better.     He has left sided lower back pain, worse about the buttock.  At times pain goes down the left leg to the thigh but not the foot.    He has had LBP and left leg pain x 3 years.    PT and OTC medications.   He did not get any benefit from PT.     He has nocturia x 2-3 which is worse than last year.    He does not snore.      About 3 years ago, he was hospitalized x several days for a possible stroke when he had mild left leg weakness.   Also, of notm he had back pain then.  Vascular risks:   He has CAD and had CABG x 3 in 2000.   No arrhythmia.     Imaging reports and images were reviewed. The x-ray of the lumbar spine shows moderate facet hypertrophy at L5-S1 (S1 is transitional) and mild levoscoliosis.   CT scan of the head showed mild to  moderate atrophy and chronic microvascular ischemic change.  There were no acute findings.   There is no hydrocephalus.    No changes since a 2015 CT head.     MRA in 2011 showed 50% right ICA stenosis.   He has had more recent dopplers (06/03/2014) showing 40-59% stenosis on the left and 60-79% on the right   REVIEW OF SYSTEMS: Constitutional: No fevers, chills, sweats, or change in appetite Eyes: No visual changes, double vision, eye pain Ear, nose and throat: No hearing loss, ear pain, nasal congestion, sore throat Cardiovascular: No chest pain, palpitations.   He has CAD.   Pedal edema has worsened Respiratory: No shortness of breath at rest or with exertion.   No wheezes GastrointestinaI: No nausea, vomiting, diarrhea, abdominal pain, fecal incontinence Genitourinary: No dysuria, urinary  retention or frequency.  No nocturia. Musculoskeletal: Acute on chronic back pain Integumentary: No rash, pruritus, skin lesions Neurological: as above Psychiatric: No depression at this time.  No anxiety Endocrine: No palpitations, diaphoresis, change in appetite, change in weigh or increased thirst Hematologic/Lymphatic: No anemia, purpura, petechiae. Allergic/Immunologic: No itchy/runny eyes, nasal congestion, recent allergic reactions, rashes  ALLERGIES: No Known Allergies  HOME MEDICATIONS:  Current Outpatient Prescriptions:  .  acetaminophen (TYLENOL) 500 MG tablet, Take 2 tablets (1,000 mg total) by mouth 2 (two) times daily., Disp: 30 tablet, Rfl: 0 .  amLODipine (NORVASC) 5 MG tablet, TAKE 1 TABLET TWICE DAILY, Disp: 180 tablet, Rfl: 0 .  aspirin EC 81 MG EC tablet, Take 81 mg by mouth at bedtime. , Disp: , Rfl:  .  atorvastatin (LIPITOR) 40 MG tablet, TAKE 1 TABLET EVERY DAY, Disp: 90 tablet, Rfl: 0 .  finasteride (PROSCAR) 5 MG tablet, TAKE 1 TABLET EVERY DAY, Disp: 90 tablet, Rfl: 2 .  losartan (COZAAR) 50 MG tablet, TAKE 1 TABLET TWICE DAILY, Disp: 180 tablet, Rfl: 2 .  LUMIGAN 0.01 % SOLN, , Disp: , Rfl:  .  metoprolol succinate (TOPROL-XL) 25 MG 24 hr tablet, TAKE 1 TABLET EVERY DAY WITH SUPPER, Disp: 90 tablet, Rfl: 2 .  pantoprazole (PROTONIX) 40 MG tablet, TAKE 1 TABLET EVERY DAY, Disp: 90 tablet, Rfl: 2 .  timolol (TIMOPTIC) 0.5 % ophthalmic solution, Place 1 drop into both eyes 1 day or 1 dose., Disp: , Rfl:  .  traMADol (ULTRAM) 50 MG tablet, 1/2 to 1 po bid prn, Disp: 60 tablet, Rfl: 2  PAST MEDICAL HISTORY: Past Medical History:  Diagnosis Date  . Adenomatous colon polyp   . BPH (benign prostatic hyperplasia) 07/14/2013  . CAD (coronary artery disease)   . Carotid stenosis, bilateral    right 60-79% stenosed.  Left 40-59%  . Chronic kidney disease    chronic, stage II  . CRI (chronic renal insufficiency)   . Diverticulosis   . Dysphagia   . Esophageal  stricture    Dr [name]  . Glaucoma   . Headache(784.0) 12/01/2013  . Hyperlipidemia   . Hypertension   . Insomnia 05/12/2015  . Low back pain syndrome 04/03/2014  . Medicare annual wellness visit, subsequent 02/04/2015  . Memory loss   . Postural lightheadedness   . PVD (peripheral vascular disease) ([place])   . Renal insufficiency   . Sensorineural hearing loss, bilateral   . Unspecified constipation 12/29/2012    PAST SURGICAL HISTORY: Past Surgical History:  Procedure Laterality Date  . ABDOMINAL AORTIC ANEURYSM REPAIR  1996   Dr [name]  . CORONARY ARTERY BYPASS GRAFT    . EYE  SURGERY     cataracts b/l and laser  . HERNIA REPAIR  03/23/05   left inguinal Lichtenstein repair, with mesh.  [name]  . RENAL ARTERY STENT  1996   Dr [name].  Bilateral stenting    FAMILY HISTORY: Family History  Problem Relation Age of Onset  . Heart disease Father        CAD  . Coronary artery disease Father   . Stroke Father   . Hearing loss Father   . Hearing loss Paternal Grandfather   . Heart disease Son   . Heart disease Brother        MI  . Cancer Neg Hx   . Diabetes Neg Hx     SOCIAL HISTORY:  Social History   Social History  . Marital status: Married    Spouse name: N/A  . Number of children: 3  . Years of education: N/A   Occupational History  . 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 Smoking status: Former Research scientist (life sciences)  . Smokeless tobacco: Never Used     Comment: quit 1980  . Alcohol use 0.6 oz/week    1 Glasses of wine per week  . Drug use: No  . Sexual activity: Yes     Comment: lives with wife, retired, no dietary restriction   Other Topics Concern  . Not on file   Social History Narrative  . No narrative on file     PHYSICAL EXAM  Vitals:   08/09/17 1301  BP: (!) 154/72  Pulse: 64  Resp: 18  Weight: 165 lb (74.8 kg)  Height: 5\' 7"  (1.702 m)    Body mass index is 25.84 kg/m.   General: The patient is  well-developed and well-nourished and in no acute distress   Neck: The neck is supple, right carotid bruit is noted.  The neck is nontender.   Musculoskeletal:  Back is Mildly tender in the lower lumbar paraspinal muscles..  Reduced range of motion of the back.  Neurologic Exam  Mental status: The patient is alert and oriented x 3 at the time of the examination. The patient has apparent normal recent and remote memory, with an apparently normal attention span and concentration ability.   Speech is normal.  Cranial nerves: Extraocular movements are full. Facial strength and sensation is normal. Trapezius strength is normal.  The tongue is midline, and the patient has symmetric elevation of the soft palate. No obvious hearing deficits are noted.  Motor:  Muscle bulk is normal.   Tone is normal. Strength is  5 / 5 in all 4 extremities.   Sensory: Sensory testing is intact to soft touch and vibration sensation in arms.  Mild sensation to touch/vib left L5 relative to right   Gait and station: Station is normal.   Gait is very arthritic and wide based. He cannot tandem walk. Romberg is negative.   Reflexes: Deep tendon reflexes are symmetric and normal bilaterally in arms and absent in legs.       DIAGNOSTIC DATA (LABS, IMAGING, TESTING) - I reviewed patient records, labs, notes, testing and imaging myself where available.  Lab Results  Component Value Date   WBC 12.0 (H) 06/18/2017   HGB 13.8 06/18/2017   HCT 42.4 06/18/2017   MCV 94.9 06/18/2017   PLT 286.0 06/18/2017      Component Value Date/Time   NA 137 06/18/2017 1010   K 4.3 06/18/2017 1010   CL 103 06/18/2017 1010  CO2 26 06/18/2017 1010   GLUCOSE 107 (H) 06/18/2017 1010   BUN 22 06/18/2017 1010   CREATININE 1.07 06/18/2017 1010   CREATININE 1.25 (H) 12/10/2015 1611   CALCIUM 9.8 06/18/2017 1010   PROT 7.0 06/18/2017 1010   ALBUMIN 4.1 06/18/2017 1010   AST 16 06/18/2017 1010   ALT 13 06/18/2017 1010   ALKPHOS 53  06/18/2017 1010   BILITOT 0.5 06/18/2017 1010   GFRNONAA 49 (L) 04/12/2017 1356   GFRNONAA 52 (L) 02/21/2011 1720   GFRAA 56 (L) 04/12/2017 1356   GFRAA >60 02/21/2011 1720   Lab Results  Component Value Date   CHOL 129 06/18/2017   HDL 34.20 (L) 06/18/2017   LDLCALC 65 06/18/2017   TRIG 150.0 (H) 06/18/2017   CHOLHDL 4 06/18/2017   Lab Results  Component Value Date   HGBA1C 6.2 06/18/2017   No results found for: VITAMINB12 Lab Results  Component Value Date   TSH 3.87 06/18/2017       ASSESSMENT AND PLAN  Lumbar radiculopathy - Plan: Ambulatory request for epidural steroid injection  Left-sided low back pain with left-sided sciatica, unspecified chronicity - Plan: Ambulatory request for epidural steroid injection  Bilateral carotid artery stenosis    1.  Repeat ESI.    He is not interested in seeing neurosurgery due to his age of 82 years. He would reconsider this if symptoms worsen. 2.    Trial of tramadol for pain at low dose.   I'm reluctant to use gabapentin as he is already unsteady on his feet.    3.     Continue aspirin for carotid stenosis 4.    He will return for a regular visit in 4 months or call if he has new or worsening neurologic symptoms.      Richard A. Felecia Shelling, MD, [facility] 35/03/9740, 6:38 PM Certified in Neurology, Clinical Neurophysiology, Sleep Medicine, Pain Medicine and Neuroimaging  [facility] [address] [place], [place] [address] [phone]

## 2017-08-10 ENCOUNTER — Other Ambulatory Visit: Payer: Self-pay | Admitting: Emergency Medicine

## 2017-08-10 ENCOUNTER — Other Ambulatory Visit: Payer: Self-pay | Admitting: Neurology

## 2017-08-10 DIAGNOSIS — M5416 Radiculopathy, lumbar region: Secondary | ICD-10-CM

## 2017-08-10 MED ORDER — LOSARTAN POTASSIUM 50 MG PO TABS: 50.0000 mg | ORAL_TABLET | Freq: Two times a day (BID) | ORAL | 2 refills | Status: DC

## 2017-08-15 ENCOUNTER — Telehealth: Payer: Self-pay | Admitting: Family Medicine

## 2017-08-15 NOTE — Telephone Encounter (Signed)
Spoke with pt's spouse. She states PCP has been advising pt to keep feet elevated and drink more water. He saw dermatologist and recently treated for a fungus. Spouse thinks pt has had a reaction to medication pt was given to treat the fungus. States feet are both red, itching and swollen. Advised her that pt needs evaluation in the office and scheduled appt for 08/16/17 at 10:15am.

## 2017-08-15 NOTE — Telephone Encounter (Signed)
So I will not be in office after 10 am on Thursday they were supposed to be unscheduling my patients so I can to the ultrasound appointment but they must not have blocked the space. So I will not be able to see him. Have him stop the new med and he will need to be rescheduled

## 2017-08-15 NOTE — Telephone Encounter (Signed)
Pt wife called states unresolved swollen feet. She would like a referral to Dr Stanford Breed in cardiology for pt. Wife says he has seen him in the past and would like to see him again at the [place] location.

## 2017-08-16 ENCOUNTER — Ambulatory Visit (INDEPENDENT_AMBULATORY_CARE_PROVIDER_SITE_OTHER): Payer: Medicare HMO | Admitting: Family Medicine

## 2017-08-16 ENCOUNTER — Ambulatory Visit: Payer: Medicare HMO | Admitting: Family Medicine

## 2017-08-16 ENCOUNTER — Encounter: Payer: Self-pay | Admitting: Family Medicine

## 2017-08-16 VITALS — BP 142/92 | HR 64 | Temp 97.8°F | Ht 67.0 in | Wt 162.0 lb

## 2017-08-16 DIAGNOSIS — Z23 Encounter for immunization: Secondary | ICD-10-CM | POA: Diagnosis not present

## 2017-08-16 DIAGNOSIS — R0789 Other chest pain: Secondary | ICD-10-CM

## 2017-08-16 DIAGNOSIS — M7989 Other specified soft tissue disorders: Secondary | ICD-10-CM | POA: Diagnosis not present

## 2017-08-16 DIAGNOSIS — L244 Irritant contact dermatitis due to drugs in contact with skin: Secondary | ICD-10-CM | POA: Diagnosis not present

## 2017-08-16 MED ORDER — TRIAMCINOLONE ACETONIDE 0.1 % EX CREA: TOPICAL_CREAM | CUTANEOUS | 1 refills | Status: DC

## 2017-08-16 NOTE — Progress Notes (Signed)
Pre visit review using our clinic review tool, if applicable. No additional management support is needed unless otherwise documented below in the visit note. 

## 2017-08-16 NOTE — Progress Notes (Signed)
Chief Complaint  Patient presents with  . Medication Reaction    feet are swollen    Troy Ray is a 83 y.o. male here for a skin complaint.  Duration: 2 days Location: feet Pruritic? Yes Painful? No Drainage? No New soaps/lotions/topicals/detergents? Yes- topical ketoconazole from derm Sick contacts? No Other associated symptoms: none Therapies tried thus far: just tried steroid cream yesterday, no noticeable improvement yet  Swelling in feet- Ongoing issue, tx'd by reg PCP with elevation and minding salt intake, no help. He does not utilize compression. He has hx of MI, due for appt w cardiologist and plans to discuss this with him. No pain or injury, no calf pain.  Pain over R side of chest going on for 2-3 days also. No injury or change in activity. Worse when he moves his arm. Has been using heat over area.   ROS:  Const: No fevers Skin: As noted in HPI  Past Medical History:  Diagnosis Date  . Adenomatous colon polyp   . BPH (benign prostatic hyperplasia) 07/14/2013  . CAD (coronary artery disease)   . Carotid stenosis, bilateral    right 60-79% stenosed.  Left 40-59%  . Chronic kidney disease    chronic, stage II  . CRI (chronic renal insufficiency)   . Diverticulosis   . Dysphagia   . Esophageal stricture    Dr [name]  . Glaucoma   . Headache(784.0) 12/01/2013  . Hyperlipidemia   . Hypertension   . Insomnia 05/12/2015  . Low back pain syndrome 04/03/2014  . Medicare annual wellness visit, subsequent 02/04/2015  . Memory loss   . Postural lightheadedness   . PVD (peripheral vascular disease) ([place])   . Renal insufficiency   . Sensorineural hearing loss, bilateral   . Unspecified constipation 12/29/2012   No Known Allergies Allergies as of 08/16/2017   No Known Allergies     Medication List       Accurate as of 08/16/17 11:44 AM. Always use your most recent med list.          acetaminophen 500 MG tablet Commonly known as:  TYLENOL Take 2 tablets  (1,000 mg total) by mouth 2 (two) times daily.   amLODipine 5 MG tablet Commonly known as:  NORVASC TAKE 1 TABLET TWICE DAILY   aspirin EC 81 MG tablet Take 81 mg by mouth at bedtime.   atorvastatin 40 MG tablet Commonly known as:  LIPITOR TAKE 1 TABLET EVERY DAY   finasteride 5 MG tablet Commonly known as:  PROSCAR TAKE 1 TABLET EVERY DAY   losartan 50 MG tablet Commonly known as:  COZAAR Take 1 tablet (50 mg total) by mouth 2 (two) times daily.   LUMIGAN 0.01 % Soln Generic drug:  bimatoprost   metoprolol succinate 25 MG 24 hr tablet Commonly known as:  TOPROL-XL TAKE 1 TABLET EVERY DAY WITH SUPPER   pantoprazole 40 MG tablet Commonly known as:  PROTONIX TAKE 1 TABLET EVERY DAY   timolol 0.5 % ophthalmic solution Commonly known as:  TIMOPTIC Place 1 drop into both eyes 1 day or 1 dose.   traMADol 50 MG tablet Commonly known as:  ULTRAM 1/2 to 1 po bid prn   triamcinolone cream 0.1 % Commonly known as:  KENALOG Apply thin layer to affected area on feet twice daily for 10 days.       BP (!) 142/92 (BP Location: Right Arm, Patient Position: Sitting, Cuff Size: Normal)   Pulse 64   Temp 97.8  F (36.6 C) (Oral)   Ht 5\' 7"  (1.702 m)   Wt 162 lb (73.5 kg)   SpO2 99%   BMI 25.37 kg/m  Gen: awake, alert, appearing stated age Heart: RRR, +SEM heard loudest at aortic listening post, 2+ pitting edema b/l feet Lungs: No accessory muscle use MSK: +TTP over R pec and rib cage or ant axillary line Skin: see pics (4). No excessive warmth, TTP, fluctuance Psych: Age appropriate judgment and insight             Irritant contact dermatitis due to drug in contact with skin - Plan: triamcinolone cream (KENALOG) 0.1 %  Bilateral swelling of feet  Chest wall pain  Orders as above. BID for 10 days. Stop topical ketoconazole.  Offered compression stockings but she declined.  Tylenol, heat, ice, stretch for chest wall pain. Hx and exam suggestive of msk  etiology. F/u prn. The patient and wife voiced understanding and agreement to the plan.  [place], DO 08/16/17 11:44 AM

## 2017-08-16 NOTE — Patient Instructions (Signed)
Heat (pad or rice pillow in microwave) over affected area, 10-15 minutes every 2-3 hours while awake.   Ice/cold pack over area for 10-15 min every 2-3 hours while awake.  Try to stretch the area if you can.  Don't use the ketoconazole cream anymore.   Let us know if you need anything.

## 2017-08-17 NOTE — Telephone Encounter (Signed)
Pt seen by Dr Nani Ravens on 08/16/17.

## 2017-08-21 ENCOUNTER — Telehealth: Payer: Self-pay | Admitting: Family Medicine

## 2017-08-21 NOTE — Telephone Encounter (Signed)
Caller name: Louella Relation to pt: spouse Call back [phone] Pharmacy:  Reason for call: wife states pt saw Dr. Nani Ravens on 08/16/17, states pt explained to him about a pain that was in his chest below the rib cage, states Dr. Nani Ravens informed him that it was a pulled muscle and told him to use hot/cold paks and taking tylenol.  Pt states it is not doing any good and would like to know if Dr. Charlett Blake would provide him with something for pain. Pt states he only wants Dr. Charlett Blake to help him. Please advise

## 2017-08-23 ENCOUNTER — Other Ambulatory Visit: Payer: Self-pay

## 2017-08-23 MED ORDER — TRAMADOL HCL 50 MG PO TABS: ORAL_TABLET | ORAL | 2 refills | Status: DC

## 2017-08-23 NOTE — Telephone Encounter (Signed)
Please advise 

## 2017-08-23 NOTE — Telephone Encounter (Signed)
Spoke with patients spouse, she stated he does not currently have any tramadol. Per Dr. Charlett Blake as long as he has not been taking the tramadol it is ok to refill.   Medication refilled and faxed to [facility]

## 2017-08-23 NOTE — Telephone Encounter (Signed)
He has Tramadol is he taking that and it is not working? Is he also taking the 2 tylenol twice daily as recommended if yes and he still have pain then try lidocaine patches or gel to painful area. Can also add Celebrex 200 mg daily x 7 days and see if that helps. Can call that in if patient would like to try it.

## 2017-09-11 ENCOUNTER — Ambulatory Visit (HOSPITAL_BASED_OUTPATIENT_CLINIC_OR_DEPARTMENT_OTHER)
Admission: RE | Admit: 2017-09-11 | Discharge: 2017-09-11 | Disposition: A | Discharge status: Home / Self Care | Payer: Medicare HMO | Source: Ambulatory Visit | Attending: Family Medicine | Admitting: Family Medicine

## 2017-09-11 ENCOUNTER — Encounter: Payer: Self-pay | Admitting: Family Medicine

## 2017-09-11 ENCOUNTER — Ambulatory Visit: Payer: Medicare HMO | Admitting: Family Medicine

## 2017-09-11 VITALS — BP 146/60 | HR 52 | Temp 97.8°F | Resp 16 | Wt 161.2 lb

## 2017-09-11 DIAGNOSIS — K5900 Constipation, unspecified: Secondary | ICD-10-CM

## 2017-09-11 DIAGNOSIS — I10 Essential (primary) hypertension: Secondary | ICD-10-CM

## 2017-09-11 DIAGNOSIS — S22050A Wedge compression fracture of T5-T6 vertebra, initial encounter for closed fracture: Secondary | ICD-10-CM | POA: Insufficient documentation

## 2017-09-11 DIAGNOSIS — R109 Unspecified abdominal pain: Secondary | ICD-10-CM

## 2017-09-11 DIAGNOSIS — K449 Diaphragmatic hernia without obstruction or gangrene: Secondary | ICD-10-CM | POA: Diagnosis not present

## 2017-09-11 DIAGNOSIS — D72829 Elevated white blood cell count, unspecified: Secondary | ICD-10-CM | POA: Diagnosis not present

## 2017-09-11 DIAGNOSIS — M546 Pain in thoracic spine: Secondary | ICD-10-CM | POA: Diagnosis not present

## 2017-09-11 DIAGNOSIS — R739 Hyperglycemia, unspecified: Secondary | ICD-10-CM | POA: Diagnosis not present

## 2017-09-11 DIAGNOSIS — X58XXXA Exposure to other specified factors, initial encounter: Secondary | ICD-10-CM | POA: Diagnosis not present

## 2017-09-11 DIAGNOSIS — S22059A Unspecified fracture of T5-T6 vertebra, initial encounter for closed fracture: Secondary | ICD-10-CM

## 2017-09-11 DIAGNOSIS — Z951 Presence of aortocoronary bypass graft: Secondary | ICD-10-CM | POA: Diagnosis not present

## 2017-09-11 LAB — CBC WITH DIFFERENTIAL/PLATELET
Basophils Absolute: 0.1 10*3/uL (ref 0.0–0.1)
Basophils Relative: 0.5 % (ref 0.0–3.0)
EOS PCT: 3.7 % (ref 0.0–5.0)
Eosinophils Absolute: 0.5 10*3/uL (ref 0.0–0.7)
HEMATOCRIT: 40.8 % (ref 39.0–52.0)
Hemoglobin: 13.5 g/dL (ref 13.0–17.0)
LYMPHS ABS: 2.5 10*3/uL (ref 0.7–4.0)
LYMPHS PCT: 17.9 % (ref 12.0–46.0)
MCHC: 33.1 g/dL (ref 30.0–36.0)
MCV: 95.8 fl (ref 78.0–100.0)
MONOS PCT: 6.5 % (ref 3.0–12.0)
Monocytes Absolute: 0.9 10*3/uL (ref 0.1–1.0)
NEUTROS ABS: 9.8 10*3/uL — AB (ref 1.4–7.7)
NEUTROS PCT: 71.4 % (ref 43.0–77.0)
PLATELETS: 280 10*3/uL (ref 150.0–400.0)
RBC: 4.25 Mil/uL (ref 4.22–5.81)
RDW: 13.4 % (ref 11.5–15.5)
WBC: 13.7 10*3/uL — ABNORMAL HIGH (ref 4.0–10.5)

## 2017-09-11 LAB — COMPREHENSIVE METABOLIC PANEL
ALK PHOS: 59 U/L (ref 39–117)
ALT: 12 U/L (ref 0–53)
AST: 16 U/L (ref 0–37)
Albumin: 3.9 g/dL (ref 3.5–5.2)
BILIRUBIN TOTAL: 0.6 mg/dL (ref 0.2–1.2)
BUN: 23 mg/dL (ref 6–23)
CALCIUM: 9.7 mg/dL (ref 8.4–10.5)
CO2: 26 mEq/L (ref 19–32)
Chloride: 102 mEq/L (ref 96–112)
Creatinine, Ser: 1.36 mg/dL (ref 0.40–1.50)
GFR: 52.63 mL/min — AB (ref >60.00)
GLUCOSE: 107 mg/dL — AB (ref 70–99)
POTASSIUM: 4.1 meq/L (ref 3.5–5.1)
Sodium: 136 mEq/L (ref 135–145)
TOTAL PROTEIN: 7.4 g/dL (ref 6.0–8.3)

## 2017-09-11 MED ORDER — TIZANIDINE HCL 2 MG PO TABS: 2.0000 mg | ORAL_TABLET | Freq: Three times a day (TID) | ORAL | 1 refills | Status: DC | PRN

## 2017-09-11 NOTE — Progress Notes (Signed)
Subjective:  I acted as a Education administrator for Troy Ray. Troy Ray, Troy Ray     Patient ID: Troy Ray, male    DOB: Aug 23, 1928, 83 y.o.   MRN: [id number]  No chief complaint on file.   HPI  Patient is in today for office visit ongoing pain in mid back radiating around to abdomen bilateral. No recent change in bowels or bloody or tarry stools. No fevers and chills. Change in position makes pain worse. Denies CP/palp/SOB/HA/congestion/fevers/GI or GU c/o. Taking meds as prescribed.   Patient Care Team: [name], MD as PCP - General (Family Medicine) Calvert Cantor, MD as Consulting Physician (Ophthalmology)   Past Medical History:  Diagnosis Date  . Adenomatous colon polyp   . BPH (benign prostatic hyperplasia) 07/14/2013  . CAD (coronary artery disease)   . Carotid stenosis, bilateral    right 60-79% stenosed.  Left 40-59%  . Chronic kidney disease    chronic, stage II  . CRI (chronic renal insufficiency)   . Diverticulosis   . Dysphagia   . Esophageal stricture    Dr [name]  . Glaucoma   . Headache(784.0) 12/01/2013  . Hyperlipidemia   . Hypertension   . Insomnia 05/12/2015  . Low back pain syndrome 04/03/2014  . Medicare annual wellness visit, subsequent 02/04/2015  . Memory loss   . Postural lightheadedness   . PVD (peripheral vascular disease) ([place])   . Renal insufficiency   . Sensorineural hearing loss, bilateral   . Unspecified constipation 12/29/2012    Past Surgical History:  Procedure Laterality Date  . ABDOMINAL AORTIC ANEURYSM REPAIR  1996   Dr [name]  . CORONARY ARTERY BYPASS GRAFT    . EYE SURGERY     cataracts b/l and laser  . HERNIA REPAIR  03/23/05   left inguinal Lichtenstein repair, with mesh.  [name]  . RENAL ARTERY STENT  1996   Dr [name].  Bilateral stenting    Family History  Problem Relation Age of Onset  . Heart disease Father        CAD  . Coronary artery disease Father   . Stroke Father   . Hearing loss Father   . Hearing loss  Paternal Grandfather   . Heart disease Son   . Heart disease Brother        MI  . Cancer Neg Hx   . Diabetes Neg Hx     Social History   Socioeconomic History  . Marital status: Married    Spouse name: Not on file  . Number of children: 3  . Years of education: Not on file  . Highest education level: Not on file  Social Needs  . Financial resource strain: Not on file  . Food insecurity - worry: Not on file  . Food insecurity - inability: Not on file  . Transportation needs - medical: Not on file  . Transportation needs - non-medical: Not on file  Occupational History  . Occupation: MEDIA ASSISTANT    Employer: [employer]  Tobacco Use  . Smoking status: Former Research scientist (life sciences)  . Smokeless tobacco: Never Used  . Tobacco comment: quit 1980  Substance and Sexual Activity  . Alcohol use: Yes    Alcohol/week: 0.6 oz    Types: 1 Glasses of wine per week  . Drug use: No  . Sexual activity: Yes    Comment: lives with wife, retired, no dietary restriction  Other Topics Concern  . Not on file  Social History Narrative  .  Not on file    Outpatient Medications Prior to Visit  Medication Sig Dispense Refill  . acetaminophen (TYLENOL) 500 MG tablet Take 2 tablets (1,000 mg total) by mouth 2 (two) times daily. 30 tablet 0  . amLODipine (NORVASC) 5 MG tablet TAKE 1 TABLET TWICE DAILY 180 tablet 0  . aspirin EC 81 MG EC tablet Take 81 mg by mouth at bedtime.     [name] Kitchen atorvastatin (LIPITOR) 40 MG tablet TAKE 1 TABLET EVERY DAY 90 tablet 0  . finasteride (PROSCAR) 5 MG tablet TAKE 1 TABLET EVERY DAY 90 tablet 2  . losartan (COZAAR) 50 MG tablet Take 1 tablet (50 mg total) by mouth 2 (two) times daily. 180 tablet 2  . LUMIGAN 0.01 % SOLN     . metoprolol succinate (TOPROL-XL) 25 MG 24 hr tablet TAKE 1 TABLET EVERY DAY WITH SUPPER 90 tablet 2  . pantoprazole (PROTONIX) 40 MG tablet TAKE 1 TABLET EVERY DAY 90 tablet 2  . timolol (TIMOPTIC) 0.5 % ophthalmic solution Place 1 drop into both  eyes 1 day or 1 dose.    . traMADol (ULTRAM) 50 MG tablet 1/2 to 1 po bid prn 60 tablet 2  . triamcinolone cream (KENALOG) 0.1 % Apply thin layer to affected area on feet twice daily for 10 days. 30 g 1   No facility-administered medications prior to visit.     No Known Allergies  Review of Systems  Constitutional: Negative for fever and malaise/fatigue.  HENT: Negative for congestion.   Respiratory: Negative for cough and shortness of breath.   Cardiovascular: Negative for chest pain and palpitations.  Gastrointestinal: Negative for vomiting.  Musculoskeletal: Negative for back pain.  Skin: Negative for rash.  Neurological: Negative for loss of consciousness and headaches.       Objective:    Physical Exam  Constitutional: He is oriented to person, place, and time. He appears well-developed and well-nourished. No distress.  HENT:  Head: Normocephalic and atraumatic.  Eyes: Conjunctivae are normal.  Neck: Normal range of motion. No thyromegaly present.  Cardiovascular: Normal rate and regular rhythm.  Pulmonary/Chest: Effort normal and breath sounds normal. He has no wheezes.  Abdominal: Soft. Bowel sounds are normal. There is no tenderness.  Musculoskeletal: Normal range of motion. He exhibits no edema or deformity.  Neurological: He is alert and oriented to person, place, and time.  Skin: Skin is warm and dry. He is not diaphoretic.  Psychiatric: He has a normal mood and affect.    BP (!) 146/60 (BP Location: Right Arm, Patient Position: Sitting, Cuff Size: Small)   Pulse (!) 52   Temp 97.8 F (36.6 C) (Oral)   Resp 16   Wt 161 lb 3.2 oz (73.1 kg)   SpO2 96%   BMI 25.25 kg/m  Wt Readings from Last 3 Encounters:  09/11/17 161 lb 3.2 oz (73.1 kg)  08/16/17 162 lb (73.5 kg)  08/09/17 165 lb (74.8 kg)   BP Readings from Last 3 Encounters:  09/11/17 (!) 146/60  08/16/17 (!) 142/92  08/09/17 (!) 154/72     Immunization History  Administered Date(s) Administered   . Influenza Whole 08/17/2008, 09/20/2009, 08/23/2010  . Influenza, High Dose Seasonal PF 07/29/2015, 06/29/2016, 08/16/2017  . Influenza,inj,Quad PF,6+ Mos 07/11/2013, 08/13/2014  . Pneumococcal Conjugate-13 08/13/2014  . Pneumococcal Polysaccharide-23 11/20/2006, 09/30/2012  . Zoster 09/22/2010    Health Maintenance  Topic Date Due  . TETANUS/TDAP  03/21/1947  . INFLUENZA VACCINE  Completed  . PNA vac Low  Risk Adult  Completed    Lab Results  Component Value Date   WBC 13.7 (H) 09/11/2017   HGB 13.5 09/11/2017   HCT 40.8 09/11/2017   PLT 280.0 09/11/2017   GLUCOSE 107 (H) 09/11/2017   CHOL 129 06/18/2017   TRIG 150.0 (H) 06/18/2017   HDL 34.20 (L) 06/18/2017   LDLCALC 65 06/18/2017   ALT 12 09/11/2017   AST 16 09/11/2017   NA 136 09/11/2017   K 4.1 09/11/2017   CL 102 09/11/2017   CREATININE 1.36 09/11/2017   BUN 23 09/11/2017   CO2 26 09/11/2017   TSH 3.87 06/18/2017   PSA 0.75 10/06/2013   INR 1.03 04/12/2017   HGBA1C 6.2 06/18/2017   MICROALBUR 0.87 10/06/2013    Lab Results  Component Value Date   TSH 3.87 06/18/2017   Lab Results  Component Value Date   WBC 13.7 (H) 09/11/2017   HGB 13.5 09/11/2017   HCT 40.8 09/11/2017   MCV 95.8 09/11/2017   PLT 280.0 09/11/2017   Lab Results  Component Value Date   NA 136 09/11/2017   K 4.1 09/11/2017   CO2 26 09/11/2017   GLUCOSE 107 (H) 09/11/2017   BUN 23 09/11/2017   CREATININE 1.36 09/11/2017   BILITOT 0.6 09/11/2017   ALKPHOS 59 09/11/2017   AST 16 09/11/2017   ALT 12 09/11/2017   PROT 7.4 09/11/2017   ALBUMIN 3.9 09/11/2017   CALCIUM 9.7 09/11/2017   ANIONGAP 9 04/12/2017   GFR 52.63 (L) 09/11/2017   Lab Results  Component Value Date   CHOL 129 06/18/2017   Lab Results  Component Value Date   HDL 34.20 (L) 06/18/2017   Lab Results  Component Value Date   LDLCALC 65 06/18/2017   Lab Results  Component Value Date   TRIG 150.0 (H) 06/18/2017   Lab Results  Component Value Date    CHOLHDL 4 06/18/2017   Lab Results  Component Value Date   HGBA1C 6.2 06/18/2017         Assessment & Plan:   Problem List Items Addressed This Visit    Essential hypertension    Mild elevation with acute pain, no changes to meds. Encouraged heart healthy diet such as the DASH diet and exercise as tolerated.       Hyperglycemia    minimize simple carbs. Increase exercise as tolerated.       Constipation   Relevant Orders   CBC with Differential/Platelet (Completed)   Comprehensive metabolic panel (Completed)   DG Abd 2 Views (Completed)   US Abdomen Complete   T5 vertebral fracture ([place])    Xray confirms fracture and is consistent with his pain. Will refer for consideration of kyphoplasty if patient in agreement. For now try topical lidocaine and continue Tylenol tid.       Leukocytosis    Unclear etiology, afebrile in visit will proceed with CT scan of abdomen given abdominal pain recently if patient in agreement       Other Visit Diagnoses    Abdominal pain, unspecified abdominal location    -  Primary   Relevant Orders   CBC with Differential/Platelet (Completed)   Comprehensive metabolic panel (Completed)   DG Abd 2 Views (Completed)   US Abdomen Complete   Bilateral thoracic back pain, unspecified chronicity       Relevant Medications   tiZANidine (ZANAFLEX) 2 MG tablet   Other Relevant Orders   DG Thoracic Spine 2 View (Completed)  I am having Troy Ray start on tiZANidine. I am also having him maintain his aspirin EC, LUMIGAN, timolol, acetaminophen, pantoprazole, metoprolol succinate, finasteride, atorvastatin, amLODipine, losartan, triamcinolone cream, and traMADol.  Meds ordered this encounter  Medications  . tiZANidine (ZANAFLEX) 2 MG tablet    Sig: Take 1 tablet (2 mg total) every 8 (eight) hours as needed by mouth for muscle spasms.    Dispense:  30 tablet    Refill:  1    CMA served as scribe during this visit. History, Physical and  Plan performed by medical provider. Documentation and orders reviewed and attested to.  Penni Homans, MD

## 2017-09-11 NOTE — Patient Instructions (Signed)
Encouraged increased hydration and fiber in diet. Daily probiotics. If bowels not moving can use MOM 2 tbls po in 4 oz of warm prune juice by mouth every 2-3 days. If no results then repeat in 4 hours with  Dulcolax suppository pr, may repeat again in 4 more hours as needed. Seek care if symptoms worsen. Consider daily Miralax and/or Dulcolax if symptoms persist.  Constipation, Adult Constipation is when a person:  Poops (has a bowel movement) fewer times in a week than normal.  Has a hard time pooping.  Has poop that is dry, hard, or bigger than normal.  Follow these instructions at home: Eating and drinking   Eat foods that have a lot of fiber, such as: ? Fresh fruits and vegetables. ? Whole grains. ? Beans.  Eat less of foods that are high in fat, low in fiber, or overly processed, such as: ? [place] fries. ? Hamburgers. ? Cookies. ? Candy. ? Soda.  Drink enough fluid to keep your pee (urine) clear or pale yellow. General instructions  Exercise regularly or as told by your doctor.  Go to the restroom when you feel like you need to poop. Do not hold it in.  Take over-the-counter and prescription medicines only as told by your doctor. These include any fiber supplements.  Do pelvic floor retraining exercises, such as: ? Doing deep breathing while relaxing your lower belly (abdomen). ? Relaxing your pelvic floor while pooping.  Watch your condition for any changes.  Keep all follow-up visits as told by your doctor. This is important. Contact a doctor if:  You have pain that gets worse.  You have a fever.  You have not pooped for 4 days.  You throw up (vomit).  You are not hungry.  You lose weight.  You are bleeding from the anus.  You have thin, pencil-like poop (stool). Get help right away if:  You have a fever, and your symptoms suddenly get worse.  You leak poop or have blood in your poop.  Your belly feels hard or bigger than normal (is  bloated).  You have very bad belly pain.  You feel dizzy or you faint. This information is not intended to replace advice given to you by your health care provider. Make sure you discuss any questions you have with your health care provider. Document Released: 04/10/2008 Document Revised: 05/12/2016 Document Reviewed: 04/12/2016 Elsevier Interactive Patient Education  2017 [employer].

## 2017-09-12 DIAGNOSIS — D72829 Elevated white blood cell count, unspecified: Secondary | ICD-10-CM | POA: Insufficient documentation

## 2017-09-12 DIAGNOSIS — S22059A Unspecified fracture of T5-T6 vertebra, initial encounter for closed fracture: Secondary | ICD-10-CM | POA: Insufficient documentation

## 2017-09-12 NOTE — Assessment & Plan Note (Signed)
Unclear etiology, afebrile in visit will proceed with CT scan of abdomen given abdominal pain recently if patient in agreement

## 2017-09-12 NOTE — Assessment & Plan Note (Signed)
minimize simple carbs. Increase exercise as tolerated.  

## 2017-09-12 NOTE — Assessment & Plan Note (Signed)
Xray confirms fracture and is consistent with his pain. Will refer for consideration of kyphoplasty if patient in agreement. For now try topical lidocaine and continue Tylenol tid.

## 2017-09-12 NOTE — Assessment & Plan Note (Signed)
Mild elevation with acute pain, no changes to meds. Encouraged heart healthy diet such as the DASH diet and exercise as tolerated.

## 2017-09-13 ENCOUNTER — Other Ambulatory Visit: Payer: Self-pay | Admitting: Family Medicine

## 2017-09-13 DIAGNOSIS — S32059A Unspecified fracture of fifth lumbar vertebra, initial encounter for closed fracture: Secondary | ICD-10-CM

## 2017-09-13 DIAGNOSIS — D72829 Elevated white blood cell count, unspecified: Secondary | ICD-10-CM

## 2017-09-13 DIAGNOSIS — R109 Unspecified abdominal pain: Secondary | ICD-10-CM

## 2017-09-13 DIAGNOSIS — R112 Nausea with vomiting, unspecified: Secondary | ICD-10-CM

## 2017-09-14 ENCOUNTER — Ambulatory Visit (HOSPITAL_BASED_OUTPATIENT_CLINIC_OR_DEPARTMENT_OTHER): Payer: Medicare HMO

## 2017-09-14 ENCOUNTER — Ambulatory Visit (HOSPITAL_BASED_OUTPATIENT_CLINIC_OR_DEPARTMENT_OTHER)
Admission: RE | Admit: 2017-09-14 | Discharge: 2017-09-14 | Disposition: A | Discharge status: Home / Self Care | Payer: Medicare HMO | Source: Ambulatory Visit | Attending: Family Medicine | Admitting: Family Medicine

## 2017-09-14 ENCOUNTER — Encounter (HOSPITAL_BASED_OUTPATIENT_CLINIC_OR_DEPARTMENT_OTHER): Payer: Self-pay

## 2017-09-14 ENCOUNTER — Other Ambulatory Visit (INDEPENDENT_AMBULATORY_CARE_PROVIDER_SITE_OTHER): Payer: Medicare HMO

## 2017-09-14 DIAGNOSIS — K5730 Diverticulosis of large intestine without perforation or abscess without bleeding: Secondary | ICD-10-CM | POA: Diagnosis not present

## 2017-09-14 DIAGNOSIS — K449 Diaphragmatic hernia without obstruction or gangrene: Secondary | ICD-10-CM | POA: Insufficient documentation

## 2017-09-14 DIAGNOSIS — D72829 Elevated white blood cell count, unspecified: Secondary | ICD-10-CM | POA: Diagnosis not present

## 2017-09-14 DIAGNOSIS — I700 Atherosclerosis of aorta: Secondary | ICD-10-CM | POA: Insufficient documentation

## 2017-09-14 DIAGNOSIS — R112 Nausea with vomiting, unspecified: Secondary | ICD-10-CM | POA: Diagnosis not present

## 2017-09-14 DIAGNOSIS — Z95828 Presence of other vascular implants and grafts: Secondary | ICD-10-CM | POA: Insufficient documentation

## 2017-09-14 DIAGNOSIS — N281 Cyst of kidney, acquired: Secondary | ICD-10-CM | POA: Insufficient documentation

## 2017-09-14 DIAGNOSIS — R109 Unspecified abdominal pain: Secondary | ICD-10-CM | POA: Diagnosis not present

## 2017-09-14 DIAGNOSIS — R1110 Vomiting, unspecified: Secondary | ICD-10-CM | POA: Diagnosis not present

## 2017-09-14 LAB — CBC
HEMATOCRIT: 41.3 % (ref 38.5–50.0)
Hemoglobin: 13.7 g/dL (ref 13.2–17.1)
MCH: 30.4 pg (ref 27.0–33.0)
MCHC: 33.2 g/dL (ref 32.0–36.0)
MCV: 91.6 fL (ref 80.0–100.0)
MPV: 10.4 fL (ref 7.5–12.5)
Platelets: 258 10*3/uL (ref 140–400)
RBC: 4.51 10*6/uL (ref 4.20–5.80)
RDW: 12 % (ref 11.0–15.0)
WBC: 14.5 10*3/uL — ABNORMAL HIGH (ref 3.8–10.8)

## 2017-09-14 MED ORDER — IOPAMIDOL (ISOVUE-300) INJECTION 61%
100.0000 mL | Freq: Once | INTRAVENOUS | Status: AC | PRN
  Administered 2017-09-14: 80 mL via INTRAVENOUS

## 2017-09-17 ENCOUNTER — Telehealth (HOSPITAL_COMMUNITY): Payer: Self-pay

## 2017-09-17 ENCOUNTER — Other Ambulatory Visit: Payer: Self-pay | Admitting: Family Medicine

## 2017-09-17 ENCOUNTER — Telehealth: Payer: Self-pay | Admitting: Family Medicine

## 2017-09-17 DIAGNOSIS — M4844XS Fatigue fracture of vertebra, thoracic region, sequela of fracture: Secondary | ICD-10-CM

## 2017-09-17 NOTE — Telephone Encounter (Signed)
Pt spouse called for CT scan results from Friday 09/14/17. Spouse also states pt is still experiencing pain.

## 2017-09-17 NOTE — Telephone Encounter (Signed)
Left a message for Jen at [facility] that the pt would need an MRI Thoracic first before  kyphoplasty. If pt can't do MRI then a CT Thoracic. Told her to call if she has any questions.  AW

## 2017-09-18 ENCOUNTER — Other Ambulatory Visit: Payer: Self-pay | Admitting: Family Medicine

## 2017-09-18 ENCOUNTER — Telehealth: Payer: Self-pay | Admitting: Family Medicine

## 2017-09-18 DIAGNOSIS — D72829 Elevated white blood cell count, unspecified: Secondary | ICD-10-CM

## 2017-09-18 MED ORDER — ALPRAZOLAM 0.25 MG PO TABS: ORAL_TABLET | ORAL | 0 refills | Status: DC

## 2017-09-18 NOTE — Telephone Encounter (Signed)
So I wrote a note about this on 11/9 saying no new concerns please see and make sure patient knows. Also another note is floating around that his pain is likely his vertebral fracture I referred to interventional radiology but they will not see without MRI so I ordered that yesterday please let them know. His WBC is up still so need to repeat again in next week and if he develops a new symptom and/or a fever they should let us know

## 2017-09-18 NOTE — Telephone Encounter (Signed)
Please advise 

## 2017-09-18 NOTE — Telephone Encounter (Signed)
Pt's spouse York Cerise called in to request a call back. She said that they need the results for CAT Scan and also they were told that pt is having a MRI, they are not sure why?   Please advise.   Spouse says that they have questions and would like to speak with PCP directly if possible.

## 2017-09-19 ENCOUNTER — Other Ambulatory Visit (INDEPENDENT_AMBULATORY_CARE_PROVIDER_SITE_OTHER): Payer: Medicare HMO

## 2017-09-19 DIAGNOSIS — D72829 Elevated white blood cell count, unspecified: Secondary | ICD-10-CM

## 2017-09-19 LAB — CBC WITH DIFFERENTIAL/PLATELET
Basophils Absolute: 0.1 10*3/uL (ref 0.0–0.1)
Basophils Relative: 0.5 % (ref 0.0–3.0)
EOS ABS: 0.5 10*3/uL (ref 0.0–0.7)
Eosinophils Relative: 4.1 % (ref 0.0–5.0)
HCT: 41.2 % (ref 39.0–52.0)
HEMOGLOBIN: 13.5 g/dL (ref 13.0–17.0)
Lymphocytes Relative: 20.1 % (ref 12.0–46.0)
Lymphs Abs: 2.6 10*3/uL (ref 0.7–4.0)
MCHC: 32.7 g/dL (ref 30.0–36.0)
MCV: 96 fl (ref 78.0–100.0)
MONO ABS: 0.8 10*3/uL (ref 0.1–1.0)
Monocytes Relative: 6.3 % (ref 3.0–12.0)
NEUTROS PCT: 69 % (ref 43.0–77.0)
Neutro Abs: 8.8 10*3/uL — ABNORMAL HIGH (ref 1.4–7.7)
Platelets: 282 10*3/uL (ref 150.0–400.0)
RBC: 4.29 Mil/uL (ref 4.22–5.81)
RDW: 13.5 % (ref 11.5–15.5)
WBC: 12.7 10*3/uL — AB (ref 4.0–10.5)

## 2017-09-20 NOTE — Telephone Encounter (Signed)
Spoke with patients spouse

## 2017-09-22 ENCOUNTER — Ambulatory Visit (HOSPITAL_BASED_OUTPATIENT_CLINIC_OR_DEPARTMENT_OTHER)
Admission: RE | Admit: 2017-09-22 | Discharge: 2017-09-22 | Disposition: A | Discharge status: Home / Self Care | Payer: Medicare HMO | Source: Ambulatory Visit | Attending: Family Medicine | Admitting: Family Medicine

## 2017-09-22 DIAGNOSIS — R29890 Loss of height: Secondary | ICD-10-CM | POA: Insufficient documentation

## 2017-09-22 DIAGNOSIS — M4844XS Fatigue fracture of vertebra, thoracic region, sequela of fracture: Secondary | ICD-10-CM | POA: Diagnosis not present

## 2017-09-22 DIAGNOSIS — M1288 Other specific arthropathies, not elsewhere classified, other specified site: Secondary | ICD-10-CM | POA: Diagnosis not present

## 2017-09-22 DIAGNOSIS — M546 Pain in thoracic spine: Secondary | ICD-10-CM | POA: Diagnosis not present

## 2017-09-22 DIAGNOSIS — I700 Atherosclerosis of aorta: Secondary | ICD-10-CM | POA: Insufficient documentation

## 2017-09-22 DIAGNOSIS — M4804 Spinal stenosis, thoracic region: Secondary | ICD-10-CM | POA: Insufficient documentation

## 2017-09-25 ENCOUNTER — Telehealth: Payer: Self-pay | Admitting: Family Medicine

## 2017-09-25 NOTE — Telephone Encounter (Signed)
Called patient; left message to call back.

## 2017-09-25 NOTE — Telephone Encounter (Signed)
Copied from [place] [id number]. Topic: Quick Communication - See Telephone Encounter >> Sep 25, 2017 12:36 PM Bea Graff, NT wrote: CRM for notification. See Telephone encounter for: Pt is calling and would MRI results he had done on Saturday today if possible.   09/25/17.

## 2017-09-28 ENCOUNTER — Telehealth: Payer: Self-pay | Admitting: Family Medicine

## 2017-09-28 NOTE — Telephone Encounter (Signed)
Results given. Pt is in pain despite tx. Pt's wife wants treatment (interventional radiology) done ASAP d/t pts pain.

## 2017-10-01 ENCOUNTER — Telehealth: Payer: Self-pay

## 2017-10-01 ENCOUNTER — Other Ambulatory Visit: Payer: Self-pay

## 2017-10-01 MED ORDER — HYDROCODONE-ACETAMINOPHEN 2.5-325 MG PO TABS
1.0000 | ORAL_TABLET | Freq: Two times a day (BID) | ORAL | 0 refills | Status: DC | PRN
Start: 2017-10-01 — End: 2017-10-05

## 2017-10-01 NOTE — Telephone Encounter (Signed)
Patient wife is calling, very upset that nothing still has been done. Patient is in a lot of pain, states this has been ongoing for over a week. Would like to file a complaint. Please advise, call back [phone]. Wants something done today

## 2017-10-01 NOTE — Telephone Encounter (Signed)
Please call wife ASAP (routing comment) This regarding plan of care and pain control

## 2017-10-01 NOTE — Telephone Encounter (Signed)
Called patient advised wife Rx for Hydrocodone would have to be picked up from office and that it will be at front desk. Wife agreed.

## 2017-10-01 NOTE — Telephone Encounter (Addendum)
Message given to Dr. Charlett Blake regarding plan of care and pain control for patient. Per Dr. Charlett Blake patient was to have Kyphoplasty but when patient arrived they were told he needed MRI first. Patient had MRI but did not go back to have Kyphoplasty. Called [facility], waiting for Caryl Pina to return my call regarding having patient rescheduled for procedure.  Dr. Charlett Blake ordered Hydrocodone-APAP 2.5-325 mg twice daily as needed for pain. Advised age hois age he will need to be watched closely when taking this medication. Wife agreed.

## 2017-10-02 ENCOUNTER — Telehealth: Payer: Self-pay

## 2017-10-02 ENCOUNTER — Telehealth: Payer: Self-pay | Admitting: Family

## 2017-10-02 ENCOUNTER — Other Ambulatory Visit (HOSPITAL_COMMUNITY): Payer: Self-pay | Admitting: Interventional Radiology

## 2017-10-02 DIAGNOSIS — S22050A Wedge compression fracture of T5-T6 vertebra, initial encounter for closed fracture: Secondary | ICD-10-CM

## 2017-10-02 NOTE — Telephone Encounter (Signed)
Notified Dr. Charlett Blake of Pharmacy message regarding Hydrocodone/APAP.

## 2017-10-02 NOTE — Telephone Encounter (Signed)
Spoke with Pramella at [facility]. She states it is [facility]'s policy to only dispense 5 days of a narcotic that is a new start for a pt. After that they will dispense a 30 day supply if ordered.  She states that she already spoke with Santiago Glad at our office and was given authorization to dispense 10 tabs for 5 days.

## 2017-10-02 NOTE — Telephone Encounter (Signed)
Noted, will forward to PCP.

## 2017-10-02 NOTE — Telephone Encounter (Signed)
Follow up call made to [facility] with [place]. Returned call. States Dr. Kenn File.needs to know if Dr. Charlett Blake wants Biopsy done as well as Kyphoplasty. States she will fax request over for Dr. Charlett Blake to complete. Patient has been scheduled for Decembrt 17 @ 2:00 pm for consultation with radiologist. Will schedule procedure afterwards. Left message on patients answering machine regarding this information.

## 2017-10-02 NOTE — Telephone Encounter (Signed)
Please ask pharmacy why they can only dispense #10.      Copied from [place] [id number]. Topic: General - Other >> Oct 02, 2017 11:50 AM Neva Seat wrote: [place]  in [facility]  Pharmacist is only allowed for 5 days refill for pt.  Christa See [phone] >> Oct 02, 2017 12:33 PM Bunnie Domino, LPN wrote: Troy Ray pharmacist that I would send message to provider. States they can only give patient # 10 tablets of Hyrocodone-APAP 2.5-325.

## 2017-10-03 ENCOUNTER — Other Ambulatory Visit: Payer: Self-pay | Admitting: Family Medicine

## 2017-10-03 DIAGNOSIS — I10 Essential (primary) hypertension: Secondary | ICD-10-CM

## 2017-10-05 ENCOUNTER — Other Ambulatory Visit: Payer: Self-pay

## 2017-10-05 ENCOUNTER — Telehealth: Payer: Self-pay | Admitting: Family Medicine

## 2017-10-05 MED ORDER — HYDROCODONE-ACETAMINOPHEN 2.5-325 MG PO TABS: 1.0000 | ORAL_TABLET | Freq: Two times a day (BID) | ORAL | 0 refills | Status: DC | PRN

## 2017-10-05 NOTE — Telephone Encounter (Signed)
Medication Refill (Hydrocodone-Acetaminophen 2.5-325 MG TABS)

## 2017-10-05 NOTE — Telephone Encounter (Signed)
rx printed patient notified

## 2017-10-05 NOTE — Telephone Encounter (Signed)
Copied from [place] [id number]. Topic: Quick Communication - Rx Refill/Question >> Oct 05, 2017 10:00 AM Patrice Paradise wrote: Has the patient contacted their pharmacy? yes  Hydrocodone-Acetaminophen 2.5-325 MG TABS  (Agent: If no, request that the patient contact the pharmacy for the refill.)  Preferred Pharmacy (with phone number or street name): [facility] [address] [place], [place] - [address] [place] [address] Phone: [phone] Fax: [phone]   Agent: Please be advised that RX refills may take up to 48 hours. We ask that you follow-up with your pharmacy.

## 2017-10-22 ENCOUNTER — Ambulatory Visit (HOSPITAL_COMMUNITY)
Admission: RE | Admit: 2017-10-22 | Discharge: 2017-10-22 | Disposition: A | Discharge status: Home / Self Care | Payer: Medicare HMO | Source: Ambulatory Visit | Attending: Interventional Radiology | Admitting: Interventional Radiology

## 2017-10-22 ENCOUNTER — Other Ambulatory Visit (HOSPITAL_COMMUNITY): Payer: Self-pay | Admitting: Interventional Radiology

## 2017-10-22 DIAGNOSIS — M4807 Spinal stenosis, lumbosacral region: Secondary | ICD-10-CM | POA: Diagnosis not present

## 2017-10-22 DIAGNOSIS — S22050A Wedge compression fracture of T5-T6 vertebra, initial encounter for closed fracture: Secondary | ICD-10-CM

## 2017-10-22 DIAGNOSIS — M546 Pain in thoracic spine: Secondary | ICD-10-CM

## 2017-10-22 DIAGNOSIS — M4854XA Collapsed vertebra, not elsewhere classified, thoracic region, initial encounter for fracture: Secondary | ICD-10-CM | POA: Diagnosis not present

## 2017-10-22 HISTORY — PX: IR RADIOLOGIST EVAL & MGMT: IMG5224

## 2017-10-23 ENCOUNTER — Encounter (HOSPITAL_COMMUNITY): Payer: Self-pay | Admitting: Interventional Radiology

## 2017-10-25 ENCOUNTER — Encounter: Payer: Self-pay | Admitting: Family Medicine

## 2017-10-25 ENCOUNTER — Ambulatory Visit: Payer: Medicare HMO | Admitting: Family Medicine

## 2017-10-25 VITALS — BP 126/60 | HR 66 | Temp 97.9°F | Resp 18 | Wt 156.8 lb

## 2017-10-25 DIAGNOSIS — E782 Mixed hyperlipidemia: Secondary | ICD-10-CM | POA: Diagnosis not present

## 2017-10-25 DIAGNOSIS — S22050G Wedge compression fracture of T5-T6 vertebra, subsequent encounter for fracture with delayed healing: Secondary | ICD-10-CM

## 2017-10-25 DIAGNOSIS — I10 Essential (primary) hypertension: Secondary | ICD-10-CM

## 2017-10-25 DIAGNOSIS — K219 Gastro-esophageal reflux disease without esophagitis: Secondary | ICD-10-CM | POA: Diagnosis not present

## 2017-10-25 NOTE — Progress Notes (Signed)
Subjective:  I acted as a Education administrator for Dr. Charlett Blake. [place], [place]  Patient ID: Troy Ray, male    DOB: 17-Nov-1927, 83 y.o.   MRN: [id number]  No chief complaint on file.   HPI  Patient is in today for a 4 month follow up accompanied by his wife. He is noting persistent mid back pain with radiation to right flank and right abdominal wall. No further falls or injury. No change in bowel habits. No fevers or chills. Denies CP/palp/SOB/HA/congestion/fevers/GI or GU c/o. Taking meds as prescribed  Patient Care Team: [name], MD as PCP - General (Family Medicine) Calvert Cantor, MD as Consulting Physician (Ophthalmology)   Past Medical History:  Diagnosis Date  . Adenomatous colon polyp   . BPH (benign prostatic hyperplasia) 07/14/2013  . CAD (coronary artery disease)   . Carotid stenosis, bilateral    right 60-79% stenosed.  Left 40-59%  . Chronic kidney disease    chronic, stage II  . CRI (chronic renal insufficiency)   . Diverticulosis   . Dysphagia   . Esophageal stricture    Dr [name]  . Glaucoma   . Headache(784.0) 12/01/2013  . Hyperlipidemia   . Hypertension   . Insomnia 05/12/2015  . Low back pain syndrome 04/03/2014  . Medicare annual wellness visit, subsequent 02/04/2015  . Memory loss   . Postural lightheadedness   . PVD (peripheral vascular disease) ([place])   . Renal insufficiency   . Sensorineural hearing loss, bilateral   . Unspecified constipation 12/29/2012    Past Surgical History:  Procedure Laterality Date  . ABDOMINAL AORTIC ANEURYSM REPAIR  1996   Dr [name]  . CORONARY ARTERY BYPASS GRAFT    . EYE SURGERY     cataracts b/l and laser  . HERNIA REPAIR  03/23/05   left inguinal Lichtenstein repair, with mesh.  [name]  . IR RADIOLOGIST EVAL & MGMT  10/22/2017  . RENAL ARTERY STENT  1996   Dr [name].  Bilateral stenting    Family History  Problem Relation Age of Onset  . Heart disease Father        CAD  . Coronary artery disease Father    . Stroke Father   . Hearing loss Father   . Hearing loss Paternal Grandfather   . Heart disease Son   . Heart disease Brother        MI  . Cancer Neg Hx   . Diabetes Neg Hx     Social History   Socioeconomic History  . Marital status: Married    Spouse name: Not on file  . Number of children: 3  . Years of education: Not on file  . Highest education level: Not on file  Social Needs  . Financial resource strain: Not on file  . Food insecurity - worry: Not on file  . Food insecurity - inability: Not on file  . Transportation needs - medical: Not on file  . Transportation needs - non-medical: Not on file  Occupational History  . Occupation: MEDIA ASSISTANT    Employer: [employer]  Tobacco Use  . Smoking status: Former Research scientist (life sciences)  . Smokeless tobacco: Never Used  . Tobacco comment: quit 1980  Substance and Sexual Activity  . Alcohol use: Yes    Alcohol/week: 0.6 oz    Types: 1 Glasses of wine per week  . Drug use: No  . Sexual activity: Yes    Comment: lives with wife, retired, no dietary restriction  Other Topics Concern  . Not on file  Social History Narrative  . Not on file    Outpatient Medications Prior to Visit  Medication Sig Dispense Refill  . amLODipine (NORVASC) 5 MG tablet TAKE 1 TABLET TWICE DAILY 180 tablet 1  . aspirin EC 81 MG EC tablet Take 81 mg by mouth at bedtime.     [name] Kitchen atorvastatin (LIPITOR) 40 MG tablet TAKE 1 TABLET EVERY DAY 90 tablet 0  . finasteride (PROSCAR) 5 MG tablet TAKE 1 TABLET EVERY DAY 90 tablet 1  . Hydrocodone-Acetaminophen 2.5-325 MG TABS Take 1 tablet by mouth 2 (two) times daily as needed. (Patient not taking: Reported on 10/31/2017) 30 tablet 0  . losartan (COZAAR) 50 MG tablet Take 1 tablet (50 mg total) by mouth 2 (two) times daily. 180 tablet 2  . timolol (TIMOPTIC) 0.5 % ophthalmic solution Place 1 drop into both eyes daily.     [name] Kitchen acetaminophen (TYLENOL) 500 MG tablet Take 2 tablets (1,000 mg total) by mouth 2 (two)  times daily. 30 tablet 0  . LUMIGAN 0.01 % SOLN     . metoprolol succinate (TOPROL-XL) 25 MG 24 hr tablet TAKE 1 TABLET EVERY DAY WITH SUPPER 90 tablet 2  . pantoprazole (PROTONIX) 40 MG tablet TAKE 1 TABLET EVERY DAY 90 tablet 2  . triamcinolone cream (KENALOG) 0.1 % Apply thin layer to affected area on feet twice daily for 10 days. 30 g 1  . ALPRAZolam (XANAX) 0.25 MG tablet Take one to two tablets by mouth prior to MRI.  You  must not drive, drink alcohol, or operate dangerous equipment after taking this medication. 2 tablet 0  . tiZANidine (ZANAFLEX) 2 MG tablet Take 1 tablet (2 mg total) every 8 (eight) hours as needed by mouth for muscle spasms. 30 tablet 1  . traMADol (ULTRAM) 50 MG tablet 1/2 to 1 po bid prn 60 tablet 2   No facility-administered medications prior to visit.     No Known Allergies  Review of Systems  Constitutional: Negative for fever and malaise/fatigue.  HENT: Negative for congestion.   Eyes: Negative for blurred vision.  Respiratory: Negative for shortness of breath.   Cardiovascular: Negative for chest pain, palpitations and leg swelling.  Gastrointestinal: Positive for abdominal pain. Negative for blood in stool and nausea.  Genitourinary: Negative for dysuria and frequency.  Musculoskeletal: Positive for back pain. Negative for falls.  Skin: Negative for rash.  Neurological: Negative for dizziness, loss of consciousness and headaches.  Endo/Heme/Allergies: Negative for environmental allergies.  Psychiatric/Behavioral: Negative for depression. The patient is not nervous/anxious.        Objective:    Physical Exam  Constitutional: He is oriented to person, place, and time. He appears well-developed and well-nourished. No distress.  HENT:  Head: Normocephalic and atraumatic.  Nose: Nose normal.  Eyes: Right eye exhibits no discharge. Left eye exhibits no discharge.  Neck: Normal range of motion. Neck supple.  Cardiovascular: Normal rate and regular  rhythm.  No murmur heard. Pulmonary/Chest: Effort normal and breath sounds normal.  Abdominal: Soft. Bowel sounds are normal. There is no tenderness.  Musculoskeletal: He exhibits no edema.  Neurological: He is alert and oriented to person, place, and time.  Skin: Skin is warm and dry.  Psychiatric: He has a normal mood and affect.  Nursing note and vitals reviewed.   BP 126/60 (BP Location: Left Arm, Patient Position: Sitting, Cuff Size: Normal)   Pulse 66   Temp 97.9 F (36.6 C) (  Oral)   Resp 18   Wt 156 lb 12.8 oz (71.1 kg)   SpO2 97%   BMI 24.56 kg/m  Wt Readings from Last 3 Encounters:  10/25/17 156 lb 12.8 oz (71.1 kg)  09/11/17 161 lb 3.2 oz (73.1 kg)  08/16/17 162 lb (73.5 kg)   BP Readings from Last 3 Encounters:  10/25/17 126/60  09/11/17 (!) 146/60  08/16/17 (!) 142/92     Immunization History  Administered Date(s) Administered  . Influenza Whole 08/17/2008, 09/20/2009, 08/23/2010  . Influenza, High Dose Seasonal PF 07/29/2015, 06/29/2016, 08/16/2017  . Influenza,inj,Quad PF,6+ Mos 07/11/2013, 08/13/2014  . Pneumococcal Conjugate-13 08/13/2014  . Pneumococcal Polysaccharide-23 11/20/2006, 09/30/2012  . Zoster 09/22/2010    Health Maintenance  Topic Date Due  . TETANUS/TDAP  03/[birth year]/1949  . INFLUENZA VACCINE  Completed  . PNA vac Low Risk Adult  Completed    Lab Results  Component Value Date   WBC 12.7 (H) 09/19/2017   HGB 13.5 09/19/2017   HCT 41.2 09/19/2017   PLT 282.0 09/19/2017   GLUCOSE 107 (H) 09/11/2017   CHOL 129 06/18/2017   TRIG 150.0 (H) 06/18/2017   HDL 34.20 (L) 06/18/2017   LDLCALC 65 06/18/2017   ALT 12 09/11/2017   AST 16 09/11/2017   NA 136 09/11/2017   K 4.1 09/11/2017   CL 102 09/11/2017   CREATININE 1.36 09/11/2017   BUN 23 09/11/2017   CO2 26 09/11/2017   TSH 3.87 06/18/2017   PSA 0.75 10/06/2013   INR 1.03 04/12/2017   HGBA1C 6.2 06/18/2017   MICROALBUR 0.87 10/06/2013    Lab Results  Component Value Date   TSH  3.87 06/18/2017   Lab Results  Component Value Date   WBC 12.7 (H) 09/19/2017   HGB 13.5 09/19/2017   HCT 41.2 09/19/2017   MCV 96.0 09/19/2017   PLT 282.0 09/19/2017   Lab Results  Component Value Date   NA 136 09/11/2017   K 4.1 09/11/2017   CO2 26 09/11/2017   GLUCOSE 107 (H) 09/11/2017   BUN 23 09/11/2017   CREATININE 1.36 09/11/2017   BILITOT 0.6 09/11/2017   ALKPHOS 59 09/11/2017   AST 16 09/11/2017   ALT 12 09/11/2017   PROT 7.4 09/11/2017   ALBUMIN 3.9 09/11/2017   CALCIUM 9.7 09/11/2017   ANIONGAP 9 04/12/2017   GFR 52.63 (L) 09/11/2017   Lab Results  Component Value Date   CHOL 129 06/18/2017   Lab Results  Component Value Date   HDL 34.20 (L) 06/18/2017   Lab Results  Component Value Date   LDLCALC 65 06/18/2017   Lab Results  Component Value Date   TRIG 150.0 (H) 06/18/2017   Lab Results  Component Value Date   CHOLHDL 4 06/18/2017   Lab Results  Component Value Date   HGBA1C 6.2 06/18/2017         Assessment & Plan:   Problem List Items Addressed This Visit    Hyperlipidemia, mixed    Encouraged heart healthy diet, increase exercise, avoid trans fats, consider a krill oil cap daily      Essential hypertension    Well controlled, no changes to meds. Encouraged heart healthy diet such as the DASH diet and exercise as tolerated.       Gastroesophageal reflux disease without esophagitis    .       T6 vertebral fracture ([place]) - Primary    Is awaiting vertebroplasty to stabilize fracture. Continue pain meds prn.  I have discontinued Naomi W. Turcott's traMADol, tiZANidine, and ALPRAZolam. I am also having him maintain his aspirin EC, timolol, atorvastatin, losartan, amLODipine, finasteride, and Hydrocodone-Acetaminophen.  No orders of the defined types were placed in this encounter.   CMA served as Education administrator during this visit. History, Physical and Plan performed by medical provider. Documentation and orders reviewed and  attested to.  Penni Homans, MD

## 2017-10-25 NOTE — Patient Instructions (Addendum)
For pain if severe can try taking the Hydrocodone/APAP 2.5 mg/325 tabs, 2 at a time and see if you get more pain relief. If so then let us know and we can rewrite the prescription  Encouraged increased rest and hydration, add probiotics, zinc such as Coldeze or Xicam. Treat fevers as needed. Vitamin C 500 mg daily, elderberry liquid or capsules. Plain Mucinex twice daily   Back Pain, Adult Many adults have back pain from time to time. Common causes of back pain include:  A strained muscle or ligament.  Wear and tear (degeneration) of the spinal disks.  Arthritis.  A hit to the back.  Back pain can be short-lived (acute) or last a long time (chronic). A physical exam, lab tests, and imaging studies may be done to find the cause of your pain. Follow these instructions at home: Managing pain and stiffness  Take over-the-counter and prescription medicines only as told by your health care provider.  If directed, apply heat to the affected area as often as told by your health care provider. Use the heat source that your health care provider recommends, such as a moist heat pack or a heating pad. ? Place a towel between your skin and the heat source. ? Leave the heat on for 20-30 minutes. ? Remove the heat if your skin turns bright red. This is especially important if you are unable to feel pain, heat, or cold. You have a greater risk of getting burned.  If directed, apply ice to the injured area: ? Put ice in a plastic bag. ? Place a towel between your skin and the bag. ? Leave the ice on for 20 minutes, 2-3 times a day for the first 2-3 days. Activity  Do not stay in bed. Resting more than 1-2 days can delay your recovery.  Take short walks on even surfaces as soon as you are able. Try to increase the length of time you walk each day.  Do not sit, drive, or stand in one place for more than 30 minutes at a time. Sitting or standing for long periods of time can put stress on your  back.  Use proper lifting techniques. When you bend and lift, use positions that put less stress on your back: ? [place] your knees. ? Keep the load close to your body. ? Avoid twisting.  Exercise regularly as told by your health care provider. Exercising will help your back heal faster. This also helps prevent back injuries by keeping muscles strong and flexible.  Your health care provider may recommend that you see a physical therapist. This person can help you come up with a safe exercise program. Do any exercises as told by your physical therapist. Lifestyle  Maintain a healthy weight. Extra weight puts stress on your back and makes it difficult to have good posture.  Avoid activities or situations that make you feel anxious or stressed. Learn ways to manage anxiety and stress. One way to manage stress is through exercise. Stress and anxiety increase muscle tension and can make back pain worse. General instructions  Sleep on a firm mattress in a comfortable position. Try lying on your side with your knees slightly bent. If you lie on your back, put a pillow under your knees.  Follow your treatment plan as told by your health care provider. This may include: ? Cognitive or behavioral therapy. ? Acupuncture or massage therapy. ? Meditation or yoga. Contact a health care provider if:  You have pain that  is not relieved with rest or medicine.  You have increasing pain going down into your legs or buttocks.  Your pain does not improve in 2 weeks.  You have pain at night.  You lose weight.  You have a fever or chills. Get help right away if:  You develop new bowel or bladder control problems.  You have unusual weakness or numbness in your arms or legs.  You develop nausea or vomiting.  You develop abdominal pain.  You feel faint. Summary  Many adults have back pain from time to time. A physical exam, lab tests, and imaging studies may be done to find the cause of your  pain.  Use proper lifting techniques. When you bend and lift, use positions that put less stress on your back.  Take over-the-counter and prescription medicines and apply heat or ice as directed by your health care provider. This information is not intended to replace advice given to you by your health care provider. Make sure you discuss any questions you have with your health care provider. Document Released: 10/23/2005 Document Revised: 11/27/2016 Document Reviewed: 11/27/2016 Elsevier Interactive Patient Education  [employer].

## 2017-10-26 ENCOUNTER — Other Ambulatory Visit: Payer: Self-pay

## 2017-10-26 MED ORDER — PANTOPRAZOLE SODIUM 40 MG PO TBEC: 40.0000 mg | DELAYED_RELEASE_TABLET | Freq: Every day | ORAL | 2 refills | Status: DC

## 2017-10-26 MED ORDER — METOPROLOL SUCCINATE ER 25 MG PO TB24: ORAL_TABLET | ORAL | 2 refills | Status: DC

## 2017-11-02 NOTE — Assessment & Plan Note (Signed)
Well controlled, no changes to meds. Encouraged heart healthy diet such as the DASH diet and exercise as tolerated.  °

## 2017-11-02 NOTE — Assessment & Plan Note (Signed)
Is awaiting vertebroplasty to stabilize fracture. Continue pain meds prn.

## 2017-11-02 NOTE — Assessment & Plan Note (Signed)
Encouraged heart healthy diet, increase exercise, avoid trans fats, consider a krill oil cap daily 

## 2017-11-07 ENCOUNTER — Other Ambulatory Visit: Payer: Self-pay | Admitting: Radiology

## 2017-11-08 ENCOUNTER — Encounter (HOSPITAL_COMMUNITY): Payer: Self-pay

## 2017-11-08 ENCOUNTER — Ambulatory Visit (HOSPITAL_COMMUNITY)
Admission: RE | Admit: 2017-11-08 | Discharge: 2017-11-08 | Disposition: A | Discharge status: Home / Self Care | Payer: Medicare HMO | Source: Ambulatory Visit | Attending: Interventional Radiology | Admitting: Interventional Radiology

## 2017-11-08 DIAGNOSIS — E785 Hyperlipidemia, unspecified: Secondary | ICD-10-CM | POA: Diagnosis not present

## 2017-11-08 DIAGNOSIS — I129 Hypertensive chronic kidney disease with stage 1 through stage 4 chronic kidney disease, or unspecified chronic kidney disease: Secondary | ICD-10-CM | POA: Diagnosis not present

## 2017-11-08 DIAGNOSIS — M546 Pain in thoracic spine: Secondary | ICD-10-CM

## 2017-11-08 DIAGNOSIS — M4854XA Collapsed vertebra, not elsewhere classified, thoracic region, initial encounter for fracture: Secondary | ICD-10-CM | POA: Diagnosis not present

## 2017-11-08 DIAGNOSIS — Z79899 Other long term (current) drug therapy: Secondary | ICD-10-CM | POA: Insufficient documentation

## 2017-11-08 DIAGNOSIS — S22050A Wedge compression fracture of T5-T6 vertebra, initial encounter for closed fracture: Secondary | ICD-10-CM

## 2017-11-08 DIAGNOSIS — I2510 Atherosclerotic heart disease of native coronary artery without angina pectoris: Secondary | ICD-10-CM | POA: Diagnosis not present

## 2017-11-08 DIAGNOSIS — I739 Peripheral vascular disease, unspecified: Secondary | ICD-10-CM | POA: Diagnosis not present

## 2017-11-08 DIAGNOSIS — Z951 Presence of aortocoronary bypass graft: Secondary | ICD-10-CM | POA: Insufficient documentation

## 2017-11-08 DIAGNOSIS — Z7982 Long term (current) use of aspirin: Secondary | ICD-10-CM | POA: Diagnosis not present

## 2017-11-08 DIAGNOSIS — H409 Unspecified glaucoma: Secondary | ICD-10-CM | POA: Insufficient documentation

## 2017-11-08 DIAGNOSIS — N189 Chronic kidney disease, unspecified: Secondary | ICD-10-CM | POA: Insufficient documentation

## 2017-11-08 DIAGNOSIS — Z87891 Personal history of nicotine dependence: Secondary | ICD-10-CM | POA: Insufficient documentation

## 2017-11-08 HISTORY — PX: IR KYPHO THORACIC WITH BONE BIOPSY: IMG5518

## 2017-11-08 LAB — CBC
HCT: 40.5 % (ref 39.0–52.0)
Hemoglobin: 13.5 g/dL (ref 13.0–17.0)
MCH: 31 pg (ref 26.0–34.0)
MCHC: 33.3 g/dL (ref 30.0–36.0)
MCV: 93.1 fL (ref 78.0–100.0)
PLATELETS: 285 10*3/uL (ref 150–400)
RBC: 4.35 MIL/uL (ref 4.22–5.81)
RDW: 13.7 % (ref 11.5–15.5)
WBC: 11.9 10*3/uL — AB (ref 4.0–10.5)

## 2017-11-08 LAB — PROTIME-INR
INR: 1.08
Prothrombin Time: 13.9 seconds (ref 11.4–15.2)

## 2017-11-08 LAB — BASIC METABOLIC PANEL
Anion gap: 8 (ref 5–15)
BUN: 21 mg/dL — AB (ref 6–20)
CO2: 24 mmol/L (ref 22–32)
CREATININE: 1.31 mg/dL — AB (ref 0.61–1.24)
Calcium: 8.9 mg/dL (ref 8.9–10.3)
Chloride: 104 mmol/L (ref 101–111)
GFR calc Af Amer: 55 mL/min — ABNORMAL LOW (ref >60)
GFR, EST NON AFRICAN AMERICAN: 47 mL/min — AB (ref >60)
Glucose, Bld: 101 mg/dL — ABNORMAL HIGH (ref 65–99)
Potassium: 3.8 mmol/L (ref 3.5–5.1)
Sodium: 136 mmol/L (ref 135–145)

## 2017-11-08 MED ORDER — BUPIVACAINE HCL (PF) 0.5 % IJ SOLN
INTRAMUSCULAR | Status: AC
Start: 2017-11-08 — End: 2017-11-08
  Filled 2017-11-08: qty 30

## 2017-11-08 MED ORDER — IOPAMIDOL (ISOVUE-300) INJECTION 61%
INTRAVENOUS | Status: AC
  Administered 2017-11-08: 5 mL
  Filled 2017-11-08: qty 50

## 2017-11-08 MED ORDER — SODIUM CHLORIDE 0.9 % IV SOLN: INTRAVENOUS | Status: DC

## 2017-11-08 MED ORDER — MIDAZOLAM HCL 2 MG/2ML IJ SOLN
INTRAMUSCULAR | Status: AC | PRN
  Administered 2017-11-08: 1 mg via INTRAVENOUS
  Administered 2017-11-08: 0.5 mg via INTRAVENOUS
  Administered 2017-11-08: 1 mg via INTRAVENOUS

## 2017-11-08 MED ORDER — FENTANYL CITRATE (PF) 100 MCG/2ML IJ SOLN
INTRAMUSCULAR | Status: AC | PRN
  Administered 2017-11-08 (×4): 25 ug via INTRAVENOUS

## 2017-11-08 MED ORDER — CEFAZOLIN SODIUM-DEXTROSE 2-4 GM/100ML-% IV SOLN
INTRAVENOUS | Status: AC
  Filled 2017-11-08: qty 100

## 2017-11-08 MED ORDER — FENTANYL CITRATE (PF) 100 MCG/2ML IJ SOLN
INTRAMUSCULAR | Status: AC
  Filled 2017-11-08: qty 4

## 2017-11-08 MED ORDER — HYDROMORPHONE HCL 1 MG/ML IJ SOLN
INTRAMUSCULAR | Status: AC
  Filled 2017-11-08: qty 1

## 2017-11-08 MED ORDER — TOBRAMYCIN SULFATE 1.2 G IJ SOLR
INTRAMUSCULAR | Status: AC
  Filled 2017-11-08: qty 1.2

## 2017-11-08 MED ORDER — BUPIVACAINE HCL 0.25 % IJ SOLN
INTRAMUSCULAR | Status: AC | PRN
  Administered 2017-11-08: 15 mL

## 2017-11-08 MED ORDER — MIDAZOLAM HCL 2 MG/2ML IJ SOLN
INTRAMUSCULAR | Status: AC
  Filled 2017-11-08: qty 4

## 2017-11-08 MED ORDER — SODIUM CHLORIDE 0.9 % IV SOLN
INTRAVENOUS | Status: AC | PRN
  Administered 2017-11-08: 10 mL/h via INTRAVENOUS

## 2017-11-08 MED ORDER — CEFAZOLIN SODIUM-DEXTROSE 2-4 GM/100ML-% IV SOLN
2.0000 g | INTRAVENOUS | Status: AC
  Administered 2017-11-08: 2 g via INTRAVENOUS

## 2017-11-08 NOTE — Procedures (Signed)
S/P T 6 balloon KP

## 2017-11-08 NOTE — Discharge Instructions (Addendum)
1. No stooping ,bending or lifting more than 10 lbs for 2 weeks. 2.Use walker to ambulate for 2 weeks.[name] Kitchen 3.RTC PRN  In  2 weeks. KYPHOPLASTY/VERTEBROPLASTY DISCHARGE INSTRUCTIONS  Medications: (check all that apply)     Resume all home medications as before procedure.                     Continue your pain medications as prescribed as needed.  Over the next 3-5 days, decrease your pain medication as tolerated.  Over the counter medications (i.e. Tylenol, ibuprofen, and aleve) may be substituted once severe/moderate pain symptoms have subsided.   Wound Care: - Bandages may be removed the day following your procedure.  You may get your incision wet once bandages are removed.  Bandaids may be used to cover the incisions until scab formation.  Topical ointments are optional.  - If you develop a fever greater than 101 degrees, have increased skin redness at the incision sites or pus-like oozing from incisions occurring within 1 week of the procedure, contact radiology at [phone] or [phone].  - Ice pack to back for 15-20 minutes 2-3 time per day for first 2-3 days post procedure.  The ice will expedite muscle healing and help with the pain from the incisions.   Activity: - Bedrest today with limited activity for 24 hours post procedure.  - No driving for 48 hours.  - Increase your activity as tolerated after bedrest (with assistance if necessary).  - Refrain from any strenuous activity or heavy lifting (greater than 10 lbs.).   Follow up: - Contact ordering physician with any concerns. .  - If a biopsy was performed at the time of your procedure, your referring physician should receive the results in usually 2-3 days.     Moderate Conscious Sedation, Adult, Care After These instructions provide you with information about caring for yourself after your procedure. Your health care provider may also give you more specific instructions. Your treatment has been planned according to  current medical practices, but problems sometimes occur. Call your health care provider if you have any problems or questions after your procedure. What can I expect after the procedure? After your procedure, it is common:  To feel sleepy for several hours.  To feel clumsy and have poor balance for several hours.  To have poor judgment for several hours.  To vomit if you eat too soon.  Follow these instructions at home: For at least 24 hours after the procedure:   Do not: ? Participate in activities where you could fall or become injured. ? Drive. ? Use heavy machinery. ? Drink alcohol. ? Take sleeping pills or medicines that cause drowsiness. ? Make important decisions or sign legal documents. ? Take care of children on your own.  Rest. Eating and drinking  Follow the diet recommended by your health care provider.  If you vomit: ? Drink water, juice, or soup when you can drink without vomiting. ? Make sure you have little or no nausea before eating solid foods. General instructions  Have a responsible adult stay with you until you are awake and alert.  Take over-the-counter and prescription medicines only as told by your health care provider.  If you smoke, do not smoke without supervision.  Keep all follow-up visits as told by your health care provider. This is important. Contact a health care provider if:  You keep feeling nauseous or you keep vomiting.  You feel light-headed.  You develop a rash.  You have a fever. Get help right away if:  You have trouble breathing. This information is not intended to replace advice given to you by your health care provider. Make sure you discuss any questions you have with your health care provider. Document Released: 08/13/2013 Document Revised: 03/27/2016 Document Reviewed: 02/12/2016 Elsevier Interactive Patient Education  [employer].

## 2017-11-08 NOTE — Sedation Documentation (Signed)
Called to give report. Nurse unavailable. Will call back 

## 2017-11-08 NOTE — H&P (Signed)
Chief Complaint: Patient was seen in consultation today for T6 Fx at the request of Troy Ray  Referring Physician(s): [name]  Supervising Physician: [name]  Patient Status: [facility] - Out-pt  History of Present Illness: Troy Ray is a 84 y.o. male with T6 compression fracture. Was seen about 2 weeks ago by Dr. [name] and is now scheduled for KP. Still symptomatic with back pain. No recent fevers, chills, illness Feels well otherwise Family at bedside Has been NPO this am  Past Medical History:  Diagnosis Date  . Adenomatous colon polyp   . BPH (benign prostatic hyperplasia) 07/14/2013  . CAD (coronary artery disease)   . Carotid stenosis, bilateral    right 60-79% stenosed.  Left 40-59%  . Chronic kidney disease    chronic, stage II  . CRI (chronic renal insufficiency)   . Diverticulosis   . Dysphagia   . Esophageal stricture    Dr Troy Ray  . Glaucoma   . Headache(784.0) 12/01/2013  . Hyperlipidemia   . Hypertension   . Insomnia 05/12/2015  . Low back pain syndrome 04/03/2014  . Medicare annual wellness visit, subsequent 02/04/2015  . Memory loss   . Postural lightheadedness   . PVD (peripheral vascular disease) ([place])   . Renal insufficiency   . Sensorineural hearing loss, bilateral   . Unspecified constipation 12/29/2012    Past Surgical History:  Procedure Laterality Date  . ABDOMINAL AORTIC ANEURYSM REPAIR  1996   Dr [name]  . CORONARY ARTERY BYPASS GRAFT    . EYE SURGERY     cataracts b/l and laser  . HERNIA REPAIR  03/23/05   left inguinal Lichtenstein repair, with mesh.  Troy Skates MD  . IR RADIOLOGIST EVAL & MGMT  10/22/2017  . RENAL ARTERY STENT  1996   Dr [name].  Bilateral stenting    Allergies: Patient has no known allergies.  Medications: Prior to Admission medications   Medication Sig Start Date End Date Taking? Authorizing Provider  amLODipine (NORVASC) 5 MG tablet TAKE 1 TABLET TWICE DAILY 10/03/17   Yes [name], MD  aspirin EC 81 MG EC tablet Take 81 mg by mouth at bedtime.  02/21/11  Yes [name], [name], DO  atorvastatin (LIPITOR) 40 MG tablet TAKE 1 TABLET EVERY DAY 05/11/17  Yes [name], MD  finasteride (PROSCAR) 5 MG tablet TAKE 1 TABLET EVERY DAY 10/03/17  Yes [name], MD  latanoprost (XALATAN) 0.005 % ophthalmic solution Place 1 drop into both eyes at bedtime. 08/13/17  Yes [provider]  losartan (COZAAR) 50 MG tablet Take 1 tablet (50 mg total) by mouth 2 (two) times daily. 08/10/17  Yes [name], MD  metoprolol succinate (TOPROL-XL) 25 MG 24 hr tablet TAKE 1 TABLET EVERY DAY WITH SUPPER 10/26/17  Yes [name], MD  Multiple Vitamins-Minerals (MULTIVITAMIN WITH MINERALS) tablet Take 1 tablet by mouth daily.   Yes [provider]  pantoprazole (PROTONIX) 40 MG tablet Take 1 tablet (40 mg total) by mouth daily. 10/26/17  Yes [name], MD  timolol (TIMOPTIC) 0.5 % ophthalmic solution Place 1 drop into both eyes daily.  02/25/15  Yes [provider]  Hydrocodone-Acetaminophen 2.5-325 MG TABS Take 1 tablet by mouth 2 (two) times daily as needed. Patient not taking: Reported on 10/31/2017 10/05/17   [name], MD     Family History  Problem Relation Age of Onset  . Heart disease Father  CAD  . Coronary artery disease Father   . Stroke Father   . Hearing loss Father   . Hearing loss Paternal Grandfather   . Heart disease Son   . Heart disease Brother        MI  . Cancer Neg Hx   . Diabetes Neg Hx     Social History   Socioeconomic History  . Marital status: Married    Spouse name: None  . Number of children: 3  . Years of education: None  . Highest education level: None  Social Needs  . Financial resource strain: None  . Food insecurity - worry: None  . Food insecurity - inability: None  . Transportation needs - medical: None  . Transportation needs - non-medical: None  Occupational History   . Occupation: MEDIA ASSISTANT    Employer: [employer]  Tobacco Use  . Smoking status: Former Research scientist (life sciences)  . Smokeless tobacco: Never Used  . Tobacco comment: quit 1980  Substance and Sexual Activity  . Alcohol use: Yes    Alcohol/week: 0.6 oz    Types: 1 Glasses of wine per week  . Drug use: No  . Sexual activity: Yes    Comment: lives with wife, retired, no dietary restriction  Other Topics Concern  . None  Social History Narrative  . None     Review of Systems: A 12 point ROS discussed and pertinent positives are indicated in the HPI above.  All other systems are negative.  Review of Systems  Vital Signs: BP 134/71   Pulse 60   Temp 97.7 F (36.5 C) (Oral)   Resp 16   Ht 5\' 9"  (1.753 m)   Wt 155 lb (70.3 kg)   SpO2 100%   BMI 22.89 kg/m   Physical Exam  Constitutional: He is oriented to person, place, and time. He appears well-developed. No distress.  HENT:  Head: Normocephalic.  Mouth/Throat: Oropharynx is clear and moist.  Neck: Normal range of motion. No JVD present. No tracheal deviation present.  Cardiovascular: Normal rate, regular rhythm and normal heart sounds.  Pulmonary/Chest: Effort normal and breath sounds normal. No respiratory distress.  Abdominal: Soft. He exhibits no distension. There is no tenderness.  Neurological: He is alert and oriented to person, place, and time.  Skin: Skin is warm and dry.  Psychiatric: He has a normal mood and affect.    Imaging: Ir Radiologist Eval & Mgmt  Result Date: 10/23/2017 EXAM: NEW PATIENT OFFICE VISIT CHIEF COMPLAINT: Severe midthoracic and right posterolateral thoracic pain due to compression fracture at T6. Current Pain Level: 1-10 HISTORY OF PRESENT ILLNESS: The patient is an 84 year old right-handed gentleman referred for evaluation and management of new onset of severe debilitating midthoracic pain. The patient is accompanied by her 2 sons and his spouse. The patient apparently has had chronic low  back pain in the lumbar region for many years on account of degenerative disc disease and also probably spinal canal stenosis. The thoracic pain started suddenly approximately 4-5 weeks ago when the patient woke up with severe pain in the posterolateral aspect of his right chest at the level of his right nipple. This pain radiated into the middle of his back between the shoulder blades. He describes this pain as severe and debilitating though cannot put a number on a scale of 1-10. The pain is severe with bending, stooping, coughing, sneezing. It is relieved to some degree by lying flat on his back. However, there is minimal relief from taking  pain medications in the form of hydrocodone. The patient denies any autonomic dysfunction of his bowel or bladder activities. He has no history of chills, fever or rigors. He denies any UTI symptoms of dysuria, hematuria or of polyuria. The patient is able to sleep at night without difficulty. He ambulates with a cane which he has done for the past year and a half. The patient's appetite is normal. His weight is steady according to his wife. The patient has been sedentary for the past 2 years. Past Medical History: Adenomatous colon polyp. Benign prostatic hyperplasia. Coronary artery disease. History of bilateral carotid artery stenosis. Chronic renal disease stage I. Renal insufficiency. Diverticulosis. Dysphagia. Esophageal stricture. Glaucoma. Headaches. Hyperlipidemia. Hypertension. Low back pain syndrome as described above. Memory issues. Peripheral vascular disease. Sensorineural hearing loss bilaterally. Past Surgical History: Abdominal aortic aneurysm repair. Coronary artery bypass surgery. Eye surgery. Hernia repair. Renal artery stent. Medications: Tylenol, alprazolam, amlodipine, aspirin 81 mg a day, Lipitor, Proscar, Cozaar, Lumigan 0.01 percent solution eyedrops. Metoprolol. Protonix. Timoptic 0.5% ophthalmic solution. Allergies: No known allergies. Social  History: Married. Lives with his wife and children. Drinks alcohol occasionally. Denies smoking cigarettes or using illicit chemicals. Family History: Coronary artery disease. Stroke in his father. Bilateral hearing loss in father and grandfather. Heart disease in son. Heart disease in brother. REVIEW OF SYSTEMS: Negative unless as mentioned above. PHYSICAL EXAMINATION: Appears in mild distress on account of his pain in the back. Neurologically intact, otherwise. Patient exquisitely tender just below the shoulder blades. ASSESSMENT AND PLAN: The patient's recent MRI of the thoracic spine and upper lumbar spine was reviewed. This revealed the presence of a compression fracture at T6 with loss of about 40% of the vertebral body height anteriorly. Minimal retropulsion was noted. The patient also appeared to have moderate to moderately severe spinal canal stenosis at the L4-5 and L5-S1 levels on the limited imaging provided. The option of vertebral body augmentation to prevent further collapse and also to alleviate pain was discussed with the patient and the family. The procedure, the risks, benefits were all reviewed. Questions were answered to their satisfaction. The patient and the family would like to proceed with vertebral body augmentation at T6. This may be vertebroplasty or kyphoplasty depending on the appearance of the vertebral body at the time of the procedure. They were informed that the vertebral body augmentation at T6 would have minimal impaction in terms of alleviation of pain related to spinal canal stenosis or lower lumbar degenerate disc disease. They expressed understanding of this. The procedure will be set up as soon as possible. They were asked to call should they have any concerns or questions. Electronically Signed   By: [name] M.D.   On: 10/23/2017 07:11    Labs:  CBC: Recent Labs    09/11/17 1412 09/14/17 1418 09/19/17 1041 11/08/17 0640  WBC 13.7* 14.5* 12.7* 11.9*    HGB 13.5 13.7 13.5 13.5  HCT 40.8 41.3 41.2 40.5  PLT 280.0 258 282.0 285    COAGS: Recent Labs    04/12/17 1356 11/08/17 0640  INR 1.03 1.08  APTT 35  --     BMP: Recent Labs    12/27/16 1001 04/12/17 1356 06/18/17 1010 09/11/17 1412  NA 137 137 137 136  K 4.0 4.4 4.3 4.1  CL 106 104 103 102  CO2 26 24 26 26   GLUCOSE 112* 117* 107* 107*  BUN 22 26* 22 23  CALCIUM 9.0 9.3 9.8 9.7  CREATININE 1.20 1.28*  1.07 1.36  GFRNONAA  --  49*  --   --   GFRAA  --  56*  --   --     LIVER FUNCTION TESTS: Recent Labs    12/27/16 1001 04/12/17 1356 06/18/17 1010 09/11/17 1412  BILITOT 0.6 0.7 0.5 0.6  AST 21 26 16 16   ALT 17 20 13 12   ALKPHOS 51 58 53 59  PROT 6.9 7.3 7.0 7.4  ALBUMIN 4.0 3.9 4.1 3.9    TUMOR MARKERS: No results for input(s): AFPTM, CEA, CA199, CHROMGRNA in the last 8760 hours.  Assessment and Plan: Acute symptomatic T6 compression fracture. For VP/KP Labs pending Risks and benefits of T6 KP were discussed with the patient including, but not limited to education regarding the natural healing process of compression fractures without intervention, bleeding, infection, cement migration which may cause spinal cord damage, paralysis, pulmonary embolism or even death.  This interventional procedure involves the use of X-rays and because of the nature of the planned procedure, it is possible that we will have prolonged use of X-ray fluoroscopy.  Potential radiation risks to you include (but are not limited to) the following: - A slightly elevated risk for cancer  several years later in life. This risk is typically less than 0.5% percent. This risk is low in comparison to the normal incidence of human cancer, which is 33% for women and 50% for men according to the [place]. - Radiation induced injury can include skin redness, resembling a rash, tissue breakdown / ulcers and hair loss (which can be temporary or permanent).   The likelihood of  either of these occurring depends on the difficulty of the procedure and whether you are sensitive to radiation due to previous procedures, disease, or genetic conditions.   IF your procedure requires a prolonged use of radiation, you will be notified and given written instructions for further action.  It is your responsibility to monitor the irradiated area for the 2 weeks following the procedure and to notify your physician if you are concerned that you have suffered a radiation induced injury.    All of the patient's questions were answered, patient is agreeable to proceed.  Consent signed and in chart.  Thank you for this interesting consult.  I greatly enjoyed meeting SHAUNTE WEISSINGER and look forward to participating in their care.  A copy of this report was sent to the requesting provider on this date.  Electronically Signed: Ascencion Dike, PA-C 11/08/2017, 7:52 AM   I spent a total of 20 minutes in face to face in clinical consultation, greater than 50% of which was counseling/coordinating care for kyphoplasty

## 2017-11-12 ENCOUNTER — Encounter (HOSPITAL_COMMUNITY): Payer: Self-pay | Admitting: Interventional Radiology

## 2017-11-27 DIAGNOSIS — H401132 Primary open-angle glaucoma, bilateral, moderate stage: Secondary | ICD-10-CM | POA: Diagnosis not present

## 2017-11-27 DIAGNOSIS — H52223 Regular astigmatism, bilateral: Secondary | ICD-10-CM | POA: Diagnosis not present

## 2017-11-27 DIAGNOSIS — H524 Presbyopia: Secondary | ICD-10-CM | POA: Diagnosis not present

## 2017-11-27 DIAGNOSIS — H26492 Other secondary cataract, left eye: Secondary | ICD-10-CM | POA: Diagnosis not present

## 2017-12-06 ENCOUNTER — Encounter: Payer: Self-pay | Admitting: Family Medicine

## 2017-12-06 ENCOUNTER — Ambulatory Visit (INDEPENDENT_AMBULATORY_CARE_PROVIDER_SITE_OTHER): Payer: Medicare HMO | Admitting: Family Medicine

## 2017-12-06 ENCOUNTER — Ambulatory Visit (HOSPITAL_BASED_OUTPATIENT_CLINIC_OR_DEPARTMENT_OTHER)
Admission: RE | Admit: 2017-12-06 | Discharge: 2017-12-06 | Disposition: A | Discharge status: Home / Self Care | Payer: Medicare HMO | Source: Ambulatory Visit | Attending: Family Medicine | Admitting: Family Medicine

## 2017-12-06 VITALS — BP 128/64 | HR 63 | Temp 97.6°F | Resp 16 | Ht 69.0 in | Wt 159.6 lb

## 2017-12-06 DIAGNOSIS — I10 Essential (primary) hypertension: Secondary | ICD-10-CM

## 2017-12-06 DIAGNOSIS — M545 Low back pain: Secondary | ICD-10-CM

## 2017-12-06 DIAGNOSIS — D72829 Elevated white blood cell count, unspecified: Secondary | ICD-10-CM

## 2017-12-06 DIAGNOSIS — E782 Mixed hyperlipidemia: Secondary | ICD-10-CM

## 2017-12-06 DIAGNOSIS — M8448XS Pathological fracture, other site, sequela: Secondary | ICD-10-CM | POA: Insufficient documentation

## 2017-12-06 DIAGNOSIS — R739 Hyperglycemia, unspecified: Secondary | ICD-10-CM

## 2017-12-06 DIAGNOSIS — M533 Sacrococcygeal disorders, not elsewhere classified: Secondary | ICD-10-CM | POA: Diagnosis not present

## 2017-12-06 DIAGNOSIS — K5900 Constipation, unspecified: Secondary | ICD-10-CM | POA: Diagnosis not present

## 2017-12-06 DIAGNOSIS — S3219XA Other fracture of sacrum, initial encounter for closed fracture: Secondary | ICD-10-CM | POA: Diagnosis not present

## 2017-12-06 NOTE — Patient Instructions (Addendum)
Encouraged increased hydration and fiber in diet. Daily probiotics. If bowels not moving can use MOM 2 tbls po in 4 oz of warm prune juice by mouth every 2-3 days. If no results then repeat in 4 hours with  Dulcolax suppository pr, may repeat again in 4 more hours as needed. Seek care if symptoms worsen. Consider daily Miralax and/or Dulcolax if symptoms persist.   Can mix Miralax and benefiber together in liquid once or twice a day  For pain  Tylenol/Acetaminophen 500 mg tabs, 1 tab twice daily, lidocaine gel to affected area and then the hydrocodone as needed twicer daily Back Pain, Adult Back pain is very common. The pain often gets better over time. The cause of back pain is usually not dangerous. Most people can learn to manage their back pain on their own. Follow these instructions at home: Watch your back pain for any changes. The following actions may help to lessen any pain you are feeling:  Stay active. Start with short walks on flat ground if you can. Try to walk farther each day.  Exercise regularly as told by your doctor. Exercise helps your back heal faster. It also helps avoid future injury by keeping your muscles strong and flexible.  Do not sit, drive, or stand in one place for more than 30 minutes.  Do not stay in bed. Resting more than 1-2 days can slow down your recovery.  Be careful when you bend or lift an object. Use good form when lifting: ? Bend at your knees. ? Keep the object close to your body. ? Do not twist.  Sleep on a firm mattress. Lie on your side, and bend your knees. If you lie on your back, put a pillow under your knees.  Take medicines only as told by your doctor.  Put ice on the injured area. ? Put ice in a plastic bag. ? Place a towel between your skin and the bag. ? Leave the ice on for 20 minutes, 2-3 times a day for the first 2-3 days. After that, you can switch between ice and heat packs.  Avoid feeling anxious or stressed. Find good ways  to deal with stress, such as exercise.  Maintain a healthy weight. Extra weight puts stress on your back.  Contact a doctor if:  You have pain that does not go away with rest or medicine.  You have worsening pain that goes down into your legs or buttocks.  You have pain that does not get better in one week.  You have pain at night.  You lose weight.  You have a fever or chills. Get help right away if:  You cannot control when you poop (bowel movement) or pee (urinate).  Your arms or legs feel weak.  Your arms or legs lose feeling (numbness).  You feel sick to your stomach (nauseous) or throw up (vomit).  You have belly (abdominal) pain.  You feel like you may pass out (faint). This information is not intended to replace advice given to you by your health care provider. Make sure you discuss any questions you have with your health care provider. Document Released: 04/10/2008 Document Revised: 03/30/2016 Document Reviewed: 02/24/2014 Elsevier Interactive Patient Education  [employer].

## 2017-12-06 NOTE — Assessment & Plan Note (Signed)
Well controlled, no changes to meds. Encouraged heart healthy diet such as the DASH diet and exercise as tolerated.  °

## 2017-12-06 NOTE — Assessment & Plan Note (Signed)
Check cmp and [id number]

## 2017-12-06 NOTE — Assessment & Plan Note (Signed)
Tolerating statin, encouraged heart healthy diet, avoid trans fats, minimize simple carbs and saturated fats. Increase exercise as tolerated 

## 2017-12-06 NOTE — Assessment & Plan Note (Addendum)
Cbc today reveals it is up again likely secondary to his recent fall repeat CBC and reevaluate

## 2017-12-07 LAB — COMPREHENSIVE METABOLIC PANEL
ALBUMIN: 4.2 g/dL (ref 3.5–5.2)
ALK PHOS: 65 U/L (ref 39–117)
ALT: 15 U/L (ref 0–53)
AST: 17 U/L (ref 0–37)
BUN: 27 mg/dL — ABNORMAL HIGH (ref 6–23)
CO2: 23 mEq/L (ref 19–32)
Calcium: 9.4 mg/dL (ref 8.4–10.5)
Chloride: 102 mEq/L (ref 96–112)
Creatinine, Ser: 1.33 mg/dL (ref 0.40–1.50)
GFR: 53.97 mL/min — AB (ref >60.00)
Glucose, Bld: 84 mg/dL (ref 70–99)
POTASSIUM: 4.9 meq/L (ref 3.5–5.1)
Sodium: 137 mEq/L (ref 135–145)
TOTAL PROTEIN: 7.8 g/dL (ref 6.0–8.3)
Total Bilirubin: 0.4 mg/dL (ref 0.2–1.2)

## 2017-12-07 LAB — LIPID PANEL
CHOLESTEROL: 126 mg/dL (ref 0–200)
HDL: 40.8 mg/dL (ref >39.00)
LDL Cholesterol: 47 mg/dL (ref 0–99)
NonHDL: 85.48
Total CHOL/HDL Ratio: 3
Triglycerides: 192 mg/dL — ABNORMAL HIGH (ref 0.0–149.0)
VLDL: 38.4 mg/dL (ref 0.0–40.0)

## 2017-12-07 LAB — CBC
HEMATOCRIT: 42.5 % (ref 39.0–52.0)
HEMOGLOBIN: 13.9 g/dL (ref 13.0–17.0)
MCHC: 32.7 g/dL (ref 30.0–36.0)
MCV: 91.7 fl (ref 78.0–100.0)
PLATELETS: 358 10*3/uL (ref 150.0–400.0)
RBC: 4.63 Mil/uL (ref 4.22–5.81)
RDW: 14.7 % (ref 11.5–15.5)
WBC: 15.6 10*3/uL — AB (ref 4.0–10.5)

## 2017-12-07 LAB — HEMOGLOBIN A1C: Hgb A1c MFr Bld: 6.2 % (ref 4.6–6.5)

## 2017-12-07 LAB — TSH: TSH: 3.64 u[IU]/mL (ref 0.35–4.50)

## 2017-12-09 NOTE — Progress Notes (Signed)
Patient ID: Troy Ray, male   DOB: Sep 16, 1928, 84 y.o.   MRN: [id number]   Subjective:    Patient ID: Troy Ray, male    DOB: 20-Dec-1927, 84 y.o.   MRN: [id number]  Chief Complaint  Patient presents with  . Fracture of thoracic vertebra    6 week follow up    HPI Patient is in today for follow up accompanied by his wife. His thoracic and abdominal pain are much better s/p his kyphoplasty. Unfortunately he had a fall at home this week onto his right side and has had low back pain every since. No incontinence or radiculopathy. Denies CP/palp/SOB/HA/congestion/fevers/GI or GU c/o. Taking meds as prescribed  Past Medical History:  Diagnosis Date  . Adenomatous colon polyp   . BPH (benign prostatic hyperplasia) 07/14/2013  . CAD (coronary artery disease)   . Carotid stenosis, bilateral    right 60-79% stenosed.  Left 40-59%  . Chronic kidney disease    chronic, stage II  . CRI (chronic renal insufficiency)   . Diverticulosis   . Dysphagia   . Esophageal stricture    Dr [name]  . Glaucoma   . Headache(784.0) 12/01/2013  . Hyperlipidemia   . Hypertension   . Insomnia 05/12/2015  . Low back pain syndrome 04/03/2014  . Medicare annual wellness visit, subsequent 02/04/2015  . Memory loss   . Postural lightheadedness   . PVD (peripheral vascular disease) ([place])   . Renal insufficiency   . Sensorineural hearing loss, bilateral   . Unspecified constipation 12/29/2012    Past Surgical History:  Procedure Laterality Date  . ABDOMINAL AORTIC ANEURYSM REPAIR  1996   Dr [name]  . CORONARY ARTERY BYPASS GRAFT    . EYE SURGERY     cataracts b/l and laser  . HERNIA REPAIR  03/23/05   left inguinal Lichtenstein repair, with mesh.  [name]  . IR [facility] THORACIC WITH BONE BIOPSY  11/08/2017  . IR RADIOLOGIST EVAL & MGMT  10/22/2017  . RENAL ARTERY STENT  1996   Dr [name].  Bilateral stenting    Family History  Problem Relation Age of Onset  . Heart disease Father        CAD    . Coronary artery disease Father   . Stroke Father   . Hearing loss Father   . Hearing loss Paternal Grandfather   . Heart disease Son   . Heart disease Brother        MI  . Cancer Neg Hx   . Diabetes Neg Hx     Social History   Socioeconomic History  . Marital status: Married    Spouse name: Not on file  . Number of children: 3  . Years of education: Not on file  . Highest education level: Not on file  Social Needs  . Financial resource strain: Not on file  . Food insecurity - worry: Not on file  . Food insecurity - inability: Not on file  . Transportation needs - medical: Not on file  . Transportation needs - non-medical: Not on file  Occupational History  . Occupation: MEDIA ASSISTANT    Employer:  [employer]  Tobacco Use  . Smoking status: Former Research scientist (life sciences)  . Smokeless tobacco: Never Used  . Tobacco comment: quit 1980  Substance and Sexual Activity  . Alcohol use: Yes    Alcohol/week: 0.6 oz    Types: 1 Glasses of wine per week  . Drug use: No  . Sexual  activity: Yes    Comment: lives with wife, retired, no dietary restriction  Other Topics Concern  . Not on file  Social History Narrative  . Not on file    Outpatient Medications Prior to Visit  Medication Sig Dispense Refill  . amLODipine (NORVASC) 5 MG tablet TAKE 1 TABLET TWICE DAILY 180 tablet 1  . aspirin EC 81 MG EC tablet Take 81 mg by mouth at bedtime.     [name] Kitchen atorvastatin (LIPITOR) 40 MG tablet TAKE 1 TABLET EVERY DAY 90 tablet 0  . finasteride (PROSCAR) 5 MG tablet TAKE 1 TABLET EVERY DAY 90 tablet 1  . Hydrocodone-Acetaminophen 2.5-325 MG TABS Take 1 tablet by mouth 2 (two) times daily as needed. 30 tablet 0  . latanoprost (XALATAN) 0.005 % ophthalmic solution Place 1 drop into both eyes at bedtime.    [name] Kitchen losartan (COZAAR) 50 MG tablet Take 1 tablet (50 mg total) by mouth 2 (two) times daily. 180 tablet 2  . metoprolol succinate (TOPROL-XL) 25 MG 24 hr tablet TAKE 1 TABLET EVERY DAY WITH SUPPER  90 tablet 2  . Multiple Vitamins-Minerals (MULTIVITAMIN WITH MINERALS) tablet Take 1 tablet by mouth daily.    . pantoprazole (PROTONIX) 40 MG tablet Take 1 tablet (40 mg total) by mouth daily. 90 tablet 2  . timolol (TIMOPTIC) 0.5 % ophthalmic solution Place 1 drop into both eyes daily.      No facility-administered medications prior to visit.     No Known Allergies  Review of Systems  Constitutional: Negative for fever and malaise/fatigue.  HENT: Negative for congestion.   Eyes: Negative for blurred vision.  Respiratory: Negative for shortness of breath.   Cardiovascular: Negative for chest pain, palpitations and leg swelling.  Gastrointestinal: Negative for abdominal pain, blood in stool and nausea.  Genitourinary: Negative for dysuria and frequency.  Musculoskeletal: Positive for back pain and falls.  Skin: Negative for rash.  Neurological: Negative for dizziness, loss of consciousness and headaches.  Endo/Heme/Allergies: Negative for environmental allergies.  Psychiatric/Behavioral: Negative for depression. The patient is not nervous/anxious.        Objective:    Physical Exam  Constitutional: He is oriented to person, place, and time. He appears well-developed and well-nourished. No distress.  HENT:  Head: Normocephalic and atraumatic.  Nose: Nose normal.  Eyes: Right eye exhibits no discharge. Left eye exhibits no discharge.  Neck: Normal range of motion. Neck supple.  Cardiovascular: Normal rate and regular rhythm.  No murmur heard. Pulmonary/Chest: Effort normal and breath sounds normal.  Abdominal: Soft. Bowel sounds are normal. There is no tenderness.  Musculoskeletal: He exhibits no edema.  Neurological: He is alert and oriented to person, place, and time.  Skin: Skin is warm and dry.  Psychiatric: He has a normal mood and affect.  Nursing note and vitals reviewed.   BP 128/64 (BP Location: Left Arm, Patient Position: Standing, Cuff Size: Normal)   Pulse 63    Temp 97.6 F (36.4 C) (Oral)   Resp 16   Ht 5\' 9"  (1.753 m)   Wt 159 lb 9.6 oz (72.4 kg)   SpO2 94%   BMI 23.57 kg/m  Wt Readings from Last 3 Encounters:  12/06/17 159 lb 9.6 oz (72.4 kg)  11/08/17 155 lb (70.3 kg)  10/25/17 156 lb 12.8 oz (71.1 kg)     Lab Results  Component Value Date   WBC 15.6 (H) 12/06/2017   HGB 13.9 12/06/2017   HCT 42.5 12/06/2017   PLT 358.0 12/06/2017  GLUCOSE 84 12/06/2017   CHOL 126 12/06/2017   TRIG 192.0 (H) 12/06/2017   HDL 40.80 12/06/2017   LDLCALC 47 12/06/2017   ALT 15 12/06/2017   AST 17 12/06/2017   NA 137 12/06/2017   K 4.9 12/06/2017   CL 102 12/06/2017   CREATININE 1.33 12/06/2017   BUN 27 (H) 12/06/2017   CO2 23 12/06/2017   TSH 3.64 12/06/2017   PSA 0.75 10/06/2013   INR 1.08 11/08/2017   HGBA1C 6.2 12/06/2017   MICROALBUR 0.87 10/06/2013    Lab Results  Component Value Date   TSH 3.64 12/06/2017   Lab Results  Component Value Date   WBC 15.6 (H) 12/06/2017   HGB 13.9 12/06/2017   HCT 42.5 12/06/2017   MCV 91.7 12/06/2017   PLT 358.0 12/06/2017   Lab Results  Component Value Date   NA 137 12/06/2017   K 4.9 12/06/2017   CO2 23 12/06/2017   GLUCOSE 84 12/06/2017   BUN 27 (H) 12/06/2017   CREATININE 1.33 12/06/2017   BILITOT 0.4 12/06/2017   ALKPHOS 65 12/06/2017   AST 17 12/06/2017   ALT 15 12/06/2017   PROT 7.8 12/06/2017   ALBUMIN 4.2 12/06/2017   CALCIUM 9.4 12/06/2017   ANIONGAP 8 11/08/2017   GFR 53.97 (L) 12/06/2017   Lab Results  Component Value Date   CHOL 126 12/06/2017   Lab Results  Component Value Date   HDL 40.80 12/06/2017   Lab Results  Component Value Date   LDLCALC 47 12/06/2017   Lab Results  Component Value Date   TRIG 192.0 (H) 12/06/2017   Lab Results  Component Value Date   CHOLHDL 3 12/06/2017   Lab Results  Component Value Date   HGBA1C 6.2 12/06/2017       Assessment & Plan:   Problem List Items Addressed This Visit    Hyperlipidemia, mixed     Tolerating statin, encouraged heart healthy diet, avoid trans fats, minimize simple carbs and saturated fats. Increase exercise as tolerated      Relevant Orders   Lipid panel (Completed)   Essential hypertension    Well controlled, no changes to meds. Encouraged heart healthy diet such as the DASH diet and exercise as tolerated.       Relevant Orders   Comprehensive metabolic panel (Completed)   TSH (Completed)   Hyperglycemia    Check cmp and hgba1c      Relevant Orders   Hemoglobin A1c (Completed)   Constipation    Encouraged increased hydration and fiber in diet. Daily probiotics. If bowels not moving can use MOM 2 tbls po in 4 oz of warm prune juice by mouth every 2-3 days. If no results then repeat in 4 hours with  Dulcolax suppository pr, may repeat again in 4 more hours as needed. Seek care if symptoms worsen. Consider daily Miralax and/or Dulcolax if symptoms persist.       Low back pain    Fell onto his right side and has had low back pain since then, no incontinence or radiculopathy but xray confirms sacral, coccygeal freacture. Proceed with MRI and referral to neurosurgery if patient in agreement      Leukocytosis    Cbc today reveals it is up again likely secondary to his recent fall repeat CBC and reevaluate      Relevant Orders   CBC (Completed)    Other Visit Diagnoses    Pathological fracture of vertebra, unspecified pathological cause, sequela    -  Primary   Relevant Orders   DG Sacrum/Coccyx (Completed)   DG Bone Density   Sacral back pain       Relevant Orders   DG Sacrum/Coccyx (Completed)      I am having Troy Ray maintain his aspirin EC, timolol, atorvastatin, losartan, amLODipine, finasteride, Hydrocodone-Acetaminophen, metoprolol succinate, pantoprazole, latanoprost, and multivitamin with minerals.  No orders of the defined types were placed in this encounter.   Penni Homans, MD

## 2017-12-09 NOTE — Assessment & Plan Note (Signed)
Fell onto his right side and has had low back pain since then, no incontinence or radiculopathy but xray confirms sacral, coccygeal freacture. Proceed with MRI and referral to neurosurgery if patient in agreement

## 2017-12-09 NOTE — Assessment & Plan Note (Signed)
Encouraged increased hydration and fiber in diet. Daily probiotics. If bowels not moving can use MOM 2 tbls po in 4 oz of warm prune juice by mouth every 2-3 days. If no results then repeat in 4 hours with  Dulcolax suppository pr, may repeat again in 4 more hours as needed. Seek care if symptoms worsen. Consider daily Miralax and/or Dulcolax if symptoms persist.  

## 2017-12-11 ENCOUNTER — Ambulatory Visit (HOSPITAL_BASED_OUTPATIENT_CLINIC_OR_DEPARTMENT_OTHER)
Admission: RE | Admit: 2017-12-11 | Discharge: 2017-12-11 | Disposition: A | Discharge status: Home / Self Care | Payer: Medicare HMO | Source: Ambulatory Visit | Attending: Family Medicine | Admitting: Family Medicine

## 2017-12-11 ENCOUNTER — Other Ambulatory Visit: Payer: Self-pay | Admitting: Family Medicine

## 2017-12-11 ENCOUNTER — Other Ambulatory Visit (INDEPENDENT_AMBULATORY_CARE_PROVIDER_SITE_OTHER): Payer: Medicare HMO

## 2017-12-11 DIAGNOSIS — I10 Essential (primary) hypertension: Secondary | ICD-10-CM

## 2017-12-11 DIAGNOSIS — D72829 Elevated white blood cell count, unspecified: Secondary | ICD-10-CM | POA: Diagnosis not present

## 2017-12-11 DIAGNOSIS — M8448XS Pathological fracture, other site, sequela: Secondary | ICD-10-CM | POA: Insufficient documentation

## 2017-12-11 DIAGNOSIS — Z13820 Encounter for screening for osteoporosis: Secondary | ICD-10-CM | POA: Insufficient documentation

## 2017-12-11 DIAGNOSIS — M8580 Other specified disorders of bone density and structure, unspecified site: Secondary | ICD-10-CM | POA: Insufficient documentation

## 2017-12-11 DIAGNOSIS — M85852 Other specified disorders of bone density and structure, left thigh: Secondary | ICD-10-CM | POA: Diagnosis not present

## 2017-12-11 LAB — CBC WITH DIFFERENTIAL/PLATELET
BASOS ABS: 0.1 10*3/uL (ref 0.0–0.1)
Basophils Relative: 0.4 % (ref 0.0–3.0)
EOS ABS: 0.5 10*3/uL (ref 0.0–0.7)
Eosinophils Relative: 3.6 % (ref 0.0–5.0)
HEMATOCRIT: 39.6 % (ref 39.0–52.0)
HEMOGLOBIN: 13 g/dL (ref 13.0–17.0)
LYMPHS PCT: 21.1 % (ref 12.0–46.0)
Lymphs Abs: 3.1 10*3/uL (ref 0.7–4.0)
MCHC: 32.7 g/dL (ref 30.0–36.0)
MCV: 91.3 fl (ref 78.0–100.0)
MONOS PCT: 7.2 % (ref 3.0–12.0)
Monocytes Absolute: 1.1 10*3/uL — ABNORMAL HIGH (ref 0.1–1.0)
NEUTROS ABS: 10 10*3/uL — AB (ref 1.4–7.7)
Neutrophils Relative %: 67.7 % (ref 43.0–77.0)
PLATELETS: 320 10*3/uL (ref 150.0–400.0)
RBC: 4.34 Mil/uL (ref 4.22–5.81)
RDW: 14.8 % (ref 11.5–15.5)
WBC: 14.8 10*3/uL — AB (ref 4.0–10.5)

## 2017-12-11 LAB — SEDIMENTATION RATE: SED RATE: 19 mm/h (ref 0–20)

## 2017-12-12 LAB — URINE CULTURE
MICRO NUMBER: 90153983
SPECIMEN QUALITY:: ADEQUATE

## 2017-12-14 ENCOUNTER — Telehealth: Payer: Self-pay

## 2017-12-14 MED ORDER — ALENDRONATE SODIUM 70 MG PO TABS: 70.0000 mg | ORAL_TABLET | ORAL | 4 refills | Status: DC

## 2017-12-14 NOTE — Telephone Encounter (Signed)
Spoke with wife who wrote everything down/will have Damain start Fosamax; Ca; Vit-Sitlali Koerner/will revisit at OV on 5.2.19 with PCP/sent in Fosamax as instructed/thx dmf

## 2017-12-14 NOTE — Telephone Encounter (Signed)
-----   Message from [name], MD sent at 12/11/2017  5:27 PM EST ----- Bone density shows osteopenia, which is thinner than normal but not as bad as osteoporosis but with his fractures mixed with the osteopenia we will categorize him as osteoporosis and recommend Fosamax 70 mg po q week disp #4 with 4 rf. To help strengthen his bones. Recommend calcium intake of 1200 to 1500 mg daily, divided into roughly 3 doses. Best source is the diet and a single dairy serving is about 500 mg, a supplement of calcium citrate once or twice daily to balance diet is fine if not getting enough in diet. Also need Vitamin Samariah Hokenson 2000 IU caps, 1 cap daily if not already taking vitamin Loucille Takach. Also recommend weight baring exercise on hips and upper body to keep bones strong

## 2018-02-19 ENCOUNTER — Other Ambulatory Visit: Payer: Self-pay | Admitting: Family Medicine

## 2018-02-19 DIAGNOSIS — I10 Essential (primary) hypertension: Secondary | ICD-10-CM

## 2018-03-01 ENCOUNTER — Other Ambulatory Visit: Payer: Self-pay | Admitting: Family Medicine

## 2018-03-05 NOTE — Telephone Encounter (Signed)
Notified pt's spouse that Rx has been sent and I can send Rx to local pharmacy for a 2 week supply. She states it usually ships to pt within 5 or days and she thinks he will be ok until he receives the mail order supply.

## 2018-03-05 NOTE — Telephone Encounter (Signed)
Pt's wife calling checking on status of medication. He only has 4 left. Wife has same script and wants to know if it is ok for him to take hers until he gets his in the mail.

## 2018-03-07 ENCOUNTER — Encounter: Payer: Self-pay | Admitting: Family Medicine

## 2018-03-07 ENCOUNTER — Ambulatory Visit: Payer: Medicare HMO | Admitting: Family Medicine

## 2018-03-19 NOTE — Progress Notes (Addendum)
Subjective:   Troy Ray is a 84 y.o. male who presents for Medicare Annual/Subsequent preventive examination.  Review of Systems: No ROS.  Medicare Wellness Visit. Additional risk factors are reflected in the social history.  Cardiac Risk Factors include: advanced age (>20men, >28 women);dyslipidemia;hypertension;male gender Sleep patterns: Sleeps about 6 hrs. Wakes about 3 times to urinate. Home Safety/Smoke Alarms: Feels safe in home. Smoke alarms in place.  Living environment; residence and Firearm Safety: Lives in 1 story home with wife. Walk in shower with bench and grab bars.  Male:   CCS- No longer doing routine screening due to age. PSA-  Lab Results  Component Value Date   PSA 0.75 10/06/2013   PSA 1.21 09/15/2010       Objective:    Vitals: BP (!) 120/58 (BP Location: Left Arm, Patient Position: Sitting, Cuff Size: Normal)   Pulse 64   Ht 5\' 9"  (1.753 m)   Wt 156 lb 12.8 oz (71.1 kg)   SpO2 98%   BMI 23.16 kg/m   Body mass index is 23.16 kg/m.  Advanced Directives 03/21/2018 11/08/2017 04/12/2017 03/19/2017 02/16/2017 03/27/2016 02/01/2016  Does Patient Have a Medical Advance Directive? Yes Yes Yes Yes Yes Yes Yes  Type of Paramedic of [place];Living will [place];Living will Living will Living will Living will [place];Living will [place];Living will  Does patient want to make changes to medical advance directive? No - Patient declined No - Patient declined - - - - No - Patient declined  Copy of [place] in Chart? Yes No - copy requested - - - - No - copy requested    Tobacco Social History   Tobacco Use  Smoking Status Former Smoker  Smokeless Tobacco Never Used  Tobacco Comment   quit 1980     Counseling given: Not Answered Comment: quit 1980   Clinical Intake: Pain : No/denies pain     Past Medical History:  Diagnosis Date  . Adenomatous  colon polyp   . BPH (benign prostatic hyperplasia) 07/14/2013  . CAD (coronary artery disease)   . Carotid stenosis, bilateral    right 60-79% stenosed.  Left 40-59%  . Chronic kidney disease    chronic, stage II  . CRI (chronic renal insufficiency)   . Diverticulosis   . Dysphagia   . Esophageal stricture    Dr [name]  . Glaucoma   . Headache(784.0) 12/01/2013  . Hyperlipidemia   . Hypertension   . Insomnia 05/12/2015  . Low back pain syndrome 04/03/2014  . Medicare annual wellness visit, subsequent 02/04/2015  . Memory loss   . Postural lightheadedness   . PVD (peripheral vascular disease) ([place])   . Renal insufficiency   . Sensorineural hearing loss, bilateral   . Unspecified constipation 12/29/2012   Past Surgical History:  Procedure Laterality Date  . ABDOMINAL AORTIC ANEURYSM REPAIR  1996   Dr [name]  . CORONARY ARTERY BYPASS GRAFT    . EYE SURGERY     cataracts b/l and laser  . HERNIA REPAIR  03/23/05   left inguinal Lichtenstein repair, with mesh.  [name]  . IR [facility] THORACIC WITH BONE BIOPSY  11/08/2017  . IR RADIOLOGIST EVAL & MGMT  10/22/2017  . RENAL ARTERY STENT  1996   Dr [name].  Bilateral stenting   Family History  Problem Relation Age of Onset  . Heart disease Father  CAD  . Coronary artery disease Father   . Stroke Father   . Hearing loss Father   . Hearing loss Paternal Grandfather   . Heart disease Son   . Heart disease Brother        MI  . Cancer Neg Hx   . Diabetes Neg Hx    Social History   Socioeconomic History  . Marital status: Married    Spouse name: Not on file  . Number of children: 3  . Years of education: Not on file  . Highest education level: Not on file  Occupational History  . Occupation: MEDIA ASSISTANT    Employer: [employer]  Social Needs  . Financial resource strain: Not on file  . Food insecurity:    Worry: Not on file    Inability: Not on file  . Transportation needs:    Medical: Not on file      Non-medical: Not on file  Tobacco Use  . Smoking status: Former Research scientist (life sciences)  . Smokeless tobacco: Never Used  . Tobacco comment: quit 1980  Substance and Sexual Activity  . Alcohol use: Yes    Alcohol/week: 0.6 oz    Types: 1 Glasses of wine per week  . Drug use: No  . Sexual activity: Yes    Comment: lives with wife, retired, no dietary restriction  Lifestyle  . Physical activity:    Days per week: Not on file    Minutes per session: Not on file  . Stress: Not on file  Relationships  . Social connections:    Talks on phone: Not on file    Gets together: Not on file    Attends religious service: Not on file    Active member of club or organization: Not on file    Attends meetings of clubs or organizations: Not on file    Relationship status: Not on file  Other Topics Concern  . Not on file  Social History Narrative  . Not on file    Outpatient Encounter Medications as of 03/21/2018  Medication Sig  . amLODipine (NORVASC) 5 MG tablet TAKE 1 TABLET TWICE DAILY  . aspirin EC 81 MG EC tablet Take 81 mg by mouth at bedtime.   [name] Kitchen atorvastatin (LIPITOR) 40 MG tablet TAKE 1 TABLET EVERY DAY  . finasteride (PROSCAR) 5 MG tablet TAKE 1 TABLET EVERY DAY  . latanoprost (XALATAN) 0.005 % ophthalmic solution Place 1 drop into both eyes at bedtime.  [name] Kitchen losartan (COZAAR) 50 MG tablet Take 1 tablet (50 mg total) by mouth 2 (two) times daily.  . metoprolol succinate (TOPROL-XL) 25 MG 24 hr tablet TAKE 1 TABLET EVERY DAY WITH SUPPER  . pantoprazole (PROTONIX) 40 MG tablet Take 1 tablet (40 mg total) by mouth daily.  . timolol (TIMOPTIC) 0.5 % ophthalmic solution Place 1 drop into both eyes daily.   [name] Kitchen alendronate (FOSAMAX) 70 MG tablet Take 1 tablet (70 mg total) by mouth every 7 (seven) days. Take with a full glass of water on an empty stomach. (Patient not taking: Reported on 03/21/2018)  . Hydrocodone-Acetaminophen 2.5-325 MG TABS Take 1 tablet by mouth 2 (two) times daily as needed. (Patient  not taking: Reported on 03/21/2018)  . Multiple Vitamins-Minerals (MULTIVITAMIN WITH MINERALS) tablet Take 1 tablet by mouth daily.   No facility-administered encounter medications on file as of 03/21/2018.     Activities of Daily Living In your present state of health, do you have any difficulty performing the following activities: 03/21/2018  11/08/2017  Hearing? Y N  Comment wears left hearing. cant hear at all out of right ear. -  Vision? N N  Comment wearing glasses. -  Difficulty concentrating or making decisions? N N  Walking or climbing stairs? Y N  Comment uses cane. off balance. -  Dressing or bathing? N N  Doing errands, shopping? Y -  Comment still drives. Does not generally drive alone. -  Preparing Food and eating ? N -  Using the Toilet? N -  In the past six months, have you accidently leaked urine? N -  Do you have problems with loss of bowel control? N -  Managing your Medications? N -  Managing your Finances? N -  Housekeeping or managing your Housekeeping? N -  Some recent data might be hidden    Patient Care Team: [name], MD as PCP - General (Family Medicine) Calvert Cantor, MD as Consulting Physician (Ophthalmology)   Assessment:   This is a routine wellness examination for Troy Ray. Physical assessment deferred to PCP.  Exercise Activities and Dietary recommendations Current Exercise Habits: The patient does not participate in regular exercise at present, Exercise limited by: None identified(age, imbalance) Diet (meal preparation, eat out, water intake, caffeinated beverages, dairy products, fruits and vegetables): well balanced   Goals    . Maintain health and independence (pt-stated)       Fall Risk Fall Risk  03/21/2018 03/19/2017 02/01/2016 01/25/2015 10/10/2013  Falls in the past year? Yes No No No No  Number falls in past yr: 2 or more - - - -  Injury with Fall? Yes - - - -  Risk for fall due to : Impaired balance/gait - - - -  Follow up  Education provided;Falls prevention discussed - - - -    Depression Screen PHQ 2/9 Scores 03/21/2018 03/19/2017 02/01/2016 01/25/2015  PHQ - 2 Score 0 0 0 0    Cognitive Function MMSE - Mini Mental State Exam 03/21/2018 03/19/2017  Orientation to time 5 5  Orientation to Place 5 5  Registration 3 3  Attention/ Calculation 5 4  Recall 3 2  Language- name 2 objects 2 2  Language- repeat 1 1  Language- follow 3 step command 3 3  Language- read & follow direction 1 1  Write a sentence 1 1  Copy design 1 1  Total score 30 28        Immunization History  Administered Date(s) Administered  . Influenza Whole 08/17/2008, 09/20/2009, 08/23/2010  . Influenza, High Dose Seasonal PF 07/29/2015, 06/29/2016, 08/16/2017  . Influenza,inj,Quad PF,6+ Mos 07/11/2013, 08/13/2014  . Pneumococcal Conjugate-13 08/13/2014  . Pneumococcal Polysaccharide-23 11/20/2006, 09/30/2012  . Zoster 09/22/2010    Screening Tests Health Maintenance  Topic Date Due  . TETANUS/TDAP  03/21/1947  . INFLUENZA VACCINE  06/06/2018  . PNA vac Low Risk Adult  Completed      Plan:    Please schedule your next medicare wellness visit with me in 1 yr.  Continue to eat heart healthy diet (full of fruits, vegetables, whole grains, lean protein, water--limit salt, fat, and sugar intake) and increase physical activity as tolerated.  Continue doing brain stimulating activities (puzzles, reading, adult coloring books, staying active) to keep memory sharp.     I have personally reviewed and noted the following in the patient's chart:   . Medical and social history . Use of alcohol, tobacco or illicit drugs  . Current medications and supplements . Functional ability and status .  Nutritional status . Physical activity . Advanced directives . List of other physicians . Hospitalizations, surgeries, and ER visits in previous 12 months . Vitals . Screenings to include cognitive, depression, and falls . Referrals and  appointments  In addition, I have reviewed and discussed with patient certain preventive protocols, quality metrics, and best practice recommendations. A written personalized care plan for preventive services as well as general preventive health recommendations were provided to patient.     Shela Nevin, [place]  03/21/2018  Medical screening examination/treatment was performed by qualified clinical staff member and as supervising physician I was immediately available for consultation/collaboration. I have reviewed documentation and agree with assessment and plan.  Penni Homans, MD

## 2018-03-21 ENCOUNTER — Encounter: Payer: Self-pay | Admitting: *Deleted

## 2018-03-21 ENCOUNTER — Ambulatory Visit (INDEPENDENT_AMBULATORY_CARE_PROVIDER_SITE_OTHER): Payer: Medicare HMO | Admitting: *Deleted

## 2018-03-21 ENCOUNTER — Ambulatory Visit: Payer: Medicare HMO | Admitting: *Deleted

## 2018-03-21 VITALS — BP 120/58 | HR 64 | Ht 69.0 in | Wt 156.8 lb

## 2018-03-21 DIAGNOSIS — Z0000 Encounter for general adult medical examination without abnormal findings: Secondary | ICD-10-CM | POA: Diagnosis not present

## 2018-03-21 NOTE — Patient Instructions (Signed)
HAPPY BIRTHDAY!!!!!    Please schedule your next medicare wellness visit with me in 1 yr.  Continue to eat heart healthy diet (full of fruits, vegetables, whole grains, lean protein, water--limit salt, fat, and sugar intake) and increase physical activity as tolerated.  Continue doing brain stimulating activities (puzzles, reading, adult coloring books, staying active) to keep memory sharp.    Troy Ray , Thank you for taking time to come for your Medicare Wellness Visit. I appreciate your ongoing commitment to your health goals. Please review the following plan we discussed and let me know if I can assist you in the future.   These are the goals we discussed: Goals    . Maintain health and independence (pt-stated)       This is a list of the screening recommended for you and due dates:  Health Maintenance  Topic Date Due  . Tetanus Vaccine  03/21/1947  . Flu Shot  06/06/2018  . Pneumonia vaccines  Completed

## 2018-04-18 ENCOUNTER — Ambulatory Visit: Payer: Medicare HMO | Admitting: Family Medicine

## 2018-04-22 ENCOUNTER — Ambulatory Visit (INDEPENDENT_AMBULATORY_CARE_PROVIDER_SITE_OTHER): Payer: Medicare HMO | Admitting: Family Medicine

## 2018-04-22 VITALS — BP 164/62 | HR 66 | Temp 98.0°F | Resp 18 | Wt 151.0 lb

## 2018-04-22 DIAGNOSIS — S22050G Wedge compression fracture of T5-T6 vertebra, subsequent encounter for fracture with delayed healing: Secondary | ICD-10-CM

## 2018-04-22 DIAGNOSIS — D72829 Elevated white blood cell count, unspecified: Secondary | ICD-10-CM

## 2018-04-22 DIAGNOSIS — E782 Mixed hyperlipidemia: Secondary | ICD-10-CM

## 2018-04-22 DIAGNOSIS — M545 Low back pain: Secondary | ICD-10-CM | POA: Diagnosis not present

## 2018-04-22 DIAGNOSIS — I10 Essential (primary) hypertension: Secondary | ICD-10-CM | POA: Diagnosis not present

## 2018-04-22 DIAGNOSIS — N182 Chronic kidney disease, stage 2 (mild): Secondary | ICD-10-CM

## 2018-04-22 DIAGNOSIS — R739 Hyperglycemia, unspecified: Secondary | ICD-10-CM

## 2018-04-22 MED ORDER — METOPROLOL SUCCINATE ER 50 MG PO TB24: 50.0000 mg | ORAL_TABLET | Freq: Every day | ORAL | 1 refills | Status: DC

## 2018-04-22 NOTE — Progress Notes (Signed)
Subjective:  I acted as a Education administrator for Dr. Charlett Blake. [place], [place]  Patient ID: OSBORN PULLIN, male    DOB: Dec 30, 1927, 84 y.o.   MRN: [id number]  No chief complaint on file.   HPI  Patient is in today for 3 month follow up and he is accompanied by his wife. He is doing fairly well but he notes that his back pain has begun to worsen again. No falls or trauma. No incontinence or radicular symptoms. No polyuria or polydipsia. Denies CP/palp/SOB/HA/congestion/fevers/GI or GU c/o. Taking meds as prescribed  Patient Care Team: [name], MD as PCP - General (Family Medicine) Calvert Cantor, MD as Consulting Physician (Ophthalmology)   Past Medical History:  Diagnosis Date  . Adenomatous colon polyp   . BPH (benign prostatic hyperplasia) 07/14/2013  . CAD (coronary artery disease)   . Carotid stenosis, bilateral    right 60-79% stenosed.  Left 40-59%  . Chronic kidney disease    chronic, stage II  . CRI (chronic renal insufficiency)   . Diverticulosis   . Dysphagia   . Esophageal stricture    Dr [name]  . Glaucoma   . Headache(784.0) 12/01/2013  . Hyperlipidemia   . Hypertension   . Insomnia 05/12/2015  . Low back pain syndrome 04/03/2014  . Medicare annual wellness visit, subsequent 02/04/2015  . Memory loss   . Postural lightheadedness   . PVD (peripheral vascular disease) ([place])   . Renal insufficiency   . Sensorineural hearing loss, bilateral   . Unspecified constipation 12/29/2012    Past Surgical History:  Procedure Laterality Date  . ABDOMINAL AORTIC ANEURYSM REPAIR  1996   Dr [name]  . CORONARY ARTERY BYPASS GRAFT    . EYE SURGERY     cataracts b/l and laser  . HERNIA REPAIR  03/23/05   left inguinal Lichtenstein repair, with mesh.  [name]  . IR [facility] THORACIC WITH BONE BIOPSY  11/08/2017  . IR RADIOLOGIST EVAL & MGMT  10/22/2017  . RENAL ARTERY STENT  1996   Dr [name].  Bilateral stenting    Family History  Problem Relation Age of Onset  . Heart  disease Father        CAD  . Coronary artery disease Father   . Stroke Father   . Hearing loss Father   . Hearing loss Paternal Grandfather   . Heart disease Son   . Heart disease Brother        MI  . Cancer Neg Hx   . Diabetes Neg Hx     Social History   Socioeconomic History  . Marital status: Married    Spouse name: Not on file  . Number of children: 3  . Years of education: Not on file  . Highest education level: Not on file  Occupational History  . Occupation: MEDIA ASSISTANT    Employer: [employer]  Social Needs  . Financial resource strain: Not on file  . Food insecurity:    Worry: Not on file    Inability: Not on file  . Transportation needs:    Medical: Not on file    Non-medical: Not on file  Tobacco Use  . Smoking status: Former Research scientist (life sciences)  . Smokeless tobacco: Never Used  . Tobacco comment: quit 1980  Substance and Sexual Activity  . Alcohol use: Yes    Alcohol/week: 0.6 oz    Types: 1 Glasses of wine per week  . Drug use: No  . Sexual activity: Yes  Comment: lives with wife, retired, no dietary restriction  Lifestyle  . Physical activity:    Days per week: Not on file    Minutes per session: Not on file  . Stress: Not on file  Relationships  . Social connections:    Talks on phone: Not on file    Gets together: Not on file    Attends religious service: Not on file    Active member of club or organization: Not on file    Attends meetings of clubs or organizations: Not on file    Relationship status: Not on file  . Intimate partner violence:    Fear of current or ex partner: Not on file    Emotionally abused: Not on file    Physically abused: Not on file    Forced sexual activity: Not on file  Other Topics Concern  . Not on file  Social History Narrative  . Not on file    Outpatient Medications Prior to Visit  Medication Sig Dispense Refill  . amLODipine (NORVASC) 5 MG tablet TAKE 1 TABLET TWICE DAILY 180 tablet 1  . aspirin EC  81 MG EC tablet Take 81 mg by mouth at bedtime.     [name] Kitchen atorvastatin (LIPITOR) 40 MG tablet TAKE 1 TABLET EVERY DAY 90 tablet 1  . finasteride (PROSCAR) 5 MG tablet TAKE 1 TABLET EVERY DAY 90 tablet 1  . latanoprost (XALATAN) 0.005 % ophthalmic solution Place 1 drop into both eyes at bedtime.    [name] Kitchen losartan (COZAAR) 50 MG tablet Take 1 tablet (50 mg total) by mouth 2 (two) times daily. 180 tablet 2  . Multiple Vitamins-Minerals (MULTIVITAMIN WITH MINERALS) tablet Take 1 tablet by mouth daily.    . pantoprazole (PROTONIX) 40 MG tablet Take 1 tablet (40 mg total) by mouth daily. 90 tablet 2  . timolol (TIMOPTIC) 0.5 % ophthalmic solution Place 1 drop into both eyes daily.     . metoprolol succinate (TOPROL-XL) 25 MG 24 hr tablet TAKE 1 TABLET EVERY DAY WITH SUPPER 90 tablet 2  . Hydrocodone-Acetaminophen 2.5-325 MG TABS Take 1 tablet by mouth 2 (two) times daily as needed. (Patient not taking: Reported on 03/21/2018) 30 tablet 0  . alendronate (FOSAMAX) 70 MG tablet Take 1 tablet (70 mg total) by mouth every 7 (seven) days. Take with a full glass of water on an empty stomach. (Patient not taking: Reported on 03/21/2018) 4 tablet 4   No facility-administered medications prior to visit.     No Known Allergies  Review of Systems  Constitutional: Positive for malaise/fatigue. Negative for fever.  HENT: Negative for congestion.   Eyes: Negative for blurred vision.  Respiratory: Negative for shortness of breath.   Cardiovascular: Negative for chest pain, palpitations and leg swelling.  Gastrointestinal: Negative for abdominal pain, blood in stool and nausea.  Genitourinary: Negative for dysuria and frequency.  Musculoskeletal: Positive for back pain and joint pain. Negative for falls.  Skin: Negative for rash.  Neurological: Negative for dizziness, loss of consciousness and headaches.  Endo/Heme/Allergies: Negative for environmental allergies.  Psychiatric/Behavioral: Negative for depression. The  patient is not nervous/anxious.        Objective:    Physical Exam  Constitutional: He is oriented to person, place, and time. He appears well-developed and well-nourished. No distress.  HENT:  Head: Normocephalic and atraumatic.  Nose: Nose normal.  Eyes: Right eye exhibits no discharge. Left eye exhibits no discharge.  Neck: Normal range of motion. Neck supple.  Cardiovascular: Normal  rate and regular rhythm.  Pulmonary/Chest: Effort normal and breath sounds normal.  Abdominal: Soft. Bowel sounds are normal. There is no tenderness.  Musculoskeletal: He exhibits no edema.  Neurological: He is alert and oriented to person, place, and time.  Skin: Skin is warm and dry.  Psychiatric: He has a normal mood and affect.  Nursing note and vitals reviewed.   BP (!) 164/62   Pulse 66   Temp 98 F (36.7 C) (Oral)   Resp 18   Wt 151 lb (68.5 kg)   SpO2 99%   BMI 22.30 kg/m  Wt Readings from Last 3 Encounters:  04/22/18 151 lb (68.5 kg)  03/21/18 156 lb 12.8 oz (71.1 kg)  12/06/17 159 lb 9.6 oz (72.4 kg)   BP Readings from Last 3 Encounters:  04/22/18 (!) 164/62  03/21/18 (!) 120/58  12/06/17 128/64     Immunization History  Administered Date(s) Administered  . Influenza Whole 08/17/2008, 09/20/2009, 08/23/2010  . Influenza, High Dose Seasonal PF 07/29/2015, 06/29/2016, 08/16/2017  . Influenza,inj,Quad PF,6+ Mos 07/11/2013, 08/13/2014  . Pneumococcal Conjugate-13 08/13/2014  . Pneumococcal Polysaccharide-23 11/20/2006, 09/30/2012  . Zoster 09/22/2010    Health Maintenance  Topic Date Due  . TETANUS/TDAP  03/21/1949  . INFLUENZA VACCINE  06/06/2018  . PNA vac Low Risk Adult  Completed    Lab Results  Component Value Date   WBC 14.8 (H) 12/11/2017   HGB 13.0 12/11/2017   HCT 39.6 12/11/2017   PLT 320.0 12/11/2017   GLUCOSE 84 12/06/2017   CHOL 126 12/06/2017   TRIG 192.0 (H) 12/06/2017   HDL 40.80 12/06/2017   LDLCALC 47 12/06/2017   ALT 15 12/06/2017   AST 17  12/06/2017   NA 137 12/06/2017   K 4.9 12/06/2017   CL 102 12/06/2017   CREATININE 1.33 12/06/2017   BUN 27 (H) 12/06/2017   CO2 23 12/06/2017   TSH 3.64 12/06/2017   PSA 0.75 10/06/2013   INR 1.08 11/08/2017   HGBA1C 6.2 12/06/2017   MICROALBUR 0.87 10/06/2013    Lab Results  Component Value Date   TSH 3.64 12/06/2017   Lab Results  Component Value Date   WBC 14.8 (H) 12/11/2017   HGB 13.0 12/11/2017   HCT 39.6 12/11/2017   MCV 91.3 12/11/2017   PLT 320.0 12/11/2017   Lab Results  Component Value Date   NA 137 12/06/2017   K 4.9 12/06/2017   CO2 23 12/06/2017   GLUCOSE 84 12/06/2017   BUN 27 (H) 12/06/2017   CREATININE 1.33 12/06/2017   BILITOT 0.4 12/06/2017   ALKPHOS 65 12/06/2017   AST 17 12/06/2017   ALT 15 12/06/2017   PROT 7.8 12/06/2017   ALBUMIN 4.2 12/06/2017   CALCIUM 9.4 12/06/2017   ANIONGAP 8 11/08/2017   GFR 53.97 (L) 12/06/2017   Lab Results  Component Value Date   CHOL 126 12/06/2017   Lab Results  Component Value Date   HDL 40.80 12/06/2017   Lab Results  Component Value Date   LDLCALC 47 12/06/2017   Lab Results  Component Value Date   TRIG 192.0 (H) 12/06/2017   Lab Results  Component Value Date   CHOLHDL 3 12/06/2017   Lab Results  Component Value Date   HGBA1C 6.2 12/06/2017         Assessment & Plan:   Problem List Items Addressed This Visit    Hyperlipidemia, mixed    Encouraged heart healthy diet, increase exercise, avoid trans fats, consider a krill  oil cap daily      Relevant Medications   metoprolol succinate (TOPROL-XL) 50 MG 24 hr tablet   Other Relevant Orders   Lipid panel   Essential hypertension   Relevant Medications   metoprolol succinate (TOPROL-XL) 50 MG 24 hr tablet   Other Relevant Orders   CBC   Comprehensive metabolic panel   TSH   CHRONIC KIDNEY DISEASE STAGE II (MILD)    check cmp      Hyperglycemia    hgba1c acceptable, minimize simple carbs. Increase exercise as tolerated.         Relevant Orders   Comprehensive metabolic panel   Hemoglobin A1c   Hemoglobin A1c   Low back pain    Struggles with daily pain manges with Norco prn manges his ADLs adequately. No concerning side effects. UDS and contract up to date      T6 vertebral fracture ([place])    After vertebroplasty his pain improved significantly but is slowly worsening again. He is not interested in further intervention at this time      Leukocytosis - Primary    Asymptomatic monitor cbc      Relevant Orders   CBC w/Diff      I have discontinued Palmer W. Lipke's metoprolol succinate and alendronate. I am also having him start on metoprolol succinate. Additionally, I am having him maintain his aspirin EC, timolol, losartan, HYDROcodone-Acetaminophen, pantoprazole, latanoprost, multivitamin with minerals, amLODipine, finasteride, and atorvastatin.  Meds ordered this encounter  Medications  . metoprolol succinate (TOPROL-XL) 50 MG 24 hr tablet    Sig: Take 1 tablet (50 mg total) by mouth daily. Take with or immediately following a meal.    Dispense:  90 tablet    Refill:  1    CMA served as scribe during this visit. History, Physical and Plan performed by medical provider. Documentation and orders reviewed and attested to.  Penni Homans, MD

## 2018-04-22 NOTE — Patient Instructions (Addendum)
  Prolia is the osteoporosis medicine that is a shot twice a year at the doctor's office. If you would like to try let us know (not related to Fosamax) Osteoporosis Osteoporosis is the thinning and loss of density in the bones. Osteoporosis makes the bones more brittle, fragile, and likely to break (fracture). Over time, osteoporosis can cause the bones to become so weak that they fracture after a simple fall. The bones most likely to fracture are the bones in the hip, wrist, and spine. What are the causes? The exact cause is not known. What increases the risk? Anyone can develop osteoporosis. You may be at greater risk if you have a family history of the condition or have poor nutrition. You may also have a higher risk if you are:  Male.  24 years old or older.  A smoker.  Not physically active.  White or Asian.  Slender.  What are the signs or symptoms? A fracture might be the first sign of the disease, especially if it results from a fall or injury that would not usually cause a bone to break. Other signs and symptoms include:  Low back and neck pain.  Stooped posture.  Height loss.  How is this diagnosed? To make a diagnosis, your health care provider may:  Take a medical history.  Perform a physical exam.  Order tests, such as: ? A bone mineral density test. ? A dual-energy X-ray absorptiometry test.  How is this treated? The goal of osteoporosis treatment is to strengthen your bones to reduce your risk of a fracture. Treatment may involve:  Making lifestyle changes, such as: ? Eating a diet rich in calcium. ? Doing weight-bearing and muscle-strengthening exercises. ? Stopping tobacco use. ? Limiting alcohol intake.  Taking medicine to slow the process of bone loss or to increase bone density.  Monitoring your levels of calcium and vitamin D.  Follow these instructions at home:  Include calcium and vitamin D in your diet. Calcium is important for bone  health, and vitamin D helps the body absorb calcium.  Perform weight-bearing and muscle-strengthening exercises as directed by your health care provider.  Do not use any tobacco products, including cigarettes, chewing tobacco, and electronic cigarettes. If you need help quitting, ask your health care provider.  Limit your alcohol intake.  Take medicines only as directed by your health care provider.  Keep all follow-up visits as directed by your health care provider. This is important.  Take precautions at home to lower your risk of falling, such as: ? Keeping rooms well lit and clutter free. ? Installing safety rails on stairs. ? Using rubber mats in the bathroom and other areas that are often wet or slippery. Get help right away if: You fall or injure yourself. This information is not intended to replace advice given to you by your health care provider. Make sure you discuss any questions you have with your health care provider. Document Released: 08/02/2005 Document Revised: 03/27/2016 Document Reviewed: 04/02/2014 Elsevier Interactive Patient Education  [employer].

## 2018-04-24 NOTE — Assessment & Plan Note (Signed)
After vertebroplasty his pain improved significantly but is slowly worsening again. He is not interested in further intervention at this time

## 2018-04-24 NOTE — Assessment & Plan Note (Signed)
Encouraged heart healthy diet, increase exercise, avoid trans fats, consider a krill oil cap daily 

## 2018-04-24 NOTE — Assessment & Plan Note (Signed)
hgba1c acceptable, minimize simple carbs. Increase exercise as tolerated.  

## 2018-04-24 NOTE — Assessment & Plan Note (Signed)
Struggles with daily pain manges with Norco prn manges his ADLs adequately. No concerning side effects. UDS and contract up to date

## 2018-04-24 NOTE — Assessment & Plan Note (Signed)
Asymptomatic monitor cbc

## 2018-04-24 NOTE — Assessment & Plan Note (Signed)
check cmp

## 2018-04-29 ENCOUNTER — Telehealth (HOSPITAL_COMMUNITY): Payer: Self-pay

## 2018-04-29 ENCOUNTER — Telehealth: Payer: Self-pay | Admitting: Family Medicine

## 2018-04-29 ENCOUNTER — Other Ambulatory Visit: Payer: Self-pay | Admitting: Family Medicine

## 2018-04-29 DIAGNOSIS — M549 Dorsalgia, unspecified: Secondary | ICD-10-CM

## 2018-04-29 NOTE — Telephone Encounter (Signed)
Please advise 

## 2018-04-29 NOTE — Telephone Encounter (Signed)
Pt's wife called stating that pt is having back pain again. We aren't able to get him in for consult to eval until July 16th. She will see if Dr. Charlett Blake order MRI and we could potentially do the procedure next week if [name] has a new fracture.

## 2018-04-29 NOTE — Telephone Encounter (Signed)
Copied from [place] [id number]. Topic: Quick Communication - See Telephone Encounter >> Apr 29, 2018  2:03 PM Percell Belt A wrote: CRM for notification. See Telephone encounter for: 04/29/18.   Pt wife called in and stated that pt pain has worsened over the weekend and she was told if it did not get better, to call and Dr Charlett Blake would put in a request for a mri  Best number -[phone]

## 2018-04-29 NOTE — Telephone Encounter (Signed)
U have placed orders for MRI

## 2018-05-01 ENCOUNTER — Other Ambulatory Visit: Payer: Self-pay | Admitting: Family Medicine

## 2018-05-01 MED ORDER — ALPRAZOLAM 0.25 MG PO TABS: ORAL_TABLET | ORAL | 0 refills | Status: DC

## 2018-05-04 ENCOUNTER — Ambulatory Visit (HOSPITAL_BASED_OUTPATIENT_CLINIC_OR_DEPARTMENT_OTHER)
Admission: RE | Admit: 2018-05-04 | Discharge: 2018-05-04 | Disposition: A | Discharge status: Home / Self Care | Payer: Medicare HMO | Source: Ambulatory Visit | Attending: Family Medicine | Admitting: Family Medicine

## 2018-05-04 ENCOUNTER — Encounter (HOSPITAL_BASED_OUTPATIENT_CLINIC_OR_DEPARTMENT_OTHER): Payer: Self-pay

## 2018-05-04 DIAGNOSIS — M4854XA Collapsed vertebra, not elsewhere classified, thoracic region, initial encounter for fracture: Secondary | ICD-10-CM | POA: Insufficient documentation

## 2018-05-04 DIAGNOSIS — M549 Dorsalgia, unspecified: Secondary | ICD-10-CM | POA: Insufficient documentation

## 2018-05-04 DIAGNOSIS — M1288 Other specific arthropathies, not elsewhere classified, other specified site: Secondary | ICD-10-CM | POA: Diagnosis not present

## 2018-05-04 DIAGNOSIS — R29890 Loss of height: Secondary | ICD-10-CM | POA: Diagnosis not present

## 2018-05-04 DIAGNOSIS — M5126 Other intervertebral disc displacement, lumbar region: Secondary | ICD-10-CM | POA: Insufficient documentation

## 2018-05-04 DIAGNOSIS — M48061 Spinal stenosis, lumbar region without neurogenic claudication: Secondary | ICD-10-CM | POA: Diagnosis not present

## 2018-05-07 ENCOUNTER — Other Ambulatory Visit: Payer: Self-pay

## 2018-05-07 MED ORDER — METHYLPREDNISOLONE 4 MG PO TABS: ORAL_TABLET | ORAL | 0 refills | Status: DC

## 2018-05-10 ENCOUNTER — Other Ambulatory Visit: Payer: Self-pay | Admitting: Family Medicine

## 2018-05-10 DIAGNOSIS — M549 Dorsalgia, unspecified: Secondary | ICD-10-CM

## 2018-05-10 DIAGNOSIS — M4807 Spinal stenosis, lumbosacral region: Secondary | ICD-10-CM

## 2018-05-22 ENCOUNTER — Ambulatory Visit: Payer: Medicare HMO

## 2018-06-03 ENCOUNTER — Telehealth: Payer: Self-pay

## 2018-06-03 ENCOUNTER — Other Ambulatory Visit (HOSPITAL_COMMUNITY): Payer: Self-pay | Admitting: Interventional Radiology

## 2018-06-03 DIAGNOSIS — S22000D Wedge compression fracture of unspecified thoracic vertebra, subsequent encounter for fracture with routine healing: Secondary | ICD-10-CM | POA: Diagnosis not present

## 2018-06-03 NOTE — Telephone Encounter (Signed)
Copied from [place] [id number]. Topic: General - Other >> Jun 03, 2018  2:56 PM Oneta Rack wrote:  Relation to pt: self  Call back number: [phone]   Reason for call:  Patient was advised by [place] PA that referral should have been sent to Dr. Bo Merino, MD  [phone], please advise    Gwen Please advise

## 2018-06-17 ENCOUNTER — Telehealth: Payer: Self-pay | Admitting: Family Medicine

## 2018-06-17 ENCOUNTER — Other Ambulatory Visit (HOSPITAL_COMMUNITY): Payer: Self-pay | Admitting: Interventional Radiology

## 2018-06-17 DIAGNOSIS — S22050A Wedge compression fracture of T5-T6 vertebra, initial encounter for closed fracture: Secondary | ICD-10-CM

## 2018-06-17 NOTE — Telephone Encounter (Signed)
Pts wife called back and states that [facility] just called back and scheduled an appt for him. She states to please disregard the previous message.

## 2018-06-17 NOTE — Telephone Encounter (Signed)
Copied from [place] [id number]. Topic: Quick Communication - See Telephone Encounter >> Jun 17, 2018 12:22 PM Bea Graff, NT wrote: CRM for notification. See Telephone encounter for: 06/17/18. Pts wife calling and states that her husband has MRIs done on 05/05/18 and he has not been able to have anything done with the results since. He was referred to [facility] and they told them that was not where he needed to go and referred him to [facility] and they are telling them that they are waiting for a referral. She states the doctor there has reviewed the MRI and that he does need the Kyphoplasty-vertebrotlesty procedure done. She would like to see if something can be done to help get him in for this procedure as he is in a lot of pain.

## 2018-06-18 ENCOUNTER — Telehealth: Payer: Self-pay | Admitting: *Deleted

## 2018-06-18 NOTE — Telephone Encounter (Signed)
Received Bone Biopsy Request Form for Clearance/Denial from Dr. [name], MD, request response by 06/25/18; forwarded to provider/SLS 08/13

## 2018-06-24 ENCOUNTER — Other Ambulatory Visit: Payer: Self-pay | Admitting: Radiology

## 2018-06-25 ENCOUNTER — Ambulatory Visit (HOSPITAL_COMMUNITY)
Admission: RE | Admit: 2018-06-25 | Discharge: 2018-06-25 | Disposition: A | Discharge status: Home / Self Care | Payer: Medicare HMO | Source: Ambulatory Visit | Attending: Interventional Radiology | Admitting: Interventional Radiology

## 2018-06-25 ENCOUNTER — Encounter (HOSPITAL_COMMUNITY): Payer: Self-pay

## 2018-06-25 ENCOUNTER — Other Ambulatory Visit (HOSPITAL_COMMUNITY): Payer: Self-pay | Admitting: Interventional Radiology

## 2018-06-25 DIAGNOSIS — Z951 Presence of aortocoronary bypass graft: Secondary | ICD-10-CM | POA: Diagnosis not present

## 2018-06-25 DIAGNOSIS — Z7982 Long term (current) use of aspirin: Secondary | ICD-10-CM | POA: Diagnosis not present

## 2018-06-25 DIAGNOSIS — G4700 Insomnia, unspecified: Secondary | ICD-10-CM | POA: Diagnosis not present

## 2018-06-25 DIAGNOSIS — H409 Unspecified glaucoma: Secondary | ICD-10-CM | POA: Insufficient documentation

## 2018-06-25 DIAGNOSIS — I129 Hypertensive chronic kidney disease with stage 1 through stage 4 chronic kidney disease, or unspecified chronic kidney disease: Secondary | ICD-10-CM | POA: Insufficient documentation

## 2018-06-25 DIAGNOSIS — N400 Enlarged prostate without lower urinary tract symptoms: Secondary | ICD-10-CM | POA: Diagnosis not present

## 2018-06-25 DIAGNOSIS — M4854XA Collapsed vertebra, not elsewhere classified, thoracic region, initial encounter for fracture: Secondary | ICD-10-CM | POA: Diagnosis not present

## 2018-06-25 DIAGNOSIS — E785 Hyperlipidemia, unspecified: Secondary | ICD-10-CM | POA: Insufficient documentation

## 2018-06-25 DIAGNOSIS — I6523 Occlusion and stenosis of bilateral carotid arteries: Secondary | ICD-10-CM | POA: Diagnosis not present

## 2018-06-25 DIAGNOSIS — Z87891 Personal history of nicotine dependence: Secondary | ICD-10-CM | POA: Diagnosis not present

## 2018-06-25 DIAGNOSIS — N182 Chronic kidney disease, stage 2 (mild): Secondary | ICD-10-CM | POA: Insufficient documentation

## 2018-06-25 DIAGNOSIS — S22050A Wedge compression fracture of T5-T6 vertebra, initial encounter for closed fracture: Secondary | ICD-10-CM

## 2018-06-25 DIAGNOSIS — I739 Peripheral vascular disease, unspecified: Secondary | ICD-10-CM | POA: Diagnosis not present

## 2018-06-25 DIAGNOSIS — I2510 Atherosclerotic heart disease of native coronary artery without angina pectoris: Secondary | ICD-10-CM | POA: Diagnosis not present

## 2018-06-25 HISTORY — PX: IR VERTEBROPLASTY CERV/THOR BX INC UNI/BIL INC/INJECT/IMAGING: IMG5515

## 2018-06-25 LAB — CBC
HCT: 42.3 % (ref 39.0–52.0)
Hemoglobin: 13.2 g/dL (ref 13.0–17.0)
MCH: 27.9 pg (ref 26.0–34.0)
MCHC: 31.2 g/dL (ref 30.0–36.0)
MCV: 89.4 fL (ref 78.0–100.0)
PLATELETS: 264 10*3/uL (ref 150–400)
RBC: 4.73 MIL/uL (ref 4.22–5.81)
RDW: 14.3 % (ref 11.5–15.5)
WBC: 13.7 10*3/uL — AB (ref 4.0–10.5)

## 2018-06-25 LAB — DIFFERENTIAL
Abs Immature Granulocytes: 0 10*3/uL (ref 0.0–0.1)
BASOS ABS: 0.1 10*3/uL (ref 0.0–0.1)
BASOS PCT: 1 %
EOS ABS: 0.7 10*3/uL (ref 0.0–0.7)
EOS PCT: 5 %
Immature Granulocytes: 0 %
Lymphocytes Relative: 21 %
Lymphs Abs: 2.7 10*3/uL (ref 0.7–4.0)
MONO ABS: 1 10*3/uL (ref 0.1–1.0)
Monocytes Relative: 8 %
NEUTROS ABS: 8.6 10*3/uL — AB (ref 1.7–7.7)
NEUTROS PCT: 65 %

## 2018-06-25 LAB — BASIC METABOLIC PANEL
Anion gap: 9 (ref 5–15)
BUN: 19 mg/dL (ref 8–23)
CHLORIDE: 104 mmol/L (ref 98–111)
CO2: 24 mmol/L (ref 22–32)
CREATININE: 1.38 mg/dL — AB (ref 0.61–1.24)
Calcium: 9 mg/dL (ref 8.9–10.3)
GFR calc non Af Amer: 44 mL/min — ABNORMAL LOW (ref >60)
GFR, EST AFRICAN AMERICAN: 51 mL/min — AB (ref >60)
Glucose, Bld: 100 mg/dL — ABNORMAL HIGH (ref 70–99)
Potassium: 4.1 mmol/L (ref 3.5–5.1)
SODIUM: 137 mmol/L (ref 135–145)

## 2018-06-25 LAB — PROTIME-INR
INR: 1.06
PROTHROMBIN TIME: 13.7 s (ref 11.4–15.2)

## 2018-06-25 MED ORDER — SODIUM CHLORIDE 0.9 % IV SOLN
INTRAVENOUS | Status: AC | PRN
  Administered 2018-06-25: 75 mL/h via INTRAVENOUS

## 2018-06-25 MED ORDER — FENTANYL CITRATE (PF) 100 MCG/2ML IJ SOLN
INTRAMUSCULAR | Status: AC | PRN
  Administered 2018-06-25 (×3): 25 ug via INTRAVENOUS

## 2018-06-25 MED ORDER — CEFAZOLIN SODIUM-DEXTROSE 2-4 GM/100ML-% IV SOLN
2.0000 g | INTRAVENOUS | Status: AC
  Administered 2018-06-25: 2 g via INTRAVENOUS

## 2018-06-25 MED ORDER — BUPIVACAINE HCL (PF) 0.5 % IJ SOLN
INTRAMUSCULAR | Status: AC | PRN
  Administered 2018-06-25: 20 mL

## 2018-06-25 MED ORDER — HYDROMORPHONE HCL 1 MG/ML IJ SOLN
INTRAMUSCULAR | Status: AC
  Filled 2018-06-25: qty 1

## 2018-06-25 MED ORDER — MIDAZOLAM HCL 2 MG/2ML IJ SOLN
INTRAMUSCULAR | Status: AC | PRN
  Administered 2018-06-25 (×2): 1 mg via INTRAVENOUS

## 2018-06-25 MED ORDER — BUPIVACAINE HCL (PF) 0.5 % IJ SOLN
INTRAMUSCULAR | Status: AC
  Filled 2018-06-25: qty 30

## 2018-06-25 MED ORDER — IOPAMIDOL (ISOVUE-300) INJECTION 61%
INTRAVENOUS | Status: AC
  Administered 2018-06-25: 2 mL
  Filled 2018-06-25: qty 50

## 2018-06-25 MED ORDER — CEFAZOLIN SODIUM-DEXTROSE 2-4 GM/100ML-% IV SOLN
INTRAVENOUS | Status: AC
  Filled 2018-06-25: qty 100

## 2018-06-25 MED ORDER — TOBRAMYCIN SULFATE 1.2 G IJ SOLR
INTRAMUSCULAR | Status: AC | PRN
  Administered 2018-06-25: .01 g via TOPICAL

## 2018-06-25 MED ORDER — SODIUM CHLORIDE 0.9 % IV SOLN: INTRAVENOUS | Status: DC

## 2018-06-25 MED ORDER — GELATIN ABSORBABLE 12-7 MM EX MISC
CUTANEOUS | Status: AC
  Filled 2018-06-25: qty 1

## 2018-06-25 MED ORDER — MIDAZOLAM HCL 2 MG/2ML IJ SOLN
INTRAMUSCULAR | Status: AC
  Filled 2018-06-25: qty 4

## 2018-06-25 MED ORDER — FENTANYL CITRATE (PF) 100 MCG/2ML IJ SOLN
INTRAMUSCULAR | Status: AC
  Filled 2018-06-25: qty 4

## 2018-06-25 MED ORDER — TOBRAMYCIN SULFATE 1.2 G IJ SOLR
INTRAMUSCULAR | Status: AC
  Filled 2018-06-25: qty 1.2

## 2018-06-25 MED ORDER — SODIUM CHLORIDE 0.9 % IV SOLN: INTRAVENOUS | Status: AC

## 2018-06-25 NOTE — Discharge Instructions (Signed)
1.No stooping ,bending or lifting more than 10 lbs for 2 weeks. 2.Use walker to ambulate for 2 weeks. 3.No driving for 2 weeks  4.RTC PRN 2 weeks    Percutaneous Vertebroplasty, Care After These instructions give you information on caring for yourself after your procedure. Your doctor may also give you more specific instructions. Call your doctor if you have any problems or questions after your procedure. Follow these instructions at home:  Take medicine as told by your doctor.  Keep your wound dry and covered for 24 hours or as told by your doctor.  Ask your doctor when you can bathe or shower.  Put an ice pack on your wound. ? Put ice in a plastic bag. ? Place a towel between your skin and the bag. ? Leave the ice on for 15-20 minutes, 3-4 times a day.  Rest in your bed for 24 hours or as told by your doctor.  Return to normal activities as told by your doctor.  Ask your doctor what stretches and exercises you can do.  Do not bend or lift anything heavy as told by your doctor. Contact a doctor if:  Your wound becomes red, puffy (swollen), or tender to the touch.  You are bleeding or leaking fluid from the wound.  You are sick to your stomach (nauseous) or throw up (vomit) for more than 24 hours after the procedure.  Your back pain does not get better.  You have a fever. Get help right away if:  You have bad back pain that comes on suddenly.  You cannot control when you pee (urinate) or poop (bowel movement).  You lose feeling (numbness) or have tingling in your legs or feet, or they become weak.  You have sudden weakness in your arms or legs.  You have shooting pain down your legs.  You have chest pain or a hard time breathing.  You feel dizzy or pass out (faint).  Your vision changes or you cannot talk as you normally do. This information is not intended to replace advice given to you by your health care provider. Make sure you discuss any questions you have  with your health care provider. Document Released: 01/17/2010 Document Revised: 03/30/2016 Document Reviewed: 07/01/2013 Elsevier Interactive Patient Education  2018 [place].  Moderate Conscious Sedation, Adult, Care After These instructions provide you with information about caring for yourself after your procedure. Your health care provider may also give you more specific instructions. Your treatment has been planned according to current medical practices, but problems sometimes occur. Call your health care provider if you have any problems or questions after your procedure. What can I expect after the procedure? After your procedure, it is common:  To feel sleepy for several hours.  To feel clumsy and have poor balance for several hours.  To have poor judgment for several hours.  To vomit if you eat too soon.  Follow these instructions at home: For at least 24 hours after the procedure:   Do not: ? Participate in activities where you could fall or become injured. ? Drive. ? Use heavy machinery. ? Drink alcohol. ? Take sleeping pills or medicines that cause drowsiness. ? Make important decisions or sign legal documents. ? Take care of children on your own.  Rest. Eating and drinking  Follow the diet recommended by your health care provider.  If you vomit: ? Drink water, juice, or soup when you can drink without vomiting. ? Make sure you have little or  no nausea before eating solid foods. General instructions  Have a responsible adult stay with you until you are awake and alert.  Take over-the-counter and prescription medicines only as told by your health care provider.  If you smoke, do not smoke without supervision.  Keep all follow-up visits as told by your health care provider. This is important. Contact a health care provider if:  You keep feeling nauseous or you keep vomiting.  You feel light-headed.  You develop a rash.  You have a fever. Get help  right away if:  You have trouble breathing. This information is not intended to replace advice given to you by your health care provider. Make sure you discuss any questions you have with your health care provider. Document Released: 08/13/2013 Document Revised: 03/27/2016 Document Reviewed: 02/12/2016 Elsevier Interactive Patient Education  [employer].

## 2018-06-25 NOTE — H&P (Signed)
Chief Complaint: Patient was seen in consultation today for Thoracic 5 kyphoplasty/vertebroplasty at the request of  Dr Frederik Pear   Supervising Physician: [name]  Patient Status: [facility] - Out-pt  History of Present Illness: Troy Ray is a 84 y.o. male   Pt suffering with back pain Worsening for weeks Pain meds no real relief  05/04/2018 MRI: IMPRESSION: MR THORACIC SPINE IMPRESSION 1. Superior endplate compression fracture of T5, new since 09/22/2017, with approximately 25% height loss. No retropulsion or stenosis. Moderate edema. 2. Mild progression height loss of chronic T6 compression fracture. 3. Extensive luminal irregularity throughout the thoracic aorta, likely due to heavy burden of atherosclerotic plaque. Though incompletely assessed on this study, there appears to be approximately 70% narrowing of the inferior thoracic aortic lumen  T6 KP performed 11/2017-- pain relief was significant  Scheduled now for T5 KP/VP   Past Medical History:  Diagnosis Date  . Adenomatous colon polyp   . BPH (benign prostatic hyperplasia) 07/14/2013  . CAD (coronary artery disease)   . Carotid stenosis, bilateral    right 60-79% stenosed.  Left 40-59%  . Chronic kidney disease    chronic, stage II  . CRI (chronic renal insufficiency)   . Diverticulosis   . Dysphagia   . Esophageal stricture    Dr [name]  . Glaucoma   . Headache(784.0) 12/01/2013  . Hyperlipidemia   . Hypertension   . Insomnia 05/12/2015  . Low back pain syndrome 04/03/2014  . Medicare annual wellness visit, subsequent 02/04/2015  . Memory loss   . Postural lightheadedness   . PVD (peripheral vascular disease) ([place])   . Renal insufficiency   . Sensorineural hearing loss, bilateral   . Unspecified constipation 12/29/2012    Past Surgical History:  Procedure Laterality Date  . ABDOMINAL AORTIC ANEURYSM REPAIR  1996   Dr [name]  . CORONARY ARTERY BYPASS GRAFT    . EYE SURGERY     cataracts  b/l and laser  . HERNIA REPAIR  03/23/05   left inguinal Lichtenstein repair, with mesh.  [name]  . IR [facility] THORACIC WITH BONE BIOPSY  11/08/2017  . IR RADIOLOGIST EVAL & MGMT  10/22/2017  . RENAL ARTERY STENT  1996   Dr [name].  Bilateral stenting    Allergies: Patient has no known allergies.  Medications: Prior to Admission medications   Medication Sig Start Date End Date Taking? Authorizing Provider  amLODipine (NORVASC) 5 MG tablet TAKE 1 TABLET TWICE DAILY 02/19/18  Yes [name], MD  aspirin EC 81 MG EC tablet Take 81 mg by mouth at bedtime.  02/21/11  Yes [name], [name], DO  atorvastatin (LIPITOR) 40 MG tablet TAKE 1 TABLET EVERY DAY 03/05/18  Yes [name], MD  finasteride (PROSCAR) 5 MG tablet TAKE 1 TABLET EVERY DAY 02/19/18  Yes [name], MD  latanoprost (XALATAN) 0.005 % ophthalmic solution Place 1 drop into both eyes at bedtime. 08/13/17  Yes [provider]  losartan (COZAAR) 50 MG tablet Take 1 tablet (50 mg total) by mouth 2 (two) times daily. 08/10/17  Yes [name], MD  metoprolol succinate (TOPROL-XL) 50 MG 24 hr tablet Take 1 tablet (50 mg total) by mouth daily. Take with or immediately following a meal. 04/22/18  Yes [name], MD  pantoprazole (PROTONIX) 40 MG tablet Take 1 tablet (40 mg total) by mouth daily. 10/26/17  Yes [name], MD  timolol (TIMOPTIC) 0.5 % ophthalmic solution  Place 1 drop into both eyes daily.  02/25/15  Yes [provider]  ALPRAZolam Duanne Moron) 0.25 MG tablet Take one to two tablets by mouth prior to MRI.  You  must not drive, drink alcohol, or operate dangerous equipment after taking this medication. Patient not taking: Reported on 06/19/2018 05/01/18   [name], MD  methylPREDNISolone (MEDROL) 4 MG tablet 5 tab po qd X 1d then 4 tab po qd X 1d then 3 tab po qd X 1d then 2 tab po qd then 1 tab po qd Patient not taking: Reported on 06/19/2018 05/07/18   [name], MD     Family  History  Problem Relation Age of Onset  . Heart disease Father        CAD  . Coronary artery disease Father   . Stroke Father   . Hearing loss Father   . Hearing loss Paternal Grandfather   . Heart disease Son   . Heart disease Brother        MI  . Cancer Neg Hx   . Diabetes Neg Hx     Social History   Socioeconomic History  . Marital status: Married    Spouse name: Not on file  . Number of children: 3  . Years of education: Not on file  . Highest education level: Not on file  Occupational History  . Occupation: MEDIA ASSISTANT    Employer: [employer]  Social Needs  . Financial resource strain: Not on file  . Food insecurity:    Worry: Not on file    Inability: Not on file  . Transportation needs:    Medical: Not on file    Non-medical: Not on file  Tobacco Use  . Smoking status: Former Research scientist (life sciences)  . Smokeless tobacco: Never Used  . Tobacco comment: quit 1980  Substance and Sexual Activity  . Alcohol use: Yes    Alcohol/week: 1.0 standard drinks    Types: 1 Glasses of wine per week  . Drug use: No  . Sexual activity: Yes    Comment: lives with wife, retired, no dietary restriction  Lifestyle  . Physical activity:    Days per week: Not on file    Minutes per session: Not on file  . Stress: Not on file  Relationships  . Social connections:    Talks on phone: Not on file    Gets together: Not on file    Attends religious service: Not on file    Active member of club or organization: Not on file    Attends meetings of clubs or organizations: Not on file    Relationship status: Not on file  Other Topics Concern  . Not on file  Social History Narrative  . Not on file    Review of Systems: A 12 point ROS discussed and pertinent positives are indicated in the HPI above.  All other systems are negative.  Review of Systems  Constitutional: Positive for activity change. Negative for fatigue.  HENT: Positive for hearing loss.   Respiratory: Negative for  cough and shortness of breath.   Cardiovascular: Negative for chest pain.  Gastrointestinal: Negative for abdominal pain.  Musculoskeletal: Positive for back pain and gait problem.  Neurological: Positive for weakness.  Psychiatric/Behavioral: Negative for behavioral problems and confusion.    Vital Signs: BP (!) 189/69   Pulse (!) 58   Temp 97.8 F (36.6 C) (Oral)   Resp 16   Ht 5\' 9"  (1.753 m)  Wt 150 lb (68 kg)   SpO2 100%   BMI 22.15 kg/m   Physical Exam  Constitutional: He is oriented to person, place, and time.  Cardiovascular: Normal rate and regular rhythm.  Pulmonary/Chest: Effort normal and breath sounds normal.  Abdominal: Soft. Bowel sounds are normal.  Musculoskeletal: Normal range of motion.  Mid back pain  Neurological: He is alert and oriented to person, place, and time.  Skin: Skin is warm and dry.  Psychiatric: He has a normal mood and affect. His behavior is normal. Judgment and thought content normal.  Nursing note and vitals reviewed.   Imaging: No results found.  Labs:  CBC: Recent Labs    09/19/17 1041 11/08/17 0640 12/06/17 1510 12/11/17 1423  WBC 12.7* 11.9* 15.6* 14.8*  HGB 13.5 13.5 13.9 13.0  HCT 41.2 40.5 42.5 39.6  PLT 282.0 285 358.0 320.0    COAGS: Recent Labs    11/08/17 0640  INR 1.08    BMP: Recent Labs    09/11/17 1412 11/08/17 0640 12/06/17 1510  NA 136 136 137  K 4.1 3.8 4.9  CL 102 104 102  CO2 26 24 23   GLUCOSE 107* 101* 84  BUN 23 21* 27*  CALCIUM 9.7 8.9 9.4  CREATININE 1.36 1.31* 1.33  GFRNONAA  --  47*  --   GFRAA  --  55*  --     LIVER FUNCTION TESTS: Recent Labs    09/11/17 1412 12/06/17 1510  BILITOT 0.6 0.4  AST 16 17  ALT 12 15  ALKPHOS 59 65  PROT 7.4 7.8  ALBUMIN 3.9 4.2    TUMOR MARKERS: No results for input(s): AFPTM, CEA, CA199, CHROMGRNA in the last 8760 hours.  Assessment and Plan:  Worsening back pain for few months Pain meds without relief Hx T6 KP 11/2017-- pain  relief was significant Now scheduled for T5 KP/VP Risks and benefits of T5 Kyphoplasty/Vertebroplasty were discussed with the patient including, but not limited to education regarding the natural healing process of compression fractures without intervention, bleeding, infection, cement migration which may cause spinal cord damage, paralysis, pulmonary embolism or even death.  This interventional procedure involves the use of X-rays and because of the nature of the planned procedure, it is possible that we will have prolonged use of X-ray fluoroscopy.  Potential radiation risks to you include (but are not limited to) the following: - A slightly elevated risk for cancer  several years later in life. This risk is typically less than 0.5% percent. This risk is low in comparison to the normal incidence of human cancer, which is 33% for women and 50% for men according to the [place]. - Radiation induced injury can include skin redness, resembling a rash, tissue breakdown / ulcers and hair loss (which can be temporary or permanent).   The likelihood of either of these occurring depends on the difficulty of the procedure and whether you are sensitive to radiation due to previous procedures, disease, or genetic conditions.   IF your procedure requires a prolonged use of radiation, you will be notified and given written instructions for further action.  It is your responsibility to monitor the irradiated area for the 2 weeks following the procedure and to notify your physician if you are concerned that you have suffered a radiation induced injury.    All of the patient's questions were answered, patient is agreeable to proceed.  Consent signed and in chart.  Thank you for this interesting consult.  I  greatly enjoyed meeting JASIAH ELSEN and look forward to participating in their care.  A copy of this report was sent to the requesting provider on this date.  Electronically  Signed: Lavonia Drafts, PA-C 06/25/2018, 7:31 AM   I spent a total of    25 Minutes in face to face in clinical consultation, greater than 50% of which was counseling/coordinating care for T5 KP/VP

## 2018-06-25 NOTE — Procedures (Signed)
S/P T 5 VP 

## 2018-06-26 ENCOUNTER — Encounter (HOSPITAL_COMMUNITY): Payer: Self-pay | Admitting: Interventional Radiology

## 2018-07-05 DIAGNOSIS — H26492 Other secondary cataract, left eye: Secondary | ICD-10-CM | POA: Diagnosis not present

## 2018-07-05 DIAGNOSIS — H401132 Primary open-angle glaucoma, bilateral, moderate stage: Secondary | ICD-10-CM | POA: Diagnosis not present

## 2018-07-14 ENCOUNTER — Other Ambulatory Visit: Payer: Self-pay | Admitting: Family Medicine

## 2018-07-15 ENCOUNTER — Telehealth: Payer: Self-pay | Admitting: Family Medicine

## 2018-07-15 NOTE — Telephone Encounter (Signed)
Copied from [place] [id number]. Topic: Quick Communication - See Telephone Encounter >> Jul 15, 2018  2:12 PM Mylinda Latina, NT wrote: CRM for notification. See Telephone encounter for: 07/15/18. Patient wife called and states that the patient needs a 10 day supply of his losartan (COZAAR) 50 MG tablet sent to the pharamcy. It will take a while before [employer] will send it out.  [place], [place] [phone] (Phone) [phone] (Fax)

## 2018-07-16 MED ORDER — LOSARTAN POTASSIUM 50 MG PO TABS: 50.0000 mg | ORAL_TABLET | Freq: Two times a day (BID) | ORAL | 0 refills | Status: DC

## 2018-07-23 ENCOUNTER — Ambulatory Visit (INDEPENDENT_AMBULATORY_CARE_PROVIDER_SITE_OTHER): Payer: Medicare HMO | Admitting: Family Medicine

## 2018-07-23 ENCOUNTER — Other Ambulatory Visit: Payer: Self-pay | Admitting: Family Medicine

## 2018-07-23 VITALS — BP 140/82 | HR 66 | Temp 98.3°F | Resp 18 | Wt 143.0 lb

## 2018-07-23 DIAGNOSIS — R739 Hyperglycemia, unspecified: Secondary | ICD-10-CM

## 2018-07-23 DIAGNOSIS — I10 Essential (primary) hypertension: Secondary | ICD-10-CM

## 2018-07-23 DIAGNOSIS — M8008XG Age-related osteoporosis with current pathological fracture, vertebra(e), subsequent encounter for fracture with delayed healing: Secondary | ICD-10-CM

## 2018-07-23 DIAGNOSIS — M8008XA Age-related osteoporosis with current pathological fracture, vertebra(e), initial encounter for fracture: Secondary | ICD-10-CM | POA: Insufficient documentation

## 2018-07-23 DIAGNOSIS — M549 Dorsalgia, unspecified: Secondary | ICD-10-CM

## 2018-07-23 DIAGNOSIS — Z23 Encounter for immunization: Secondary | ICD-10-CM | POA: Diagnosis not present

## 2018-07-23 MED ORDER — FINASTERIDE 5 MG PO TABS: 5.0000 mg | ORAL_TABLET | Freq: Every day | ORAL | 1 refills | Status: DC

## 2018-07-23 MED ORDER — PREDNISONE 10 MG PO TABS: ORAL_TABLET | ORAL | 1 refills | Status: DC

## 2018-07-23 MED ORDER — TIZANIDINE HCL 2 MG PO TABS: 2.0000 mg | ORAL_TABLET | Freq: Three times a day (TID) | ORAL | 0 refills | Status: DC | PRN

## 2018-07-23 MED ORDER — AMLODIPINE BESYLATE 5 MG PO TABS: 5.0000 mg | ORAL_TABLET | Freq: Two times a day (BID) | ORAL | 1 refills | Status: DC

## 2018-07-23 MED ORDER — ATORVASTATIN CALCIUM 40 MG PO TABS: 40.0000 mg | ORAL_TABLET | Freq: Every day | ORAL | 1 refills | Status: DC

## 2018-07-23 MED ORDER — METOPROLOL SUCCINATE ER 50 MG PO TB24: 50.0000 mg | ORAL_TABLET | Freq: Every morning | ORAL | 1 refills | Status: DC

## 2018-07-23 MED ORDER — PANTOPRAZOLE SODIUM 40 MG PO TBEC: 40.0000 mg | DELAYED_RELEASE_TABLET | ORAL | 2 refills | Status: DC

## 2018-07-23 NOTE — Patient Instructions (Addendum)
Try mixing Miralax and Benefiber together once to twice a day to move the bowels Back Pain, Adult Many adults have back pain from time to time. Common causes of back pain include:  A strained muscle or ligament.  Wear and tear (degeneration) of the spinal disks.  Arthritis.  A hit to the back.  Back pain can be short-lived (acute) or last a long time (chronic). A physical exam, lab tests, and imaging studies may be done to find the cause of your pain. Follow these instructions at home: Managing pain and stiffness  Take over-the-counter and prescription medicines only as told by your health care provider.  If directed, apply heat to the affected area as often as told by your health care provider. Use the heat source that your health care provider recommends, such as a moist heat pack or a heating pad. ? Place a towel between your skin and the heat source. ? Leave the heat on for 20-30 minutes. ? Remove the heat if your skin turns bright red. This is especially important if you are unable to feel pain, heat, or cold. You have a greater risk of getting burned.  If directed, apply ice to the injured area: ? Put ice in a plastic bag. ? Place a towel between your skin and the bag. ? Leave the ice on for 20 minutes, 2-3 times a day for the first 2-3 days. Activity  Do not stay in bed. Resting more than 1-2 days can delay your recovery.  Take short walks on even surfaces as soon as you are able. Try to increase the length of time you walk each day.  Do not sit, drive, or stand in one place for more than 30 minutes at a time. Sitting or standing for long periods of time can put stress on your back.  Use proper lifting techniques. When you bend and lift, use positions that put less stress on your back: ? [place] your knees. ? Keep the load close to your body. ? Avoid twisting.  Exercise regularly as told by your health care provider. Exercising will help your back heal faster. This also  helps prevent back injuries by keeping muscles strong and flexible.  Your health care provider may recommend that you see a physical therapist. This person can help you come up with a safe exercise program. Do any exercises as told by your physical therapist. Lifestyle  Maintain a healthy weight. Extra weight puts stress on your back and makes it difficult to have good posture.  Avoid activities or situations that make you feel anxious or stressed. Learn ways to manage anxiety and stress. One way to manage stress is through exercise. Stress and anxiety increase muscle tension and can make back pain worse. General instructions  Sleep on a firm mattress in a comfortable position. Try lying on your side with your knees slightly bent. If you lie on your back, put a pillow under your knees.  Follow your treatment plan as told by your health care provider. This may include: ? Cognitive or behavioral therapy. ? Acupuncture or massage therapy. ? Meditation or yoga. Contact a health care provider if:  You have pain that is not relieved with rest or medicine.  You have increasing pain going down into your legs or buttocks.  Your pain does not improve in 2 weeks.  You have pain at night.  You lose weight.  You have a fever or chills. Get help right away if:  You develop new  bowel or bladder control problems.  You have unusual weakness or numbness in your arms or legs.  You develop nausea or vomiting.  You develop abdominal pain.  You feel faint. Summary  Many adults have back pain from time to time. A physical exam, lab tests, and imaging studies may be done to find the cause of your pain.  Use proper lifting techniques. When you bend and lift, use positions that put less stress on your back.  Take over-the-counter and prescription medicines and apply heat or ice as directed by your health care provider. This information is not intended to replace advice given to you by your  health care provider. Make sure you discuss any questions you have with your health care provider. Document Released: 10/23/2005 Document Revised: 11/27/2016 Document Reviewed: 11/27/2016 Elsevier Interactive Patient Education  [employer].

## 2018-07-23 NOTE — Assessment & Plan Note (Signed)
hgba1c acceptable, minimize simple carbs. Increase exercise as tolerated.  

## 2018-07-23 NOTE — Assessment & Plan Note (Signed)
New fracture at endplate with 61% collapse and pain to right flank around to front daily. Some positions are still better than other but the pain never resolves. He is able to sleep but his pain is severe enough during the day that he is not eating and it limits his mobility greatly. Have consulted interventional radiology but fracture is not amenable to kyphoplasty. Will refer to Dr Nelva Bush at [facility] to see if he can help with his nerve pain, will also start Prednisone taper, Tizanidine prn

## 2018-07-23 NOTE — Assessment & Plan Note (Signed)
Well controlled, no changes to meds. Encouraged heart healthy diet such as the DASH diet and exercise as tolerated.  °

## 2018-07-23 NOTE — Progress Notes (Addendum)
Subjective:  I acted as a Education administrator for Dr. Charlett Blake. [place], [place]  Patient ID: Troy Ray, male    DOB: 02-09-1928, 84 y.o.   MRN: [id number]  No chief complaint on file.   HPI  Patient is in today for a 3 month follow up. Accompanied by his wife he is following up on his HTN, and other medical concerns. His greatest concern is his ongoing right sided back pain that has been present since his recent vertebral collapse at T5. He has been evaluated interventional radiology but he is not a candidate. No incontinence or recent falls. Denies CP/palp/SOB/HA/congestion/fevers/GI or GU c/o. Taking meds as prescribed. He reports he feels well except for his pain. They noe his appetite is suppressed due to his pain.   Patient Care Team: [name], MD as PCP - General (Family Medicine) Calvert Cantor, MD as Consulting Physician (Ophthalmology)   Past Medical History:  Diagnosis Date  . Adenomatous colon polyp   . BPH (benign prostatic hyperplasia) 07/14/2013  . CAD (coronary artery disease)   . Carotid stenosis, bilateral    right 60-79% stenosed.  Left 40-59%  . Chronic kidney disease    chronic, stage II  . CRI (chronic renal insufficiency)   . Diverticulosis   . Dysphagia   . Esophageal stricture    Dr [name]  . Glaucoma   . Headache(784.0) 12/01/2013  . Hyperlipidemia   . Hypertension   . Insomnia 05/12/2015  . Low back pain syndrome 04/03/2014  . Medicare annual wellness visit, subsequent 02/04/2015  . Memory loss   . Postural lightheadedness   . PVD (peripheral vascular disease) ([place])   . Renal insufficiency   . Sensorineural hearing loss, bilateral   . Unspecified constipation 12/29/2012    Past Surgical History:  Procedure Laterality Date  . ABDOMINAL AORTIC ANEURYSM REPAIR  1996   Dr [name]  . CORONARY ARTERY BYPASS GRAFT    . EYE SURGERY     cataracts b/l and laser  . HERNIA REPAIR  03/23/05   left inguinal Lichtenstein repair, with mesh.  [name]  . IR  [facility] THORACIC WITH BONE BIOPSY  11/08/2017  . IR RADIOLOGIST EVAL & MGMT  10/22/2017  . IR VERTEBROPLASTY CERV/THOR BX INC UNI/BIL INC/INJECT/IMAGING  06/25/2018  . RENAL ARTERY STENT  1996   Dr [name].  Bilateral stenting    Family History  Problem Relation Age of Onset  . Heart disease Father        CAD  . Coronary artery disease Father   . Stroke Father   . Hearing loss Father   . Hearing loss Paternal Grandfather   . Heart disease Son   . Heart disease Brother        MI  . Cancer Neg Hx   . Diabetes Neg Hx     Social History   Socioeconomic History  . Marital status: Married    Spouse name: Not on file  . Number of children: 3  . Years of education: Not on file  . Highest education level: Not on file  Occupational History  . Occupation: MEDIA ASSISTANT    Employer: [employer]  Social Needs  . Financial resource strain: Not on file  . Food insecurity:    Worry: Not on file    Inability: Not on file  . Transportation needs:    Medical: Not on file    Non-medical: Not on file  Tobacco Use  . Smoking status: Former Research scientist (life sciences)  .  Smokeless tobacco: Never Used  . Tobacco comment: quit 1980  Substance and Sexual Activity  . Alcohol use: Yes    Alcohol/week: 1.0 standard drinks    Types: 1 Glasses of wine per week  . Drug use: No  . Sexual activity: Yes    Comment: lives with wife, retired, no dietary restriction  Lifestyle  . Physical activity:    Days per week: Not on file    Minutes per session: Not on file  . Stress: Not on file  Relationships  . Social connections:    Talks on phone: Not on file    Gets together: Not on file    Attends religious service: Not on file    Active member of club or organization: Not on file    Attends meetings of clubs or organizations: Not on file    Relationship status: Not on file  . Intimate partner violence:    Fear of current or ex partner: Not on file    Emotionally abused: Not on file    Physically abused:  Not on file    Forced sexual activity: Not on file  Other Topics Concern  . Not on file  Social History Narrative  . Not on file    Outpatient Medications Prior to Visit  Medication Sig Dispense Refill  . aspirin EC 81 MG EC tablet Take 81 mg by mouth at bedtime.     [name] Kitchen latanoprost (XALATAN) 0.005 % ophthalmic solution Place 1 drop into both eyes at bedtime.    [name] Kitchen losartan (COZAAR) 50 MG tablet TAKE 1 TABLET TWICE DAILY 180 tablet 2  . timolol (TIMOPTIC) 0.5 % ophthalmic solution Place 1 drop into both eyes daily.     [name] Kitchen amLODipine (NORVASC) 5 MG tablet TAKE 1 TABLET TWICE DAILY 180 tablet 1  . atorvastatin (LIPITOR) 40 MG tablet TAKE 1 TABLET EVERY DAY 90 tablet 1  . finasteride (PROSCAR) 5 MG tablet TAKE 1 TABLET EVERY DAY 90 tablet 1  . metoprolol succinate (TOPROL-XL) 50 MG 24 hr tablet Take 1 tablet (50 mg total) by mouth daily. Take with or immediately following a meal. 90 tablet 1  . pantoprazole (PROTONIX) 40 MG tablet TAKE 1 TABLET EVERY DAY 90 tablet 2  . ALPRAZolam (XANAX) 0.25 MG tablet Take one to two tablets by mouth prior to MRI.  You  must not drive, drink alcohol, or operate dangerous equipment after taking this medication. (Patient not taking: Reported on 06/19/2018) 2 tablet 0   No facility-administered medications prior to visit.     No Known Allergies  ROS     Objective:    Physical Exam  Constitutional: He is oriented to person, place, and time. He appears well-developed and well-nourished. No distress.  HENT:  Head: Normocephalic and atraumatic.  Nose: Nose normal.  Eyes: Right eye exhibits no discharge. Left eye exhibits no discharge.  Neck: Normal range of motion. Neck supple.  Cardiovascular: Normal rate and regular rhythm.  Pulmonary/Chest: Effort normal and breath sounds normal.  Abdominal: Soft. Bowel sounds are normal. There is no tenderness.  Musculoskeletal: He exhibits no edema.  Neurological: He is alert and oriented to person, place, and time.    Skin: Skin is warm and dry.  Psychiatric: He has a normal mood and affect.  Nursing note and vitals reviewed.   BP 140/82   Pulse 66   Temp 98.3 F (36.8 C) (Oral)   Resp 18   Wt 143 lb (64.9 kg)   SpO2 97%  BMI 21.12 kg/m  Wt Readings from Last 3 Encounters:  07/23/18 143 lb (64.9 kg)  06/25/18 150 lb (68 kg)  04/22/18 151 lb (68.5 kg)   BP Readings from Last 3 Encounters:  07/23/18 140/82  06/25/18 (!) 166/65  04/22/18 (!) 164/62     Immunization History  Administered Date(s) Administered  . Influenza Whole 08/17/2008, 09/20/2009, 08/23/2010  . Influenza, High Dose Seasonal PF 07/29/2015, 06/29/2016, 08/16/2017, 07/23/2018  . Influenza,inj,Quad PF,6+ Mos 07/11/2013, 08/13/2014  . Pneumococcal Conjugate-13 08/13/2014  . Pneumococcal Polysaccharide-23 11/20/2006, 09/30/2012  . Zoster 09/22/2010    Health Maintenance  Topic Date Due  . TETANUS/TDAP  03/21/1949  . INFLUENZA VACCINE  Completed  . PNA vac Low Risk Adult  Completed    Lab Results  Component Value Date   WBC 13.7 (H) 06/25/2018   HGB 13.2 06/25/2018   HCT 42.3 06/25/2018   PLT 264 06/25/2018   GLUCOSE 100 (H) 06/25/2018   CHOL 126 12/06/2017   TRIG 192.0 (H) 12/06/2017   HDL 40.80 12/06/2017   LDLCALC 47 12/06/2017   ALT 15 12/06/2017   AST 17 12/06/2017   NA 137 06/25/2018   K 4.1 06/25/2018   CL 104 06/25/2018   CREATININE 1.38 (H) 06/25/2018   BUN 19 06/25/2018   CO2 24 06/25/2018   TSH 3.64 12/06/2017   PSA 0.75 10/06/2013   INR 1.06 06/25/2018   HGBA1C 6.2 12/06/2017   MICROALBUR 0.87 10/06/2013    Lab Results  Component Value Date   TSH 3.64 12/06/2017   Lab Results  Component Value Date   WBC 13.7 (H) 06/25/2018   HGB 13.2 06/25/2018   HCT 42.3 06/25/2018   MCV 89.4 06/25/2018   PLT 264 06/25/2018   Lab Results  Component Value Date   NA 137 06/25/2018   K 4.1 06/25/2018   CO2 24 06/25/2018   GLUCOSE 100 (H) 06/25/2018   BUN 19 06/25/2018   CREATININE 1.38 (H)  06/25/2018   BILITOT 0.4 12/06/2017   ALKPHOS 65 12/06/2017   AST 17 12/06/2017   ALT 15 12/06/2017   PROT 7.8 12/06/2017   ALBUMIN 4.2 12/06/2017   CALCIUM 9.0 06/25/2018   ANIONGAP 9 06/25/2018   GFR 53.97 (L) 12/06/2017   Lab Results  Component Value Date   CHOL 126 12/06/2017   Lab Results  Component Value Date   HDL 40.80 12/06/2017   Lab Results  Component Value Date   LDLCALC 47 12/06/2017   Lab Results  Component Value Date   TRIG 192.0 (H) 12/06/2017   Lab Results  Component Value Date   CHOLHDL 3 12/06/2017   Lab Results  Component Value Date   HGBA1C 6.2 12/06/2017         Assessment & Plan:   Problem List Items Addressed This Visit    Essential hypertension    Well controlled, no changes to meds. Encouraged heart healthy diet such as the DASH diet and exercise as tolerated.       Relevant Medications   amLODipine (NORVASC) 5 MG tablet   metoprolol succinate (TOPROL-XL) 50 MG 24 hr tablet   Hyperglycemia    hgba1c acceptable, minimize simple carbs. Increase exercise as tolerated.      Vertebral fracture, osteoporotic (HCC)    New fracture at endplate with 32% collapse and pain to right flank around to front daily. Some positions are still better than other but the pain never resolves. He is able to sleep but his pain is severe enough  during the day that he is not eating and it limits his mobility greatly. Have consulted interventional radiology but fracture is not amenable to kyphoplasty. Will refer to Dr Nelva Bush at [facility] to see if he can help with his nerve pain, will also start Prednisone taper, Tizanidine prn       Other Visit Diagnoses    Needs flu shot    -  Primary   Relevant Orders   Flu vaccine HIGH DOSE PF (Fluzone High dose) (Completed)   Back pain, unspecified back location, unspecified back pain laterality, unspecified chronicity       Relevant Medications   predniSONE (DELTASONE) 10 MG tablet   tiZANidine (ZANAFLEX) 2 MG tablet     Other Relevant Orders   Ambulatory referral to Orthopedic Surgery      I have discontinued Tyrick W. Schalk's atorvastatin and ALPRAZolam. I have also changed his amLODipine, pantoprazole, metoprolol succinate, and finasteride. Additionally, I am having him start on predniSONE and tiZANidine. Lastly, I am having him maintain his aspirin EC, timolol, latanoprost, and losartan.  Meds ordered this encounter  Medications  . predniSONE (DELTASONE) 10 MG tablet    Sig: Take 4 tabs ;po qd x 3 days then TAKE 3 TABLETS PO QD FOR 3 DAYS THEN TAKE 2 TABLETS PO QD FOR 3 DAYS THEN TAKE 1 TABLET PO QD    Dispense:  60 tablet    Refill:  1  . amLODipine (NORVASC) 5 MG tablet    Sig: Take 1 tablet (5 mg total) by mouth 2 (two) times daily.    Dispense:  180 tablet    Refill:  1  . DISCONTD: atorvastatin (LIPITOR) 40 MG tablet    Sig: Take 1 tablet (40 mg total) by mouth at bedtime.    Dispense:  90 tablet    Refill:  1  . pantoprazole (PROTONIX) 40 MG tablet    Sig: Take 1 tablet (40 mg total) by mouth every morning.    Dispense:  90 tablet    Refill:  2  . metoprolol succinate (TOPROL-XL) 50 MG 24 hr tablet    Sig: Take 1 tablet (50 mg total) by mouth every morning. Take with or immediately following a meal.    Dispense:  90 tablet    Refill:  1  . finasteride (PROSCAR) 5 MG tablet    Sig: Take 1 tablet (5 mg total) by mouth at bedtime.    Dispense:  90 tablet    Refill:  1  . tiZANidine (ZANAFLEX) 2 MG tablet    Sig: Take 1 tablet (2 mg total) by mouth every 8 (eight) hours as needed for muscle spasms.    Dispense:  30 tablet    Refill:  0    CMA served as scribe during this visit. History, Physical and Plan performed by medical provider. Documentation and orders reviewed and attested to.  Penni Homans, MD

## 2018-07-24 ENCOUNTER — Other Ambulatory Visit: Payer: Self-pay | Admitting: Family Medicine

## 2018-07-24 DIAGNOSIS — I10 Essential (primary) hypertension: Secondary | ICD-10-CM

## 2018-07-24 NOTE — Assessment & Plan Note (Signed)
Encouraged increased hydration, 64 ounces of clear fluids daily. Minimize alcohol and caffeine. Eat small frequent meals with lean proteins and complex carbs. Avoid high and low blood sugars. Get adequate sleep, 7-8 hours a night. Needs exercise daily preferably in the morning.  

## 2018-08-08 ENCOUNTER — Emergency Department (HOSPITAL_BASED_OUTPATIENT_CLINIC_OR_DEPARTMENT_OTHER): Payer: Medicare HMO

## 2018-08-08 ENCOUNTER — Encounter (HOSPITAL_BASED_OUTPATIENT_CLINIC_OR_DEPARTMENT_OTHER): Payer: Self-pay | Admitting: Emergency Medicine

## 2018-08-08 ENCOUNTER — Emergency Department (HOSPITAL_BASED_OUTPATIENT_CLINIC_OR_DEPARTMENT_OTHER)
Admission: EM | Admit: 2018-08-08 | Discharge: 2018-08-08 | Disposition: A | Discharge status: Home / Self Care | Payer: Medicare HMO | Attending: Emergency Medicine | Admitting: Emergency Medicine

## 2018-08-08 ENCOUNTER — Other Ambulatory Visit: Payer: Self-pay

## 2018-08-08 ENCOUNTER — Inpatient Hospital Stay (HOSPITAL_COMMUNITY)
Admission: EM | Admit: 2018-08-08 | Discharge: 2018-08-23 | DRG: 542 | Disposition: A | Discharge status: Other Acute Inpatient Hospital | Payer: Medicare HMO | Attending: Internal Medicine | Admitting: Internal Medicine

## 2018-08-08 ENCOUNTER — Telehealth: Payer: Self-pay | Admitting: Family Medicine

## 2018-08-08 DIAGNOSIS — M8008XA Age-related osteoporosis with current pathological fracture, vertebra(e), initial encounter for fracture: Secondary | ICD-10-CM | POA: Diagnosis not present

## 2018-08-08 DIAGNOSIS — R339 Retention of urine, unspecified: Secondary | ICD-10-CM | POA: Diagnosis not present

## 2018-08-08 DIAGNOSIS — Z743 Need for continuous supervision: Secondary | ICD-10-CM | POA: Diagnosis not present

## 2018-08-08 DIAGNOSIS — R262 Difficulty in walking, not elsewhere classified: Secondary | ICD-10-CM

## 2018-08-08 DIAGNOSIS — E861 Hypovolemia: Secondary | ICD-10-CM | POA: Diagnosis not present

## 2018-08-08 DIAGNOSIS — E782 Mixed hyperlipidemia: Secondary | ICD-10-CM | POA: Diagnosis not present

## 2018-08-08 DIAGNOSIS — E875 Hyperkalemia: Secondary | ICD-10-CM | POA: Diagnosis present

## 2018-08-08 DIAGNOSIS — F05 Delirium due to known physiological condition: Secondary | ICD-10-CM | POA: Diagnosis present

## 2018-08-08 DIAGNOSIS — R2681 Unsteadiness on feet: Secondary | ICD-10-CM | POA: Diagnosis not present

## 2018-08-08 DIAGNOSIS — Z7982 Long term (current) use of aspirin: Secondary | ICD-10-CM | POA: Diagnosis not present

## 2018-08-08 DIAGNOSIS — E440 Moderate protein-calorie malnutrition: Secondary | ICD-10-CM | POA: Diagnosis not present

## 2018-08-08 DIAGNOSIS — I350 Nonrheumatic aortic (valve) stenosis: Secondary | ICD-10-CM | POA: Insufficient documentation

## 2018-08-08 DIAGNOSIS — J181 Lobar pneumonia, unspecified organism: Secondary | ICD-10-CM | POA: Diagnosis not present

## 2018-08-08 DIAGNOSIS — S22050D Wedge compression fracture of T5-T6 vertebra, subsequent encounter for fracture with routine healing: Secondary | ICD-10-CM | POA: Diagnosis not present

## 2018-08-08 DIAGNOSIS — W19XXXD Unspecified fall, subsequent encounter: Secondary | ICD-10-CM | POA: Insufficient documentation

## 2018-08-08 DIAGNOSIS — Z87891 Personal history of nicotine dependence: Secondary | ICD-10-CM | POA: Insufficient documentation

## 2018-08-08 DIAGNOSIS — Z8679 Personal history of other diseases of the circulatory system: Secondary | ICD-10-CM

## 2018-08-08 DIAGNOSIS — K5730 Diverticulosis of large intestine without perforation or abscess without bleeding: Secondary | ICD-10-CM | POA: Diagnosis not present

## 2018-08-08 DIAGNOSIS — R296 Repeated falls: Secondary | ICD-10-CM

## 2018-08-08 DIAGNOSIS — I6523 Occlusion and stenosis of bilateral carotid arteries: Secondary | ICD-10-CM | POA: Diagnosis present

## 2018-08-08 DIAGNOSIS — R269 Unspecified abnormalities of gait and mobility: Secondary | ICD-10-CM

## 2018-08-08 DIAGNOSIS — M546 Pain in thoracic spine: Secondary | ICD-10-CM | POA: Diagnosis not present

## 2018-08-08 DIAGNOSIS — R05 Cough: Secondary | ICD-10-CM

## 2018-08-08 DIAGNOSIS — I129 Hypertensive chronic kidney disease with stage 1 through stage 4 chronic kidney disease, or unspecified chronic kidney disease: Secondary | ICD-10-CM | POA: Diagnosis present

## 2018-08-08 DIAGNOSIS — S22049D Unspecified fracture of fourth thoracic vertebra, subsequent encounter for fracture with routine healing: Secondary | ICD-10-CM | POA: Diagnosis not present

## 2018-08-08 DIAGNOSIS — E871 Hypo-osmolality and hyponatremia: Secondary | ICD-10-CM | POA: Diagnosis not present

## 2018-08-08 DIAGNOSIS — Z951 Presence of aortocoronary bypass graft: Secondary | ICD-10-CM

## 2018-08-08 DIAGNOSIS — L8990 Pressure ulcer of unspecified site, unspecified stage: Secondary | ICD-10-CM | POA: Diagnosis present

## 2018-08-08 DIAGNOSIS — Z7902 Long term (current) use of antithrombotics/antiplatelets: Secondary | ICD-10-CM

## 2018-08-08 DIAGNOSIS — M8008XG Age-related osteoporosis with current pathological fracture, vertebra(e), subsequent encounter for fracture with delayed healing: Secondary | ICD-10-CM | POA: Diagnosis not present

## 2018-08-08 DIAGNOSIS — I2510 Atherosclerotic heart disease of native coronary artery without angina pectoris: Secondary | ICD-10-CM | POA: Diagnosis not present

## 2018-08-08 DIAGNOSIS — M549 Dorsalgia, unspecified: Secondary | ICD-10-CM | POA: Diagnosis not present

## 2018-08-08 DIAGNOSIS — Z8249 Family history of ischemic heart disease and other diseases of the circulatory system: Secondary | ICD-10-CM

## 2018-08-08 DIAGNOSIS — G8929 Other chronic pain: Secondary | ICD-10-CM | POA: Diagnosis present

## 2018-08-08 DIAGNOSIS — Z7901 Long term (current) use of anticoagulants: Secondary | ICD-10-CM

## 2018-08-08 DIAGNOSIS — J90 Pleural effusion, not elsewhere classified: Secondary | ICD-10-CM | POA: Diagnosis not present

## 2018-08-08 DIAGNOSIS — S3992XA Unspecified injury of lower back, initial encounter: Secondary | ICD-10-CM | POA: Diagnosis not present

## 2018-08-08 DIAGNOSIS — I69828 Other speech and language deficits following other cerebrovascular disease: Secondary | ICD-10-CM | POA: Diagnosis not present

## 2018-08-08 DIAGNOSIS — R338 Other retention of urine: Secondary | ICD-10-CM | POA: Diagnosis present

## 2018-08-08 DIAGNOSIS — H9190 Unspecified hearing loss, unspecified ear: Secondary | ICD-10-CM | POA: Diagnosis present

## 2018-08-08 DIAGNOSIS — Z66 Do not resuscitate: Secondary | ICD-10-CM | POA: Diagnosis present

## 2018-08-08 DIAGNOSIS — Q253 Supravalvular aortic stenosis: Secondary | ICD-10-CM

## 2018-08-08 DIAGNOSIS — Z823 Family history of stroke: Secondary | ICD-10-CM

## 2018-08-08 DIAGNOSIS — R2689 Other abnormalities of gait and mobility: Secondary | ICD-10-CM | POA: Diagnosis not present

## 2018-08-08 DIAGNOSIS — D72829 Elevated white blood cell count, unspecified: Secondary | ICD-10-CM | POA: Diagnosis present

## 2018-08-08 DIAGNOSIS — M5489 Other dorsalgia: Secondary | ICD-10-CM | POA: Diagnosis not present

## 2018-08-08 DIAGNOSIS — H409 Unspecified glaucoma: Secondary | ICD-10-CM | POA: Diagnosis present

## 2018-08-08 DIAGNOSIS — R410 Disorientation, unspecified: Secondary | ICD-10-CM | POA: Diagnosis not present

## 2018-08-08 DIAGNOSIS — I513 Intracardiac thrombosis, not elsewhere classified: Secondary | ICD-10-CM | POA: Diagnosis present

## 2018-08-08 DIAGNOSIS — R1312 Dysphagia, oropharyngeal phase: Secondary | ICD-10-CM | POA: Diagnosis not present

## 2018-08-08 DIAGNOSIS — R32 Unspecified urinary incontinence: Secondary | ICD-10-CM | POA: Diagnosis present

## 2018-08-08 DIAGNOSIS — G9341 Metabolic encephalopathy: Secondary | ICD-10-CM | POA: Diagnosis not present

## 2018-08-08 DIAGNOSIS — R279 Unspecified lack of coordination: Secondary | ICD-10-CM | POA: Diagnosis not present

## 2018-08-08 DIAGNOSIS — N183 Chronic kidney disease, stage 3 (moderate): Secondary | ICD-10-CM | POA: Diagnosis present

## 2018-08-08 DIAGNOSIS — G8191 Hemiplegia, unspecified affecting right dominant side: Secondary | ICD-10-CM | POA: Diagnosis not present

## 2018-08-08 DIAGNOSIS — I712 Thoracic aortic aneurysm, without rupture: Secondary | ICD-10-CM | POA: Diagnosis not present

## 2018-08-08 DIAGNOSIS — N401 Enlarged prostate with lower urinary tract symptoms: Secondary | ICD-10-CM | POA: Diagnosis not present

## 2018-08-08 DIAGNOSIS — I7389 Other specified peripheral vascular diseases: Secondary | ICD-10-CM | POA: Diagnosis present

## 2018-08-08 DIAGNOSIS — M6281 Muscle weakness (generalized): Secondary | ICD-10-CM | POA: Diagnosis not present

## 2018-08-08 DIAGNOSIS — Z79899 Other long term (current) drug therapy: Secondary | ICD-10-CM | POA: Insufficient documentation

## 2018-08-08 DIAGNOSIS — I10 Essential (primary) hypertension: Secondary | ICD-10-CM | POA: Diagnosis present

## 2018-08-08 DIAGNOSIS — I63233 Cerebral infarction due to unspecified occlusion or stenosis of bilateral carotid arteries: Secondary | ICD-10-CM | POA: Diagnosis not present

## 2018-08-08 DIAGNOSIS — R059 Cough, unspecified: Secondary | ICD-10-CM

## 2018-08-08 DIAGNOSIS — I351 Nonrheumatic aortic (valve) insufficiency: Secondary | ICD-10-CM | POA: Diagnosis not present

## 2018-08-08 DIAGNOSIS — I700 Atherosclerosis of aorta: Secondary | ICD-10-CM | POA: Diagnosis present

## 2018-08-08 DIAGNOSIS — K449 Diaphragmatic hernia without obstruction or gangrene: Secondary | ICD-10-CM | POA: Diagnosis not present

## 2018-08-08 DIAGNOSIS — N182 Chronic kidney disease, stage 2 (mild): Secondary | ICD-10-CM | POA: Insufficient documentation

## 2018-08-08 DIAGNOSIS — E860 Dehydration: Secondary | ICD-10-CM | POA: Diagnosis not present

## 2018-08-08 DIAGNOSIS — R531 Weakness: Secondary | ICD-10-CM | POA: Diagnosis not present

## 2018-08-08 DIAGNOSIS — R29818 Other symptoms and signs involving the nervous system: Secondary | ICD-10-CM | POA: Diagnosis not present

## 2018-08-08 DIAGNOSIS — S299XXA Unspecified injury of thorax, initial encounter: Secondary | ICD-10-CM | POA: Diagnosis not present

## 2018-08-08 DIAGNOSIS — R41841 Cognitive communication deficit: Secondary | ICD-10-CM | POA: Diagnosis not present

## 2018-08-08 DIAGNOSIS — J189 Pneumonia, unspecified organism: Secondary | ICD-10-CM | POA: Diagnosis not present

## 2018-08-08 DIAGNOSIS — Z86010 Personal history of colonic polyps: Secondary | ICD-10-CM

## 2018-08-08 DIAGNOSIS — R4182 Altered mental status, unspecified: Secondary | ICD-10-CM | POA: Diagnosis not present

## 2018-08-08 DIAGNOSIS — W19XXXA Unspecified fall, initial encounter: Secondary | ICD-10-CM

## 2018-08-08 DIAGNOSIS — I959 Hypotension, unspecified: Secondary | ICD-10-CM | POA: Diagnosis not present

## 2018-08-08 DIAGNOSIS — N400 Enlarged prostate without lower urinary tract symptoms: Secondary | ICD-10-CM | POA: Diagnosis present

## 2018-08-08 DIAGNOSIS — R1084 Generalized abdominal pain: Secondary | ICD-10-CM | POA: Diagnosis not present

## 2018-08-08 DIAGNOSIS — I340 Nonrheumatic mitral (valve) insufficiency: Secondary | ICD-10-CM | POA: Diagnosis not present

## 2018-08-08 DIAGNOSIS — R29702 NIHSS score 2: Secondary | ICD-10-CM | POA: Diagnosis not present

## 2018-08-08 DIAGNOSIS — G459 Transient cerebral ischemic attack, unspecified: Secondary | ICD-10-CM | POA: Diagnosis not present

## 2018-08-08 DIAGNOSIS — R4781 Slurred speech: Secondary | ICD-10-CM | POA: Diagnosis not present

## 2018-08-08 LAB — COMPREHENSIVE METABOLIC PANEL
ALBUMIN: 3.2 g/dL — AB (ref 3.5–5.0)
ALT: 12 U/L (ref 0–44)
AST: 13 U/L — ABNORMAL LOW (ref 15–41)
Alkaline Phosphatase: 49 U/L (ref 38–126)
Anion gap: 8 (ref 5–15)
BUN: 24 mg/dL — ABNORMAL HIGH (ref 8–23)
CHLORIDE: 104 mmol/L (ref 98–111)
CO2: 24 mmol/L (ref 22–32)
Calcium: 8.8 mg/dL — ABNORMAL LOW (ref 8.9–10.3)
Creatinine, Ser: 1.29 mg/dL — ABNORMAL HIGH (ref 0.61–1.24)
GFR calc Af Amer: 55 mL/min — ABNORMAL LOW (ref >60)
GFR calc non Af Amer: 48 mL/min — ABNORMAL LOW (ref >60)
GLUCOSE: 98 mg/dL (ref 70–99)
POTASSIUM: 3.9 mmol/L (ref 3.5–5.1)
SODIUM: 136 mmol/L (ref 135–145)
Total Bilirubin: 0.6 mg/dL (ref 0.3–1.2)
Total Protein: 6.3 g/dL — ABNORMAL LOW (ref 6.5–8.1)

## 2018-08-08 LAB — CBC WITH DIFFERENTIAL/PLATELET
BASOS PCT: 0 %
Basophils Absolute: 0 10*3/uL (ref 0.0–0.1)
EOS ABS: 0.5 10*3/uL (ref 0.0–0.7)
EOS PCT: 3 %
HCT: 40 % (ref 39.0–52.0)
Hemoglobin: 13 g/dL (ref 13.0–17.0)
Lymphocytes Relative: 19 %
Lymphs Abs: 2.8 10*3/uL (ref 0.7–4.0)
MCH: 28.7 pg (ref 26.0–34.0)
MCHC: 32.5 g/dL (ref 30.0–36.0)
MCV: 88.3 fL (ref 78.0–100.0)
MONO ABS: 1.4 10*3/uL — AB (ref 0.1–1.0)
MONOS PCT: 9 %
Neutro Abs: 10.1 10*3/uL — ABNORMAL HIGH (ref 1.7–7.7)
Neutrophils Relative %: 69 %
PLATELETS: 218 10*3/uL (ref 150–400)
RBC: 4.53 MIL/uL (ref 4.22–5.81)
RDW: 15.5 % (ref 11.5–15.5)
WBC: 14.7 10*3/uL — ABNORMAL HIGH (ref 4.0–10.5)

## 2018-08-08 LAB — URINALYSIS, ROUTINE W REFLEX MICROSCOPIC
Bilirubin Urine: NEGATIVE
Glucose, UA: NEGATIVE mg/dL
Hgb urine dipstick: NEGATIVE
Ketones, ur: NEGATIVE mg/dL
LEUKOCYTES UA: NEGATIVE
Nitrite: NEGATIVE
Protein, ur: NEGATIVE mg/dL
SPECIFIC GRAVITY, URINE: 1.01 (ref 1.005–1.030)
pH: 7 (ref 5.0–8.0)

## 2018-08-08 LAB — TROPONIN I
Troponin I: 0.03 ng/mL (ref <0.03)
Troponin I: 0.03 ng/mL (ref <0.03)

## 2018-08-08 MED ORDER — IOPAMIDOL (ISOVUE-370) INJECTION 76%
100.0000 mL | Freq: Once | INTRAVENOUS | Status: AC | PRN
  Administered 2018-08-08: 100 mL via INTRAVENOUS

## 2018-08-08 MED ORDER — LIDOCAINE HCL URETHRAL/MUCOSAL 2 % EX GEL
CUTANEOUS | Status: AC
  Filled 2018-08-08: qty 20

## 2018-08-08 NOTE — ED Triage Notes (Signed)
Arrived via [facility] for back pain. Hx back pain for 2 yrs. [facility].

## 2018-08-08 NOTE — Telephone Encounter (Signed)
Spoke w/ Ludwig Clarks, Pt's son- home health referral placed for PT and nursing.

## 2018-08-08 NOTE — Telephone Encounter (Signed)
Copied from [place] [id number]. Topic: Inquiry >> Aug 08, 2018  3:05 PM Oliver Pila B wrote: Reason for CRM: pt's wife called to inform pcp that pt had fallen and has been seen in the ER they are asking for help for the pt to be seen for home health services; contact to advise; pt's wife notified that pcp is not in office for the rest of the week

## 2018-08-08 NOTE — ED Notes (Signed)
Dressing applied to skin tear to right elbow.

## 2018-08-08 NOTE — ED Notes (Signed)
Patient transported to X-ray 

## 2018-08-08 NOTE — ED Notes (Signed)
Patient ambulated into hallway and turned around with 2 assist.

## 2018-08-08 NOTE — Discharge Instructions (Addendum)
Your work-up today did not show evidence of acute fracture or dislocation in your back.  Old injuries were seen.  Your aorta was found to have the similar stenosis to prior.  The CT surgery team felt you need to follow-up with vascular surgery as an outpatient.  Please call to set up that appointment.  Please follow-up with urology for your Foley catheter which was placed when we discovered the urinary retention.  I suspect this is contributing to your symptoms.  Please follow-up with your PCP for further management overall.  If any symptoms change or worsen, please return to the nearest emergency department.

## 2018-08-08 NOTE — ED Provider Notes (Signed)
9:05 AM Care assumed from Dr. Wyvonnia Dusky.  At time of transfer care, patient is awaiting results of CT imaging.   Patient has a history of AAA status post repair in 1996, CKD, CAD status post CABG, hyperlipidemia, and chronic back pain with recent T-spine kyphoplasty in August who presents with back pain, difficulty with ambulation, and urinary symptoms.  His wife feels that he has had worsened difficulty walking over the last several days and he had a fall.  He reports that his pain is similar to as it is been for the last month but over the last few days he has had worsened urinary symptoms.  He reports that he is having some lower abdominal discomfort, hesitancy, and urgency.  He denies dysuria.  He denies any fevers, chills, chest pain, shortness breath, nausea, or vomiting.  According to Dr. Wyvonnia Dusky, patient was able to ambulate with significant assistance with a person on both sides.  Patient did not have numbness or weakness in his legs and had normal pulses distally.  Patient had a CT dissection study which revealed irregular to the aorta of either chronic dissection and plaque or chronic stenosis.  The abnormalities appear to be more in the chest and the abdomen.  Radiology was called by me who feels that this is likely a chronic problem however it could be causing some of his symptoms.  No new fracture or dislocation was seen on the muscle skeletal portion of the imaging.  Patient had greater than 350 cc in his bladder after void, Foley catheter will be placed for retention.  Due to the irregularity on the aorta scan, CT surgery will be called for recommendations.  Patient reports he is on a baby aspirin every day and had a remote surgery on his aorta greater than 20 years ago.  Chart review shows that it was an abdominal aortic aneurysm repair in 1996 by Dr. [name].  He has not seen a vascular surgeon recently.  11:44 AM Spoke with CT surgery who looked at the images.  They felt that this was  not an acute problem with his aorta.  They felt that there is nothing to do emergently but he needs to follow-up with vascular surgery as an outpatient.  Patient given follow-up instructions with vascular.  Next  Patient was able ambulate with assistance to the restroom.  His Foley catheter was in place draining his bladder and he was feeling better from the abdominal discomfort intention standpoint.  Based on his well appearance and improved symptoms, patient will be discharged home with plans to follow with both urology for the catheter, vascular surgery for the aorta, his spine team for his back pain, and his PCP for overall management.  Patient understood return precautions for any worsened mobility or new problems.  He and his family had no other questions or concerns and patient was discharged in good condition.    Clinical Impression: 1. Fall, initial encounter   2. Compression fracture of T5 vertebra with routine healing, subsequent encounter   3. Urinary retention   4. Stenosis of aorta     Disposition: Discharge  Condition: Good  I have discussed the results, Dx and Tx plan with the pt(& family if present). He/she/they expressed understanding and agree(s) with the plan. Discharge instructions discussed at great length. Strict return precautions discussed and pt &/or family have verbalized understanding of the instructions. No further questions at time of discharge.    New Prescriptions   No medications on file  Follow Up: [name], MD [place] [address] [place] [phone] Schedule an appointment as soon as possible for a visit  Schedule an appointment to follow-up for your Foley catheter management.  [address], [place] [address] [phone]  Schedule an appointment as soon as possible for a  visit  Schedule appointment to follow-up and get reestablished for your chronic aortic problems.      Caidon Foti, Gwenyth Allegra, MD 08/08/18 [phone]

## 2018-08-08 NOTE — ED Triage Notes (Signed)
VS pta for EMS 162/60, HR 64, R 16, SPO2 98%RA. Pt AO x 4 NAD noticed.

## 2018-08-08 NOTE — ED Triage Notes (Signed)
Pt arrives via EMS, c/o chronic back pain and 2 weeks of weakness in bilateral legs. Pt uses cane and walker to assist him to ambulate. Pt wife says starting yesterday he has had worsening issues with ambulation. Denies fevers. No meds PTA.

## 2018-08-08 NOTE — ED Provider Notes (Signed)
[facility] EMERGENCY DEPARTMENT Provider Note   CSN: [id number] Arrival date & time: 08/08/18  0355     History   Chief Complaint Chief Complaint  Patient presents with  . Back Pain    HPI Troy Ray is a 84 y.o. male.  Patient presents with generalized weakness and back pain.  He has a history of chronic back pain status post kyphoplasty at T5.  His wife states he was having more difficulty walking over the past several days.  He normally walks with a cane or walker.  Patient denies any new pain.  He did have a fall today when attempting get out of bed and his wife was unable to get him up so she called EMS.  Denies hitting head or losing consciousness.  Complains of paraspinal thoracic pain which is his normal location.  Denies any new focal weakness, numbness or tingling.  Has had some issues with urinary difficulty recently.  No history of bowel incontinence.  No fever, chills, nausea or vomiting.  No chest pain or shortness of breath. Patient taking Tylenol at home for his back pain normally.  Wife reports 3 days of increased urinary frequency, urgency and dysuria. Also with a few episodes of urinary incontinence. Does have BPH listed in chart.  The history is provided by the patient, the EMS personnel and the spouse.  Back Pain   Pertinent negatives include no chest pain, no fever, no headaches, no abdominal pain, no dysuria and no weakness.    Past Medical History:  Diagnosis Date  . Adenomatous colon polyp   . BPH (benign prostatic hyperplasia) 07/14/2013  . CAD (coronary artery disease)   . Carotid stenosis, bilateral    right 60-79% stenosed.  Left 40-59%  . Chronic kidney disease    chronic, stage II  . CRI (chronic renal insufficiency)   . Diverticulosis   . Dysphagia   . Esophageal stricture    Dr [name]  . Glaucoma   . Headache(784.0) 12/01/2013  . Hyperlipidemia   . Hypertension   . Insomnia 05/12/2015  . Low back pain syndrome 04/03/2014  .  Medicare annual wellness visit, subsequent 02/04/2015  . Memory loss   . Postural lightheadedness   . PVD (peripheral vascular disease) ([place])   . Renal insufficiency   . Sensorineural hearing loss, bilateral   . Unspecified constipation 12/29/2012    Patient Active Problem List   Diagnosis Date Noted  . Vertebral fracture, osteoporotic ([place]) 07/23/2018  . T6 vertebral fracture ([place]) 09/12/2017  . Leukocytosis 09/12/2017  . Sun-damaged skin 06/27/2017  . Gait disturbance 05/08/2017  . Low back pain 05/08/2017  . Anorexia 12/12/2015  . Skin lesion of left arm 05/12/2015  . Gastroesophageal reflux disease without esophagitis 05/12/2015  . Insomnia 05/12/2015  . Preventative health care 02/04/2015  . Glaucoma 01/25/2015  . Left-sided low back pain with left-sided sciatica 04/03/2014  . Apnea 01/25/2014  . Headache 12/01/2013  . Sinusitis 10/13/2013  . BPH (benign prostatic hyperplasia) 07/14/2013  . Constipation 12/29/2012  . LACK OF COORDINATION 11/17/2010  . Hyperglycemia 11/17/2010  . PERSONAL HISTORY OF COLONIC POLYPS 09/21/2009  . Carotid artery disease ([place]) 05/11/2009  . MEMORY LOSS 04/06/2009  . Hyperlipidemia, mixed 12/07/2008  . Essential hypertension 12/07/2008  . Coronary atherosclerosis 12/07/2008  . History of AAA (abdominal aortic aneurysm) repair 12/04/2008  . CHRONIC KIDNEY DISEASE STAGE II (MILD) 12/04/2008  . ESOPHAGEAL STRICTURE 05/29/2008  . OTHER DYSPHAGIA 05/29/2008    Past Surgical History:  Procedure Laterality Date  . ABDOMINAL AORTIC ANEURYSM REPAIR  1996   Dr [name]  . CORONARY ARTERY BYPASS GRAFT    . EYE SURGERY     cataracts b/l and laser  . HERNIA REPAIR  03/23/05   left inguinal Lichtenstein repair, with mesh.  [name]  . IR [facility] THORACIC WITH BONE BIOPSY  11/08/2017  . IR RADIOLOGIST EVAL & MGMT  10/22/2017  . IR VERTEBROPLASTY CERV/THOR BX INC UNI/BIL INC/INJECT/IMAGING  06/25/2018  . RENAL ARTERY STENT  1996   Dr [name].   Bilateral stenting        Home Medications    Prior to Admission medications   Medication Sig Start Date End Date Taking? Authorizing Provider  amLODipine (NORVASC) 5 MG tablet TAKE 1 TABLET TWICE DAILY 07/25/18   Carollee Herter, Alferd Apa, DO  aspirin EC 81 MG EC tablet Take 81 mg by mouth at bedtime.  02/21/11   Shawna Orleans, [name], DO  atorvastatin (LIPITOR) 40 MG tablet TAKE 1 TABLET EVERY DAY 07/24/18   [name], MD  finasteride (PROSCAR) 5 MG tablet TAKE 1 TABLET EVERY DAY 07/25/18   Carollee Herter, Alferd Apa, DO  latanoprost (XALATAN) 0.005 % ophthalmic solution Place 1 drop into both eyes at bedtime. 08/13/17   [provider]  losartan (COZAAR) 50 MG tablet TAKE 1 TABLET TWICE DAILY 07/16/18   [name], MD  metoprolol succinate (TOPROL-XL) 50 MG 24 hr tablet Take 1 tablet (50 mg total) by mouth every morning. Take with or immediately following a meal. 07/23/18   [name], MD  pantoprazole (PROTONIX) 40 MG tablet Take 1 tablet (40 mg total) by mouth every morning. 07/23/18   [name], MD  predniSONE (DELTASONE) 10 MG tablet Take 4 tabs ;po qd x 3 days then TAKE 3 TABLETS PO QD FOR 3 DAYS THEN TAKE 2 TABLETS PO QD FOR 3 DAYS THEN TAKE 1 TABLET PO QD 07/23/18   [name], MD  timolol (TIMOPTIC) 0.5 % ophthalmic solution Place 1 drop into both eyes daily.  02/25/15   [provider]  tiZANidine (ZANAFLEX) 2 MG tablet Take 1 tablet (2 mg total) by mouth every 8 (eight) hours as needed for muscle spasms. 07/23/18   [name], MD    Family History Family History  Problem Relation Age of Onset  . Heart disease Father        CAD  . Coronary artery disease Father   . Stroke Father   . Hearing loss Father   . Hearing loss Paternal Grandfather   . Heart disease Son   . Heart disease Brother        MI  . Cancer Neg Hx   . Diabetes Neg Hx     Social History Social History   Tobacco Use  . Smoking status: Former Research scientist (life sciences)  . Smokeless tobacco:  Never Used  . Tobacco comment: quit 1980  Substance Use Topics  . Alcohol use: Yes    Alcohol/week: 1.0 standard drinks    Types: 1 Glasses of wine per week  . Drug use: No     Allergies   Patient has no known allergies.   Review of Systems Review of Systems  Constitutional: Negative for activity change, appetite change and fever.  Respiratory: Negative for cough, chest tightness and shortness of breath.   Cardiovascular: Negative for chest pain and leg swelling.  Gastrointestinal: Negative for abdominal pain, nausea and vomiting.  Genitourinary: Negative  for dysuria and hematuria.  Musculoskeletal: Positive for arthralgias, back pain and myalgias.  Skin: Negative for rash.  Neurological: Negative for dizziness, weakness and headaches.    all other systems are negative except as noted in the HPI and PMH.    Physical Exam Updated Vital Signs BP (!) 166/53 (BP Location: Right Arm)   Pulse (!) 58   Temp 98.4 F (36.9 C) (Oral)   Resp 18   Ht 5\' 10"  (1.778 m)   Wt 64.8 kg   SpO2 96%   BMI 20.50 kg/m   Physical Exam  Constitutional: He is oriented to person, place, and time. He appears well-developed and well-nourished. No distress.  HENT:  Head: Normocephalic and atraumatic.  Mouth/Throat: Oropharynx is clear and moist. No oropharyngeal exudate.  Eyes: Pupils are equal, round, and reactive to light. Conjunctivae and EOM are normal.  Neck: Normal range of motion. Neck supple.  No C-spine tenderness  Cardiovascular: Normal rate, regular rhythm and intact distal pulses.  Murmur heard. Holosystolic murmur present  Femoral pulses intact  Pulmonary/Chest: Effort normal and breath sounds normal. No respiratory distress. He exhibits no tenderness.  Abdominal: Soft. There is no tenderness. There is no rebound and no guarding.  Musculoskeletal: Normal range of motion. He exhibits tenderness. He exhibits no edema.  Paraspinal thoracic tenderness on the right, no midline T or  L-spine tenderness  Equal strength in lower extremities.  Full range of motion of hips and ankles bilaterally  Neurological: He is alert and oriented to person, place, and time. No cranial nerve deficit. He exhibits normal muscle tone. Coordination normal.  No ataxia on finger to nose bilaterally. No pronator drift. 5/5 strength throughout. CN 2-12 intact.Equal grip strength. Sensation intact.   Skin: Skin is warm.  Psychiatric: He has a normal mood and affect. His behavior is normal.  Nursing note and vitals reviewed.    ED Treatments / Results  Labs (all labs ordered are listed, but only abnormal results are displayed) Labs Reviewed  CBC WITH DIFFERENTIAL/PLATELET - Abnormal; Notable for the following components:      Result Value   WBC 14.7 (*)    Neutro Abs 10.1 (*)    Monocytes Absolute 1.4 (*)    All other components within normal limits  COMPREHENSIVE METABOLIC PANEL - Abnormal; Notable for the following components:   BUN 24 (*)    Creatinine, Ser 1.29 (*)    Calcium 8.8 (*)    Total Protein 6.3 (*)    Albumin 3.2 (*)    AST 13 (*)    GFR calc non Af Amer 48 (*)    GFR calc Af Amer 55 (*)    All other components within normal limits  TROPONIN I - Abnormal; Notable for the following components:   Troponin I 0.03 (*)    All other components within normal limits  URINALYSIS, ROUTINE W REFLEX MICROSCOPIC  TROPONIN I    EKG EKG Interpretation  Date/Time:  Thursday August 08 2018 04:15:18 EDT Ventricular Rate:  60 PR Interval:    QRS Duration: 86 QT Interval:  416 QTC Calculation: 416 R Axis:   8 Text Interpretation:  Sinus rhythm Left atrial enlargement Borderline T abnormalities, inferior leads Nonspecific T wave abnormality Confirmed by Ezequiel Essex [phone]) on 08/08/2018 4:51:27 AM   Radiology Dg Chest 2 View  Result Date: 08/08/2018 CLINICAL DATA:  Fall this morning. EXAM: CHEST - 2 VIEW COMPARISON:  Chest radiograph 08/20/2014 FINDINGS: Post median  sternotomy. Unchanged heart size and  mediastinal contours. Retrocardiac hiatal hernia. There is aortic atherosclerosis. Left lung base atelectasis or scarring. No confluent airspace disease. No pulmonary edema. No large pleural effusion or pneumothorax. No visualized rib fracture. Kyphoplasty within mid upper thoracic vertebra. IMPRESSION: 1. No acute or traumatic findings. 2. Hiatal hernia. Electronically Signed   By: Keith Rake M.D.   On: 08/08/2018 05:45   Dg Thoracic Spine 2 View  Result Date: 08/08/2018 CLINICAL DATA:  84 y/o M; chronic back pain and bilateral leg weakness for 2 weeks. EXAM: THORACIC SPINE 2 VIEWS COMPARISON:  08/08/2018 chest radiograph. FINDINGS: Stable T5-6 chronic compression deformities post kyphoplasty. No acute fracture or malalignment. Aortic calcific atherosclerosis. Normal thoracic kyphosis without listhesis. Discogenic degenerative changes with small endplate marginal osteophytes. IMPRESSION: Stable T5-6 chronic compression deformities post kyphoplasty. No acute osseous abnormality identified. Electronically Signed   By: Kristine Garbe M.D.   On: 08/08/2018 06:12   Dg Lumbar Spine Complete  Result Date: 08/08/2018 CLINICAL DATA:  Fall.  Chronic back pain.  Bilateral leg weakness. EXAM: LUMBAR SPINE - COMPLETE 4+ VIEW COMPARISON:  Lumbar spine MRI 05/04/2018 FINDINGS: There are 6 non-rib-bearing lumbar vertebra. The alignment is maintained. Vertebral body heights are normal. There is no listhesis. The posterior elements are intact. No fracture. Endplate spurring at multiple levels with minor disc space narrowing L2-L3 and L1-L2. Facet arthropathy in the lower lumbar spine. Sacroiliac joints are congruent. IMPRESSION: Degenerative change in the lumbar spine without acute fracture. Electronically Signed   By: Keith Rake M.D.   On: 08/08/2018 06:13   Ct Angio Chest/abd/pel For Dissection W And/or Wo Contrast  Result Date: 08/08/2018 CLINICAL DATA:  Chest  pain, back pain, leg weakness. EXAM: CT ANGIOGRAPHY CHEST, ABDOMEN AND PELVIS TECHNIQUE: Multidetector CT imaging through the chest, abdomen and pelvis was performed using the standard protocol during bolus administration of intravenous contrast. Multiplanar reconstructed images and MIPs were obtained and reviewed to evaluate the vascular anatomy. CONTRAST:  124mL ISOVUE-370 IOPAMIDOL (ISOVUE-370) INJECTION 76% COMPARISON:  CT abdomen and pelvis 09/14/2017 FINDINGS: CTA CHEST FINDINGS Cardiovascular: There is marked luminal narrowing within the proximal descending thoracic aorta. Slight dilatation of the descending thoracic aorta in this area. This measures maximally 3.4 cm in the mid descending thoracic aorta. The marked luminal narrowing could be due to thrombosed chronic dissection or severe irregular plaque formation. Heart is normal size. Mediastinum/Nodes: No mediastinal, hilar, or axillary adenopathy. Large hiatal hernia. Lungs/Pleura: Small subpleural nodule in the right upper lobe measures 3-4 mm on image 25. Compressive atelectasis in the left lower lobe adjacent to the hiatal hernia. No effusions or confluent airspace opacities. Musculoskeletal: Chest wall soft tissues are unremarkable. No acute bony abnormality. Compression deformities at T5 and T6 with prior vertebroplasty changes. Review of the MIP images confirms the above findings. CTA ABDOMEN AND PELVIS FINDINGS VASCULAR Aorta: Markedly irregular plaque formation throughout the abdominal aorta. No aneurysm. Maximum diameter 2.7 cm distally. Changes of remote bypass from the distal aorta to the external iliac arteries. Celiac: Widely patent SMA: Irregular plaque formation and luminal narrowing noted in the proximal superior mesenteric artery. Patent. Luminal narrowing is likely greater than 50%. Renals: Moderate narrowing of the left renal artery, likely 50% or greater. No significant stenosis on the right. IMA: Occluded at its origin with  reconstitution. Inflow: Aorto bi-iliac bypass widely patent. Native common iliac arteries occluded. Veins: Grossly unremarkable. Review of the MIP images confirms the above findings. NON-VASCULAR Hepatobiliary: No focal hepatic abnormality. Gallbladder unremarkable. Pancreas: No focal abnormality or  ductal dilatation. Spleen: No focal abnormality.  Normal size. Adrenals/Urinary Tract: No adrenal mass. Small cyst in the midpole of the left kidney. No hydronephrosis. Urinary bladder grossly unremarkable. Stomach/Bowel: Sigmoid diverticulosis. No active diverticulitis. No evidence of bowel obstruction. Lymphatic: No adenopathy Reproductive: Prostate enlargement with central calcifications. Other: No free fluid or free air. Musculoskeletal: Degenerative changes in the lumbar spine. No acute bony abnormality. Review of the MIP images confirms the above findings. IMPRESSION: Marked luminal irregularity and narrowing within the proximal and mid descending thoracic aorta. Aneurysm at this level mildly aneurysmal at 3.4 cm. The luminal narrowing could be related to markedly irregular plaque formation or chronic thrombosed dissection. Luminal irregularity in the abdominal aorta without aneurysm. Prior aortoiliac bypass from the distal aorta to the external iliac arteries. Small subpleural nodule in the right upper lobe, 3-4 mm. No follow-up needed if patient is low-risk. Non-contrast chest CT can be considered in 12 months if patient is high-risk. This recommendation follows the consensus statement: Guidelines for Management of Incidental Pulmonary Nodules Detected on CT Images: From the Fleischner Society 2017; Radiology 2017; 284:228-243. Large hiatal hernia. Sigmoid diverticulosis.  No active diverticulitis. Prostate enlargement. Electronically Signed   By: Rolm Baptise M.D.   On: 08/08/2018 08:25    Procedures Procedures (including critical care time)  Medications Ordered in ED Medications  iopamidol (ISOVUE-370)  76 % injection 100 mL (has no administration in time range)     Initial Impression / Assessment and Plan / ED Course  I have reviewed the triage vital signs and the nursing notes.  Pertinent labs & imaging results that were available during my care of the patient were reviewed by me and considered in my medical decision making (see chart for details).     Patient presents after fall.  Denies any new pain.  Has pain in his right upper back at site of previous pain.  Denies any new weakness, numbness, tingling.  No areas of new pain.  Did not hit head or lose consciousness.  No chest pain or shortness of breath.  EKG is nonischemic.  Patient has murmur on exam which he does not recall being told about in the past.  Xrays are negative for acute fractures.  No new weakness, numbness, or pain.  Labs show minimal stable troponin elevation. No chest pain.  With ongoing upper back pain and possible new murmur, CTA will be obtained to evaluate for aortic dissection. Patient with 350 cc on bladder scan and foley catheter will be placed.  UA negative.   MRI in June showed compression fractures of T5and T6 as well as irregularity of aorta, and severe lumbar canal and foraminal stenosis.   Patient able to ambulate with some assistance and denies any areas of new pain.  Dr. Sherry Ruffing to assume care pending CT scan results.   Final Clinical Impressions(s) / ED Diagnoses   Final diagnoses:  None    ED Discharge Orders    None       Jerett Odonohue, Annie Main, MD 08/08/18 [phone]

## 2018-08-08 NOTE — ED Notes (Signed)
Bed: VH84 Expected date:  Expected time:  Means of arrival:  Comments: 84 yr old abdominal pain

## 2018-08-08 NOTE — ED Notes (Signed)
Foley cath drainage changed tro a leg bag as per EDP verbal order.  Instruction given to wife.  Wife verbalized understanding.

## 2018-08-09 ENCOUNTER — Encounter (HOSPITAL_COMMUNITY): Payer: Self-pay

## 2018-08-09 DIAGNOSIS — M549 Dorsalgia, unspecified: Secondary | ICD-10-CM | POA: Diagnosis present

## 2018-08-09 MED ORDER — ENOXAPARIN SODIUM 40 MG/0.4ML ~~LOC~~ SOLN
40.0000 mg | SUBCUTANEOUS | Status: DC
  Administered 2018-08-09 – 2018-08-16 (×8): 40 mg via SUBCUTANEOUS
  Filled 2018-08-09 (×8): qty 0.4

## 2018-08-09 MED ORDER — AMLODIPINE BESYLATE 5 MG PO TABS
5.0000 mg | ORAL_TABLET | Freq: Two times a day (BID) | ORAL | Status: DC
  Administered 2018-08-09 – 2018-08-13 (×9): 5 mg via ORAL
  Filled 2018-08-09 (×9): qty 1

## 2018-08-09 MED ORDER — ATORVASTATIN CALCIUM 40 MG PO TABS
40.0000 mg | ORAL_TABLET | Freq: Every day | ORAL | Status: DC
  Administered 2018-08-09 – 2018-08-23 (×15): 40 mg via ORAL
  Filled 2018-08-09 (×15): qty 1

## 2018-08-09 MED ORDER — TRAMADOL HCL 50 MG PO TABS
100.0000 mg | ORAL_TABLET | Freq: Four times a day (QID) | ORAL | Status: DC | PRN
  Administered 2018-08-09 – 2018-08-16 (×7): 100 mg via ORAL
  Filled 2018-08-09 (×7): qty 2

## 2018-08-09 MED ORDER — FINASTERIDE 5 MG PO TABS
5.0000 mg | ORAL_TABLET | Freq: Every day | ORAL | Status: DC
  Administered 2018-08-09 – 2018-08-23 (×15): 5 mg via ORAL
  Filled 2018-08-09 (×15): qty 1

## 2018-08-09 MED ORDER — KETOROLAC TROMETHAMINE 15 MG/ML IJ SOLN
15.0000 mg | Freq: Three times a day (TID) | INTRAMUSCULAR | Status: DC | PRN
  Administered 2018-08-09 – 2018-08-13 (×3): 15 mg via INTRAVENOUS
  Filled 2018-08-09 (×4): qty 1

## 2018-08-09 MED ORDER — TIMOLOL MALEATE 0.5 % OP SOLN
1.0000 [drp] | Freq: Every day | OPHTHALMIC | Status: DC
  Administered 2018-08-09 – 2018-08-23 (×14): 1 [drp] via OPHTHALMIC
  Filled 2018-08-09: qty 5

## 2018-08-09 MED ORDER — SODIUM CHLORIDE 0.9 % IV SOLN
INTRAVENOUS | Status: DC
  Administered 2018-08-09 – 2018-08-13 (×4): via INTRAVENOUS

## 2018-08-09 MED ORDER — LATANOPROST 0.005 % OP SOLN
1.0000 [drp] | Freq: Every day | OPHTHALMIC | Status: DC
  Administered 2018-08-09 – 2018-08-23 (×13): 1 [drp] via OPHTHALMIC
  Filled 2018-08-09: qty 2.5

## 2018-08-09 MED ORDER — HYDROCODONE-ACETAMINOPHEN 5-325 MG PO TABS
2.0000 | ORAL_TABLET | Freq: Once | ORAL | Status: AC
  Administered 2018-08-09: 2 via ORAL
  Filled 2018-08-09: qty 2

## 2018-08-09 MED ORDER — METOPROLOL SUCCINATE ER 25 MG PO TB24
50.0000 mg | ORAL_TABLET | Freq: Every morning | ORAL | Status: DC
  Administered 2018-08-09 – 2018-08-18 (×10): 50 mg via ORAL
  Filled 2018-08-09: qty 2
  Filled 2018-08-09: qty 1
  Filled 2018-08-09 (×2): qty 2
  Filled 2018-08-09: qty 1
  Filled 2018-08-09 (×3): qty 2
  Filled 2018-08-09 (×3): qty 1

## 2018-08-09 MED ORDER — LOSARTAN POTASSIUM 50 MG PO TABS
50.0000 mg | ORAL_TABLET | Freq: Two times a day (BID) | ORAL | Status: DC
  Administered 2018-08-09 – 2018-08-20 (×22): 50 mg via ORAL
  Filled 2018-08-09 (×23): qty 1

## 2018-08-09 MED ORDER — PANTOPRAZOLE SODIUM 40 MG PO TBEC
40.0000 mg | DELAYED_RELEASE_TABLET | Freq: Every day | ORAL | Status: DC
  Administered 2018-08-09 – 2018-08-23 (×15): 40 mg via ORAL
  Filled 2018-08-09 (×16): qty 1

## 2018-08-09 MED ORDER — TIZANIDINE HCL 4 MG PO TABS
2.0000 mg | ORAL_TABLET | Freq: Three times a day (TID) | ORAL | Status: DC | PRN
  Administered 2018-08-09: 2 mg via ORAL
  Filled 2018-08-09: qty 1

## 2018-08-09 MED ORDER — KETOROLAC TROMETHAMINE 60 MG/2ML IM SOLN
30.0000 mg | Freq: Once | INTRAMUSCULAR | Status: AC
  Administered 2018-08-09: 30 mg via INTRAMUSCULAR
  Filled 2018-08-09: qty 2

## 2018-08-09 MED ORDER — ASPIRIN EC 81 MG PO TBEC
81.0000 mg | DELAYED_RELEASE_TABLET | Freq: Every day | ORAL | Status: DC
  Administered 2018-08-09 – 2018-08-23 (×13): 81 mg via ORAL
  Filled 2018-08-09 (×14): qty 1

## 2018-08-09 NOTE — Evaluation (Signed)
Occupational Therapy Evaluation Patient Details Name: Troy Ray MRN: [id number] DOB: 06-12-1928 Today's Date: 08/09/2018    History of Present Illness 84 yo male admitted with severe back pain. Hx of comp fx-s/p thoracic KP, CAD, CKD, hearing loss.    Clinical Impression   Pt was admitted for the above. Pain was tolerable during OT evaluation, and he was premedicated. Pt had 4 LOB posteriorly and to Left during sit to stand  [place], and he needs cues and assistance to safely complete turns for transfers to [facility] and chair. Pt is a HIGH FALL RISK.  Pt will need STSNF prior to home to work on strength and balance    Follow Up Recommendations  SNF    Equipment Recommendations  3 in 1 bedside commode    Recommendations for Other Services       Precautions / Restrictions Precautions Precautions: Fall Restrictions Weight Bearing Restrictions: No      Mobility Bed Mobility Overal bed mobility: Needs Assistance Bed Mobility: Supine to Sit     Supine to sit: Min guard;HOB elevated     General bed mobility comments: Increased time. Close guard for safety. Cues for technique.   Transfers Overall transfer level: Needs assistance Equipment used: Rolling walker (2 wheeled) Transfers: Sit to/from Stand Sit to Stand: Mod assist;From elevated surface;Min assist         General transfer comment: Min-Mod assist to rise, stabilize. LOB multiple times during sit to stand transitions requiring therapist asssistance to prevent fall. VCs safety, hand placement    Balance Overall balance assessment: Needs assistance;History of Falls         Standing balance support: Bilateral upper extremity supported Standing balance-Leahy Scale: Poor                             ADL either performed or assessed with clinical judgement   ADL Overall ADL's : Needs assistance/impaired Eating/Feeding: Independent   Grooming: Set up;Sitting    Upper Body Bathing: Set up;Sitting   Lower Body Bathing: Moderate assistance;Sit to/from stand   Upper Body Dressing : Minimal assistance   Lower Body Dressing: Maximal assistance;Sit to/from stand   Toilet Transfer: Moderate assistance;Ambulation;BSC;RW   Toileting- Clothing Manipulation and Hygiene: Moderate assistance;Sit to/from stand         General ADL Comments: Pt with multiple LOB during sit to stand.  Tends to release walker and try to step to destination without getting close enough     Vision         Perception     Praxis      Pertinent Vitals/Pain Pain Assessment: Faces Faces Pain Scale: Hurts little more Pain Location: back Pain Descriptors / Indicators: Aching;Sore;Discomfort;Moaning Pain Intervention(s): Limited activity within patient's tolerance;Monitored during session;Premedicated before session;Repositioned     Hand Dominance     Extremity/Trunk Assessment Upper Extremity Assessment Upper Extremity Assessment: Generalized weakness   Lower Extremity Assessment Lower Extremity Assessment: Generalized weakness   Cervical / Trunk Assessment Cervical / Trunk Assessment: Kyphotic   Communication Communication Communication: HOH   Cognition Arousal/Alertness: Awake/alert Behavior During Therapy: WFL for tasks assessed/performed Overall Cognitive Status: Within Functional Limits for tasks assessed                                 General Comments: cues needed for safety:  pt aware of decreased balance but  unable to self correct.  Does not turn completely toward surface wtihout cues   General Comments       Exercises     Shoulder Instructions      Home Living Family/patient expects to be discharged to:: Skilled nursing facility Living Arrangements: Spouse/significant other Available Help at Discharge: Family Type of Home: House                       Home Equipment: Walker - 2 wheels   Additional Comments: has  regular commode at home and walk in shower without a seat      Prior Functioning/Environment Level of Independence: Independent with assistive device(s)        Comments: RW and cane        OT Problem List: Decreased strength;Decreased activity tolerance;Impaired balance (sitting and/or standing);Decreased safety awareness;Pain;Decreased knowledge of use of DME or AE;Decreased knowledge of precautions      OT Treatment/Interventions: Self-care/ADL training;Energy conservation;DME and/or AE instruction;Patient/family education;Balance training;Therapeutic activities    OT Goals(Current goals can be found in the care plan section) Acute Rehab OT Goals Patient Stated Goal: pain control. to get stronger. OT Goal Formulation: With patient/family Time For Goal Achievement: 08/23/18 Potential to Achieve Goals: Good ADL Goals Pt Will Perform Grooming: with min guard assist;standing Pt Will Perform Lower Body Bathing: with min assist;with adaptive equipment;sit to/from stand Pt Will Perform Lower Body Dressing: with min assist;with adaptive equipment;sit to/from stand Pt Will Transfer to Toilet: with min assist;ambulating;bedside commode Pt Will Perform Toileting - Clothing Manipulation and hygiene: with min assist;sit to/from stand Additional ADL Goal #1: pt will not need any safety cues during adls/toilet transfers  OT Frequency: Min 2X/week   Barriers to D/C:            Co-evaluation              AM-PAC PT "6 Clicks" Daily Activity     Outcome Measure Help from another person eating meals?: None Help from another person taking care of personal grooming?: A Little Help from another person toileting, which includes using toliet, bedpan, or urinal?: A Lot Help from another person bathing (including washing, rinsing, drying)?: A Lot Help from another person to put on and taking off regular upper body clothing?: A Little Help from another person to put on and taking off regular  lower body clothing?: A Lot 6 Click Score: 16   End of Session    Activity Tolerance: Patient tolerated treatment well Patient left: in chair;with call bell/phone within reach;with chair alarm set;with nursing/sitter in room  OT Visit Diagnosis: Unsteadiness on feet (R26.81);Muscle weakness (generalized) (M62.81)                Time: 3790-2409 OT Time Calculation (min): 26 min Charges:  OT General Charges $OT Visit: 1 Visit OT Evaluation $OT Eval Low Complexity: [place], OTR/L Acute Rehabilitation Services [phone] [facility] pager [phone] office 08/09/2018  [place] 08/09/2018, 1:05 PM

## 2018-08-09 NOTE — ED Provider Notes (Signed)
[facility] DEPT Provider Note   CSN: [id number] Arrival date & time: 08/08/18  2324     History   Chief Complaint Chief Complaint  Patient presents with  . Back Pain    HPI Troy Ray is a 84 y.o. male.  Patient is an 84 year old male with past medical history of coronary artery disease, chronic renal insufficiency, hypertension, prior vertebral fracture with kyphoplasty.  He presents today for evaluation of pain in his right upper back.  This is been an ongoing problem for greater than 1 year, however has worsened over the past several days.  This worsened in the absence of any injury or trauma.  He denies any fevers or chills.  There has been no diarrhea or constipation.  According to the son, the patient is unable to move or sit up in bed secondary to his pain.  He is now having difficulty ambulating and the son and wife are unable to care for him at home.  He was seen earlier this evening at [facility] and underwent extensive work-up including CT angios of the chest, abdomen, and pelvis.  This revealed abnormalities in the aorta, however were not felt to be the cause of his discomfort.  The history is provided by the patient.  Back Pain   This is a chronic problem. Episode onset: 1 year ago, worse over the past few days. The problem occurs constantly. The problem has been rapidly worsening. The pain is associated with no known injury. The pain is present in the thoracic spine. The pain does not radiate. The pain is severe. The pain is the same all the time. Pertinent negatives include no fever. He has tried nothing for the symptoms.    Past Medical History:  Diagnosis Date  . Adenomatous colon polyp   . BPH (benign prostatic hyperplasia) 07/14/2013  . CAD (coronary artery disease)   . Carotid stenosis, bilateral    right 60-79% stenosed.  Left 40-59%  . Chronic kidney disease    chronic, stage II  . CRI (chronic renal insufficiency)    . Diverticulosis   . Dysphagia   . Esophageal stricture    Dr [name]  . Glaucoma   . Headache(784.0) 12/01/2013  . Hyperlipidemia   . Hypertension   . Insomnia 05/12/2015  . Low back pain syndrome 04/03/2014  . Medicare annual wellness visit, subsequent 02/04/2015  . Memory loss   . Postural lightheadedness   . PVD (peripheral vascular disease) ([place])   . Renal insufficiency   . Sensorineural hearing loss, bilateral   . Unspecified constipation 12/29/2012    Patient Active Problem List   Diagnosis Date Noted  . Vertebral fracture, osteoporotic ([place]) 07/23/2018  . T6 vertebral fracture ([place]) 09/12/2017  . Leukocytosis 09/12/2017  . Sun-damaged skin 06/27/2017  . Gait disturbance 05/08/2017  . Low back pain 05/08/2017  . Anorexia 12/12/2015  . Skin lesion of left arm 05/12/2015  . Gastroesophageal reflux disease without esophagitis 05/12/2015  . Insomnia 05/12/2015  . Preventative health care 02/04/2015  . Glaucoma 01/25/2015  . Left-sided low back pain with left-sided sciatica 04/03/2014  . Apnea 01/25/2014  . Headache 12/01/2013  . Sinusitis 10/13/2013  . BPH (benign prostatic hyperplasia) 07/14/2013  . Constipation 12/29/2012  . LACK OF COORDINATION 11/17/2010  . Hyperglycemia 11/17/2010  . PERSONAL HISTORY OF COLONIC POLYPS 09/21/2009  . Carotid artery disease ([place]) 05/11/2009  . MEMORY LOSS 04/06/2009  . Hyperlipidemia, mixed 12/07/2008  . Essential hypertension 12/07/2008  .  Coronary atherosclerosis 12/07/2008  . History of AAA (abdominal aortic aneurysm) repair 12/04/2008  . CHRONIC KIDNEY DISEASE STAGE II ([facility]) 12/04/2008  . ESOPHAGEAL STRICTURE 05/29/2008  . OTHER DYSPHAGIA 05/29/2008    Past Surgical History:  Procedure Laterality Date  . ABDOMINAL AORTIC ANEURYSM REPAIR  1996   Dr [name]  . CORONARY ARTERY BYPASS GRAFT    . EYE SURGERY     cataracts b/l and laser  . HERNIA REPAIR  03/23/05   left inguinal Lichtenstein repair, with mesh.  [name]  . IR [facility] THORACIC WITH BONE BIOPSY  11/08/2017  . IR RADIOLOGIST EVAL & MGMT  10/22/2017  . IR VERTEBROPLASTY CERV/THOR BX INC UNI/BIL INC/INJECT/IMAGING  06/25/2018  . RENAL ARTERY STENT  1996   Dr [name].  Bilateral stenting        Home Medications    Prior to Admission medications   Medication Sig Start Date End Date Taking? Authorizing Provider  amLODipine (NORVASC) 5 MG tablet TAKE 1 TABLET TWICE DAILY 07/25/18   Carollee Herter, Alferd Apa, DO  aspirin EC 81 MG EC tablet Take 81 mg by mouth at bedtime.  02/21/11   Shawna Orleans, [name], DO  atorvastatin (LIPITOR) 40 MG tablet TAKE 1 TABLET EVERY DAY 07/24/18   [name], MD  finasteride (PROSCAR) 5 MG tablet TAKE 1 TABLET EVERY DAY 07/25/18   Carollee Herter, Alferd Apa, DO  latanoprost (XALATAN) 0.005 % ophthalmic solution Place 1 drop into both eyes at bedtime. 08/13/17   [provider]  losartan (COZAAR) 50 MG tablet TAKE 1 TABLET TWICE DAILY 07/16/18   [name], MD  metoprolol succinate (TOPROL-XL) 50 MG 24 hr tablet Take 1 tablet (50 mg total) by mouth every morning. Take with or immediately following a meal. 07/23/18   [name], MD  pantoprazole (PROTONIX) 40 MG tablet Take 1 tablet (40 mg total) by mouth every morning. 07/23/18   [name], MD  predniSONE (DELTASONE) 10 MG tablet Take 4 tabs ;po qd x 3 days then TAKE 3 TABLETS PO QD FOR 3 DAYS THEN TAKE 2 TABLETS PO QD FOR 3 DAYS THEN TAKE 1 TABLET PO QD 07/23/18   [name], MD  timolol (TIMOPTIC) 0.5 % ophthalmic solution Place 1 drop into both eyes daily.  02/25/15   [provider]  tiZANidine (ZANAFLEX) 2 MG tablet Take 1 tablet (2 mg total) by mouth every 8 (eight) hours as needed for muscle spasms. 07/23/18   [name], MD    Family History Family History  Problem Relation Age of Onset  . Heart disease Father        CAD  . Coronary artery disease Father   . Stroke Father   . Hearing loss Father   . Hearing loss Paternal  Grandfather   . Heart disease Son   . Heart disease Brother        MI  . Cancer Neg Hx   . Diabetes Neg Hx     Social History Social History   Tobacco Use  . Smoking status: Former Research scientist (life sciences)  . Smokeless tobacco: Never Used  . Tobacco comment: quit 1980  Substance Use Topics  . Alcohol use: Yes    Alcohol/week: 1.0 standard drinks    Types: 1 Glasses of wine per week  . Drug use: No     Allergies   Patient has no known allergies.   Review of Systems Review of Systems  Constitutional: Negative for  fever.  Musculoskeletal: Positive for back pain.  All other systems reviewed and are negative.    Physical Exam Updated Vital Signs BP 137/62 (BP Location: Left Arm)   Pulse 68   Temp 98.1 F (36.7 C) (Oral)   Resp 18   Ht 5\' 10"  (1.778 m)   Wt 64.8 kg   SpO2 98%   BMI 20.50 kg/m   Physical Exam  Constitutional: He is oriented to person, place, and time. He appears well-developed and well-nourished. No distress.  HENT:  Head: Normocephalic and atraumatic.  Mouth/Throat: Oropharynx is clear and moist.  Neck: Normal range of motion. Neck supple.  Cardiovascular: Normal rate and regular rhythm. Exam reveals no friction rub.  No murmur heard. Pulmonary/Chest: Effort normal and breath sounds normal. No respiratory distress. He has no wheezes. He has no rales.  Abdominal: Soft. Bowel sounds are normal. He exhibits no distension. There is no tenderness.  Musculoskeletal: Normal range of motion. He exhibits no edema.  Neurological: He is alert and oriented to person, place, and time. Coordination normal.  Skin: Skin is warm and dry. He is not diaphoretic.  Nursing note and vitals reviewed.    ED Treatments / Results  Labs (all labs ordered are listed, but only abnormal results are displayed) Labs Reviewed - No data to display  EKG None  Radiology Dg Chest 2 View  Result Date: 08/08/2018 CLINICAL DATA:  Fall this morning. EXAM: CHEST - 2 VIEW COMPARISON:  Chest  radiograph 08/20/2014 FINDINGS: Post median sternotomy. Unchanged heart size and mediastinal contours. Retrocardiac hiatal hernia. There is aortic atherosclerosis. Left lung base atelectasis or scarring. No confluent airspace disease. No pulmonary edema. No large pleural effusion or pneumothorax. No visualized rib fracture. Kyphoplasty within mid upper thoracic vertebra. IMPRESSION: 1. No acute or traumatic findings. 2. Hiatal hernia. Electronically Signed   By: Keith Rake M.D.   On: 08/08/2018 05:45   Dg Thoracic Spine 2 View  Result Date: 08/08/2018 CLINICAL DATA:  84 y/o M; chronic back pain and bilateral leg weakness for 2 weeks. EXAM: THORACIC SPINE 2 VIEWS COMPARISON:  08/08/2018 chest radiograph. FINDINGS: Stable T5-6 chronic compression deformities post kyphoplasty. No acute fracture or malalignment. Aortic calcific atherosclerosis. Normal thoracic kyphosis without listhesis. Discogenic degenerative changes with small endplate marginal osteophytes. IMPRESSION: Stable T5-6 chronic compression deformities post kyphoplasty. No acute osseous abnormality identified. Electronically Signed   By: Kristine Garbe M.D.   On: 08/08/2018 06:12   Dg Lumbar Spine Complete  Result Date: 08/08/2018 CLINICAL DATA:  Fall.  Chronic back pain.  Bilateral leg weakness. EXAM: LUMBAR SPINE - COMPLETE 4+ VIEW COMPARISON:  Lumbar spine MRI 05/04/2018 FINDINGS: There are 6 non-rib-bearing lumbar vertebra. The alignment is maintained. Vertebral body heights are normal. There is no listhesis. The posterior elements are intact. No fracture. Endplate spurring at multiple levels with minor disc space narrowing L2-L3 and L1-L2. Facet arthropathy in the lower lumbar spine. Sacroiliac joints are congruent. IMPRESSION: Degenerative change in the lumbar spine without acute fracture. Electronically Signed   By: Keith Rake M.D.   On: 08/08/2018 06:13   Ct Angio Chest/abd/pel For Dissection W And/or Wo  Contrast  Result Date: 08/08/2018 CLINICAL DATA:  Chest pain, back pain, leg weakness. EXAM: CT ANGIOGRAPHY CHEST, ABDOMEN AND PELVIS TECHNIQUE: Multidetector CT imaging through the chest, abdomen and pelvis was performed using the standard protocol during bolus administration of intravenous contrast. Multiplanar reconstructed images and MIPs were obtained and reviewed to evaluate the vascular anatomy. CONTRAST:  191mL  ISOVUE-370 IOPAMIDOL (ISOVUE-370) INJECTION 76% COMPARISON:  CT abdomen and pelvis 09/14/2017 FINDINGS: CTA CHEST FINDINGS Cardiovascular: There is marked luminal narrowing within the proximal descending thoracic aorta. Slight dilatation of the descending thoracic aorta in this area. This measures maximally 3.4 cm in the mid descending thoracic aorta. The marked luminal narrowing could be due to thrombosed chronic dissection or severe irregular plaque formation. Heart is normal size. Mediastinum/Nodes: No mediastinal, hilar, or axillary adenopathy. Large hiatal hernia. Lungs/Pleura: Small subpleural nodule in the right upper lobe measures 3-4 mm on image 25. Compressive atelectasis in the left lower lobe adjacent to the hiatal hernia. No effusions or confluent airspace opacities. Musculoskeletal: Chest wall soft tissues are unremarkable. No acute bony abnormality. Compression deformities at T5 and T6 with prior vertebroplasty changes. Review of the MIP images confirms the above findings. CTA ABDOMEN AND PELVIS FINDINGS VASCULAR Aorta: Markedly irregular plaque formation throughout the abdominal aorta. No aneurysm. Maximum diameter 2.7 cm distally. Changes of remote bypass from the distal aorta to the external iliac arteries. Celiac: Widely patent SMA: Irregular plaque formation and luminal narrowing noted in the proximal superior mesenteric artery. Patent. Luminal narrowing is likely greater than 50%. Renals: Moderate narrowing of the left renal artery, likely 50% or greater. No significant stenosis  on the right. IMA: Occluded at its origin with reconstitution. Inflow: Aorto bi-iliac bypass widely patent. Native common iliac arteries occluded. Veins: Grossly unremarkable. Review of the MIP images confirms the above findings. NON-VASCULAR Hepatobiliary: No focal hepatic abnormality. Gallbladder unremarkable. Pancreas: No focal abnormality or ductal dilatation. Spleen: No focal abnormality.  Normal size. Adrenals/Urinary Tract: No adrenal mass. Small cyst in the midpole of the left kidney. No hydronephrosis. Urinary bladder grossly unremarkable. Stomach/Bowel: Sigmoid diverticulosis. No active diverticulitis. No evidence of bowel obstruction. Lymphatic: No adenopathy Reproductive: Prostate enlargement with central calcifications. Other: No free fluid or free air. Musculoskeletal: Degenerative changes in the lumbar spine. No acute bony abnormality. Review of the MIP images confirms the above findings. IMPRESSION: Marked luminal irregularity and narrowing within the proximal and mid descending thoracic aorta. Aneurysm at this level mildly aneurysmal at 3.4 cm. The luminal narrowing could be related to markedly irregular plaque formation or chronic thrombosed dissection. Luminal irregularity in the abdominal aorta without aneurysm. Prior aortoiliac bypass from the distal aorta to the external iliac arteries. Small subpleural nodule in the right upper lobe, 3-4 mm. No follow-up needed if patient is low-risk. Non-contrast chest CT can be considered in 12 months if patient is high-risk. This recommendation follows the consensus statement: Guidelines for Management of Incidental Pulmonary Nodules Detected on CT Images: From the Fleischner Society 2017; Radiology 2017; 284:228-243. Large hiatal hernia. Sigmoid diverticulosis.  No active diverticulitis. Prostate enlargement. Electronically Signed   By: Rolm Baptise M.D.   On: 08/08/2018 08:25    Procedures Procedures (including critical care time)  Medications  Ordered in ED Medications  ketorolac (TORADOL) injection 30 mg (has no administration in time range)  HYDROcodone-acetaminophen (NORCO/VICODIN) 5-325 MG per tablet 2 tablet (has no administration in time range)     Initial Impression / Assessment and Plan / ED Course  I have reviewed the triage vital signs and the nursing notes.  Pertinent labs & imaging results that were available during my care of the patient were reviewed by me and considered in my medical decision making (see chart for details).  Patient with history of chronic pain in his upper back.  This has been ongoing for many months, however has worsened over the  past several days to the point he is having difficulty ambulating and sitting up in bed.  He was seen hours before presentation at [facility] and underwent extensive work-up, for which no cause was found.  There were some abnormalities with the flow in his aorta, however this was discussed with thoracic surgery who did not feel this was the cause of his pain.  According to the patient's son at bedside, this patient has become impossible to handle at home.  The patient lives by himself with his wife and the son does not feel as though the patient can safely return there due to his acute on chronic decline.  I see no medical reason for his worsening and no reason for hospital admission.  The patient will remain in the emergency department until morning at which time he will be evaluated by case management and social services to discuss either home health care or possible placement in an extended care facility.  Final Clinical Impressions(s) / ED Diagnoses   Final diagnoses:  None    ED Discharge Orders    None       Veryl Speak, MD 08/09/18 [phone]

## 2018-08-09 NOTE — ED Notes (Signed)
ED TO INPATIENT HANDOFF REPORT  Name/Age/Gender Troy Ray 84 y.o. male  Code Status Code Status History    Date Active Date Inactive Code Status Order ID Comments User Context   08/20/2014 1552 08/22/2014 1541 Full Code [id number]  Elgergawy, Silver Huguenin, MD Inpatient      Home/SNF/Other Home  Chief Complaint Back Pain  Level of Care/Admitting Diagnosis ED Disposition    ED Disposition Condition [facility] Area: [facility] [[id number]]  Level of Care: Med-Surg [16]  Diagnosis: Back pain [998338]  Admitting Physician: Hosie Poisson [[id number]]  Attending Physician: Hosie Poisson [[id number]]  PT Class (Do Not Modify): Observation [104]  PT Acc Code (Do Not Modify): Observation [10022]       Medical History Past Medical History:  Diagnosis Date  . Adenomatous colon polyp   . BPH (benign prostatic hyperplasia) 07/14/2013  . CAD (coronary artery disease)   . Carotid stenosis, bilateral    right 60-79% stenosed.  Left 40-59%  . Chronic kidney disease    chronic, stage II  . CRI (chronic renal insufficiency)   . Diverticulosis   . Dysphagia   . Esophageal stricture    Dr [name]  . Glaucoma   . Headache(784.0) 12/01/2013  . Hyperlipidemia   . Hypertension   . Insomnia 05/12/2015  . Low back pain syndrome 04/03/2014  . Medicare annual wellness visit, subsequent 02/04/2015  . Memory loss   . Postural lightheadedness   . PVD (peripheral vascular disease) ([place])   . Renal insufficiency   . Sensorineural hearing loss, bilateral   . Unspecified constipation 12/29/2012    Allergies No Known Allergies  IV Location/Drains/Wounds Patient Lines/Drains/Airways Status   Active Line/Drains/Airways    Name:   Placement date:   Placement time:   Site:   Days:   Urethral Catheter Rebekah [place] EMT  Straight-tip 14 Fr.   08/08/18    -    Straight-tip   1          Labs/Imaging Results for orders placed or performed during the hospital encounter of 08/08/18  (from the past 48 hour(s))  CBC with Differential/Platelet     Status: Abnormal   Collection Time: 08/08/18  5:04 AM  Result Value Ref Range   WBC 14.7 (H) 4.0 - 10.5 K/uL   RBC 4.53 4.22 - 5.81 MIL/uL   Hemoglobin 13.0 13.0 - 17.0 g/dL   HCT 40.0 39.0 - 52.0 %   MCV 88.3 78.0 - 100.0 fL   MCH 28.7 26.0 - 34.0 pg   MCHC 32.5 30.0 - 36.0 g/dL   RDW 15.5 11.5 - 15.5 %   Platelets 218 150 - 400 K/uL   Neutrophils Relative % 69 %   Neutro Abs 10.1 (H) 1.7 - 7.7 K/uL   Lymphocytes Relative 19 %   Lymphs Abs 2.8 0.7 - 4.0 K/uL   Monocytes Relative 9 %   Monocytes Absolute 1.4 (H) 0.1 - 1.0 K/uL   Eosinophils Relative 3 %   Eosinophils Absolute 0.5 0.0 - 0.7 K/uL   Basophils Relative 0 %   Basophils Absolute 0.0 0.0 - 0.1 K/uL    Comment: Performed at [facility], [address] [place], [place] [address]  Comprehensive metabolic panel     Status: Abnormal   Collection Time: 08/08/18  5:04 AM  Result Value Ref Range   Sodium 136 135 - 145 mmol/L   Potassium 3.9 3.5 - 5.1 mmol/L   Chloride  104 98 - 111 mmol/L   CO2 24 22 - 32 mmol/L   Glucose, Bld 98 70 - 99 mg/dL   BUN 24 (H) 8 - 23 mg/dL   Creatinine, Ser 1.29 (H) 0.61 - 1.24 mg/dL   Calcium 8.8 (L) 8.9 - 10.3 mg/dL   Total Protein 6.3 (L) 6.5 - 8.1 g/dL   Albumin 3.2 (L) 3.5 - 5.0 g/dL   AST 13 (L) 15 - 41 U/L   ALT 12 0 - 44 U/L   Alkaline Phosphatase 49 38 - 126 U/L   Total Bilirubin 0.6 0.3 - 1.2 mg/dL   GFR calc non Af Amer 48 (L) >60 mL/min   GFR calc Af Amer 55 (L) >60 mL/min    Comment: (NOTE) The eGFR has been calculated using the CKD EPI equation. This calculation has not been validated in all clinical situations. eGFR's persistently <60 mL/min signify possible Chronic Kidney Disease.    Anion gap 8 5 - 15    Comment: Performed at [facility], [address] [place], [place] [address]  Troponin I     Status: Abnormal   Collection Time: 08/08/18  5:04 AM  Result Value Ref Range    Troponin I 0.03 (HH) <0.03 ng/mL    Comment: CRITICAL RESULT CALLED TO, READ BACK BY AND VERIFIED WITH: LEBRON,Y,RN @ 3875 08/08/18 BY GWYN,P Performed at [facility], [address] [place], [place] [address]   Urinalysis, Routine w reflex microscopic     Status: None   Collection Time: 08/08/18  5:19 AM  Result Value Ref Range   Color, Urine YELLOW YELLOW   APPearance CLEAR CLEAR   Specific Gravity, Urine 1.010 1.005 - 1.030   pH 7.0 5.0 - 8.0   Glucose, UA NEGATIVE NEGATIVE mg/dL   Hgb urine dipstick NEGATIVE NEGATIVE   Bilirubin Urine NEGATIVE NEGATIVE   Ketones, ur NEGATIVE NEGATIVE mg/dL   Protein, ur NEGATIVE NEGATIVE mg/dL   Nitrite NEGATIVE NEGATIVE   Leukocytes, UA NEGATIVE NEGATIVE    Comment: Microscopic not done on urines with negative protein, blood, leukocytes, nitrite, or glucose < 500 mg/dL. Performed at [facility], [address] [place], [place] [address]   Troponin I     Status: Abnormal   Collection Time: 08/08/18  7:10 AM  Result Value Ref Range   Troponin I 0.03 (HH) <0.03 ng/mL    Comment: CRITICAL RESULT CALLED TO, READ BACK BY AND VERIFIED WITH: CALLED TO A.HARTLEY RN AT [phone] ON [id number] BY [facility] Performed at [facility], [address], [place], [place] [address]    Dg Chest 2 View  Result Date: 08/08/2018 CLINICAL DATA:  Fall this morning. EXAM: CHEST - 2 VIEW COMPARISON:  Chest radiograph 08/20/2014 FINDINGS: Post median sternotomy. Unchanged heart size and mediastinal contours. Retrocardiac hiatal hernia. There is aortic atherosclerosis. Left lung base atelectasis or scarring. No confluent airspace disease. No pulmonary edema. No large pleural effusion or pneumothorax. No visualized rib fracture. Kyphoplasty within mid upper thoracic vertebra. IMPRESSION: 1. No acute or traumatic findings. 2. Hiatal hernia. Electronically Signed   By: Keith Rake M.D.   On: 08/08/2018 05:45   Dg Thoracic Spine 2 View  Result  Date: 08/08/2018 CLINICAL DATA:  84 y/o M; chronic back pain and bilateral leg weakness for 2 weeks. EXAM: THORACIC SPINE 2 VIEWS COMPARISON:  08/08/2018 chest radiograph. FINDINGS: Stable T5-6 chronic compression deformities post kyphoplasty. No acute fracture or malalignment.  Aortic calcific atherosclerosis. Normal thoracic kyphosis without listhesis. Discogenic degenerative changes with small endplate marginal osteophytes. IMPRESSION: Stable T5-6 chronic compression deformities post kyphoplasty. No acute osseous abnormality identified. Electronically Signed   By: Kristine Garbe M.D.   On: 08/08/2018 06:12   Dg Lumbar Spine Complete  Result Date: 08/08/2018 CLINICAL DATA:  Fall.  Chronic back pain.  Bilateral leg weakness. EXAM: LUMBAR SPINE - COMPLETE 4+ VIEW COMPARISON:  Lumbar spine MRI 05/04/2018 FINDINGS: There are 6 non-rib-bearing lumbar vertebra. The alignment is maintained. Vertebral body heights are normal. There is no listhesis. The posterior elements are intact. No fracture. Endplate spurring at multiple levels with minor disc space narrowing L2-L3 and L1-L2. Facet arthropathy in the lower lumbar spine. Sacroiliac joints are congruent. IMPRESSION: Degenerative change in the lumbar spine without acute fracture. Electronically Signed   By: Keith Rake M.D.   On: 08/08/2018 06:13   Ct Angio Chest/abd/pel For Dissection W And/or Wo Contrast  Result Date: 08/08/2018 CLINICAL DATA:  Chest pain, back pain, leg weakness. EXAM: CT ANGIOGRAPHY CHEST, ABDOMEN AND PELVIS TECHNIQUE: Multidetector CT imaging through the chest, abdomen and pelvis was performed using the standard protocol during bolus administration of intravenous contrast. Multiplanar reconstructed images and MIPs were obtained and reviewed to evaluate the vascular anatomy. CONTRAST:  121m ISOVUE-370 IOPAMIDOL (ISOVUE-370) INJECTION 76% COMPARISON:  CT abdomen and pelvis 09/14/2017 FINDINGS: CTA CHEST FINDINGS Cardiovascular:  There is marked luminal narrowing within the proximal descending thoracic aorta. Slight dilatation of the descending thoracic aorta in this area. This measures maximally 3.4 cm in the mid descending thoracic aorta. The marked luminal narrowing could be due to thrombosed chronic dissection or severe irregular plaque formation. Heart is normal size. Mediastinum/Nodes: No mediastinal, hilar, or axillary adenopathy. Large hiatal hernia. Lungs/Pleura: Small subpleural nodule in the right upper lobe measures 3-4 mm on image 25. Compressive atelectasis in the left lower lobe adjacent to the hiatal hernia. No effusions or confluent airspace opacities. Musculoskeletal: Chest wall soft tissues are unremarkable. No acute bony abnormality. Compression deformities at T5 and T6 with prior vertebroplasty changes. Review of the MIP images confirms the above findings. CTA ABDOMEN AND PELVIS FINDINGS VASCULAR Aorta: Markedly irregular plaque formation throughout the abdominal aorta. No aneurysm. Maximum diameter 2.7 cm distally. Changes of remote bypass from the distal aorta to the external iliac arteries. Celiac: Widely patent SMA: Irregular plaque formation and luminal narrowing noted in the proximal superior mesenteric artery. Patent. Luminal narrowing is likely greater than 50%. Renals: Moderate narrowing of the left renal artery, likely 50% or greater. No significant stenosis on the right. IMA: Occluded at its origin with reconstitution. Inflow: Aorto bi-iliac bypass widely patent. Native common iliac arteries occluded. Veins: Grossly unremarkable. Review of the MIP images confirms the above findings. NON-VASCULAR Hepatobiliary: No focal hepatic abnormality. Gallbladder unremarkable. Pancreas: No focal abnormality or ductal dilatation. Spleen: No focal abnormality.  Normal size. Adrenals/Urinary Tract: No adrenal mass. Small cyst in the midpole of the left kidney. No hydronephrosis. Urinary bladder grossly unremarkable.  Stomach/Bowel: Sigmoid diverticulosis. No active diverticulitis. No evidence of bowel obstruction. Lymphatic: No adenopathy Reproductive: Prostate enlargement with central calcifications. Other: No free fluid or free air. Musculoskeletal: Degenerative changes in the lumbar spine. No acute bony abnormality. Review of the MIP images confirms the above findings. IMPRESSION: Marked luminal irregularity and narrowing within the proximal and mid descending thoracic aorta. Aneurysm at this level mildly aneurysmal at 3.4 cm. The luminal narrowing could be related to markedly irregular plaque formation or chronic  thrombosed dissection. Luminal irregularity in the abdominal aorta without aneurysm. Prior aortoiliac bypass from the distal aorta to the external iliac arteries. Small subpleural nodule in the right upper lobe, 3-4 mm. No follow-up needed if patient is low-risk. Non-contrast chest CT can be considered in 12 months if patient is high-risk. This recommendation follows the consensus statement: Guidelines for Management of Incidental Pulmonary Nodules Detected on CT Images: From the Fleischner Society 2017; Radiology 2017; 284:228-243. Large hiatal hernia. Sigmoid diverticulosis.  No active diverticulitis. Prostate enlargement. Electronically Signed   By: Rolm Baptise M.D.   On: 08/08/2018 08:25    Pending Labs Unresulted Labs (From admission, onward)   None      Vitals/Pain Today's Vitals   08/09/18 0130 08/09/18 0200 08/09/18 0245 08/09/18 0659  BP: (!) 145/58 (!) 148/61 (!) 148/61 (!) 154/53  Pulse: (!) 57 (!) 58 (!) 55 (!) 52  Resp: '15 17 18 16  '$ Temp:      TempSrc:      SpO2: 97% 98% 96% 99%  Weight:      Height:      PainSc:        Isolation Precautions No active isolations  Medications Medications  ketorolac (TORADOL) injection 30 mg (30 mg Intramuscular Given 08/09/18 0211)  HYDROcodone-acetaminophen (NORCO/VICODIN) 5-325 MG per tablet 2 tablet (2 tablets Oral Given 08/09/18 0211)     Mobilit

## 2018-08-09 NOTE — Evaluation (Signed)
Physical Therapy Evaluation Patient Details Name: Troy Ray MRN: [id number] DOB: 1928/07/31 Today's Date: 08/09/2018   History of Present Illness  84 yo male admitted with severe back pain. Hx of comp fx-s/p thoracic KP, CAD, CKD, hearing loss.   Clinical Impression  On eval, pt required Min-Mod assist for mobility. He was able to walk ~20 feet with a RW. Pain was mild-mod with activity. LOB multiple times during session requiring assist from therapist to prevent fall. Pt presents with general weakness, decreased activity tolerance, and impaired gait and balance. He is at mod-high risk for falls when mobilizing. Recommend ST rehab at SNF.     Follow Up Recommendations SNF    Equipment Recommendations  None recommended by PT    Recommendations for Other Services       Precautions / Restrictions Precautions Precautions: Fall Restrictions Weight Bearing Restrictions: No      Mobility  Bed Mobility Overal bed mobility: Needs Assistance Bed Mobility: Supine to Sit     Supine to sit: Min guard;HOB elevated     General bed mobility comments: Increased time. Close guard for safety. Cues for technique.   Transfers Overall transfer level: Needs assistance Equipment used: Rolling walker (2 wheeled) Transfers: Sit to/from Stand Sit to Stand: Mod assist;From elevated surface;Min assist         General transfer comment: Min-Mod assist to rise, stabilize. LOB multiple times during sit to stand transitions requiring therapist asssistance to prevent fall. VCs safety, hand placement  Ambulation/Gait Ambulation/Gait assistance: Min assist Gait Distance (Feet): 20 Feet(15'x2) Assistive device: Rolling walker (2 wheeled) Gait Pattern/deviations: Step-through pattern;Decreased stride length;Trunk flexed     General Gait Details: Assist to stabilize pt and maneuver safely with RW. Cues for posture, distance from RW. Pt fatigues easily. Followed with recliner for safety.    Stairs            Wheelchair Mobility    Modified Rankin (Stroke Patients Only)       Balance Overall balance assessment: Needs assistance;History of Falls         Standing balance support: Bilateral upper extremity supported Standing balance-Leahy Scale: Poor                               Pertinent Vitals/Pain Pain Assessment: Faces Faces Pain Scale: Hurts little more Pain Location: back Pain Descriptors / Indicators: Aching;Sore;Discomfort;Moaning Pain Intervention(s): Limited activity within patient's tolerance;Monitored during session;Repositioned    Home Living Family/patient expects to be discharged to:: Skilled nursing facility Living Arrangements: Spouse/significant other Available Help at Discharge: Family Type of Home: House         Home Equipment: Gilford Rile - 2 wheels      Prior Function Level of Independence: Independent with assistive device(s)               Hand Dominance        Extremity/Trunk Assessment   Upper Extremity Assessment Upper Extremity Assessment: Defer to OT evaluation    Lower Extremity Assessment Lower Extremity Assessment: Generalized weakness    Cervical / Trunk Assessment Cervical / Trunk Assessment: Kyphotic  Communication   Communication: HOH  Cognition Arousal/Alertness: Awake/alert Behavior During Therapy: WFL for tasks assessed/performed Overall Cognitive Status: Within Functional Limits for tasks assessed  General Comments      Exercises     Assessment/Plan    PT Assessment Patient needs continued PT services  PT Problem List Decreased strength;Decreased balance;Decreased mobility;Decreased activity tolerance;Pain;Decreased knowledge of use of DME       PT Treatment Interventions DME instruction;Gait training;Functional mobility training;Therapeutic activities;Balance training;Patient/family education;Stair  training;Therapeutic exercise    PT Goals (Current goals can be found in the Care Plan section)  Acute Rehab PT Goals Patient Stated Goal: pain control. to get stronger. PT Goal Formulation: With patient/family Time For Goal Achievement: 08/23/18 Potential to Achieve Goals: Good    Frequency Min 3X/week   Barriers to discharge        Co-evaluation               AM-PAC PT "6 Clicks" Daily Activity  Outcome Measure Difficulty turning over in bed (including adjusting bedclothes, sheets and blankets)?: A Lot Difficulty moving from lying on back to sitting on the side of the bed? : A Lot Difficulty sitting down on and standing up from a chair with arms (e.g., wheelchair, bedside commode, etc,.)?: Unable Help needed moving to and from a bed to chair (including a wheelchair)?: A Little Help needed walking in hospital room?: A Little Help needed climbing 3-5 steps with a railing? : A Lot 6 Click Score: 13    End of Session Equipment Utilized During Treatment: Gait belt Activity Tolerance: Patient limited by fatigue;Patient limited by pain Patient left: in chair;with chair alarm set;with family/visitor present;with call bell/phone within reach   PT Visit Diagnosis: Muscle weakness (generalized) (M62.81);Difficulty in walking, not elsewhere classified (R26.2);Pain;Unsteadiness on feet (R26.81);History of falling (Z91.81) Pain - part of body: (back)    Time: 2500-3704 PT Time Calculation (min) (ACUTE ONLY): 27 min   Charges:   PT Evaluation $PT Eval Moderate Complexity: [place], PT Acute Rehabilitation Services Pager: [phone] Office: [phone]

## 2018-08-09 NOTE — H&P (Signed)
History and Physical    Troy Ray:[id number] DOB: [birth year] DOA: 08/08/2018  PCP: [name], MD  Patient coming from: HOME  I have personally briefly reviewed patient's old medical records in [place]  Chief Complaint: back since one month..   HPI: Troy Ray is a 84 y.o. male with medical history significant of CAD, stage 3 CKD, hypertension, prior T4 , T5 vertebroplasty, BPH, diverticulosis, hyperlipidemia, comes in for worsening back pain since one month. Pt was seen at [facility] yesterday and underwent extensive work up with CT angio of the chest , abdomen and pelvis, but no acute changes in the vertebra or new fractures evident. Patient is hard of hearing and not a great historian. He is alert and oriented to place ,person and time. No family at bedside, history is not detailed.  He reports severe pain on moving and is almost pain free at rest.  Over the last few days, he reports the back pain progressed to the point, that he is unable to walk or prop himself up from the bed. He reports the pain in the back is mostly in the upper back radiating to the front on both sides. No tingling or numbness of the upper or lower extremities. He denies any dizziness or weakness. He reports he is unable to walk  due to the back pain. He also reports episodes of urinary incontinence followed by periods of urinary rentention.    He denies fevers or chills. No nausea, vomiting, diarrhea  or abdominal pain. No chest pain , sob or cough or palpitations, or syncope.   ED Course: he underwent extensive work up with a CT angio of the chest abd and pelvis revealing Aorta: Markedly irregular plaque formation throughout the abdominal aorta. No aneurysm. Maximum diameter 2.7 cm distally. Changes of remote bypass from the distal aorta to the external iliac arteries.  As per conversation with DR Tamera Punt, EDP consulted cardiothoracic surgeon regarding the abnormal CT results and CVTS  suggested the CT findings are not acute and recommended outpatient follow up with them.   Lab work significant for creatinine of 1.29 and BUN of 24.  Troponin at 0.03 and wbc count of 14.7.  UA is wnl.  CXR does not show any acute findings.  He was given one dose of toradol and one dose of vicodin and was referred to medical service for admission for severe back pain.  Home health services were offered to the patient while being discharged from [place].   Review of Systems:  "All others reviewed and are negative,"  Past Medical History:  Diagnosis Date  . Adenomatous colon polyp   . BPH (benign prostatic hyperplasia) 07/14/2013  . CAD (coronary artery disease)   . Carotid stenosis, bilateral    right 60-79% stenosed.  Left 40-59%  . Chronic kidney disease    chronic, stage II  . CRI (chronic renal insufficiency)   . Diverticulosis   . Dysphagia   . Esophageal stricture    Dr [name]  . Glaucoma   . Headache(784.0) 12/01/2013  . Hyperlipidemia   . Hypertension   . Insomnia 05/12/2015  . Low back pain syndrome 04/03/2014  . Medicare annual wellness visit, subsequent 02/04/2015  . Memory loss   . Postural lightheadedness   . PVD (peripheral vascular disease) ([place])   . Renal insufficiency   . Sensorineural hearing loss, bilateral   . Unspecified constipation 12/29/2012    Past Surgical History:  Procedure Laterality Date  . ABDOMINAL AORTIC ANEURYSM REPAIR  1996   Dr [name]  . CORONARY ARTERY BYPASS GRAFT    . EYE SURGERY     cataracts b/l and laser  . HERNIA REPAIR  03/23/05   left inguinal Lichtenstein repair, with mesh.  [name]  . IR [facility] THORACIC WITH BONE BIOPSY  11/08/2017  . IR RADIOLOGIST EVAL & MGMT  10/22/2017  . IR VERTEBROPLASTY CERV/THOR BX INC UNI/BIL INC/INJECT/IMAGING  06/25/2018  . RENAL ARTERY STENT  1996   Dr [name].  Bilateral stenting     reports that he has quit smoking. He has never used smokeless tobacco. He reports that he drinks  about 1.0 standard drinks of alcohol per week. He reports that he does not use drugs.  No Known Allergies  Family History  Problem Relation Age of Onset  . Heart disease Father        CAD  . Coronary artery disease Father   . Stroke Father   . Hearing loss Father   . Hearing loss Paternal Grandfather   . Heart disease Son   . Heart disease Brother        MI  . Cancer Neg Hx   . Diabetes Neg Hx     Acceptable: Family history reviewed and not pertinent   Prior to Admission medications   Medication Sig Start Date End Date Taking? Authorizing Provider  amLODipine (NORVASC) 5 MG tablet TAKE 1 TABLET TWICE DAILY 07/25/18  Yes Ann Held, DO  aspirin EC 81 MG EC tablet Take 81 mg by mouth at bedtime.  02/21/11  Yes [name], [name], DO  atorvastatin (LIPITOR) 40 MG tablet TAKE 1 TABLET EVERY DAY 07/24/18  Yes [name], MD  finasteride (PROSCAR) 5 MG tablet TAKE 1 TABLET EVERY DAY 07/25/18  Yes Roma Schanz R, DO  latanoprost (XALATAN) 0.005 % ophthalmic solution Place 1 drop into both eyes at bedtime. 08/13/17  Yes [provider]  losartan (COZAAR) 50 MG tablet TAKE 1 TABLET TWICE DAILY 07/16/18  Yes [name], MD  metoprolol succinate (TOPROL-XL) 50 MG 24 hr tablet Take 1 tablet (50 mg total) by mouth every morning. Take with or immediately following a meal. 07/23/18  Yes [name], MD  pantoprazole (PROTONIX) 40 MG tablet Take 1 tablet (40 mg total) by mouth every morning. 07/23/18  Yes [name], MD  predniSONE (DELTASONE) 10 MG tablet Take 4 tabs ;po qd x 3 days then TAKE 3 TABLETS PO QD FOR 3 DAYS THEN TAKE 2 TABLETS PO QD FOR 3 DAYS THEN TAKE 1 TABLET PO QD 07/23/18  Yes [name], MD  timolol (TIMOPTIC) 0.5 % ophthalmic solution Place 1 drop into both eyes daily.  02/25/15  Yes [provider]  tiZANidine (ZANAFLEX) 2 MG tablet Take 1 tablet (2 mg total) by mouth every 8 (eight) hours as needed for muscle spasms. 07/23/18  Yes  [name], MD    Physical Exam: Vitals:   08/09/18 0200 08/09/18 0245 08/09/18 0659 08/09/18 0817  BP: (!) 148/61 (!) 148/61 (!) 154/53 (!) 185/62  Pulse: (!) 58 (!) 55 (!) 52 (!) 56  Resp: 17 18 16 19   Temp:      TempSrc:      SpO2: 98% 96% 99% 98%  Weight:      Height:        Constitutional: NAD, calm, comfortable Vitals:   08/09/18 0200 08/09/18 0245  08/09/18 0659 08/09/18 0817  BP: (!) 148/61 (!) 148/61 (!) 154/53 (!) 185/62  Pulse: (!) 58 (!) 55 (!) 52 (!) 56  Resp: 17 18 16 19   Temp:      TempSrc:      SpO2: 98% 96% 99% 98%  Weight:      Height:       Eyes: PERRL, lids and conjunctivae normal ENMT: Mucous membranes are moist. Posterior pharynx clear of any exudate or lesions.Normal dentition.  Neck: normal, supple, no masses, no thyromegaly Respiratory: clear to auscultation bilaterally, no wheezing, no crackles. Normal respiratory effort. No accessory muscle use.  Cardiovascular: Regular rate and rhythm, no murmurs / rubs / gallops.  Abdomen: no tenderness, no masses palpated. No hepatosplenomegaly. Bowel sounds positive.  Musculoskeletal: no clubbing / cyanosis. No joint deformity upper and lower extremities. .  Skin: no rashes, lesions, ulcers. No induration Neurologic: CN 2-12 grossly intact. Sensation intact, strength 4/5 for the lower extremities and 5/5 of the upper extremities.  Did not get him out of bed.  Psychiatric:. Alert and oriented x 3. Normal mood. VERY HARD OF HEARING.    Labs on Admission: I have personally reviewed following labs and imaging studies  CBC: Recent Labs  Lab 08/08/18 0504  WBC 14.7*  NEUTROABS 10.1*  HGB 13.0  HCT 40.0  MCV 88.3  PLT 416   Basic Metabolic Panel: Recent Labs  Lab 08/08/18 0504  NA 136  K 3.9  CL 104  CO2 24  GLUCOSE 98  BUN 24*  CREATININE 1.29*  CALCIUM 8.8*   GFR: Estimated Creatinine Clearance: 36.3 mL/min (A) (by C-G formula based on SCr of 1.29 mg/dL (H)). Liver Function  Tests: Recent Labs  Lab 08/08/18 0504  AST 13*  ALT 12  ALKPHOS 49  BILITOT 0.6  PROT 6.3*  ALBUMIN 3.2*   No results for input(s): LIPASE, AMYLASE in the last 168 hours. No results for input(s): AMMONIA in the last 168 hours. Coagulation Profile: No results for input(s): INR, PROTIME in the last 168 hours. Cardiac Enzymes: Recent Labs  Lab 08/08/18 0504 08/08/18 0710  TROPONINI 0.03* 0.03*   BNP (last 3 results) No results for input(s): PROBNP in the last 8760 hours. HbA1C: No results for input(s): HGBA1C in the last 72 hours. CBG: No results for input(s): GLUCAP in the last 168 hours. Lipid Profile: No results for input(s): CHOL, HDL, LDLCALC, TRIG, CHOLHDL, LDLDIRECT in the last 72 hours. Thyroid Function Tests: No results for input(s): TSH, T4TOTAL, FREET4, T3FREE, THYROIDAB in the last 72 hours. Anemia Panel: No results for input(s): VITAMINB12, FOLATE, FERRITIN, TIBC, IRON, RETICCTPCT in the last 72 hours. Urine analysis:    Component Value Date/Time   COLORURINE YELLOW 08/08/2018 0519   APPEARANCEUR CLEAR 08/08/2018 0519   LABSPEC 1.010 08/08/2018 0519   PHURINE 7.0 08/08/2018 0519   GLUCOSEU NEGATIVE 08/08/2018 0519   HGBUR NEGATIVE 08/08/2018 0519   HGBUR small 07/04/2010 1559   [place] 08/08/2018 0519   KETONESUR NEGATIVE 08/08/2018 0519   PROTEINUR NEGATIVE 08/08/2018 0519   UROBILINOGEN 0.2 08/20/2014 1056   NITRITE NEGATIVE 08/08/2018 0519   LEUKOCYTESUR NEGATIVE 08/08/2018 0519    Radiological Exams on Admission: Dg Chest 2 View  Result Date: 08/08/2018 CLINICAL DATA:  Fall this morning. EXAM: CHEST - 2 VIEW COMPARISON:  Chest radiograph 08/20/2014 FINDINGS: Post median sternotomy. Unchanged heart size and mediastinal contours. Retrocardiac hiatal hernia. There is aortic atherosclerosis. Left lung base atelectasis or scarring. No confluent airspace disease. No pulmonary  edema. No large pleural effusion or pneumothorax. No visualized  rib fracture. Kyphoplasty within mid upper thoracic vertebra. IMPRESSION: 1. No acute or traumatic findings. 2. Hiatal hernia. Electronically Signed   By: Keith Rake M.D.   On: 08/08/2018 05:45   Dg Thoracic Spine 2 View  Result Date: 08/08/2018 CLINICAL DATA:  84 y/o M; chronic back pain and bilateral leg weakness for 2 weeks. EXAM: THORACIC SPINE 2 VIEWS COMPARISON:  08/08/2018 chest radiograph. FINDINGS: Stable T5-6 chronic compression deformities post kyphoplasty. No acute fracture or malalignment. Aortic calcific atherosclerosis. Normal thoracic kyphosis without listhesis. Discogenic degenerative changes with small endplate marginal osteophytes. IMPRESSION: Stable T5-6 chronic compression deformities post kyphoplasty. No acute osseous abnormality identified. Electronically Signed   By: Kristine Garbe M.D.   On: 08/08/2018 06:12   Dg Lumbar Spine Complete  Result Date: 08/08/2018 CLINICAL DATA:  Fall.  Chronic back pain.  Bilateral leg weakness. EXAM: LUMBAR SPINE - COMPLETE 4+ VIEW COMPARISON:  Lumbar spine MRI 05/04/2018 FINDINGS: There are 6 non-rib-bearing lumbar vertebra. The alignment is maintained. Vertebral body heights are normal. There is no listhesis. The posterior elements are intact. No fracture. Endplate spurring at multiple levels with minor disc space narrowing L2-L3 and L1-L2. Facet arthropathy in the lower lumbar spine. Sacroiliac joints are congruent. IMPRESSION: Degenerative change in the lumbar spine without acute fracture. Electronically Signed   By: Keith Rake M.D.   On: 08/08/2018 06:13   Ct Angio Chest/abd/pel For Dissection W And/or Wo Contrast  Result Date: 08/08/2018 CLINICAL DATA:  Chest pain, back pain, leg weakness. EXAM: CT ANGIOGRAPHY CHEST, ABDOMEN AND PELVIS TECHNIQUE: Multidetector CT imaging through the chest, abdomen and pelvis was performed using the standard protocol during bolus administration of intravenous contrast. Multiplanar  reconstructed images and MIPs were obtained and reviewed to evaluate the vascular anatomy. CONTRAST:  166mL ISOVUE-370 IOPAMIDOL (ISOVUE-370) INJECTION 76% COMPARISON:  CT abdomen and pelvis 09/14/2017 FINDINGS: CTA CHEST FINDINGS Cardiovascular: There is marked luminal narrowing within the proximal descending thoracic aorta. Slight dilatation of the descending thoracic aorta in this area. This measures maximally 3.4 cm in the mid descending thoracic aorta. The marked luminal narrowing could be due to thrombosed chronic dissection or severe irregular plaque formation. Heart is normal size. Mediastinum/Nodes: No mediastinal, hilar, or axillary adenopathy. Large hiatal hernia. Lungs/Pleura: Small subpleural nodule in the right upper lobe measures 3-4 mm on image 25. Compressive atelectasis in the left lower lobe adjacent to the hiatal hernia. No effusions or confluent airspace opacities. Musculoskeletal: Chest wall soft tissues are unremarkable. No acute bony abnormality. Compression deformities at T5 and T6 with prior vertebroplasty changes. Review of the MIP images confirms the above findings. CTA ABDOMEN AND PELVIS FINDINGS VASCULAR Aorta: Markedly irregular plaque formation throughout the abdominal aorta. No aneurysm. Maximum diameter 2.7 cm distally. Changes of remote bypass from the distal aorta to the external iliac arteries. Celiac: Widely patent SMA: Irregular plaque formation and luminal narrowing noted in the proximal superior mesenteric artery. Patent. Luminal narrowing is likely greater than 50%. Renals: Moderate narrowing of the left renal artery, likely 50% or greater. No significant stenosis on the right. IMA: Occluded at its origin with reconstitution. Inflow: Aorto bi-iliac bypass widely patent. Native common iliac arteries occluded. Veins: Grossly unremarkable. Review of the MIP images confirms the above findings. NON-VASCULAR Hepatobiliary: No focal hepatic abnormality. Gallbladder unremarkable.  Pancreas: No focal abnormality or ductal dilatation. Spleen: No focal abnormality.  Normal size. Adrenals/Urinary Tract: No adrenal mass. Small cyst in the midpole of  the left kidney. No hydronephrosis. Urinary bladder grossly unremarkable. Stomach/Bowel: Sigmoid diverticulosis. No active diverticulitis. No evidence of bowel obstruction. Lymphatic: No adenopathy Reproductive: Prostate enlargement with central calcifications. Other: No free fluid or free air. Musculoskeletal: Degenerative changes in the lumbar spine. No acute bony abnormality. Review of the MIP images confirms the above findings. IMPRESSION: Marked luminal irregularity and narrowing within the proximal and mid descending thoracic aorta. Aneurysm at this level mildly aneurysmal at 3.4 cm. The luminal narrowing could be related to markedly irregular plaque formation or chronic thrombosed dissection. Luminal irregularity in the abdominal aorta without aneurysm. Prior aortoiliac bypass from the distal aorta to the external iliac arteries. Small subpleural nodule in the right upper lobe, 3-4 mm. No follow-up needed if patient is low-risk. Non-contrast chest CT can be considered in 12 months if patient is high-risk. This recommendation follows the consensus statement: Guidelines for Management of Incidental Pulmonary Nodules Detected on CT Images: From the Fleischner Society 2017; Radiology 2017; 284:228-243. Large hiatal hernia. Sigmoid diverticulosis.  No active diverticulitis. Prostate enlargement. Electronically Signed   By: Rolm Baptise M.D.   On: 08/08/2018 08:25    EKG: Independently reviewed. Sinus rhythm with borderline t wave abnormalities.   Assessment/Plan Active Problems:   Back pain   Persistent severe back pain:  - suspect the pain coming from his old fracture site from T4 and T5.  - s/p vertebroplasty in the past.  - pain control with tramadol and IV toradol. If pain is not controlled, get MRI of the thoracic spine.  - PT/OT  Evaluation.     Hypertension: Not well controlled.  Resume home meds and re check BP.    Hyperlipidemia:  Resume lipitor.    CAD:  Resume aspirin.  Follow up the abnormal CT angio results with cardiology/ CVTS as outpatient.    Urinary retention: probably from BPH, restart home meds.  Foley placed. UA is wnl, no signs of infection.    Leukocytosis:  Probably reactive , no source of infection found.    Stage 3 CKD:  Creatinine appears to be at baseline.      DVT prophylaxis: Lovenox.  Code Status: full code.  Family Communication: none at bedside.  Disposition Plan: pending eval by PT and pain control  Consults called: CVTS called by EDP  Admission status: obs/ med surg.    Hosie Poisson MD [facility] Pager [phone]  If 7PM-7AM, please contact night-coverage [email or web address] Password [email or web address]  08/09/2018, 8:40 AM

## 2018-08-10 DIAGNOSIS — N401 Enlarged prostate with lower urinary tract symptoms: Secondary | ICD-10-CM

## 2018-08-10 NOTE — NC FL2 (Signed)
[place] LEVEL OF CARE SCREENING TOOL     IDENTIFICATION  Patient Name: Troy Ray Birthdate: 10-30-1928 Sex: male Admission Date (Current Location): 08/08/2018  [facility] and [place] Number:  Herbalist and Address:  [facility],  [place], [place]      Provider Number: [id number]  Attending Physician Name and Address:  Oswald Hillock, MD  Relative Name and Phone Number:  [name], [name] [phone]  or [name], [name] [phone]  [phone] or [name], [name] [phone]     Current Level of Care: Hospital Recommended Level of Care: [place] Prior Approval Number:    Date Approved/Denied:   PASRR Number: [id number]  Discharge Plan: SNF    Current Diagnoses: Patient Active Problem List   Diagnosis Date Noted  . Back pain 08/09/2018  . Vertebral fracture, osteoporotic ([place]) 07/23/2018  . T6 vertebral fracture ([place]) 09/12/2017  . Leukocytosis 09/12/2017  . Sun-damaged skin 06/27/2017  . Gait disturbance 05/08/2017  . Low back pain 05/08/2017  . Anorexia 12/12/2015  . Skin lesion of left arm 05/12/2015  . Gastroesophageal reflux disease without esophagitis 05/12/2015  . Insomnia 05/12/2015  . Preventative health care 02/04/2015  . Glaucoma 01/25/2015  . Left-sided low back pain with left-sided sciatica 04/03/2014  . Apnea 01/25/2014  . Headache 12/01/2013  . Sinusitis 10/13/2013  . BPH (benign prostatic hyperplasia) 07/14/2013  . Constipation 12/29/2012  . LACK OF COORDINATION 11/17/2010  . Hyperglycemia 11/17/2010  . PERSONAL HISTORY OF COLONIC POLYPS 09/21/2009  . Carotid artery disease ([place]) 05/11/2009  . MEMORY LOSS 04/06/2009  . Hyperlipidemia, mixed 12/07/2008  . Essential hypertension 12/07/2008  . Coronary atherosclerosis 12/07/2008  . History of AAA (abdominal aortic aneurysm) repair 12/04/2008  . CHRONIC KIDNEY DISEASE STAGE II (MILD) 12/04/2008  . ESOPHAGEAL  STRICTURE 05/29/2008  . OTHER DYSPHAGIA 05/29/2008    Orientation RESPIRATION BLADDER Height & Weight     Self, Place  Normal Indwelling catheter Weight: 142 lb 13.7 oz (64.8 kg) Height:  5\' 10"  (177.8 cm)  BEHAVIORAL SYMPTOMS/MOOD NEUROLOGICAL BOWEL NUTRITION STATUS      Continent Diet(Regular diet)  AMBULATORY STATUS COMMUNICATION OF NEEDS Skin   Limited Assist Verbally Normal                       Personal Care Assistance Level of Assistance  Bathing, Feeding, Dressing Bathing Assistance: Limited assistance Feeding assistance: Independent Dressing Assistance: Limited assistance     Functional Limitations Info  Sight, Hearing, Speech Sight Info: Adequate Hearing Info: Adequate Speech Info: Adequate    SPECIAL CARE FACTORS FREQUENCY  PT (By licensed PT), OT (By licensed OT)     PT Frequency: minimum 5x a week OT Frequency: minimum 5x a week            Contractures Contractures Info: Not present    Additional Factors Info  Code Status, Allergies Code Status Info: Full Code Allergies Info: NKA           Current Medications (08/10/2018):  This is the current hospital active medication list Current Facility-Administered Medications  Medication Dose Route Frequency Provider Last Rate Last Dose  . 0.9 %  sodium chloride infusion   Intravenous Continuous Hosie Poisson, MD 50 mL/hr at 08/10/18 1021    . amLODipine (NORVASC) tablet 5 mg  5 mg Oral BID Hosie Poisson, MD   5 mg at 08/10/18 1022  . aspirin EC tablet 81 mg  81 mg Oral QHS  Hosie Poisson, MD   81 mg at 08/09/18 2208  . atorvastatin (LIPITOR) tablet 40 mg  40 mg Oral Daily Hosie Poisson, MD   40 mg at 08/10/18 1021  . enoxaparin (LOVENOX) injection 40 mg  40 mg Subcutaneous Q24H Hosie Poisson, MD   40 mg at 08/09/18 1811  . finasteride (PROSCAR) tablet 5 mg  5 mg Oral Daily Hosie Poisson, MD   5 mg at 08/10/18 1021  . ketorolac (TORADOL) 15 MG/ML injection 15 mg  15 mg Intravenous Q8H PRN Hosie Poisson, MD   15 mg at 08/09/18 2234  . latanoprost (XALATAN) 0.005 % ophthalmic solution 1 drop  1 drop Both Eyes QHS Hosie Poisson, MD   1 drop at 08/09/18 2210  . losartan (COZAAR) tablet 50 mg  50 mg Oral BID Hosie Poisson, MD   50 mg at 08/10/18 1021  . metoprolol succinate (TOPROL-XL) 24 hr tablet 50 mg  50 mg Oral q morning - 10a Hosie Poisson, MD   50 mg at 08/10/18 1022  . pantoprazole (PROTONIX) EC tablet 40 mg  40 mg Oral Daily Hosie Poisson, MD   40 mg at 08/10/18 1022  . timolol (TIMOPTIC) 0.5 % ophthalmic solution 1 drop  1 drop Both Eyes Daily Hosie Poisson, MD   1 drop at 08/10/18 1026  . tiZANidine (ZANAFLEX) tablet 2 mg  2 mg Oral Q8H PRN Hosie Poisson, MD   2 mg at 08/09/18 1023  . traMADol (ULTRAM) tablet 100 mg  100 mg Oral Q6H PRN Hosie Poisson, MD   100 mg at 08/09/18 1023     Discharge Medications: Please see discharge summary for a list of discharge medications.  Relevant Imaging Results:  Relevant Lab Results:   Additional Information SSN [id number]  Ross Ludwig, [place]

## 2018-08-10 NOTE — Clinical Social Work Note (Signed)
Clinical Social Work Assessment  Patient Details  Name: Troy Ray MRN: [id number] Date of Birth: [birth year]  Date of referral:  08/10/18               Reason for consult:  Facility Placement                Permission sought to share information with:  Family Supports, Customer service manager Permission granted to share information::  Yes, Verbal Permission Granted  Name::     Zaydn, [place] [phone]  or Orian, [place]ue[place] [phone]  [phone] or Denney, [place] [phone]   Agency::  SNF admissions  Relationship::     Contact Information:     Housing/Transportation Living arrangements for the past 2 months:  Single Family Home Source of Information:  Medical Team Patient Interpreter Needed:  None Criminal Activity/Legal Involvement Pertinent to Current Situation/Hospitalization:  No - Comment as needed Significant Relationships:  Adult Children, Spouse Lives with:  Spouse Do you feel safe going back to the place where you live?  No Need for family participation in patient care:  Yes (Comment)  Care giving concerns:  Patient's family reports he needs snf for rehab before he can return back home.  Social Worker assessment / plan:  Patient is an 84 year old male who is alert and oriented x1.  Patient has dementia, unable to get a hold of patient's wife, left message on voice mail.  CSW completed assessment based on medical review.  Patient lives with his wife, has been to rehab in the past, unknown when he was last a facility.  Patient was pleasantly confused.  Patient's family reported to physician they would like SNF placement, and gave CSW permission via physician to begin bed search.  CSW unable to discuss with patient how insurance will pay for his stay at SNF due to patient's confusion.  CSW began bed search in [place].  Employment status:  Retired Nurse, adult PT Recommendations:  [place]  / Referral to community resources:  [place]  Patient/Family's Response to care:  Patient's family requested to physican a SNF bed. Patient/Family's Understanding of and Emotional Response to Diagnosis, Current Treatment, and Prognosis:  Patient's family reported to physician they hope he can go to a SNF and get some rehab, and they can discuss next options for level of care. Emotional Assessment Appearance:  Appears stated age Attitude/Demeanor/Rapport:    Affect (typically observed):  Appropriate, Calm, Pleasant Orientation:  Oriented to Self Alcohol / Substance use:  Not Applicable Psych involvement (Current and /or in the community):  No (Comment)  Discharge Needs  Concerns to be addressed:  Cognitive Concerns, Care Coordination Readmission within the last 30 days:  No Current discharge risk:  Cognitively Impaired, Lack of support system Barriers to Discharge:  Continued Medical Work up, [employer]   Ross Ludwig, [place] 08/10/2018, 7:11 PM

## 2018-08-10 NOTE — Progress Notes (Signed)
[facility]  PROGRESS NOTE  Troy Ray SJG:[id number] DOB: 1928-04-16 DOA: 08/08/2018 PCP: [name], MD   Brief HPI:    84 y.o. male with medical history significant of CAD, stage 3 CKD, hypertension, prior T4 , T5 vertebroplasty, BPH, diverticulosis, hyperlipidemia, comes in for worsening back pain since one month. Pt was seen at [facility] yesterday and underwent extensive work up with CT angio of the chest , abdomen and pelvis, but no acute changes in the vertebra or new fractures evident.    Subjective   Patient seen and examined, denies pain at this time.   Assessment/Plan:     1. Severe back pain-likely from old fracture at T4 and T5.  Patient had vertebroplasty in the past.  Pain controlled with tramadol, IV Toradol.  PT OT recommends skilled nursing facility placement.  2. Hypertension-blood pressure is fairly well controlled, continue amlodipine, metoprolol, losartan  3. Hyperlipidemia-continue Lipitor  4. Coronary artery disease-continue aspirin  5. Extensive plaque formation in the abdominal aorta-CT angiogram of the abdomen showed extensive plaque formation in the abdominal aorta, maximum diameter 2.7 cm distally.  CVTS was consulted by ED physician.  Plan to follow-up as outpatient  6. Urinary retention-likely from BPH, home medications have been restarted.  Foley catheter placed.  UA is normal     CBG: No results for input(s): GLUCAP in the last 168 hours.  CBC: Recent Labs  Lab 08/08/18 0504  WBC 14.7*  NEUTROABS 10.1*  HGB 13.0  HCT 40.0  MCV 88.3  PLT 654    Basic Metabolic Panel: Recent Labs  Lab 08/08/18 0504  NA 136  K 3.9  CL 104  CO2 24  GLUCOSE 98  BUN 24*  CREATININE 1.29*  CALCIUM 8.8*     DVT prophylaxis: Lovenox  Code Status: Full code  Family Communication: No family at bedside  Disposition Plan: likely SNF   Consultants:  None   Procedures:  None   Antibiotics:    Anti-infectives (From admission, onward)   None       Objective   Vitals:   08/09/18 1002 08/09/18 1644 08/09/18 2014 08/10/18 0500  BP: (!) 188/64 (!) 187/49 (!) 174/62 138/82  Pulse: (!) 53 (!) 51 (!) 54 60  Resp: 17 16 18 16   Temp: 97.8 F (36.6 C) (!) 97.5 F (36.4 C) 97.9 F (36.6 C) 98 F (36.7 C)  TempSrc: Oral Oral Oral Oral  SpO2: 98% 99% 100% 100%  Weight:      Height:        Intake/Output Summary (Last 24 hours) at 08/10/2018 1252 Last data filed at 08/09/2018 2014 Gross per 24 hour  Intake -  Output 400 ml  Net -400 ml   Filed Weights   08/08/18 2327 08/08/18 2328  Weight: 64.4 kg 64.8 kg     Physical Examination:    General:  Appears in no acute distress  Cardiovascular: S1S2 RRR, no murmur  Respiratory: Clear bilaterally, no wheezing  Abdomen: abdomen, soft, nontender  Extremities: no edema in lower extremities   Neurologic:  Alert oriented x 3    Liver Function Tests: Recent Labs  Lab 08/08/18 0504  AST 13*  ALT 12  ALKPHOS 49  BILITOT 0.6  PROT 6.3*  ALBUMIN 3.2*    Cardiac Enzymes: Recent Labs  Lab 08/08/18 0504 08/08/18 0710  TROPONINI 0.03* 0.03*      Studies: No results found.  Scheduled Meds: . amLODipine  5 mg Oral BID  .  aspirin EC  81 mg Oral QHS  . atorvastatin  40 mg Oral Daily  . enoxaparin (LOVENOX) injection  40 mg Subcutaneous Q24H  . finasteride  5 mg Oral Daily  . latanoprost  1 drop Both Eyes QHS  . losartan  50 mg Oral BID  . metoprolol succinate  50 mg Oral q morning - 10a  . pantoprazole  40 mg Oral Daily  . timolol  1 drop Both Eyes Daily      Time spent: 25 min  [facility] Pager [phone]. If 7PM-7AM, please contact night-coverage at [email or web address], Office  [phone]  password [email or web address]  08/10/2018, 12:52 PM  LOS: 0 days

## 2018-08-10 NOTE — Clinical Social Work Note (Addendum)
CSW was informed by nurse that patient would like short term rehab.  CSW contacted patient's insurance company and he is a Chiropractor will have to approve patient for SNF placement.  Patient's clinicals have been sent to insurance company and different SNFs awaiting response back.  CSW to continue to follow patient's progress throughout discharge planning.  CSW received phone call that patient is not [facility] and he is regular [employer], which means SNF will need to begin insurance authorization.  CSW awaiting for bed offers for patient, so SNF can begin insurance authorization.  Troy Ray. Troy Ray, MSW, [place]  08/10/2018 10:49 AM

## 2018-08-11 MED ORDER — HYDRALAZINE HCL 25 MG PO TABS: 25.0000 mg | ORAL_TABLET | Freq: Four times a day (QID) | ORAL | Status: DC | PRN

## 2018-08-11 NOTE — Progress Notes (Signed)
[facility]  PROGRESS NOTE  Troy Ray ZOX:[id number] DOB: 19-Feb-1928 DOA: 08/08/2018 PCP: [name], MD   Brief HPI:    84 y.o. male with medical history significant of CAD, stage 3 CKD, hypertension, prior T4 , T5 vertebroplasty, BPH, diverticulosis, hyperlipidemia, comes in for worsening back pain since one month. Pt was seen at [facility] yesterday and underwent extensive work up with CT angio of the chest , abdomen and pelvis, but no acute changes in the vertebra or new fractures evident.    Subjective   Patient seen and examined, denies any pain.  No chest pain or shortness of breath.   Assessment/Plan:     1. Severe back pain-likely from old fracture at T4 and T5.  Patient had vertebroplasty in the past.  Pain controlled with tramadol, IV Toradol.  PT OT recommends skilled nursing facility placement.  2. Hypertension-blood pressure is mildly elevated, continue amlodipine, metoprolol, losartan.  Will add hydralazine 25 mg p.o. every 6 hours PRN for BP greater than 160/100.  3. Hyperlipidemia-continue Lipitor  4. Coronary artery disease-continue aspirin  5. Extensive plaque formation in the abdominal aorta-CT angiogram of the abdomen showed extensive plaque formation in the abdominal aorta, maximum diameter 2.7 cm distally.  CVTS was consulted by ED physician.  Plan to follow-up as outpatient  6. Urinary retention-likely from BPH, home medications have been restarted.  Foley catheter placed.  UA is normal     CBG: No results for input(s): GLUCAP in the last 168 hours.  CBC: Recent Labs  Lab 08/08/18 0504  WBC 14.7*  NEUTROABS 10.1*  HGB 13.0  HCT 40.0  MCV 88.3  PLT 811    Basic Metabolic Panel: Recent Labs  Lab 08/08/18 0504  NA 136  K 3.9  CL 104  CO2 24  GLUCOSE 98  BUN 24*  CREATININE 1.29*  CALCIUM 8.8*     DVT prophylaxis: Lovenox  Code Status: Full code  Family Communication: No family at bedside  Disposition  Plan: likely SNF   Consultants:  None   Procedures:  None   Antibiotics:   Anti-infectives (From admission, onward)   None       Objective   Vitals:   08/10/18 0500 08/10/18 1255 08/10/18 2040 08/11/18 0534  BP: 138/82 (!) 180/77 (!) 179/68 (!) 185/83  Pulse: 60 62 68 71  Resp: 16 18 18 15   Temp: 98 F (36.7 C)  98.2 F (36.8 C) 98.2 F (36.8 C)  TempSrc: Oral  Oral Oral  SpO2: 100% 99% 97% 99%  Weight:      Height:        Intake/Output Summary (Last 24 hours) at 08/11/2018 1249 Last data filed at 08/10/2018 1700 Gross per 24 hour  Intake 1416.25 ml  Output 500 ml  Net 916.25 ml   Filed Weights   08/08/18 2327 08/08/18 2328  Weight: 64.4 kg 64.8 kg     Physical Examination:   Neck: Supple, no deformities, masses, or tenderness Lungs: Normal respiratory effort, bilateral clear to auscultation, no crackles or wheezes.  Heart: Regular rate and rhythm, S1 and S2 normal, no murmurs, rubs auscultated Abdomen: BS normoactive,soft,nondistended,non-tender to palpation,no organomegaly Extremities: No pretibial edema, no erythema, no cyanosis, no clubbing Neuro : Alert and oriented to time, place and person, No focal deficits    Liver Function Tests: Recent Labs  Lab 08/08/18 0504  AST 13*  ALT 12  ALKPHOS 49  BILITOT 0.6  PROT 6.3*  ALBUMIN 3.2*  Cardiac Enzymes: Recent Labs  Lab 08/08/18 0504 08/08/18 0710  TROPONINI 0.03* 0.03*      Studies: No results found.  Scheduled Meds: . amLODipine  5 mg Oral BID  . aspirin EC  81 mg Oral QHS  . atorvastatin  40 mg Oral Daily  . enoxaparin (LOVENOX) injection  40 mg Subcutaneous Q24H  . finasteride  5 mg Oral Daily  . latanoprost  1 drop Both Eyes QHS  . losartan  50 mg Oral BID  . metoprolol succinate  50 mg Oral q morning - 10a  . pantoprazole  40 mg Oral Daily  . timolol  1 drop Both Eyes Daily      Time spent: 25 min  [facility] Pager [phone]. If  7PM-7AM, please contact night-coverage at [email or web address], Office  [phone]  password [email or web address]  08/11/2018, 12:49 PM  LOS: 0 days

## 2018-08-12 NOTE — Progress Notes (Signed)
[facility]  PROGRESS NOTE  Troy Ray OIZ:[id number] DOB: 1928/03/09 DOA: 08/08/2018 PCP: [name], MD   Brief HPI:    84 y.o. male with medical history significant of CAD, stage 3 CKD, hypertension, prior T4 , T5 vertebroplasty, BPH, diverticulosis, hyperlipidemia, comes in for worsening back pain since one month. Pt was seen at [facility] yesterday and underwent extensive work up with CT angio of the chest , abdomen and pelvis, but no acute changes in the vertebra or new fractures evident.    Subjective   Patient seen and examined, denies new complaints.  Family wants Foley catheter out.   Assessment/Plan:     1. Severe back pain-likely from old fracture at T4 and T5.  Patient had vertebroplasty in the past.  Pain controlled with tramadol, IV Toradol.  PT OT recommends skilled nursing facility placement.  Awaiting skilled nursing facility placement  2. Hypertension-blood pressure is mildly elevated, continue amlodipine, metoprolol, losartan.  Will add hydralazine 25 mg p.o. every 6 hours PRN for BP greater than 160/100.  3. Hyperlipidemia-continue Lipitor  4. Coronary artery disease-continue aspirin  5. Extensive plaque formation in the abdominal aorta-CT angiogram of the abdomen showed extensive plaque formation in the abdominal aorta, maximum diameter 2.7 cm distally.  CVTS was consulted by ED physician.  Plan to follow-up as outpatient  6. Urinary retention-likely from BPH, home medications have been restarted.  Foley catheter placed.  UA is normal.  Will discontinue Foley catheter and start voiding trial.     CBG: No results for input(s): GLUCAP in the last 168 hours.  CBC: Recent Labs  Lab 08/08/18 0504  WBC 14.7*  NEUTROABS 10.1*  HGB 13.0  HCT 40.0  MCV 88.3  PLT 338    Basic Metabolic Panel: Recent Labs  Lab 08/08/18 0504  NA 136  K 3.9  CL 104  CO2 24  GLUCOSE 98  BUN 24*  CREATININE 1.29*  CALCIUM 8.8*     DVT  prophylaxis: Lovenox  Code Status: Full code  Family Communication: No family at bedside  Disposition Plan: likely SNF   Consultants:  None   Procedures:  None   Antibiotics:   Anti-infectives (From admission, onward)   None       Objective   Vitals:   08/11/18 2018 08/12/18 0613 08/12/18 1201 08/12/18 1338  BP: 139/74 136/71 (!) 175/78 (!) 169/61  Pulse: 76 70 61 (!) 58  Resp: 17 16 16    Temp: 98.1 F (36.7 C) 98.3 F (36.8 C)    TempSrc: Oral Oral    SpO2: 99% 98% 100%   Weight:      Height:        Intake/Output Summary (Last 24 hours) at 08/12/2018 1431 Last data filed at 08/12/2018 1115 Gross per 24 hour  Intake 2351.99 ml  Output 650 ml  Net 1701.99 ml   Filed Weights   08/08/18 2327 08/08/18 2328  Weight: 64.4 kg 64.8 kg     Physical Examination:   Mouth: Oral mucosa is moist, no lesions on palate,  Neck: Supple, no deformities, masses, or tenderness Lungs: Normal respiratory effort, bilateral clear to auscultation, no crackles or wheezes.  Heart: Regular rate and rhythm, S1 and S2 normal, no murmurs, rubs auscultated Abdomen: BS normoactive,soft,nondistended,non-tender to palpation,no organomegaly Extremities: No pretibial edema, no erythema, no cyanosis, no clubbing Neuro : Alert and oriented to time, place and person, No focal deficits     Liver Function Tests: Recent Labs  Lab 08/08/18 0504  AST 13*  ALT 12  ALKPHOS 49  BILITOT 0.6  PROT 6.3*  ALBUMIN 3.2*    Cardiac Enzymes: Recent Labs  Lab 08/08/18 0504 08/08/18 0710  TROPONINI 0.03* 0.03*      Studies: No results found.  Scheduled Meds: . amLODipine  5 mg Oral BID  . aspirin EC  81 mg Oral QHS  . atorvastatin  40 mg Oral Daily  . enoxaparin (LOVENOX) injection  40 mg Subcutaneous Q24H  . finasteride  5 mg Oral Daily  . latanoprost  1 drop Both Eyes QHS  . losartan  50 mg Oral BID  . metoprolol succinate  50 mg Oral q morning - 10a  . pantoprazole  40 mg  Oral Daily  . timolol  1 drop Both Eyes Daily      Time spent: 25 min  [facility] Pager [phone]. If 7PM-7AM, please contact night-coverage at [email or web address], Office  [phone]  password [email or web address]  08/12/2018, 2:31 PM  LOS: 0 days

## 2018-08-12 NOTE — Care Management Obs Status (Signed)
[facility] NOTIFICATION   Patient Details  Name: Troy Ray MRN: [id number] Date of Birth: 07-15-1928   Medicare Observation Status Notification Given:  Yes    Lynnell Catalan, RN 08/12/2018, 3:24 PM

## 2018-08-12 NOTE — Progress Notes (Addendum)
Provided patient's family with list of [facility] bed offers. They want to review facilities and will let CSW know of choice. CSW discussed [employer] auth process with them and encouraged them to make a selection soon so we can begin insurance auth.  Update: Patient's family chose [facility]. CSW contacted facility to begin [facility].    Pricilla Holm, MSW, [facility] Clinical Social Work [phone] (Weekend)

## 2018-08-12 NOTE — Progress Notes (Signed)
Physical Therapy Treatment Patient Details Name: Troy Ray MRN: [id number] DOB: Nov 26, 1927 Today's Date: 08/12/2018    History of Present Illness 84 yo male admitted with severe back pain. Hx of comp fx-s/p thoracic KP, CAD, CKD, hearing loss.     PT Comments    The patient has been medicated for back pain. Patient ambulated x 80' with min assist, steady assist for turning and backing up. Continue PT. Recommend SNF for rehab.   Follow Up Recommendations  SNF     Equipment Recommendations  None recommended by PT    Recommendations for Other Services       Precautions / Restrictions Precautions Precautions: Fall    Mobility  Bed Mobility Overal bed mobility: Needs Assistance Bed Mobility: Rolling;Sidelying to Sit;Sit to Supine Rolling: Supervision Sidelying to sit: Min assist Supine to sit: Min assist     General bed mobility comments: Increased time. Close guard for safety. Cues for technique.   Transfers Overall transfer level: Needs assistance Equipment used: Rolling walker (2 wheeled) Transfers: Sit to/from Stand Sit to Stand: Mod assist;From elevated surface;Min assist         General transfer comment: Min-assist to rise from bed raised, mod assist from recliner. Steady assist to balance upon standing. Multimodal cues for hand placement for standing and sitting.  Ambulation/Gait Ambulation/Gait assistance: Min assist;Mod assist Gait Distance (Feet): 80 Feet Assistive device: Rolling walker (2 wheeled) Gait Pattern/deviations: Step-through pattern;Decreased stride length;Trunk flexed     General Gait Details: steady asist for turns,  assist with keeping in straight line. Assist for backing up, loss of balance x 2./   Stairs             Wheelchair Mobility    Modified Rankin (Stroke Patients Only)       Balance Overall balance assessment: Needs assistance;History of Falls Sitting-balance support: Bilateral upper extremity supported;Feet  supported Sitting balance-Leahy Scale: Fair     Standing balance support: Bilateral upper extremity supported;During functional activity Standing balance-Leahy Scale: Poor                              Cognition Arousal/Alertness: Awake/alert Behavior During Therapy: WFL for tasks assessed/performed                                          Exercises      General Comments        Pertinent Vitals/Pain Faces Pain Scale: Hurts little more Pain Location: back Pain Descriptors / Indicators: Aching;Sore;Discomfort;Moaning Pain Intervention(s): Premedicated before session;Monitored during session    Home Living                      Prior Function            PT Goals (current goals can now be found in the care plan section) Progress towards PT goals: Progressing toward goals    Frequency    Min 2X/week      PT Plan Current plan remains appropriate;Frequency needs to be updated    Co-evaluation              AM-PAC PT "6 Clicks" Daily Activity  Outcome Measure    Difficulty moving from lying on back to sitting on the side of the bed? : A Lot Difficulty sitting down on and standing up from  a chair with arms (e.g., wheelchair, bedside commode, etc,.)?: Unable Help needed moving to and from a bed to chair (including a wheelchair)?: A Lot Help needed walking in hospital room?: A Lot Help needed climbing 3-5 steps with a railing? : Total 6 Click Score: 8    End of Session Equipment Utilized During Treatment: Gait belt Activity Tolerance: Patient tolerated treatment well Patient left: in bed;with call bell/phone within reach;with bed alarm set;with family/visitor present Nurse Communication: Mobility status PT Visit Diagnosis: Muscle weakness (generalized) (M62.81);Difficulty in walking, not elsewhere classified (R26.2);Pain;Unsteadiness on feet (R26.81);History of falling (Z91.81)     Time: 8916-9450 PT Time Calculation  (min) (ACUTE ONLY): 17 min  Charges:  $Gait Training: 8-22 mins                     Tresa Endo PT Acute Rehabilitation Services Pager [phone] Office [phone]    Claretha Cooper 08/12/2018, 2:32 PM

## 2018-08-13 ENCOUNTER — Observation Stay (HOSPITAL_COMMUNITY): Payer: Medicare HMO

## 2018-08-13 DIAGNOSIS — R4781 Slurred speech: Secondary | ICD-10-CM | POA: Diagnosis not present

## 2018-08-13 DIAGNOSIS — I63233 Cerebral infarction due to unspecified occlusion or stenosis of bilateral carotid arteries: Secondary | ICD-10-CM | POA: Diagnosis not present

## 2018-08-13 DIAGNOSIS — R4182 Altered mental status, unspecified: Secondary | ICD-10-CM | POA: Diagnosis not present

## 2018-08-13 DIAGNOSIS — R29818 Other symptoms and signs involving the nervous system: Secondary | ICD-10-CM | POA: Diagnosis not present

## 2018-08-13 LAB — GLUCOSE, CAPILLARY: GLUCOSE-CAPILLARY: 134 mg/dL — AB (ref 70–99)

## 2018-08-13 LAB — URINALYSIS, ROUTINE W REFLEX MICROSCOPIC
BACTERIA UA: NONE SEEN
Bilirubin Urine: NEGATIVE
Glucose, UA: NEGATIVE mg/dL
Ketones, ur: NEGATIVE mg/dL
Leukocytes, UA: NEGATIVE
Nitrite: NEGATIVE
PROTEIN: NEGATIVE mg/dL
Specific Gravity, Urine: 1.008 (ref 1.005–1.030)
pH: 7 (ref 5.0–8.0)

## 2018-08-13 MED ORDER — HYDRALAZINE HCL 20 MG/ML IJ SOLN
10.0000 mg | INTRAMUSCULAR | Status: DC | PRN
  Administered 2018-08-14 – 2018-08-16 (×2): 10 mg via INTRAVENOUS
  Filled 2018-08-13 (×2): qty 1

## 2018-08-13 MED ORDER — HYDRALAZINE HCL 20 MG/ML IJ SOLN
5.0000 mg | Freq: Once | INTRAMUSCULAR | Status: AC
  Administered 2018-08-13: 5 mg via INTRAVENOUS

## 2018-08-13 MED ORDER — DILTIAZEM HCL-DEXTROSE 100-5 MG/100ML-% IV SOLN (PREMIX): 5.0000 mg/h | INTRAVENOUS | Status: DC

## 2018-08-13 MED ORDER — SODIUM CHLORIDE 0.9 % IV SOLN
INTRAVENOUS | Status: DC
  Administered 2018-08-13 – 2018-08-16 (×6): via INTRAVENOUS

## 2018-08-13 MED ORDER — IOPAMIDOL (ISOVUE-370) INJECTION 76%
INTRAVENOUS | Status: AC
  Filled 2018-08-13: qty 100

## 2018-08-13 MED ORDER — CLOPIDOGREL BISULFATE 75 MG PO TABS
75.0000 mg | ORAL_TABLET | Freq: Every day | ORAL | Status: DC
  Administered 2018-08-14: 75 mg via ORAL
  Filled 2018-08-13: qty 1

## 2018-08-13 MED ORDER — HALOPERIDOL LACTATE 5 MG/ML IJ SOLN
2.0000 mg | Freq: Once | INTRAMUSCULAR | Status: AC
  Administered 2018-08-13: 2 mg via INTRAVENOUS
  Filled 2018-08-13 (×2): qty 1

## 2018-08-13 MED ORDER — HYDRALAZINE HCL 20 MG/ML IJ SOLN
5.0000 mg | Freq: Once | INTRAMUSCULAR | Status: AC
  Administered 2018-08-13: 5 mg via INTRAVENOUS
  Filled 2018-08-13: qty 1

## 2018-08-13 MED ORDER — HALOPERIDOL LACTATE 5 MG/ML IJ SOLN: 1.0000 mg | Freq: Once | INTRAMUSCULAR | Status: DC

## 2018-08-13 MED ORDER — CLOPIDOGREL BISULFATE 75 MG PO TABS
300.0000 mg | ORAL_TABLET | Freq: Once | ORAL | Status: AC
  Administered 2018-08-13: 300 mg via ORAL
  Filled 2018-08-13: qty 4

## 2018-08-13 MED ORDER — DOCUSATE SODIUM 100 MG PO CAPS
200.0000 mg | ORAL_CAPSULE | Freq: Every day | ORAL | Status: DC
  Administered 2018-08-13 – 2018-08-20 (×8): 200 mg via ORAL
  Filled 2018-08-13 (×8): qty 2

## 2018-08-13 MED ORDER — IOPAMIDOL (ISOVUE-370) INJECTION 76%
100.0000 mL | Freq: Once | INTRAVENOUS | Status: AC | PRN
  Administered 2018-08-13: 100 mL via INTRAVENOUS

## 2018-08-13 MED ORDER — HYDRALAZINE HCL 20 MG/ML IJ SOLN
5.0000 mg | INTRAMUSCULAR | Status: DC | PRN
  Administered 2018-08-13: 5 mg via INTRAVENOUS
  Filled 2018-08-13: qty 1

## 2018-08-13 NOTE — Consult Note (Signed)
[facility] TeleNeurology Consult Services  Impression:  Acute Ischemic Stroke He was admitted on October 3rd with intractable back pain PT got him up today and his right light 2:40 pm, ntoted right sided weakness. Pt had slurred speech and weakness, no tpa b.c on lovenox CTA head/neck is otherwise negative unless noted below in the note  IMPRESSION: 1. 50% diameter stenosis proximal right internal carotid artery. 2. 40% diameter stenosis proximal left internal carotid artery 3. Mild stenosis origin of the right vertebral artery and severe stenosis origin of left vertebral artery. Left vertebral artery origin at the arch. 4. Mild stenosis in the cavernous carotid bilaterally. Negative for emergent large vessel occlusion. 5. Severe atherosclerotic disease in the thoracic aorta with luminal narrowing and marked mural thrombus or plaque in the descending thoracic aorta.     Not a tpa candidate due to:   Lovenox over the last 24 hours STAT neuro consult          Comments:  STAT  Recommendations:    Start antiplatelet if no obvious contraindication Stroke protocol admission/ orderset suggested with placement on stroke floor tele monitoring Bedside swallow evaluation HOB less than 30 degrees IV Fluid hydration with NS Euglycemia avoid hyperthermia, PRN acetaminophen dvt ppx  Consider inpatient neurology consultation Discussed with ED MD Please call with questions  -----------------------------------------------------------------------------------------  CC  History of Present Illness   The patient was noted to be confused, and dysarthric and noted to have right sided weakness. he is on lovenox, no tpa.  Past medical history is noted for PSH, CAD, Hypoplasty, T4 fracture, CKD3, and HLD3, and hyperlipidemia.  CTA ABD/Pelvis, CHEST, MRI of T-Spine all negative.      Diagnostic:  I visualized CT head is negative, for any bleed.    Exam:  Patient is in  no apparent distress. Patient appears as stated age. No obvious acute respiratory or cardiac distress. Patient is well groomed and well-nourished. NIHSS score:2  1A: Level of Consciousness - Requires repeated stimulation to arouse 0 1B: Ask Month and Age - 0 Questions Right 0 1C: 'Blink Eyes' & 'Squeeze Hands' - Performs 0 Tasks 0 2: Test Horizontal Extraocular Movements - Partial Gaze Palsy: Corrects with Oculocephalic Reflex 0 3: Test Visual Fields - No Visual Loss 0 4: Test Facial Palsy - Normal symmetry 0 5A: Test Left Arm Motor Drift - Some Effort Against Gravity 0 5B: Test Right Arm Motor Drift - Some Effort Against Gravity1 6A: Test Left Leg Motor Drift - Some Effort Against Gravity 0 6B: Test Right Leg Motor Drift - Some Effort Against Gravity 1 7: Test Limb Ataxia - Ataxia in 2 Limbs 0 8: Test Sensation - Complete Loss: Cannot Sense Being Touched At All 0 9: Test Language/Aphasia- Severe Aphasia: Fragmentary Expression, Inference Needed, Cannot Identify Materials 0 10: Test Dysarthria - Mute/Anarthric 0 11: Test Extinction/Inattention - Extinction to bilateral simultaneous stimulation 0    Medical Decision Making:  - Extensive number of diagnosis or management options are considered above. - Extensive amount of complex data reviewed. - High risk of complication and/or morbidity or mortality are associated with differential diagnostic considerations above.  - There may be Uncertain outcome and increased probability of prolonged functional impairment or high probability of severe prolonged functional impairment associated with some of these differential diagnosis.  Medical Data Reviewed:  1.Data reviewed include clinical labs, radiology,Medical Tests; 2.Tests results discussed w/performing or interpreting physician; 3.Obtaining/reviewing old medical records;  4.Obtaining case history from another source;  5.Independent review  of image, tracing or specimen.   Patient was  informed the Neurology Consult would happen via TeleHealth consult by way of interactive audio and video telecommunications and consented to receiving care in this manner.     ----------- GET VASC SURGERY ON BOARD FOR AORTIC MURAL THROMBUS

## 2018-08-13 NOTE — Progress Notes (Signed)
Called by RN that patient became agitated, ordered 2 mg of Haldol IV x1.  Very minimal improvement. Patient seen and examined, confused, agitated We will check bladder scan Check urinalysis  Continue close monitoring.

## 2018-08-13 NOTE — Progress Notes (Signed)
Occupational Therapy Treatment Patient Details Name: Troy Ray MRN: [id number] DOB: November 26, 1927 Today's Date: 08/13/2018    History of present illness 84 yo male admitted with severe back pain. Hx of comp fx-s/p thoracic KP, CAD, CKD, hearing loss.    OT comments  Pt needed much more assistance for balance today:  Leaning both posteriorly and to L.  Only sidestepped up [facility] with +2 for safety.    Follow Up Recommendations  SNF    Equipment Recommendations  3 in 1 bedside commode    Recommendations for Other Services      Precautions / Restrictions Precautions Precautions: Fall Restrictions Weight Bearing Restrictions: No       Mobility Bed Mobility     Rolling: Mod assist Sidelying to sit: Mod assist Supine to sit: Mod assist     General bed mobility comments: assist for all bed mobility. Raised HOB for back to bed to decrease back strain as pt was not following cues for sidelying  Transfers   Equipment used: Rolling walker (2 wheeled)   Sit to Stand: Mod assist;From elevated surface(max A for balance)         General transfer comment: assist to rise and stabilize.  Needed max A to maintain static standing    Balance     Sitting balance-Leahy Scale: Poor Sitting balance - Comments: pt unable to maintain balance without one arm support.  leaning backwards and to L     Standing balance-Leahy Scale: Zero Standing balance comment: max A to maintain standing balance.  Strong posterior lean and also leaned to L at times                           ADL either performed or assessed with clinical judgement   ADL       Grooming: Minimal assistance(unsupported sitting)                                 General ADL Comments: Worked on sit to stand x 2. Had planned to walk to bathroom, but pt only able to sidestep towards [facility] with +2 assistance; unsafe to ambulate     Vision       Perception     Praxis      Cognition  Arousal/Alertness: Awake/alert Behavior During Therapy: [facility] for tasks assessed/performed                                   General Comments: multimodal cues at times; pt is [facility]        Exercises     Shoulder Instructions       General Comments much worse leaning from evaluation. NT came to assist and stated he moved better earlier    Pertinent Vitals/ Pain       Pain Assessment: Faces Faces Pain Scale: Hurts a little bit Pain Location: back Pain Descriptors / Indicators: Aching;Sore;Discomfort;Moaning Pain Intervention(s): Limited activity within patient's tolerance;Monitored during session;Repositioned  Home Living                                          Prior Functioning/Environment              Frequency  Min 2X/week  Progress Toward Goals  OT Goals(current goals can now be found in the care plan section)  Progress towards OT goals: Not progressing toward goals - comment(worsened status since evaluation)     Plan      Co-evaluation                 AM-PAC PT "6 Clicks" Daily Activity     Outcome Measure   Help from another person eating meals?: A Little Help from another person taking care of personal grooming?: A Little Help from another person toileting, which includes using toliet, bedpan, or urinal?: A Lot Help from another person bathing (including washing, rinsing, drying)?: A Lot Help from another person to put on and taking off regular upper body clothing?: A Lot Help from another person to put on and taking off regular lower body clothing?: Total 6 Click Score: 13    End of Session    OT Visit Diagnosis: Unsteadiness on feet (R26.81);Muscle weakness (generalized) (M62.81)   Activity Tolerance Patient limited by fatigue   Patient Left in bed;with call bell/phone within reach;with bed alarm set;with family/visitor present   Nurse Communication (worsening lean to L as well as posterior)         Time: 7017-7939 OT Time Calculation (min): 24 min  Charges: OT General Charges $OT Visit: 1 Visit OT Treatments $Therapeutic Activity: 23-37 mins  Lesle Chris, OTR/L Acute Rehabilitation Services [phone] [place] pager [phone] office 08/13/2018   Troy Ray 08/13/2018, 2:38 PM

## 2018-08-13 NOTE — Progress Notes (Signed)
Information filed in [place] timeline filed for Yehuda Budd, Therapist, sports.

## 2018-08-13 NOTE — Progress Notes (Addendum)
Day attending called this NP to report pt was in a code stroke now. CT head was neg for acute. NP to bedside. Pt back from CT. Tele neuro on monitor. Exam carried out but difficult to discern how much is true because of pt's confusion and extreme HOH. + weaker on RUE with some pronator drift. Follows some commands but not others (? Hearing). Speech is clear. No facial droop noted. Alert. Oriented to self, birthday, but not place nor time. Tele neuro ordered CTA head and neck and pt taken back down to CT. Awaiting those results now. NP spoke with family and updated them on symptoms, diagnostic tests and invasive vs non invasive treatment of findings. Will call tele neuro when CTA back. Transfer to [facility] depends on findings and whether pt is a candidate for a procedure. Not a candidate for TPA since pt is on Lovenox. NP doubts he is a candidate for anything other than medical intervention given his age, and multiple comorbidities. He is on a statin, BB, ARB, and 81mg  ASA. Hx Afib. Pt was in RVR when code stroke was called, but is not now. So, will hold off on the ordered Cardizem drip. Foley placed due to critical status. VSS. Per RN, pt is less weak and speech is clearer than 2 hours ago. Will follow. KJKG, NP [facility] Update: CTA head and neck back, read by radiology and also the tele neurologist. After that, NP reviewed tele neuro recs and spoke to on call neurologist, Dr. Lorraine Lax. Dr. Lorraine Lax agreed with starting anti platelet and pt was loaded with Plavix 300mg  and will start Plavix 75mg  tomorrow for 3 weeks. Echo and MRI brain ordered for tomorrow. There is no interventional/invasive treatment to be done tonight, and likely pt will just stay on medical therapy. [place] team will follow. BP goal less than 180/110. Hydralazine IV prn ordered.  Pt has mural thrombus on CTA. Discussed with neuro. No hurry to get vascular involved. Will call consult in am.  Because of neuro and vascular being involved, pt will be  transferred to [facility] when bed available.  Family updated again. Discussed code status with pt's son who states that his father would not want to be coded or placed on a ventilator. Given, pt's next of kin is his wife, this will be verified before order is changed to DNR. Son instructed to let RN know after he speaks to pt's wife tonight. RN to call NP. Pt passed his stroke swallow study and diet order placed. FYI: someone said pt has dementia but family denies.  KJKG, NP [facility] Update: Son talked to pt's wife. She said "absolutely not code the pt. Order changed to DNR. Total critical care time: 60 + 30 minutes Critical care time was exclusive of separately billable procedures and treating other patients. Critical care was necessary to treat or prevent imminent or life-threatening deterioration. Critical care was time spent personally by me on the following activities: development of treatment plan with patient and/or surrogate as well as nursing, discussions with consultants, evaluation of patient's response to treatment, examination of patient, obtaining history from patient or surrogate, ordering and performing treatments and interventions, ordering and review of laboratory studies, ordering and review of radiographic studies, pulse oximetry and re-evaluation of patient's condition.

## 2018-08-13 NOTE — Progress Notes (Signed)
[facility]  PROGRESS NOTE  Troy Ray OVZ:[id number] DOB: 1927/11/21 DOA: 08/08/2018 PCP: [name], MD   Brief HPI:    84 y.o. male with medical history significant of CAD, stage 3 CKD, hypertension, prior T4 , T5 vertebroplasty, BPH, diverticulosis, hyperlipidemia, comes in for worsening back pain since one month. Pt was seen at [facility] yesterday and underwent extensive work up with CT angio of the chest , abdomen and pelvis, but no acute changes in the vertebra or new fractures evident.    Subjective   Patient seen and examined, denies any complaints.  Back pain is fairly controlled.   Assessment/Plan:     1. Severe back pain-likely from old fracture at T4 and T5.  Patient had vertebroplasty in the past.  Pain controlled with tramadol, IV Toradol.  PT OT recommends skilled nursing facility placement.  Awaiting skilled nursing facility placement insurance authorization.  2. Hypertension-blood pressure is mildly elevated, continue amlodipine, metoprolol, losartan.  Added hydralazine 25 mg p.o. every 6 hours PRN for BP greater than 160/100.  3. Hyperlipidemia-continue Lipitor  4. Coronary artery disease-continue aspirin  5. Extensive plaque formation in the abdominal aorta-CT angiogram of the abdomen showed extensive plaque formation in the abdominal aorta, maximum diameter 2.7 cm distally.  CVTS was consulted by ED physician.  Plan to follow-up as outpatient  6. Urinary retention-likely from BPH, home medications have been restarted.  Foley catheter was placed, and it has been discontinued.  UA is normal.      CBG: No results for input(s): GLUCAP in the last 168 hours.  CBC: Recent Labs  Lab 08/08/18 0504  WBC 14.7*  NEUTROABS 10.1*  HGB 13.0  HCT 40.0  MCV 88.3  PLT 412    Basic Metabolic Panel: Recent Labs  Lab 08/08/18 0504  NA 136  K 3.9  CL 104  CO2 24  GLUCOSE 98  BUN 24*  CREATININE 1.29*  CALCIUM 8.8*     DVT  prophylaxis: Lovenox  Code Status: Full code  Family Communication: No family at bedside  Disposition Plan: likely SNF   Consultants:  None   Procedures:  None   Antibiotics:   Anti-infectives (From admission, onward)   None       Objective   Vitals:   08/12/18 1338 08/12/18 1722 08/12/18 2047 08/13/18 0544  BP: (!) 169/61 (!) 178/66 (!) 178/68 (!) 171/93  Pulse: (!) 58 68 64 84  Resp:  18 19 18   Temp:  98 F (36.7 C) 97.9 F (36.6 C) 98.5 F (36.9 C)  TempSrc:  Oral Oral Oral  SpO2:  97% 95% 95%  Weight:      Height:        Intake/Output Summary (Last 24 hours) at 08/13/2018 1350 Last data filed at 08/13/2018 0745 Gross per 24 hour  Intake 1086.91 ml  Output 200 ml  Net 886.91 ml   Filed Weights   08/08/18 2327 08/08/18 2328  Weight: 64.4 kg 64.8 kg     Physical Examination:  Mouth: Oral mucosa is moist, no lesions on palate,  Neck: Supple, no deformities, masses, or tenderness Lungs: Normal respiratory effort, bilateral clear to auscultation, no crackles or wheezes.  Heart: Regular rate and rhythm, S1 and S2 normal, no murmurs, rubs auscultated Abdomen: BS normoactive,soft,nondistended,non-tender to palpation,no organomegaly Extremities: No pretibial edema, no erythema, no cyanosis, no clubbing Neuro : Alert and oriented to time, place and person, No focal deficits     Liver Function Tests:  Recent Labs  Lab 08/08/18 0504  AST 13*  ALT 12  ALKPHOS 49  BILITOT 0.6  PROT 6.3*  ALBUMIN 3.2*    Cardiac Enzymes: Recent Labs  Lab 08/08/18 0504 08/08/18 0710  TROPONINI 0.03* 0.03*      Studies: No results found.  Scheduled Meds: . amLODipine  5 mg Oral BID  . aspirin EC  81 mg Oral QHS  . atorvastatin  40 mg Oral Daily  . enoxaparin (LOVENOX) injection  40 mg Subcutaneous Q24H  . finasteride  5 mg Oral Daily  . haloperidol lactate  1 mg Intravenous Once  . latanoprost  1 drop Both Eyes QHS  . losartan  50 mg Oral BID  .  metoprolol succinate  50 mg Oral q morning - 10a  . pantoprazole  40 mg Oral Daily  . timolol  1 drop Both Eyes Daily      Time spent: 25 min  [facility] Pager [phone]. If 7PM-7AM, please contact night-coverage at [email or web address], Office  [phone]  password [place]  08/13/2018, 1:50 PM  LOS: 0 days

## 2018-08-13 NOTE — Progress Notes (Addendum)
Pts displayed new onset slurred speech,weakness, lethargy and confusion at 1815.The nurse called rapid response and EKG revealed new onset atrial fibrillation. Rapid response called code stroke and patient was taken to CT. Patient was transferred to ICU for further workup.

## 2018-08-14 ENCOUNTER — Observation Stay (HOSPITAL_COMMUNITY): Payer: Medicare HMO

## 2018-08-14 ENCOUNTER — Observation Stay (HOSPITAL_BASED_OUTPATIENT_CLINIC_OR_DEPARTMENT_OTHER): Payer: Medicare HMO

## 2018-08-14 DIAGNOSIS — I700 Atherosclerosis of aorta: Secondary | ICD-10-CM

## 2018-08-14 DIAGNOSIS — L8990 Pressure ulcer of unspecified site, unspecified stage: Secondary | ICD-10-CM

## 2018-08-14 DIAGNOSIS — I340 Nonrheumatic mitral (valve) insufficiency: Secondary | ICD-10-CM | POA: Diagnosis not present

## 2018-08-14 DIAGNOSIS — E782 Mixed hyperlipidemia: Secondary | ICD-10-CM

## 2018-08-14 DIAGNOSIS — I2583 Coronary atherosclerosis due to lipid rich plaque: Secondary | ICD-10-CM

## 2018-08-14 DIAGNOSIS — M546 Pain in thoracic spine: Secondary | ICD-10-CM

## 2018-08-14 DIAGNOSIS — D72829 Elevated white blood cell count, unspecified: Secondary | ICD-10-CM

## 2018-08-14 DIAGNOSIS — J181 Lobar pneumonia, unspecified organism: Secondary | ICD-10-CM | POA: Diagnosis not present

## 2018-08-14 DIAGNOSIS — R4781 Slurred speech: Secondary | ICD-10-CM | POA: Diagnosis not present

## 2018-08-14 DIAGNOSIS — M8008XG Age-related osteoporosis with current pathological fracture, vertebra(e), subsequent encounter for fracture with delayed healing: Secondary | ICD-10-CM

## 2018-08-14 DIAGNOSIS — I351 Nonrheumatic aortic (valve) insufficiency: Secondary | ICD-10-CM | POA: Diagnosis not present

## 2018-08-14 DIAGNOSIS — I10 Essential (primary) hypertension: Secondary | ICD-10-CM

## 2018-08-14 DIAGNOSIS — I2510 Atherosclerotic heart disease of native coronary artery without angina pectoris: Secondary | ICD-10-CM

## 2018-08-14 DIAGNOSIS — J90 Pleural effusion, not elsewhere classified: Secondary | ICD-10-CM | POA: Diagnosis not present

## 2018-08-14 DIAGNOSIS — G459 Transient cerebral ischemic attack, unspecified: Secondary | ICD-10-CM

## 2018-08-14 DIAGNOSIS — I6523 Occlusion and stenosis of bilateral carotid arteries: Secondary | ICD-10-CM

## 2018-08-14 LAB — URINALYSIS, ROUTINE W REFLEX MICROSCOPIC
BILIRUBIN URINE: NEGATIVE
Glucose, UA: NEGATIVE mg/dL
HGB URINE DIPSTICK: NEGATIVE
Ketones, ur: 5 mg/dL — AB
Leukocytes, UA: NEGATIVE
Nitrite: NEGATIVE
PH: 6 (ref 5.0–8.0)
Protein, ur: NEGATIVE mg/dL
SPECIFIC GRAVITY, URINE: 1.029 (ref 1.005–1.030)

## 2018-08-14 LAB — LIPID PANEL
CHOLESTEROL: 115 mg/dL (ref 0–200)
HDL: 35 mg/dL — ABNORMAL LOW (ref >40)
LDL CALC: 66 mg/dL (ref 0–99)
TRIGLYCERIDES: 69 mg/dL (ref <150)
Total CHOL/HDL Ratio: 3.3 RATIO
VLDL: 14 mg/dL (ref 0–40)

## 2018-08-14 LAB — HEMOGLOBIN A1C
Hgb A1c MFr Bld: 5.7 % — ABNORMAL HIGH (ref 4.8–5.6)
Mean Plasma Glucose: 116.89 mg/dL

## 2018-08-14 LAB — CBC WITH DIFFERENTIAL/PLATELET
ABS IMMATURE GRANULOCYTES: 0.05 10*3/uL (ref 0.00–0.07)
BASOS ABS: 0 10*3/uL (ref 0.0–0.1)
Basophils Relative: 0 %
Eosinophils Absolute: 0.1 10*3/uL (ref 0.0–0.5)
Eosinophils Relative: 1 %
HEMATOCRIT: 33 % — AB (ref 39.0–52.0)
Hemoglobin: 10.9 g/dL — ABNORMAL LOW (ref 13.0–17.0)
IMMATURE GRANULOCYTES: 0 %
LYMPHS ABS: 2 10*3/uL (ref 0.7–4.0)
LYMPHS PCT: 15 %
MCH: 28.8 pg (ref 26.0–34.0)
MCHC: 33 g/dL (ref 30.0–36.0)
MCV: 87.3 fL (ref 80.0–100.0)
Monocytes Absolute: 0.9 10*3/uL (ref 0.1–1.0)
Monocytes Relative: 7 %
NEUTROS ABS: 10.1 10*3/uL — AB (ref 1.7–7.7)
NEUTROS PCT: 77 %
NRBC: 0 % (ref 0.0–0.2)
Platelets: 215 10*3/uL (ref 150–400)
RBC: 3.78 MIL/uL — AB (ref 4.22–5.81)
RDW: 14.9 % (ref 11.5–15.5)
WBC: 13.2 10*3/uL — AB (ref 4.0–10.5)

## 2018-08-14 LAB — BASIC METABOLIC PANEL
ANION GAP: 8 (ref 5–15)
BUN: 11 mg/dL (ref 8–23)
CO2: 19 mmol/L — AB (ref 22–32)
CREATININE: 0.94 mg/dL (ref 0.61–1.24)
Calcium: 8 mg/dL — ABNORMAL LOW (ref 8.9–10.3)
Chloride: 103 mmol/L (ref 98–111)
GFR calc non Af Amer: >60 mL/min (ref >60)
Glucose, Bld: 113 mg/dL — ABNORMAL HIGH (ref 70–99)
Potassium: 3.3 mmol/L — ABNORMAL LOW (ref 3.5–5.1)
SODIUM: 130 mmol/L — AB (ref 135–145)

## 2018-08-14 LAB — ECHOCARDIOGRAM COMPLETE
HEIGHTINCHES: 69 in
WEIGHTICAEL: 2430.35 [oz_av]

## 2018-08-14 LAB — TSH: TSH: 3.513 u[IU]/mL (ref 0.350–4.500)

## 2018-08-14 MED ORDER — POTASSIUM CHLORIDE CRYS ER 20 MEQ PO TBCR
40.0000 meq | EXTENDED_RELEASE_TABLET | Freq: Two times a day (BID) | ORAL | Status: DC
  Administered 2018-08-15 – 2018-08-21 (×13): 40 meq via ORAL
  Filled 2018-08-14 (×13): qty 2

## 2018-08-14 MED ORDER — POTASSIUM CHLORIDE 20 MEQ PO PACK
40.0000 meq | PACK | Freq: Two times a day (BID) | ORAL | Status: DC
  Filled 2018-08-14: qty 2

## 2018-08-14 NOTE — Progress Notes (Signed)
  Pt admitted to the unit. Pt is stable, alert and oriented per baseline. Oriented to room, staff, and call bell. Educated to call for any assistance. Bed in lowest position, call bell within reach- will continue to monitor. 

## 2018-08-14 NOTE — Progress Notes (Signed)
CSW following for discharge plan. CSW confirmed bed at [facility] and that authorization has been received. Patient not medically ready at this time. CSW to follow.  Bed available and authorization received if patient medically ready for discharge tomorrow.  Laveda Abbe, [place] Clinical Social Worker [phone]

## 2018-08-14 NOTE — Progress Notes (Signed)
On call neurologist paged on [place].

## 2018-08-14 NOTE — Progress Notes (Addendum)
STROKE TEAM PROGRESS NOTE   INTERVAL HISTORY His wife and daughter are at the bedside.  They are extremely concerned given his new confusion, much different than yesterday when wife left around 2p. They are relieved he did not have a stroke but concerned about etiology. He received haldol yesterday at 1636 along with ongoing ultram and toradol during hospitalization. They are adamant he had no memory deficits prior to admission.   Vitals:   08/14/18 0251 08/14/18 0300 08/14/18 0429 08/14/18 0748  BP: (!) 164/79  (!) 149/77 (!) 172/67  Pulse: 75  83 79  Resp:  17 (!) 22 18  Temp:   97.8 F (36.6 C) 98.9 F (37.2 C)  TempSrc:   Axillary Oral  SpO2:   98% 97%  Weight:      Height:        CBC:  Recent Labs  Lab 08/08/18 0504  WBC 14.7*  NEUTROABS 10.1*  HGB 13.0  HCT 40.0  MCV 88.3  PLT 161    Basic Metabolic Panel:  Recent Labs  Lab 08/08/18 0504  NA 136  K 3.9  CL 104  CO2 24  GLUCOSE 98  BUN 24*  CREATININE 1.29*  CALCIUM 8.8*   Lipid Panel:     Component Value Date/Time   CHOL 126 12/06/2017 1510   TRIG 192.0 (H) 12/06/2017 1510   HDL 40.80 12/06/2017 1510   CHOLHDL 3 12/06/2017 1510   VLDL 38.4 12/06/2017 1510   LDLCALC 47 12/06/2017 1510   HgbA1c:  Lab Results  Component Value Date   HGBA1C 6.2 12/06/2017   Urine Drug Screen:     Component Value Date/Time   LABOPIA NONE DETECTED 04/12/2017 1356   COCAINSCRNUR NONE DETECTED 04/12/2017 1356   LABBENZ NONE DETECTED 04/12/2017 1356   AMPHETMU NONE DETECTED 04/12/2017 1356   THCU NONE DETECTED 04/12/2017 1356   LABBARB NONE DETECTED 04/12/2017 1356    Alcohol Level     Component Value Date/Time   ETH <5 04/12/2017 1356    IMAGING Ct Angio Head W Or Wo Contrast  Result Date: 08/13/2018 CLINICAL DATA:  Mental status change. Rule out stroke. Left-sided weakness and slurred speech EXAM: CT ANGIOGRAPHY HEAD AND NECK TECHNIQUE: Multidetector CT imaging of the head and neck was performed using the  standard protocol during bolus administration of intravenous contrast. Multiplanar CT image reconstructions and MIPs were obtained to evaluate the vascular anatomy. Carotid stenosis measurements (when applicable) are obtained utilizing NASCET criteria, using the distal internal carotid diameter as the denominator. CONTRAST:  137mL ISOVUE-370 IOPAMIDOL (ISOVUE-370) INJECTION 76% COMPARISON:  CT head 08/13/2018 FINDINGS: CTA NECK FINDINGS Aortic arch: Advanced atherosclerotic disease aortic arch. Marked luminal narrowing of the descending thoracic aorta due to mural clot. No acute dissection flap. No change from recent chest CT of 08/08/2018 Right carotid system: Right common carotid artery widely patent. Atherosclerotic calcification of the right carotid bifurcation. 50% diameter stenosis proximal right internal carotid artery. Left carotid system: Mild atherosclerotic disease left common carotid artery. Atherosclerotic plaque in the proximal left internal carotid artery. 40% diameter stenosis of the left internal carotid artery above the bifurcation. Vertebral arteries: Severe stenosis of the origin of the left vertebral artery which arises from the aortic arch. Calcific plaque at the origin of the right vertebral artery with mild narrowing. Right vertebral artery dominant. Both vertebral arteries patent to the basilar. Skeleton: Cervical spondylosis. Compression fractures with vertebroplasty cement at T6 and T7. Other neck: Negative Upper chest: Lung apices are clear.  Motion degraded images. Review of the MIP images confirms the above findings CTA HEAD FINDINGS Anterior circulation: Atherosclerotic calcification throughout the cavernous carotid bilaterally with mild stenosis bilaterally. Anterior and middle cerebral arteries patent without stenosis or branch occlusion Posterior circulation: Right vertebral artery dominant. Both vertebral arteries patent to the basilar. Left PICA patent. Right PICA not visualized.  Right AICA may supply this territory. Basilar patent. Superior cerebellar and posterior cerebral arteries patent bilaterally patent posterior communicating artery on the right. Venous sinuses: Patent Anatomic variants: None Delayed phase: Not performed Review of the MIP images confirms the above findings IMPRESSION: 1. 50% diameter stenosis proximal right internal carotid artery. 2. 40% diameter stenosis proximal left internal carotid artery 3. Mild stenosis origin of the right vertebral artery and severe stenosis origin of left vertebral artery. Left vertebral artery origin at the arch. 4. Mild stenosis in the cavernous carotid bilaterally. Negative for emergent large vessel occlusion. 5. Severe atherosclerotic disease in the thoracic aorta with luminal narrowing and marked mural thrombus or plaque in the descending thoracic aorta. Electronically Signed   By: Franchot Gallo M.D.   On: 08/13/2018 20:17   Ct Angio Neck W Or Wo Contrast  Result Date: 08/13/2018 CLINICAL DATA:  Mental status change. Rule out stroke. Left-sided weakness and slurred speech EXAM: CT ANGIOGRAPHY HEAD AND NECK TECHNIQUE: Multidetector CT imaging of the head and neck was performed using the standard protocol during bolus administration of intravenous contrast. Multiplanar CT image reconstructions and MIPs were obtained to evaluate the vascular anatomy. Carotid stenosis measurements (when applicable) are obtained utilizing NASCET criteria, using the distal internal carotid diameter as the denominator. CONTRAST:  122mL ISOVUE-370 IOPAMIDOL (ISOVUE-370) INJECTION 76% COMPARISON:  CT head 08/13/2018 FINDINGS: CTA NECK FINDINGS Aortic arch: Advanced atherosclerotic disease aortic arch. Marked luminal narrowing of the descending thoracic aorta due to mural clot. No acute dissection flap. No change from recent chest CT of 08/08/2018 Right carotid system: Right common carotid artery widely patent. Atherosclerotic calcification of the right  carotid bifurcation. 50% diameter stenosis proximal right internal carotid artery. Left carotid system: Mild atherosclerotic disease left common carotid artery. Atherosclerotic plaque in the proximal left internal carotid artery. 40% diameter stenosis of the left internal carotid artery above the bifurcation. Vertebral arteries: Severe stenosis of the origin of the left vertebral artery which arises from the aortic arch. Calcific plaque at the origin of the right vertebral artery with mild narrowing. Right vertebral artery dominant. Both vertebral arteries patent to the basilar. Skeleton: Cervical spondylosis. Compression fractures with vertebroplasty cement at T6 and T7. Other neck: Negative Upper chest: Lung apices are clear.  Motion degraded images. Review of the MIP images confirms the above findings CTA HEAD FINDINGS Anterior circulation: Atherosclerotic calcification throughout the cavernous carotid bilaterally with mild stenosis bilaterally. Anterior and middle cerebral arteries patent without stenosis or branch occlusion Posterior circulation: Right vertebral artery dominant. Both vertebral arteries patent to the basilar. Left PICA patent. Right PICA not visualized. Right AICA may supply this territory. Basilar patent. Superior cerebellar and posterior cerebral arteries patent bilaterally patent posterior communicating artery on the right. Venous sinuses: Patent Anatomic variants: None Delayed phase: Not performed Review of the MIP images confirms the above findings IMPRESSION: 1. 50% diameter stenosis proximal right internal carotid artery. 2. 40% diameter stenosis proximal left internal carotid artery 3. Mild stenosis origin of the right vertebral artery and severe stenosis origin of left vertebral artery. Left vertebral artery origin at the arch. 4. Mild stenosis in the cavernous carotid  bilaterally. Negative for emergent large vessel occlusion. 5. Severe atherosclerotic disease in the thoracic aorta with  luminal narrowing and marked mural thrombus or plaque in the descending thoracic aorta. Electronically Signed   By: Franchot Gallo M.D.   On: 08/13/2018 20:17   Mr Brain Wo Contrast  Result Date: 08/14/2018 CLINICAL DATA:  Slurred speech and weakness, beginning at 4:15 p.m. EXAM: MRI HEAD WITHOUT CONTRAST TECHNIQUE: Multiplanar, multiecho pulse sequences of the brain and surrounding structures were obtained without intravenous contrast. COMPARISON:  CTA head neck 08/13/2018 FINDINGS: BRAIN: There is no acute infarct, acute hemorrhage, hydrocephalus or extra-axial collection. The midline structures are normal. No midline shift or other mass effect. Diffuse confluent hyperintense T2-weighted signal within the periventricular, deep and juxtacortical white matter, most commonly due to chronic ischemic microangiopathy. Generalized atrophy without lobar predilection. Susceptibility-sensitive sequences show no chronic microhemorrhage or superficial siderosis. VASCULAR: Major intracranial arterial and venous sinus flow voids are normal. SKULL AND UPPER CERVICAL SPINE: Calvarial bone marrow signal is normal. There is no skull base mass. Visualized upper cervical spine and soft tissues are normal. SINUSES/ORBITS: Small amount of bilateral mastoid fluid. Paranasal sinuses are clear. There are bilateral lens replacements. IMPRESSION: 1. No acute intracranial abnormality. 2. Advanced atrophy and chronic ischemic microangiopathic changes. Electronically Signed   By: Ulyses Jarred M.D.   On: 08/14/2018 02:52   Ct Head Code Stroke Wo Contrast  Result Date: 08/13/2018 CLINICAL DATA:  Code stroke.  Slurred speech ataxia EXAM: CT HEAD WITHOUT CONTRAST TECHNIQUE: Contiguous axial images were obtained from the base of the skull through the vertex without intravenous contrast. COMPARISON:  MRI 05/20/2017 FINDINGS: Brain: Moderate atrophy. Moderate chronic microvascular ischemic change in the white matter. Negative for acute  infarct, hemorrhage, or mass. Vascular: Negative for hyperdense vessel Skull: Negative Sinuses/Orbits: Negative Other: None ASPECTS ([place] Stroke Program Early CT Score) - Ganglionic level infarction (caudate, lentiform nuclei, internal capsule, insula, M1-M3 cortex): 7 - Supraganglionic infarction (M4-M6 cortex): 3 Total score (0-10 with 10 being normal): 10 IMPRESSION: 1. No acute intracranial abnormality. 2. ASPECTS is 10 3. Moderate atrophy and moderate chronic microvascular ischemic change in the white matter. 4. Electronically Signed   By: Franchot Gallo M.D.   On: 08/13/2018 18:53   2D Echocardiogram  - Left ventricle: The cavity size was normal. Systolic function was   normal. The estimated ejection fraction was in the range of 55%   to 60%. Images were inadequate for LV wall motion assessment. - Aortic valve: There was mild regurgitation. - Mitral valve: There was mild regurgitation. - Right ventricle: Poorly visualized. - Right atrium: Poorly visualized. - Tricuspid valve: Poorly visualized.   PHYSICAL EXAM General: Scruffy elderly male who appears well-developed, well nourished Eyes: No scleral injection HENT: No OP obstrucion Head: Normocephalic.  Cardiovascular: Normal rate and regular rhythm.  Respiratory: Effort normal and breath sounds normal to anterior ascultation Skin: WDI  Neurological Examination Mental Status: Awake, alert, confused.  Patient knows his name, his family's name but not that he is in a hospital. Age and month is incorrect.  No obvious aphasia was appreciated - can name, repeat.  Follows one and two step commands intermittently. Overall, inattentive, needing constant redirection. cranial Nerves: II: Visual fields grossly normal,  III,IV, VI: ptosis not present, extra-ocular motions intact bilaterally, pupils equal, round, reactive to light and accommodation V,VII: smile symmetric, facial light touch sensation normal bilaterally VIII: Bilaterally  impaired - HOH IX,X: uvula rises symmetrically XI: bilateral shoulder shrug intact XII: midline  tongue extension Motor: Right :  Upper extremity   5/5                                      Left:     Upper extremity   5/5             Lower extremity   3/5                                                  Lower extremity   4/5 Tone and bulk:normal tone throughout; no atrophy noted Sensory: intact bilaterally Plantars: Right: downgoing                                Left: downgoing Cerebellar: No obvious ataxia noted and patient was moving his arms Gait: Unable to obtain    ASSESSMENT/PLAN [name] is a 84 y.o. male with history of CAD s/p CABG, HTN, HLD, carotid disease, LBP s/p vertebroplasty, PVD and CKD II who has been at [facility] for about 1 week when he developed confusion, R side weakness, slurred speech per nursing staff. No HP found on neuro exam.  Confusion / encephalopathy, no stroke/TIA  Receiving toradol and ultram for LBP.   Received haldol yest at 4481  Code Stroke CT head No acute stroke. Small vessel disease. Atrophy. ASPECTS 10.     CTA head & neck R ICA 50%, L ICA 40%. Mild stenosis R VA, cavernous B ICAs. Severe stenosis L VA, thoracic aorta, mural thrombus/plaque descending thoracic aorta  MRI  No acute stroke. Small vessel disease. Atrophy.   2D Echo  EF 55-60%. No source of embolus   LDL 66  WBC elevated 10/3 - UA normal, CXR normal - ? recheck  HgbA1c 5.7  Lovenox 40 mg sq daily for VTE prophylaxis  aspirin 81 mg daily prior to admission, now on aspirin 81 mg daily and clopidogrel 75 mg daily following load. Continue aspirin 81 mg and plavix 75 mg daily x 3 weeks, then aspirin alone.   Therapy recommendations:  SNF  Disposition:  pending   Hypertension  Stable . BP goal normotensive  Hyperlipidemia  Home meds:  lipitor 40, resumed in hospital  LDL 66, goal < 70  Continue statin at discharge  Other Stroke Risk Factors  Advanced  age  Former Cigarette smoker  ETOH use, advised to drink no more than 2 drink(s) a day  Family hx stroke (father)  Coronary artery disease s/p CABG  PVD  Carotid stenosis - R 60-79%, L 10-59%  Other Active Problems  LBP s/p vertebroplasty. Planned SNF for rehab prior to "stroke symptoms"  CKD stage II  Confusion not stroke related. Likely medical etiology. Stroke Service will sign off. Please call for any concerns.   Hospital day # 0  Burnetta Sabin, MSN, APRN, ANVP-BC, AGPCNP-BC Advanced Practice Stroke Nurse [place] for Schedule & Pager information 08/14/2018 9:11 AM   ATTENDING NOTE: I reviewed above note and agree with the assessment and plan. Pt was seen and examined.   84 year old male with history of CAD status post CABG in 2000, HLD, carotid artery stenosis, lower back pain s/p vertebroplasty and spinal  injection admitted for worsening lower back pain.  Neurology was consulted due to slurry speech, confusion after receiving Haldol.  MRI and CT head negative for acute stroke.  CT head and neck showed 50% right ICA and 40% left ICA bifurcation stenosis, left VA origin severe stenosis, and descending aorta are severe atherosclerosis with mural thrombus.  EF 55 to 60%.  UA negative for UTI.  WBC 14.7, creatinine 1.29 LDL 66, and A1c 0.7.  Patient exam showed awake alert, orientated to self, age and people, but not orientated to time and place.  Cognitive impairment with diminished attention and concentration.  No significant cranial nerve deficit, bilateral upper extremity 3/5 with prominent asterixis, bilateral lower extremity 2/5 with pain on movement.  Patient complained of lower back pain, abdominal pain and right shoulder blade pain.  CT head and neck showed severe descending aorta are atherosclerosis with mural thrombus and stenosis of aortic artery.  Bilateral ICA bifurcation 40 to 50% stenosis with significant soft plaque and atherosclerosis.   However, there is no indication for carotid surgery at this time and also patient not a good candidate for cardiovascular surgery.  However, patient likely need close follow-up as outpatient with vascular surgery and cardiovascular surgery.  Patient current condition consistent with encephalopathy, delirium.  Likely related to medication as it happened after receiving Haldol.  However, patient does have leukocytosis, hyponatremia, high blood pressure, with bilateral asterixis, also concerning for metabolic encephalopathy.  Treatment as per primary team.  Resume home aspirin and Lipitor for stroke prevention.  Patient has been following with Dr. Felecia Shelling at [facility] for lower back pain.  Will need continued neurology follow-up.  Neurology will sign off. Please call with questions. Pt will follow up with Dr. Felecia Shelling at [facility] in about 4 weeks. Thanks for the consult.   Rosalin Hawking, MD PhD Stroke Neurology 08/14/2018 2:47 PM     To contact Stroke Continuity provider, please refer to [email or web address] After hours, contact General Neurology

## 2018-08-14 NOTE — Progress Notes (Signed)
Gave report to Sydell Axon, Therapist, sports from [employer].

## 2018-08-14 NOTE — Progress Notes (Signed)
PT Cancellation Note  Patient Details Name: Troy Ray MRN: [id number] DOB: 07-Aug-1928   Cancelled Treatment:    Reason Eval/Treat Not Completed: Patient at procedure or test/unavailable. Pt off of the floor. PT will continue to follow acutely to progress mobility as appropriate.    [place] 08/14/2018, 8:32 AM

## 2018-08-14 NOTE — Progress Notes (Signed)
PROGRESS NOTE  Troy Ray JME:[id number] DOB: 1928/09/11 DOA: 08/08/2018 PCP: [name], MD   LOS: 0 days   Brief Narrative / Interim history: 84 y.o.malewith medical history significant ofCAD, stage 3 CKD, hypertension, prior T4 , T5 vertebroplasty, BPH, diverticulosis and  hyperlipidemia initially admitted to [facility] from [facility] with worsening back pain for 1 months.  Patient had extensive work up with CT angio of the chest , abdomen and pelvis, but no acute changes in the vertebra or new fractures evident.  Patient developed agitation about 5 PM and was given IV Haldol 2 mg once.  Then he developed slurred speech, weakness, lethargy and confusion about 3 hours later that raise concern for CVA.  Patient on Lovenox not a TPA candidate.  Tele-neuro ordered.  CT head, CTA head and neck obtained and did not reveal acute finding to explain his symptoms.  Loaded with Plavix 300 mg.  Transferred to [facility]. MRI brain without acute finding to explain his symptoms this morning.  Echocardiogram basically normal.  Subjective: Remains confused since he was given Haldol yesterday.  Still with some slurred speech.  No other focal neuro deficit.  Family reports intermittent cough.   Assessment & Plan: Active Problems:   Hyperlipidemia, mixed   Essential hypertension   Coronary atherosclerosis   BPH (benign prostatic hyperplasia)   Leukocytosis   Vertebral fracture, osteoporotic (HCC)   Back pain   Pressure injury of skin  Acute encephalopathy: Likely iatrogenic based on temporal association.  Started after Haldol injection.  MRI brain not impressive.  History of memory loss in his chart but family denies. -Neurology following -Avoid sedating medications.  Discontinue Zanaflex. -Delirium precautions -May consider Seroquel versus Haldol if safety concerns due to agitation. -Basic labs, TSH and chest x-ray -PT/OT/SLP  Back pain: Appears chronic.  Likely due to old fracture of T4  and T5.  Status post vertebroplasty the past. -Scheduled Tylenol -PRN Toradol -PT OT when mentally  Hypertension: SBP as high as 220.  DBP as high as 138.  Currently 172/67.  -DC IV fluids.  Doubt need for permissive hypertension.  History of AAA: Benefit from normalizing BP  CKD 2: Stable. -Check renal function  CAD: ?history of CABG. Does not appear to have cardiopulmonary symptoms. -Check EKG and troponin x1  BPH: Foley in place and draining.  Mural thrombus/plaque descending thoracic aorta -Likely outpatient follow-up.  Scheduled Meds: . aspirin EC  81 mg Oral QHS  . atorvastatin  40 mg Oral Daily  . clopidogrel  75 mg Oral Daily  . docusate sodium  200 mg Oral QHS  . enoxaparin (LOVENOX) injection  40 mg Subcutaneous Q24H  . finasteride  5 mg Oral Daily  . latanoprost  1 drop Both Eyes QHS  . losartan  50 mg Oral BID  . metoprolol succinate  50 mg Oral q morning - 10a  . pantoprazole  40 mg Oral Daily  . timolol  1 drop Both Eyes Daily   Continuous Infusions: . sodium chloride 100 mL/hr at 08/14/18 0046   PRN Meds:.hydrALAZINE, traMADol  DVT prophylaxis: Lovenox Code Status: Full code Family Communication: Patient's wife and daughter at bedside.  Discussed findings and plan as above. Disposition Plan: Continue stepdown  Consultants:   Neurology  Procedures:   None  Antimicrobials:  None  Objective: Vitals:   08/14/18 0251 08/14/18 0300 08/14/18 0429 08/14/18 0748  BP: (!) 164/79  (!) 149/77 (!) 172/67  Pulse: 75  83 79  Resp:  17 (!) 22 18  Temp:   97.8 F (36.6 C) 98.9 F (37.2 C)  TempSrc:   Axillary Oral  SpO2:   98% 97%  Weight:      Height:        Intake/Output Summary (Last 24 hours) at 08/14/2018 1238 Last data filed at 08/14/2018 0400 Gross per 24 hour  Intake 1541.54 ml  Output 1840 ml  Net -298.46 ml   Filed Weights   08/08/18 2327 08/08/18 2328 08/13/18 2000  Weight: 64.4 kg 64.8 kg 68.9 kg    Examination:  GENERAL:  Awake but confused and disoriented. HEENT: PERRLA, MMM.  Mild left facial droop probably chronic. NECK: Supple.  No rigidity. LUNGS:  No IWOB, good air movement, CTAB HEART:  RRR with no M/R/G ABD:  soft, NT with active BS EXT: No edema.  No rigidity. NEURO: Awake but confused and disoriented.  Slurred speech.  Minimal left facial droop.  Symmetric reflex.  PERRLA.  Further neuro exam limited due to patient's inability to cooperate. PSYCH: Confused and disoriented   Data Reviewed: I have independently reviewed following labs and imaging studies.  CBC: Recent Labs  Lab 08/08/18 0504  WBC 14.7*  NEUTROABS 10.1*  HGB 13.0  HCT 40.0  MCV 88.3  PLT 573   Basic Metabolic Panel: Recent Labs  Lab 08/08/18 0504  NA 136  K 3.9  CL 104  CO2 24  GLUCOSE 98  BUN 24*  CREATININE 1.29*  CALCIUM 8.8*   GFR: Estimated Creatinine Clearance: 38.6 mL/min (A) (by C-G formula based on SCr of 1.29 mg/dL (H)). Liver Function Tests: Recent Labs  Lab 08/08/18 0504  AST 13*  ALT 12  ALKPHOS 49  BILITOT 0.6  PROT 6.3*  ALBUMIN 3.2*   No results for input(s): LIPASE, AMYLASE in the last 168 hours. No results for input(s): AMMONIA in the last 168 hours. Coagulation Profile: No results for input(s): INR, PROTIME in the last 168 hours. Cardiac Enzymes: Recent Labs  Lab 08/08/18 0504 08/08/18 0710  TROPONINI 0.03* 0.03*   BNP (last 3 results) No results for input(s): PROBNP in the last 8760 hours. HbA1C: Recent Labs    08/14/18 1008  HGBA1C 5.7*   CBG: Recent Labs  Lab 08/13/18 1828  GLUCAP 134*   Lipid Profile: Recent Labs    08/14/18 1008  CHOL 115  HDL 35*  LDLCALC 66  TRIG 69  CHOLHDL 3.3   Thyroid Function Tests: No results for input(s): TSH, T4TOTAL, FREET4, T3FREE, THYROIDAB in the last 72 hours. Anemia Panel: No results for input(s): VITAMINB12, FOLATE, FERRITIN, TIBC, IRON, RETICCTPCT in the last 72 hours. Urine analysis:    Component Value Date/Time    COLORURINE STRAW (A) 08/13/2018 2007   APPEARANCEUR CLEAR 08/13/2018 2007   LABSPEC 1.008 08/13/2018 2007   PHURINE 7.0 08/13/2018 2007   GLUCOSEU NEGATIVE 08/13/2018 2007   HGBUR SMALL (A) 08/13/2018 2007   HGBUR small 07/04/2010 [place] NEGATIVE 08/13/2018 2007   [place] NEGATIVE 08/13/2018 2007   PROTEINUR NEGATIVE 08/13/2018 2007   UROBILINOGEN 0.2 08/20/2014 1056   NITRITE NEGATIVE 08/13/2018 2007   LEUKOCYTESUR NEGATIVE 08/13/2018 2007   Sepsis Labs: Invalid input(s): PROCALCITONIN, LACTICIDVEN  No results found for this or any previous visit (from the past 240 hour(s)).    Radiology Studies: Ct Angio Head W Or Wo Contrast  Result Date: 08/13/2018 CLINICAL DATA:  Mental status change. Rule out stroke. Left-sided weakness and slurred speech EXAM: CT ANGIOGRAPHY HEAD AND NECK  TECHNIQUE: Multidetector CT imaging of the head and neck was performed using the standard protocol during bolus administration of intravenous contrast. Multiplanar CT image reconstructions and MIPs were obtained to evaluate the vascular anatomy. Carotid stenosis measurements (when applicable) are obtained utilizing NASCET criteria, using the distal internal carotid diameter as the denominator. CONTRAST:  12mL ISOVUE-370 IOPAMIDOL (ISOVUE-370) INJECTION 76% COMPARISON:  CT head 08/13/2018 FINDINGS: CTA NECK FINDINGS Aortic arch: Advanced atherosclerotic disease aortic arch. Marked luminal narrowing of the descending thoracic aorta due to mural clot. No acute dissection flap. No change from recent chest CT of 08/08/2018 Right carotid system: Right common carotid artery widely patent. Atherosclerotic calcification of the right carotid bifurcation. 50% diameter stenosis proximal right internal carotid artery. Left carotid system: Mild atherosclerotic disease left common carotid artery. Atherosclerotic plaque in the proximal left internal carotid artery. 40% diameter stenosis of the left internal carotid  artery above the bifurcation. Vertebral arteries: Severe stenosis of the origin of the left vertebral artery which arises from the aortic arch. Calcific plaque at the origin of the right vertebral artery with mild narrowing. Right vertebral artery dominant. Both vertebral arteries patent to the basilar. Skeleton: Cervical spondylosis. Compression fractures with vertebroplasty cement at T6 and T7. Other neck: Negative Upper chest: Lung apices are clear.  Motion degraded images. Review of the MIP images confirms the above findings CTA HEAD FINDINGS Anterior circulation: Atherosclerotic calcification throughout the cavernous carotid bilaterally with mild stenosis bilaterally. Anterior and middle cerebral arteries patent without stenosis or branch occlusion Posterior circulation: Right vertebral artery dominant. Both vertebral arteries patent to the basilar. Left PICA patent. Right PICA not visualized. Right AICA may supply this territory. Basilar patent. Superior cerebellar and posterior cerebral arteries patent bilaterally patent posterior communicating artery on the right. Venous sinuses: Patent Anatomic variants: None Delayed phase: Not performed Review of the MIP images confirms the above findings IMPRESSION: 1. 50% diameter stenosis proximal right internal carotid artery. 2. 40% diameter stenosis proximal left internal carotid artery 3. Mild stenosis origin of the right vertebral artery and severe stenosis origin of left vertebral artery. Left vertebral artery origin at the arch. 4. Mild stenosis in the cavernous carotid bilaterally. Negative for emergent large vessel occlusion. 5. Severe atherosclerotic disease in the thoracic aorta with luminal narrowing and marked mural thrombus or plaque in the descending thoracic aorta. Electronically Signed   By: Franchot Gallo M.D.   On: 08/13/2018 20:17   Ct Angio Neck W Or Wo Contrast  Result Date: 08/13/2018 CLINICAL DATA:  Mental status change. Rule out stroke.  Left-sided weakness and slurred speech EXAM: CT ANGIOGRAPHY HEAD AND NECK TECHNIQUE: Multidetector CT imaging of the head and neck was performed using the standard protocol during bolus administration of intravenous contrast. Multiplanar CT image reconstructions and MIPs were obtained to evaluate the vascular anatomy. Carotid stenosis measurements (when applicable) are obtained utilizing NASCET criteria, using the distal internal carotid diameter as the denominator. CONTRAST:  114mL ISOVUE-370 IOPAMIDOL (ISOVUE-370) INJECTION 76% COMPARISON:  CT head 08/13/2018 FINDINGS: CTA NECK FINDINGS Aortic arch: Advanced atherosclerotic disease aortic arch. Marked luminal narrowing of the descending thoracic aorta due to mural clot. No acute dissection flap. No change from recent chest CT of 08/08/2018 Right carotid system: Right common carotid artery widely patent. Atherosclerotic calcification of the right carotid bifurcation. 50% diameter stenosis proximal right internal carotid artery. Left carotid system: Mild atherosclerotic disease left common carotid artery. Atherosclerotic plaque in the proximal left internal carotid artery. 40% diameter stenosis of the left internal  carotid artery above the bifurcation. Vertebral arteries: Severe stenosis of the origin of the left vertebral artery which arises from the aortic arch. Calcific plaque at the origin of the right vertebral artery with mild narrowing. Right vertebral artery dominant. Both vertebral arteries patent to the basilar. Skeleton: Cervical spondylosis. Compression fractures with vertebroplasty cement at T6 and T7. Other neck: Negative Upper chest: Lung apices are clear.  Motion degraded images. Review of the MIP images confirms the above findings CTA HEAD FINDINGS Anterior circulation: Atherosclerotic calcification throughout the cavernous carotid bilaterally with mild stenosis bilaterally. Anterior and middle cerebral arteries patent without stenosis or branch  occlusion Posterior circulation: Right vertebral artery dominant. Both vertebral arteries patent to the basilar. Left PICA patent. Right PICA not visualized. Right AICA may supply this territory. Basilar patent. Superior cerebellar and posterior cerebral arteries patent bilaterally patent posterior communicating artery on the right. Venous sinuses: Patent Anatomic variants: None Delayed phase: Not performed Review of the MIP images confirms the above findings IMPRESSION: 1. 50% diameter stenosis proximal right internal carotid artery. 2. 40% diameter stenosis proximal left internal carotid artery 3. Mild stenosis origin of the right vertebral artery and severe stenosis origin of left vertebral artery. Left vertebral artery origin at the arch. 4. Mild stenosis in the cavernous carotid bilaterally. Negative for emergent large vessel occlusion. 5. Severe atherosclerotic disease in the thoracic aorta with luminal narrowing and marked mural thrombus or plaque in the descending thoracic aorta. Electronically Signed   By: Franchot Gallo M.D.   On: 08/13/2018 20:17   Mr Brain Wo Contrast  Result Date: 08/14/2018 CLINICAL DATA:  Slurred speech and weakness, beginning at 4:15 p.m. EXAM: MRI HEAD WITHOUT CONTRAST TECHNIQUE: Multiplanar, multiecho pulse sequences of the brain and surrounding structures were obtained without intravenous contrast. COMPARISON:  CTA head neck 08/13/2018 FINDINGS: BRAIN: There is no acute infarct, acute hemorrhage, hydrocephalus or extra-axial collection. The midline structures are normal. No midline shift or other mass effect. Diffuse confluent hyperintense T2-weighted signal within the periventricular, deep and juxtacortical white matter, most commonly due to chronic ischemic microangiopathy. Generalized atrophy without lobar predilection. Susceptibility-sensitive sequences show no chronic microhemorrhage or superficial siderosis. VASCULAR: Major intracranial arterial and venous sinus flow  voids are normal. SKULL AND UPPER CERVICAL SPINE: Calvarial bone marrow signal is normal. There is no skull base mass. Visualized upper cervical spine and soft tissues are normal. SINUSES/ORBITS: Small amount of bilateral mastoid fluid. Paranasal sinuses are clear. There are bilateral lens replacements. IMPRESSION: 1. No acute intracranial abnormality. 2. Advanced atrophy and chronic ischemic microangiopathic changes. Electronically Signed   By: Ulyses Jarred M.D.   On: 08/14/2018 02:52   Ct Head Code Stroke Wo Contrast  Result Date: 08/13/2018 CLINICAL DATA:  Code stroke.  Slurred speech ataxia EXAM: CT HEAD WITHOUT CONTRAST TECHNIQUE: Contiguous axial images were obtained from the base of the skull through the vertex without intravenous contrast. COMPARISON:  MRI 05/20/2017 FINDINGS: Brain: Moderate atrophy. Moderate chronic microvascular ischemic change in the white matter. Negative for acute infarct, hemorrhage, or mass. Vascular: Negative for hyperdense vessel Skull: Negative Sinuses/Orbits: Negative Other: None ASPECTS ([place] Stroke Program Early CT Score) - Ganglionic level infarction (caudate, lentiform nuclei, internal capsule, insula, M1-M3 cortex): 7 - Supraganglionic infarction (M4-M6 cortex): 3 Total score (0-10 with 10 being normal): 10 IMPRESSION: 1. No acute intracranial abnormality. 2. ASPECTS is 10 3. Moderate atrophy and moderate chronic microvascular ischemic change in the white matter. 4. Electronically Signed   By: Franchot Gallo M.D.  On: 08/13/2018 18:53      Taylor Levick T. [facility] Pager [phone]  If 7PM-7AM, please contact night-coverage [email or web address] Password [email or web address] 08/14/2018, 12:38 PM

## 2018-08-14 NOTE — Consult Note (Addendum)
Requesting Physician: Dr. Darrick Meigs, Forrest Moron PA-C    Chief Complaint: Right side weakness, slurred speech   History obtained from: Patient and Chart    HPI:                                                                                                                                       Troy Ray is an 84 y.o. male past medical history of coronary artery disease, CAD, hyperlipidemia, vertebroplasty admitted at [facility] long for worsening back pain. Around 6:15 PM yesterday patient noted to have slurred speech, right-sided weakness and confusion and was stroke alerted.  Was evaluated by tele-neurology who scored him 2 on the NIH stroke scale and deemed him not to be a TPA candidate as he was on Lovenox.  CT angiogram was obtained which showed mural thrombus/plaque in the descending aorta as well as 40% stenosis in the left carotid artery.    Patient was transferred to [facility] for further stroke work-up.  On assessment patient did not appear to have any weakness of the right upper extremity, however is confused and did not cooperate well on examination.    Date last known well: 10.8.19 Time last known well: Unclear, ?2.40 pm tPA Given: no, per tele neurology as patient on lovenox  Baseline MRS 3   Past Medical History:  Diagnosis Date  . Adenomatous colon polyp   . BPH (benign prostatic hyperplasia) 07/14/2013  . CAD (coronary artery disease)   . Carotid stenosis, bilateral    right 60-79% stenosed.  Left 40-59%  . Chronic kidney disease    chronic, stage II  . CRI (chronic renal insufficiency)   . Diverticulosis   . Dysphagia   . Esophageal stricture    Dr [name]  . Glaucoma   . Headache(784.0) 12/01/2013  . Hyperlipidemia   . Hypertension   . Insomnia 05/12/2015  . Low back pain syndrome 04/03/2014  . Medicare annual wellness visit, subsequent 02/04/2015  . Memory loss   . Postural lightheadedness   . PVD (peripheral vascular disease) ([place])   . Renal insufficiency   .  Sensorineural hearing loss, bilateral   . Unspecified constipation 12/29/2012    Past Surgical History:  Procedure Laterality Date  . ABDOMINAL AORTIC ANEURYSM REPAIR  1996   Dr [name]  . CORONARY ARTERY BYPASS GRAFT    . EYE SURGERY     cataracts b/l and laser  . HERNIA REPAIR  03/23/05   left inguinal Lichtenstein repair, with mesh.  [name]  . IR [facility] THORACIC WITH BONE BIOPSY  11/08/2017  . IR RADIOLOGIST EVAL & MGMT  10/22/2017  . IR VERTEBROPLASTY CERV/THOR BX INC UNI/BIL INC/INJECT/IMAGING  06/25/2018  . RENAL ARTERY STENT  1996   Dr [name].  Bilateral stenting    Family History  Problem Relation Age of Onset  . Heart disease Father  CAD  . Coronary artery disease Father   . Stroke Father   . Hearing loss Father   . Hearing loss Paternal Grandfather   . Heart disease Son   . Heart disease Brother        MI  . Cancer Neg Hx   . Diabetes Neg Hx    Social History:  reports that he has quit smoking. He has never used smokeless tobacco. He reports that he drinks about 1.0 standard drinks of alcohol per week. He reports that he does not use drugs.  Allergies: No Known Allergies  Medications:                                                                                                                        I reviewed home medications   ROS:                                                                                                                                     Difficult to obtain due to mental status   Examination:                                                                                                      General: Appears well-developed  Psych: Affect appropriate to situation Eyes: No scleral injection HENT: No OP obstrucion Head: Normocephalic.  Cardiovascular: Normal rate and regular rhythm.  Respiratory: Effort normal and breath sounds normal to anterior ascultation GI: Soft.  No distension. There is no tenderness.  Skin:  WDI    Neurological Examination Mental Status: Alert, confused.  Patient knew he was in a hospital, but not the name.  Age and month is incorrect.  No obvious aphasia was appreciated.  Follows simple commands intermittently  cranial Nerves: II: Visual fields grossly normal,  III,IV, VI: ptosis not present, extra-ocular motions intact bilaterally, pupils equal, round, reactive to light and accommodation V,VII: smile symmetric, facial light touch sensation normal bilaterally VIII: Bilaterally impaired IX,X: uvula rises  symmetrically XI: bilateral shoulder shrug XII: midline tongue extension Motor: Right : Upper extremity   5/5    Left:     Upper extremity   5/5  Lower extremity   3/5     Lower extremity   4/5 Tone and bulk:normal tone throughout; no atrophy noted Sensory: Difficulty to obtain due to lack of cooperation Deep Tendon Reflexes:symmetric throughout Plantars: Right: downgoing   Left: downgoing Cerebellar: No obvious ataxia noted and patient was moving his arms Gait: Unable to obtain     Lab Results: Basic Metabolic Panel: Recent Labs  Lab 08/08/18 0504  NA 136  K 3.9  CL 104  CO2 24  GLUCOSE 98  BUN 24*  CREATININE 1.29*  CALCIUM 8.8*    CBC: Recent Labs  Lab 08/08/18 0504  WBC 14.7*  NEUTROABS 10.1*  HGB 13.0  HCT 40.0  MCV 88.3  PLT 218    Coagulation Studies: No results for input(s): LABPROT, INR in the last 72 hours.  Imaging: Ct Angio Head W Or Wo Contrast  Result Date: 08/13/2018 CLINICAL DATA:  Mental status change. Rule out stroke. Left-sided weakness and slurred speech EXAM: CT ANGIOGRAPHY HEAD AND NECK TECHNIQUE: Multidetector CT imaging of the head and neck was performed using the standard protocol during bolus administration of intravenous contrast. Multiplanar CT image reconstructions and MIPs were obtained to evaluate the vascular anatomy. Carotid stenosis measurements (when applicable) are obtained utilizing NASCET criteria, using  the distal internal carotid diameter as the denominator. CONTRAST:  129mL ISOVUE-370 IOPAMIDOL (ISOVUE-370) INJECTION 76% COMPARISON:  CT head 08/13/2018 FINDINGS: CTA NECK FINDINGS Aortic arch: Advanced atherosclerotic disease aortic arch. Marked luminal narrowing of the descending thoracic aorta due to mural clot. No acute dissection flap. No change from recent chest CT of 08/08/2018 Right carotid system: Right common carotid artery widely patent. Atherosclerotic calcification of the right carotid bifurcation. 50% diameter stenosis proximal right internal carotid artery. Left carotid system: Mild atherosclerotic disease left common carotid artery. Atherosclerotic plaque in the proximal left internal carotid artery. 40% diameter stenosis of the left internal carotid artery above the bifurcation. Vertebral arteries: Severe stenosis of the origin of the left vertebral artery which arises from the aortic arch. Calcific plaque at the origin of the right vertebral artery with mild narrowing. Right vertebral artery dominant. Both vertebral arteries patent to the basilar. Skeleton: Cervical spondylosis. Compression fractures with vertebroplasty cement at T6 and T7. Other neck: Negative Upper chest: Lung apices are clear.  Motion degraded images. Review of the MIP images confirms the above findings CTA HEAD FINDINGS Anterior circulation: Atherosclerotic calcification throughout the cavernous carotid bilaterally with mild stenosis bilaterally. Anterior and middle cerebral arteries patent without stenosis or branch occlusion Posterior circulation: Right vertebral artery dominant. Both vertebral arteries patent to the basilar. Left PICA patent. Right PICA not visualized. Right AICA may supply this territory. Basilar patent. Superior cerebellar and posterior cerebral arteries patent bilaterally patent posterior communicating artery on the right. Venous sinuses: Patent Anatomic variants: None Delayed phase: Not performed Review  of the MIP images confirms the above findings IMPRESSION: 1. 50% diameter stenosis proximal right internal carotid artery. 2. 40% diameter stenosis proximal left internal carotid artery 3. Mild stenosis origin of the right vertebral artery and severe stenosis origin of left vertebral artery. Left vertebral artery origin at the arch. 4. Mild stenosis in the cavernous carotid bilaterally. Negative for emergent large vessel occlusion. 5. Severe atherosclerotic disease in the thoracic aorta with luminal narrowing and marked mural thrombus or  plaque in the descending thoracic aorta. Electronically Signed   By: Franchot Gallo M.D.   On: 08/13/2018 20:17   Ct Angio Neck W Or Wo Contrast  Result Date: 08/13/2018 CLINICAL DATA:  Mental status change. Rule out stroke. Left-sided weakness and slurred speech EXAM: CT ANGIOGRAPHY HEAD AND NECK TECHNIQUE: Multidetector CT imaging of the head and neck was performed using the standard protocol during bolus administration of intravenous contrast. Multiplanar CT image reconstructions and MIPs were obtained to evaluate the vascular anatomy. Carotid stenosis measurements (when applicable) are obtained utilizing NASCET criteria, using the distal internal carotid diameter as the denominator. CONTRAST:  152mL ISOVUE-370 IOPAMIDOL (ISOVUE-370) INJECTION 76% COMPARISON:  CT head 08/13/2018 FINDINGS: CTA NECK FINDINGS Aortic arch: Advanced atherosclerotic disease aortic arch. Marked luminal narrowing of the descending thoracic aorta due to mural clot. No acute dissection flap. No change from recent chest CT of 08/08/2018 Right carotid system: Right common carotid artery widely patent. Atherosclerotic calcification of the right carotid bifurcation. 50% diameter stenosis proximal right internal carotid artery. Left carotid system: Mild atherosclerotic disease left common carotid artery. Atherosclerotic plaque in the proximal left internal carotid artery. 40% diameter stenosis of the left  internal carotid artery above the bifurcation. Vertebral arteries: Severe stenosis of the origin of the left vertebral artery which arises from the aortic arch. Calcific plaque at the origin of the right vertebral artery with mild narrowing. Right vertebral artery dominant. Both vertebral arteries patent to the basilar. Skeleton: Cervical spondylosis. Compression fractures with vertebroplasty cement at T6 and T7. Other neck: Negative Upper chest: Lung apices are clear.  Motion degraded images. Review of the MIP images confirms the above findings CTA HEAD FINDINGS Anterior circulation: Atherosclerotic calcification throughout the cavernous carotid bilaterally with mild stenosis bilaterally. Anterior and middle cerebral arteries patent without stenosis or branch occlusion Posterior circulation: Right vertebral artery dominant. Both vertebral arteries patent to the basilar. Left PICA patent. Right PICA not visualized. Right AICA may supply this territory. Basilar patent. Superior cerebellar and posterior cerebral arteries patent bilaterally patent posterior communicating artery on the right. Venous sinuses: Patent Anatomic variants: None Delayed phase: Not performed Review of the MIP images confirms the above findings IMPRESSION: 1. 50% diameter stenosis proximal right internal carotid artery. 2. 40% diameter stenosis proximal left internal carotid artery 3. Mild stenosis origin of the right vertebral artery and severe stenosis origin of left vertebral artery. Left vertebral artery origin at the arch. 4. Mild stenosis in the cavernous carotid bilaterally. Negative for emergent large vessel occlusion. 5. Severe atherosclerotic disease in the thoracic aorta with luminal narrowing and marked mural thrombus or plaque in the descending thoracic aorta. Electronically Signed   By: Franchot Gallo M.D.   On: 08/13/2018 20:17   Mr Brain Wo Contrast  Result Date: 08/14/2018 CLINICAL DATA:  Slurred speech and weakness,  beginning at 4:15 p.m. EXAM: MRI HEAD WITHOUT CONTRAST TECHNIQUE: Multiplanar, multiecho pulse sequences of the brain and surrounding structures were obtained without intravenous contrast. COMPARISON:  CTA head neck 08/13/2018 FINDINGS: BRAIN: There is no acute infarct, acute hemorrhage, hydrocephalus or extra-axial collection. The midline structures are normal. No midline shift or other mass effect. Diffuse confluent hyperintense T2-weighted signal within the periventricular, deep and juxtacortical white matter, most commonly due to chronic ischemic microangiopathy. Generalized atrophy without lobar predilection. Susceptibility-sensitive sequences show no chronic microhemorrhage or superficial siderosis. VASCULAR: Major intracranial arterial and venous sinus flow voids are normal. SKULL AND UPPER CERVICAL SPINE: Calvarial bone marrow signal is normal. There is  no skull base mass. Visualized upper cervical spine and soft tissues are normal. SINUSES/ORBITS: Small amount of bilateral mastoid fluid. Paranasal sinuses are clear. There are bilateral lens replacements. IMPRESSION: 1. No acute intracranial abnormality. 2. Advanced atrophy and chronic ischemic microangiopathic changes. Electronically Signed   By: Ulyses Jarred M.D.   On: 08/14/2018 02:52   Ct Head Code Stroke Wo Contrast  Result Date: 08/13/2018 CLINICAL DATA:  Code stroke.  Slurred speech ataxia EXAM: CT HEAD WITHOUT CONTRAST TECHNIQUE: Contiguous axial images were obtained from the base of the skull through the vertex without intravenous contrast. COMPARISON:  MRI 05/20/2017 FINDINGS: Brain: Moderate atrophy. Moderate chronic microvascular ischemic change in the white matter. Negative for acute infarct, hemorrhage, or mass. Vascular: Negative for hyperdense vessel Skull: Negative Sinuses/Orbits: Negative Other: None ASPECTS ([place] Stroke Program Early CT Score) - Ganglionic level infarction (caudate, lentiform nuclei, internal capsule, insula, M1-M3  cortex): 7 - Supraganglionic infarction (M4-M6 cortex): 3 Total score (0-10 with 10 being normal): 10 IMPRESSION: 1. No acute intracranial abnormality. 2. ASPECTS is 10 3. Moderate atrophy and moderate chronic microvascular ischemic change in the white matter. 4. Electronically Signed   By: Franchot Gallo M.D.   On: 08/13/2018 18:53     ASSESSMENT AND PLAN   84 y.o. male past medical history of coronary artery disease, CAD, hyperlipidemia, vertebroplasty admitted at [facility] long for worsening back pain.  Patient stroke alerted after the nurse noted patient to have right-sided weakness, slurred speech and confusion.  Did not receive IV TPA as patient was on Lovenox.  MRI brain is negative for any acute infarct.  Possible TIA vs Weakness/Limitation movement from back pain   Recommend #Transthoracic Echo  #Start or continue Atorvastatin 40 mg/other high intensity statin #Vascular surgery consult not needed, he only has 40% stenosis in the left carotid which is not significant, and is also a poor candidate for surgery. # ASA and Plavix for 3 weeks  # BP goal: permissive HTN upto 185/110 mmHg # HBAIC and Lipid profile # Telemetry monitoring # Frequent neuro checks # NPO until passes stroke swallow screen  Please page stroke NP  Or  PA  Or MD from 8am -4 pm  as this patient from this time will be  followed by the stroke.   You can look them up on [email or web address]  Password [facility]    Katyra Tomassetti [facility] Pager Number [phone]

## 2018-08-15 ENCOUNTER — Encounter (HOSPITAL_COMMUNITY): Payer: Self-pay

## 2018-08-15 DIAGNOSIS — K5730 Diverticulosis of large intestine without perforation or abscess without bleeding: Secondary | ICD-10-CM | POA: Diagnosis present

## 2018-08-15 DIAGNOSIS — H9190 Unspecified hearing loss, unspecified ear: Secondary | ICD-10-CM | POA: Diagnosis present

## 2018-08-15 DIAGNOSIS — I129 Hypertensive chronic kidney disease with stage 1 through stage 4 chronic kidney disease, or unspecified chronic kidney disease: Secondary | ICD-10-CM | POA: Diagnosis present

## 2018-08-15 DIAGNOSIS — M8008XG Age-related osteoporosis with current pathological fracture, vertebra(e), subsequent encounter for fracture with delayed healing: Secondary | ICD-10-CM | POA: Diagnosis not present

## 2018-08-15 DIAGNOSIS — J181 Lobar pneumonia, unspecified organism: Secondary | ICD-10-CM

## 2018-08-15 DIAGNOSIS — I6523 Occlusion and stenosis of bilateral carotid arteries: Secondary | ICD-10-CM | POA: Diagnosis present

## 2018-08-15 DIAGNOSIS — E440 Moderate protein-calorie malnutrition: Secondary | ICD-10-CM | POA: Diagnosis present

## 2018-08-15 DIAGNOSIS — M546 Pain in thoracic spine: Secondary | ICD-10-CM | POA: Diagnosis not present

## 2018-08-15 DIAGNOSIS — E861 Hypovolemia: Secondary | ICD-10-CM | POA: Diagnosis present

## 2018-08-15 DIAGNOSIS — E871 Hypo-osmolality and hyponatremia: Secondary | ICD-10-CM | POA: Diagnosis present

## 2018-08-15 DIAGNOSIS — R262 Difficulty in walking, not elsewhere classified: Secondary | ICD-10-CM | POA: Diagnosis present

## 2018-08-15 DIAGNOSIS — I513 Intracardiac thrombosis, not elsewhere classified: Secondary | ICD-10-CM | POA: Diagnosis present

## 2018-08-15 DIAGNOSIS — M8008XA Age-related osteoporosis with current pathological fracture, vertebra(e), initial encounter for fracture: Secondary | ICD-10-CM | POA: Diagnosis present

## 2018-08-15 DIAGNOSIS — R32 Unspecified urinary incontinence: Secondary | ICD-10-CM | POA: Diagnosis present

## 2018-08-15 DIAGNOSIS — F05 Delirium due to known physiological condition: Secondary | ICD-10-CM | POA: Diagnosis present

## 2018-08-15 DIAGNOSIS — I700 Atherosclerosis of aorta: Secondary | ICD-10-CM | POA: Diagnosis present

## 2018-08-15 DIAGNOSIS — L8990 Pressure ulcer of unspecified site, unspecified stage: Secondary | ICD-10-CM | POA: Diagnosis present

## 2018-08-15 DIAGNOSIS — E875 Hyperkalemia: Secondary | ICD-10-CM | POA: Diagnosis present

## 2018-08-15 DIAGNOSIS — J189 Pneumonia, unspecified organism: Secondary | ICD-10-CM | POA: Diagnosis not present

## 2018-08-15 DIAGNOSIS — H409 Unspecified glaucoma: Secondary | ICD-10-CM | POA: Diagnosis present

## 2018-08-15 DIAGNOSIS — I7389 Other specified peripheral vascular diseases: Secondary | ICD-10-CM | POA: Diagnosis present

## 2018-08-15 DIAGNOSIS — R338 Other retention of urine: Secondary | ICD-10-CM | POA: Diagnosis present

## 2018-08-15 DIAGNOSIS — G9341 Metabolic encephalopathy: Secondary | ICD-10-CM | POA: Diagnosis present

## 2018-08-15 DIAGNOSIS — E782 Mixed hyperlipidemia: Secondary | ICD-10-CM | POA: Diagnosis present

## 2018-08-15 DIAGNOSIS — N183 Chronic kidney disease, stage 3 (moderate): Secondary | ICD-10-CM | POA: Diagnosis present

## 2018-08-15 DIAGNOSIS — I10 Essential (primary) hypertension: Secondary | ICD-10-CM | POA: Diagnosis not present

## 2018-08-15 DIAGNOSIS — G8929 Other chronic pain: Secondary | ICD-10-CM | POA: Diagnosis present

## 2018-08-15 DIAGNOSIS — I2510 Atherosclerotic heart disease of native coronary artery without angina pectoris: Secondary | ICD-10-CM | POA: Diagnosis present

## 2018-08-15 DIAGNOSIS — N401 Enlarged prostate with lower urinary tract symptoms: Secondary | ICD-10-CM | POA: Diagnosis present

## 2018-08-15 LAB — BASIC METABOLIC PANEL
ANION GAP: 12 (ref 5–15)
BUN: 14 mg/dL (ref 8–23)
CO2: 20 mmol/L — ABNORMAL LOW (ref 22–32)
Calcium: 8.1 mg/dL — ABNORMAL LOW (ref 8.9–10.3)
Chloride: 102 mmol/L (ref 98–111)
Creatinine, Ser: 0.9 mg/dL (ref 0.61–1.24)
Glucose, Bld: 88 mg/dL (ref 70–99)
Potassium: 3.4 mmol/L — ABNORMAL LOW (ref 3.5–5.1)
SODIUM: 134 mmol/L — AB (ref 135–145)

## 2018-08-15 LAB — OSMOLALITY, URINE: Osmolality, Ur: 504 mOsm/kg (ref 300–900)

## 2018-08-15 LAB — SODIUM, URINE, RANDOM: SODIUM UR: 132 mmol/L

## 2018-08-15 MED ORDER — SODIUM CHLORIDE 0.9 % IV SOLN
1.0000 g | Freq: Once | INTRAVENOUS | Status: AC
  Administered 2018-08-15: 1 g via INTRAVENOUS
  Filled 2018-08-15: qty 10

## 2018-08-15 MED ORDER — QUETIAPINE FUMARATE 25 MG PO TABS
25.0000 mg | ORAL_TABLET | Freq: Every day | ORAL | Status: DC
  Administered 2018-08-15: 25 mg via ORAL
  Filled 2018-08-15: qty 1

## 2018-08-15 MED ORDER — DEXTROSE 5 % IV SOLN
250.0000 mg | INTRAVENOUS | Status: DC
  Filled 2018-08-15: qty 250

## 2018-08-15 MED ORDER — SODIUM CHLORIDE 0.9 % IV SOLN
500.0000 mg | Freq: Once | INTRAVENOUS | Status: AC
  Administered 2018-08-15: 500 mg via INTRAVENOUS
  Filled 2018-08-15: qty 500

## 2018-08-15 MED ORDER — DEXTROSE 5 % IV SOLN: 250.0000 mg | INTRAVENOUS | Status: DC

## 2018-08-15 MED ORDER — SODIUM CHLORIDE 0.9 % IV SOLN
1.0000 g | INTRAVENOUS | Status: AC
  Administered 2018-08-15 – 2018-08-18 (×4): 1 g via INTRAVENOUS
  Filled 2018-08-15 (×4): qty 10

## 2018-08-15 MED ORDER — SODIUM CHLORIDE 0.9 % IV SOLN: 500.0000 mg | INTRAVENOUS | Status: DC

## 2018-08-15 NOTE — Progress Notes (Signed)
Physical Therapy Treatment Patient Details Name: Troy Ray MRN: [id number] DOB: 1928/05/31 Today's Date: 08/15/2018    History of Present Illness 84 yo male admitted with severe back pain. Hx of comp fx-s/p thoracic KP, CAD, CKD, hearing loss.     PT Comments    Pt performing mobility with increased assistance he is unable to tolerate gait at this time due to regression of mobility,  He continues to benefit from skilled rehab in a post acute setting to improve strength and function.  Family remains concerned with patient's cognition.     Follow Up Recommendations  SNF     Equipment Recommendations  None recommended by PT    Recommendations for Other Services       Precautions / Restrictions Precautions Precautions: Fall Restrictions Weight Bearing Restrictions: No    Mobility  Bed Mobility Overal bed mobility: Needs Assistance Bed Mobility: Rolling;Sidelying to Sit;Sit to Supine Rolling: Mod assist Sidelying to sit: Max assist       General bed mobility comments: Cues for hand placement, assistance to roll hips, advance LEs to edge of bed and elevate trunk into sitting.    Transfers Overall transfer level: Needs assistance Equipment used: Ambulation equipment used(sara stedy used for sit to stand during pericare.  ) Transfers: Sit to/from [employer] Sit to Stand: Mod assist;+2 safety/equipment(sit to stand in sara stedy)   Squat pivot transfers: Max assist(no device used for squat pivot performed from higher surface of bed to lower commode.  )     General transfer comment: Pt with flexed posture and lacks full hip extension. Posterior lean noted.  Cues for hand placement on sara cross bar to achieve standing.  Pt with short standing trials.    Ambulation/Gait Ambulation/Gait assistance: (NT patients regressing and requred increased assistance with standing.  )               Stairs             Wheelchair Mobility     Modified Rankin (Stroke Patients Only)       Balance Overall balance assessment: Needs assistance;History of Falls   Sitting balance-Leahy Scale: Poor Sitting balance - Comments: posterior bias, with posterior pelvic tilt.       Standing balance-Leahy Scale: Poor Standing balance comment: posterior lean                            Cognition Arousal/Alertness: Awake/alert Behavior During Therapy: WFL for tasks assessed/performed Overall Cognitive Status: Difficult to assess                                 General Comments: Pt reports seeing water dripping from window despite no being there, patient also attempting to feed himself after session with imaginary food.        Exercises      General Comments        Pertinent Vitals/Pain Pain Assessment: Faces Faces Pain Scale: Hurts even more Pain Location: back Pain Descriptors / Indicators: Aching;Sore;Discomfort;Moaning Pain Intervention(s): Monitored during session;Repositioned    Home Living                      Prior Function            PT Goals (current goals can now be found in the care plan section) Acute Rehab PT Goals Patient Stated Goal:  pain control. to get stronger. Potential to Achieve Goals: Good Progress towards PT goals: Progressing toward goals    Frequency    Min 2X/week      PT Plan Current plan remains appropriate;Frequency needs to be updated    Co-evaluation              AM-PAC PT "6 Clicks" Daily Activity  Outcome Measure  Difficulty turning over in bed (including adjusting bedclothes, sheets and blankets)?: Unable Difficulty moving from lying on back to sitting on the side of the bed? : Unable Difficulty sitting down on and standing up from a chair with arms (e.g., wheelchair, bedside commode, etc,.)?: Unable Help needed moving to and from a bed to chair (including a wheelchair)?: A Lot Help needed walking in hospital room?: Total Help  needed climbing 3-5 steps with a railing? : Total 6 Click Score: 7    End of Session Equipment Utilized During Treatment: Gait belt Activity Tolerance: Patient tolerated treatment well Patient left: in bed;with call bell/phone within reach;with bed alarm set;with family/visitor present Nurse Communication: Mobility status PT Visit Diagnosis: Muscle weakness (generalized) (M62.81);Difficulty in walking, not elsewhere classified (R26.2);Pain;Unsteadiness on feet (R26.81);History of falling (Z91.81) Pain - part of body: (back)     Time: 5521-7471 PT Time Calculation (min) (ACUTE ONLY): 43 min  Charges:  $Therapeutic Activity: 38-52 mins                     [name], PTA Acute Rehabilitation Services Pager [phone] Office [phone]     Jackilyn Umphlett Eli Hose 08/15/2018, 1:30 PM

## 2018-08-15 NOTE — Progress Notes (Signed)
VAST to bedside to assess for PIV. Pt's arms are extremely bruised with fragile skin. Family at bedside.  Family verbalized dissatisfaction with pt's treatment and lack of communication regarding pt. RN apologized for issues they have been having and asked if they were aware of the treatment plan for pt. 2 sons and wife stated they were not aware of plan for pt. VAST RN explained that no IV would be placed by this RN d/t lack of visualization of suitable veins; further explained one of my colleagues would come assess pt and could utilize [place] if necessary. RN explained process to family and advised it might be an hour or two before access is placed. Family was appreciative of communication efforts. While in room, VAST RN noted pt appeared to be pause breathing, but his sats and HR did not drop with episodes. Asked pt's wife if pt had sleep apnea or breathed like that at home; she stated no. Also asked her if pt would want to be resuscitated if he were to arrest or stop breathing. She stated he would not want that. VAST RN explained DNR paper work would need to be filled out and a bracelet would need to be placed on pt. Family voiced understanding. VAST RN advised she would pass information along to unit RN. VAST RN spoke with unit RN and explained situation with IV access as well as conversations with family regarding pause breathing and DNR needing paperwork/processing.

## 2018-08-15 NOTE — Progress Notes (Signed)
Family appeared frustrated from care given so far. Family asked if someone could be assigned to stay with patient when up in chair. Family is concerned patient is not receiving care needed to improve. Acknowledged family's concerns. Asked NT Lattie Haw to make sure patient is a feeder from now on. Family asked if possible to be notified when patient up in chair so family or friends may keep an eye on patient as well. Assistant Director Gwyndolyn Saxon notified of situation and stated will discuss with family.

## 2018-08-15 NOTE — Progress Notes (Signed)
Patient confused, patient refusing to have labs drawn this morning. Patient becomes agitated when lights are on in the room however calms down when lights are turned off.  Patient keeps stating to just leave him alone.

## 2018-08-15 NOTE — Progress Notes (Signed)
PROGRESS NOTE  Troy Ray DVV:[id number] DOB: 1928/03/08 DOA: 08/08/2018 PCP: [name], MD   LOS: 0 days   Brief Narrative / Interim history: 84 y.o.malewith medical history significant ofCAD, stage 3 CKD, hypertension, prior T4 , T5 vertebroplasty, BPH, diverticulosis and  hyperlipidemia initially admitted to [facility] from [facility] with worsening back pain for 1 months.  Patient had extensive work up with CT angio of the chest , abdomen and pelvis, but no acute changes in the vertebra or new fractures evident.  Patient developed agitation about 5 PM and was given IV Haldol 2 mg once.  Then he developed slurred speech, weakness, lethargy and confusion about 3 hours later that raise concern for CVA.  Patient on Lovenox not a TPA candidate.  Tele-neuro ordered.  CT head, CTA head and neck obtained and did not reveal acute finding to explain his symptoms.  Loaded with Plavix 300 mg.  Transferred to [facility]. MRI brain without acute finding to explain his symptoms this morning.  Echocardiogram basically normal.  Chest x-ray showed RLL infiltrate.  Started on ceftriaxone and azithromycin 10/10.  Subjective: No major events overnight.  Slight improvement in his mental status.  He is oriented to self.  He thinks he is at [facility].  Started on ceftriaxone and azithromycin for RLL infiltrate concerning for pneumonia.  Assessment & Plan: Active Problems:   Hyperlipidemia, mixed   Essential hypertension   Coronary atherosclerosis   BPH (benign prostatic hyperplasia)   Leukocytosis   Vertebral fracture, osteoporotic (HCC)   Back pain   Pressure injury of skin  Acute encephalopathy: Slightly improved.  Likely iatrogenic based on temporal association.  Now with RLL infiltrate concerning for pneumonia on chest x-ray.  Started after Haldol injection.  MRI brain not impressive.  Also with mild hyponatremia to 130 but doubt this to be the cause.  TSH normal.  History of memory loss  in his chart but family denies. -Neurology signed off 10/9 -Avoid sedating medications.  Discontinued Zanaflex. -Delirium precautions -PT/OT/SLP  Coronary care acquired pneumonia: CXR with RLL infiltrate.  No signs of respiratory distress.  He is on room air. -Ceftriaxone and azithromycin 10/10-> -Incentive spirometry  Back pain: Appears chronic.  Likely due to old fracture of T4 and T5.  Status post vertebroplasty the past. -Scheduled Tylenol -PRN Toradol -PT OT when mentally  Hypertension: SBP as high as 220.  DBP as high as 138.  Improved. -On normal saline at 100 cc/h for hyponatremia.   History of AAA: Benefit from normalizing BP  CKD 2: Stable. -Check renal function  CAD: ?history of CABG. Does not appear to have cardiopulmonary symptoms. -Check EKG and troponin x1  BPH: Foley in place and draining.  Mural thrombus/plaque descending thoracic aorta -Likely outpatient follow-up.  Scheduled Meds: . aspirin EC  81 mg Oral QHS  . atorvastatin  40 mg Oral Daily  . docusate sodium  200 mg Oral QHS  . enoxaparin (LOVENOX) injection  40 mg Subcutaneous Q24H  . finasteride  5 mg Oral Daily  . latanoprost  1 drop Both Eyes QHS  . losartan  50 mg Oral BID  . metoprolol succinate  50 mg Oral q morning - 10a  . pantoprazole  40 mg Oral Daily  . potassium chloride  40 mEq Oral BID  . timolol  1 drop Both Eyes Daily   Continuous Infusions: . sodium chloride Stopped (08/15/18 1326)  . [START ON 08/16/2018] azithromycin    . [START ON  08/16/2018] cefTRIAXone (ROCEPHIN)  IV     PRN Meds:.hydrALAZINE, traMADol  DVT prophylaxis: Lovenox Code Status: Full code Family Communication: Updated family (son and daughter) over the phone about the above plan Disposition Plan: Continue stepdown  Consultants:   Neurology  Procedures:   None  Antimicrobials:  None  Objective: Vitals:   08/14/18 2334 08/15/18 0349 08/15/18 0829 08/15/18 1229  BP: (!) 187/58 (!) 151/41 (!)  172/75 (!) 141/60  Pulse: 69 79 70 65  Resp: 15 13 (!) 21 (!) 23  Temp: 98 F (36.7 C) 98.1 F (36.7 C) 98.1 F (36.7 C) 97.9 F (36.6 C)  TempSrc: Axillary Oral Axillary Oral  SpO2: 93% 93% 96% 96%  Weight:      Height:        Intake/Output Summary (Last 24 hours) at 08/15/2018 1424 Last data filed at 08/15/2018 1356 Gross per 24 hour  Intake 2402.04 ml  Output -  Net 2402.04 ml   Filed Weights   08/08/18 2327 08/08/18 2328 08/13/18 2000  Weight: 64.4 kg 64.8 kg 68.9 kg    Examination:  GENERAL: Awake but confused. Oriented to self.  HEENT: PERRLA, MMM.  Mild left facial droop. NECK: Supple.  No rigidity. LUNGS:  No IWOB, good air movement, CTAB HEART:  RRR with 2/6 SEM ABD:  soft, NT with active BS EXT: No edema.  No rigidity. NEURO: Awake but confused .  Oriented to self.  He thinks he is at [facility].  Slurred speech.  Tongue protrusion midline.  Minimal left facial droop.  Symmetric reflex. Decerebrate posturing in LE.  PERRLA.  Further neuro exam limited due to patient's inability to cooperate. PSYCH: Confused and disoriented   Data Reviewed: I have independently reviewed following labs and imaging studies.  CBC: Recent Labs  Lab 08/14/18 1258  WBC 13.2*  NEUTROABS 10.1*  HGB 10.9*  HCT 33.0*  MCV 87.3  PLT 629   Basic Metabolic Panel: Recent Labs  Lab 08/14/18 1258  NA 130*  K 3.3*  CL 103  CO2 19*  GLUCOSE 113*  BUN 11  CREATININE 0.94  CALCIUM 8.0*   GFR: Estimated Creatinine Clearance: 52.9 mL/min (by C-G formula based on SCr of 0.94 mg/dL). Liver Function Tests: No results for input(s): AST, ALT, ALKPHOS, BILITOT, PROT, ALBUMIN in the last 168 hours. No results for input(s): LIPASE, AMYLASE in the last 168 hours. No results for input(s): AMMONIA in the last 168 hours. Coagulation Profile: No results for input(s): INR, PROTIME in the last 168 hours. Cardiac Enzymes: No results for input(s): CKTOTAL, CKMB, CKMBINDEX,  TROPONINI in the last 168 hours. BNP (last 3 results) No results for input(s): PROBNP in the last 8760 hours. HbA1C: Recent Labs    08/14/18 1008  HGBA1C 5.7*   CBG: Recent Labs  Lab 08/13/18 1828  GLUCAP 134*   Lipid Profile: Recent Labs    08/14/18 1008  CHOL 115  HDL 35*  LDLCALC 66  TRIG 69  CHOLHDL 3.3   Thyroid Function Tests: Recent Labs    08/14/18 1258  TSH 3.513   Anemia Panel: No results for input(s): VITAMINB12, FOLATE, FERRITIN, TIBC, IRON, RETICCTPCT in the last 72 hours. Urine analysis:    Component Value Date/Time   COLORURINE YELLOW 08/14/2018 1904   APPEARANCEUR CLEAR 08/14/2018 1904   LABSPEC 1.029 08/14/2018 1904   PHURINE 6.0 08/14/2018 1904   GLUCOSEU NEGATIVE 08/14/2018 1904   HGBUR NEGATIVE 08/14/2018 1904   HGBUR small 07/04/2010 [place]  NEGATIVE 08/14/2018 1904   KETONESUR 5 (A) 08/14/2018 1904   PROTEINUR NEGATIVE 08/14/2018 1904   UROBILINOGEN 0.2 08/20/2014 1056   NITRITE NEGATIVE 08/14/2018 1904   LEUKOCYTESUR NEGATIVE 08/14/2018 1904   Sepsis Labs: Invalid input(s): PROCALCITONIN, LACTICIDVEN  No results found for this or any previous visit (from the past 240 hour(s)).    Radiology Studies: Dg Chest 2 View  Result Date: 08/14/2018 CLINICAL DATA:  Cough EXAM: CHEST - 2 VIEW COMPARISON:  08/08/2018 chest radiograph. FINDINGS: Intact sternotomy wires. Stable cardiomediastinal silhouette with normal heart size. No pneumothorax. Left lower lobe consolidation with small left pleural effusion. No right pleural effusion. IMPRESSION: 1. Left lower lobe consolidation suggesting pneumonia. Follow-up chest radiographs recommended to document resolution. 2. Small left pleural effusion. Electronically Signed   By: Ilona Sorrel M.D.   On: 08/14/2018 18:21    Matheus Spiker T. [facility] Pager [phone]  If 7PM-7AM, please contact night-coverage [email or web address] Password [email or web address] 08/15/2018, 2:24 PM

## 2018-08-16 LAB — BASIC METABOLIC PANEL
Anion gap: 13 (ref 5–15)
BUN: 13 mg/dL (ref 8–23)
CHLORIDE: 104 mmol/L (ref 98–111)
CO2: 18 mmol/L — ABNORMAL LOW (ref 22–32)
CREATININE: 0.81 mg/dL (ref 0.61–1.24)
Calcium: 8.2 mg/dL — ABNORMAL LOW (ref 8.9–10.3)
GFR calc Af Amer: >60 mL/min (ref >60)
GLUCOSE: 94 mg/dL (ref 70–99)
POTASSIUM: 3.8 mmol/L (ref 3.5–5.1)
SODIUM: 135 mmol/L (ref 135–145)

## 2018-08-16 LAB — CBC
HEMATOCRIT: 40 % (ref 39.0–52.0)
HEMOGLOBIN: 12.9 g/dL — AB (ref 13.0–17.0)
MCH: 28.3 pg (ref 26.0–34.0)
MCHC: 32.3 g/dL (ref 30.0–36.0)
MCV: 87.7 fL (ref 80.0–100.0)
Platelets: 226 10*3/uL (ref 150–400)
RBC: 4.56 MIL/uL (ref 4.22–5.81)
RDW: 15.1 % (ref 11.5–15.5)
WBC: 17.5 10*3/uL — AB (ref 4.0–10.5)
nRBC: 0 % (ref 0.0–0.2)

## 2018-08-16 LAB — PROCALCITONIN: Procalcitonin: 0.1 ng/mL

## 2018-08-16 MED ORDER — ENSURE ENLIVE PO LIQD
237.0000 mL | Freq: Three times a day (TID) | ORAL | Status: DC
  Administered 2018-08-16 – 2018-08-23 (×18): 237 mL via ORAL

## 2018-08-16 MED ORDER — AZITHROMYCIN 250 MG PO TABS
250.0000 mg | ORAL_TABLET | Freq: Every day | ORAL | Status: AC
  Administered 2018-08-16 – 2018-08-19 (×4): 250 mg via ORAL
  Filled 2018-08-16 (×4): qty 1

## 2018-08-16 MED ORDER — SODIUM CHLORIDE 0.9 % IV SOLN
INTRAVENOUS | Status: DC
  Administered 2018-08-17 – 2018-08-18 (×2): via INTRAVENOUS

## 2018-08-16 MED ORDER — BETHANECHOL CHLORIDE 10 MG PO TABS
10.0000 mg | ORAL_TABLET | Freq: Three times a day (TID) | ORAL | Status: AC
  Administered 2018-08-17 – 2018-08-20 (×12): 10 mg via ORAL
  Filled 2018-08-16 (×14): qty 1

## 2018-08-16 NOTE — Progress Notes (Signed)
Initial Nutrition Assessment  DOCUMENTATION CODES:   Not applicable  INTERVENTION:  Provide Ensure Enlive po TID, each supplement provides 350 kcal and 20 grams of protein.  Encourage adequate PO intake.   NUTRITION DIAGNOSIS:   Inadequate oral intake related to decreased appetite as evidenced by meal completion < 25%.  GOAL:   Patient will meet greater than or equal to 90% of their needs  MONITOR:   PO intake, Supplement acceptance, Labs, Weight trends, Skin, I & O's  REASON FOR ASSESSMENT:   Malnutrition Screening Tool    ASSESSMENT:   84 y.o. male with medical history significant of CAD, stage 3 CKD, hypertension, prior T4 , T5 vertebroplasty, BPH, diverticulosis and  hyperlipidemia presents with back pain for 1 month. Patient had extensive work up with CT angio of the chest , abdomen and pelvis, but no acute changes in the vertebra or new fractures evident. Chest x-ray showed RLL infiltrate concerning for pneumonia.  Pt asleep and did not awake to RD assessment. Family at bedside reports pt was eating well PTA with usual consuming of at least 2-3 meals a day with an Ensure shake on occasion. Wife reports pt with poor po and appetite since admission. Meal completion 5-25%. Pt with no significant weight loss per weight records. RD to order ensure to aid in caloric and protein needs. Family has been encouraging pt PO intake.   Unable to complete Nutrition-Focused physical exam at this time. Family reports pt had just fallen asleep and would like him to continue to rest.   Labs and medications reviewed.   Diet Order:   Diet Order            Diet Heart Room service appropriate? Yes; Fluid consistency: Thin  Diet effective now              EDUCATION NEEDS:   Not appropriate for education at this time  Skin:  Skin Assessment: Skin Integrity Issues: Skin Integrity Issues:: Stage I Stage I: buttocks  Last BM:  10/9  Height:   Ht Readings from Last 1 Encounters:   08/13/18 5\' 9"  (1.753 m)    Weight:   Wt Readings from Last 1 Encounters:  08/13/18 68.9 kg    Ideal Body Weight:  72.7 kg  BMI:  Body mass index is 22.43 kg/m.  Estimated Nutritional Needs:   Kcal:  1700-1850  Protein:  80-90 grams  Fluid:  1.7- 1.9 L/day    Corrin Parker, MS, RD, LDN Pager # [phone] After hours/ weekend pager # [phone]

## 2018-08-16 NOTE — Progress Notes (Signed)
Pt foley inserted with 2 people (RNS) per protocol and as ordered; pt urine clear yellow returned; no foul order or drainage noted. Pt resting comfortably in bed with call light within reach. Will continue to closely monitor. Delia Heady RN

## 2018-08-16 NOTE — Progress Notes (Signed)
Pt unable to void after removal of foley. Pt bladder scanned for 242ml. MD notified and new order received for foley insertion. Will continue to closely monitor. Delia Heady RN

## 2018-08-16 NOTE — Progress Notes (Signed)
Pt foley removed; peri-care completed. 230ml emptied from foley. Pt due to void and educated to call for assistance when he needs to void or inform the bedside sitter. Will continue to closely monitor pt. Delia Heady RN

## 2018-08-16 NOTE — Progress Notes (Addendum)
[place] TEAM 1 - Stepdown/ICU TEAM  Troy Ray  KWI:[id number] DOB: Aug 17, 1928 DOA: 08/08/2018 PCP: [name], MD    Brief Narrative:  84yo M w/ a hx ofCAD, stage 3 CKD, hypertension, prior T4/T5 vertebroplasty, BPH, diverticulosis, and hyperlipidemia who was initially admitted to [facility] from [facility] with worsening back pain for 1 month. An extensive work up with CT angio of the chest , abdomen and pelvis revealed no acute changes in the vertebra or new fractures.  While an inpatient he developed agitation and was given IV Haldol 2 mg once. He then developed slurred speech, weakness, lethargy and confusion about 3 hours later that raised concern for an acute CVA. CT head and CTA head and neck obtained and did not reveal acute finding to explain his symptoms. He was transferred to [facility]. MRI brain without acute finding to explain his symptoms.  Echocardiogram basically normal. Chest x-ray showed RLL infiltrate.    Subjective: Pt is alert, but remains confused. He intermittently recognizes family. He doesn't know where he is or why he is here. He denies any complaints, but his ROS is not reliable.   Assessment & Plan:  Acute encephalopathy - acute delirium  likely iatrogenic based on temporal association - MRI brain not impressive - no clear metabolic etiology noted thus far - UA negative for infxn 10/8 and 10/9 - TSH normal - check B12, folate, RPR, ammonia   RLL infiltrate concerning for pneumonia Complete short course of Ceftriaxone and azithromycin - no convincing evidence of an active pulm infection at this time   Chronic Back pain due to old fracture of T4 and T5 - status post remote vertebroplasty   Hypertension BP acceptable today - follow w/o change   AAA - Mural thrombus/plaque descending thoracic aorta Noted on CTa chest - will need outpt f/u, though is likely beyond age he would benefit from any intervention   CKD 2 crt stable  CAD No evidence of ACS  presently   BPH Foley removed today - follow for retention   DVT prophylaxis: lovenox  Code Status: DNR - NO CODE Family Communication: spoke w/ son and daughter at bedsdie   Disposition Plan: SDU  Consultants:  None  Antimicrobials:  Azithro 10/10 > Rocephin 10/10 >  Objective: Blood pressure (!) 155/51, pulse 69, temperature 98 F (36.7 C), temperature source Axillary, resp. rate 18, height 5\' 9"  (1.753 m), weight 68.9 kg, SpO2 98 %.  Intake/Output Summary (Last 24 hours) at 08/16/2018 1444 Last data filed at 08/16/2018 1121 Gross per 24 hour  Intake 1735.56 ml  Output 300 ml  Net 1435.56 ml   Filed Weights   08/08/18 2327 08/08/18 2328 08/13/18 2000  Weight: 64.4 kg 64.8 kg 68.9 kg    Examination: General: No acute respiratory distress Lungs: Clear to auscultation bilaterally without wheezes or crackles Cardiovascular: Regular rate and rhythm without murmur gallop or rub normal S1 and S2 Abdomen: Nontender, nondistended, soft, bowel sounds positive, no rebound, no ascites, no appreciable mass Extremities: No significant cyanosis, clubbing, or edema bilateral lower extremities  CBC: Recent Labs  Lab 08/14/18 1258 08/16/18 0517  WBC 13.2* 17.5*  NEUTROABS 10.1*  --   HGB 10.9* 12.9*  HCT 33.0* 40.0  MCV 87.3 87.7  PLT 215 242   Basic Metabolic Panel: Recent Labs  Lab 08/14/18 1258 08/15/18 1847 08/16/18 0517  NA 130* 134* 135  K 3.3* 3.4* 3.8  CL 103 102 104  CO2 19* 20* 18*  GLUCOSE 113* 88 94  BUN 11 14 13   CREATININE 0.94 0.90 0.81  CALCIUM 8.0* 8.1* 8.2*   GFR: Estimated Creatinine Clearance: 61.4 mL/min (by C-G formula based on SCr of 0.81 mg/dL).  Liver Function Tests: No results for input(s): AST, ALT, ALKPHOS, BILITOT, PROT, ALBUMIN in the last 168 hours. No results for input(s): LIPASE, AMYLASE in the last 168 hours. No results for input(s): AMMONIA in the last 168 hours.  HbA1C: Hgb A1c MFr Bld  Date/Time Value Ref Range Status    08/14/2018 10:08 AM 5.7 (H) 4.8 - 5.6 % Final    Comment:    (NOTE) Pre diabetes:          5.7%-6.4% Diabetes:              >6.4% Glycemic control for   <7.0% adults with diabetes   12/06/2017 03:10 PM 6.2 4.6 - 6.5 % Final    Comment:    Glycemic Control Guidelines for People with Diabetes:Non Diabetic:  <6%Goal of Therapy: <7%Additional Action Suggested:  >8%     CBG: Recent Labs  Lab 08/13/18 1828  GLUCAP 134*    Scheduled Meds: . aspirin EC  81 mg Oral QHS  . atorvastatin  40 mg Oral Daily  . azithromycin  250 mg Oral Daily  . docusate sodium  200 mg Oral QHS  . enoxaparin (LOVENOX) injection  40 mg Subcutaneous Q24H  . feeding supplement (ENSURE ENLIVE)  237 mL Oral TID BM  . finasteride  5 mg Oral Daily  . latanoprost  1 drop Both Eyes QHS  . losartan  50 mg Oral BID  . metoprolol succinate  50 mg Oral q morning - 10a  . pantoprazole  40 mg Oral Daily  . potassium chloride  40 mEq Oral BID  . QUEtiapine  25 mg Oral QHS  . timolol  1 drop Both Eyes Daily   Continuous Infusions: . sodium chloride 100 mL/hr at 08/16/18 1121  . cefTRIAXone (ROCEPHIN)  IV 1 g (08/15/18 2321)     LOS: 1 day   Cherene Altes, MD [facility] Office  [phone] Pager - Text Page per Amion  If 7PM-7AM, please contact night-coverage per Amion 08/16/2018, 2:44 PM

## 2018-08-17 LAB — CBC
HEMATOCRIT: 35.9 % — AB (ref 39.0–52.0)
Hemoglobin: 11.6 g/dL — ABNORMAL LOW (ref 13.0–17.0)
MCH: 28.9 pg (ref 26.0–34.0)
MCHC: 32.3 g/dL (ref 30.0–36.0)
MCV: 89.5 fL (ref 80.0–100.0)
NRBC: 0 % (ref 0.0–0.2)
Platelets: 246 10*3/uL (ref 150–400)
RBC: 4.01 MIL/uL — AB (ref 4.22–5.81)
RDW: 15.4 % (ref 11.5–15.5)
WBC: 13.2 10*3/uL — ABNORMAL HIGH (ref 4.0–10.5)

## 2018-08-17 LAB — FERRITIN: Ferritin: 231 ng/mL (ref 24–336)

## 2018-08-17 LAB — AMMONIA: AMMONIA: 15 umol/L (ref 9–35)

## 2018-08-17 LAB — VITAMIN B12: VITAMIN B 12: 405 pg/mL (ref 180–914)

## 2018-08-17 LAB — FOLATE: FOLATE: 7.5 ng/mL (ref >5.9)

## 2018-08-17 LAB — COMPREHENSIVE METABOLIC PANEL
ALBUMIN: 2.5 g/dL — AB (ref 3.5–5.0)
ALT: 17 U/L (ref 0–44)
ANION GAP: 6 (ref 5–15)
AST: 26 U/L (ref 15–41)
Alkaline Phosphatase: 50 U/L (ref 38–126)
BUN: 11 mg/dL (ref 8–23)
CHLORIDE: 107 mmol/L (ref 98–111)
CO2: 21 mmol/L — AB (ref 22–32)
CREATININE: 0.86 mg/dL (ref 0.61–1.24)
Calcium: 8.4 mg/dL — ABNORMAL LOW (ref 8.9–10.3)
GFR calc non Af Amer: >60 mL/min (ref >60)
Glucose, Bld: 99 mg/dL (ref 70–99)
Potassium: 4.4 mmol/L (ref 3.5–5.1)
SODIUM: 134 mmol/L — AB (ref 135–145)
Total Bilirubin: 0.6 mg/dL (ref 0.3–1.2)
Total Protein: 5.5 g/dL — ABNORMAL LOW (ref 6.5–8.1)

## 2018-08-17 LAB — RETICULOCYTES
IMMATURE RETIC FRACT: 9.8 % (ref 2.3–15.9)
RBC.: 4.01 MIL/uL — ABNORMAL LOW (ref 4.22–5.81)
RETIC CT PCT: 1.5 % (ref 0.4–3.1)
Retic Count, Absolute: 59.3 10*3/uL (ref 19.0–186.0)

## 2018-08-17 LAB — IRON AND TIBC
Iron: 25 ug/dL — ABNORMAL LOW (ref 45–182)
Saturation Ratios: 14 % — ABNORMAL LOW (ref 17.9–39.5)
TIBC: 185 ug/dL — AB (ref 250–450)
UIBC: 160 ug/dL

## 2018-08-17 LAB — MAGNESIUM: Magnesium: 1.9 mg/dL (ref 1.7–2.4)

## 2018-08-17 MED ORDER — HYDRALAZINE HCL 20 MG/ML IJ SOLN: 5.0000 mg | INTRAMUSCULAR | Status: DC | PRN

## 2018-08-17 MED ORDER — AMLODIPINE BESYLATE 5 MG PO TABS: 5.0000 mg | ORAL_TABLET | Freq: Two times a day (BID) | ORAL | Status: DC

## 2018-08-17 MED ORDER — AMLODIPINE BESYLATE 10 MG PO TABS
10.0000 mg | ORAL_TABLET | Freq: Every day | ORAL | Status: DC
  Administered 2018-08-17 – 2018-08-23 (×7): 10 mg via ORAL
  Filled 2018-08-17 (×7): qty 1

## 2018-08-17 MED ORDER — BENZONATATE 100 MG PO CAPS: 200.0000 mg | ORAL_CAPSULE | Freq: Two times a day (BID) | ORAL | Status: DC | PRN

## 2018-08-17 NOTE — Progress Notes (Signed)
[place] TEAM 1 - Stepdown/ICU TEAM  BELINDA BRINGHURST  QAS:[id number] DOB: [birth year] DOA: 08/08/2018 PCP: [name], MD    Brief Narrative:  84yo M w/ a hx ofCAD, stage 3 CKD, hypertension, prior T4/T5 vertebroplasty, BPH, diverticulosis, and hyperlipidemia who was initially admitted to [facility] from [facility] with worsening back pain for 1 month. An extensive work up with CT angio of the chest , abdomen and pelvis revealed no acute changes in the vertebra or new fractures.  While an inpatient he developed agitation and was given IV Haldol 2 mg once. He then developed slurred speech, weakness, lethargy and confusion about 3 hours later that raised concern for an acute CVA. CT head and CTA head and neck obtained and did not reveal acute finding to explain his symptoms. He was transferred to [facility]. MRI brain without acute finding to explain his symptoms.  Echocardiogram basically normal. Chest x-ray showed RLL infiltrate.    Subjective: Had urinary retention after attempts to remove foley yesterday afternoon. Very somnolent today. No resp distress or uncontrolled pain. Thinks he is at home when I wake him up.   Assessment & Plan:  Acute encephalopathy - acute delirium  likely iatrogenic based on temporal association to 2mg  Haldol dose - MRI brain not impressive - no clear metabolic etiology noted thus far - UA negative for infxn 10/8 and 10/9 - TSH normal - folate, ammonia, B12 all normal - avoid sedatives as able and follow   Mild hyponatremia  Keep hydrated and follow  RLL infiltrate concerning for pneumonia Complete short course of Ceftriaxone and azithromycin - no convincing evidence of an active pulm infection at this time   Chronic Back pain due to old fracture of T4 and T5 - status post remote vertebroplasty   Hypertension BP was very elevated early this morning, but has improved w/ adjustment in medical tx - monitor trend   AAA - Mural thrombus/plaque descending  thoracic aorta Noted on CTa chest - will need outpt f/u, though is likely beyond age he would benefit from any intervention   CKD 2 crt stable  CAD No evidence of ACS presently   BPH Foley removed today - follow for retention   DVT prophylaxis: lovenox  Code Status: DNR - NO CODE Family Communication: no family present at time of exam today    Disposition Plan: SDU  Consultants:  None  Antimicrobials:  Azithro 10/10 > Rocephin 10/10 >  Objective: Blood pressure (!) 152/69, pulse 70, temperature 97.8 F (36.6 C), temperature source Oral, resp. rate 19, height 5\' 9"  (1.753 m), weight 68.9 kg, SpO2 97 %.  Intake/Output Summary (Last 24 hours) at 08/17/2018 1533 Last data filed at 08/17/2018 1446 Gross per 24 hour  Intake 1530.69 ml  Output 1250 ml  Net 280.69 ml   Filed Weights   08/08/18 2327 08/08/18 2328 08/13/18 2000  Weight: 64.4 kg 64.8 kg 68.9 kg    Examination: General: No acute respiratory distress - somnolent  Lungs: CTA B  Cardiovascular: RRR - no M Abdomen: NT/ND, soft, BS+, no mass  Extremities: No signif edema B LE   CBC: Recent Labs  Lab 08/14/18 1258 08/16/18 0517 08/17/18 0354  WBC 13.2* 17.5* 13.2*  NEUTROABS 10.1*  --   --   HGB 10.9* 12.9* 11.6*  HCT 33.0* 40.0 35.9*  MCV 87.3 87.7 89.5  PLT 215 226 798   Basic Metabolic Panel: Recent Labs  Lab 08/15/18 1847 08/16/18 0517 08/17/18 0354  NA  134* 135 134*  K 3.4* 3.8 4.4  CL 102 104 107  CO2 20* 18* 21*  GLUCOSE 88 94 99  BUN 14 13 11   CREATININE 0.90 0.81 0.86  CALCIUM 8.1* 8.2* 8.4*  MG  --   --  1.9   GFR: Estimated Creatinine Clearance: 57.9 mL/min (by C-G formula based on SCr of 0.86 mg/dL).  Liver Function Tests: Recent Labs  Lab 08/17/18 0354  AST 26  ALT 17  ALKPHOS 50  BILITOT 0.6  PROT 5.5*  ALBUMIN 2.5*    Recent Labs  Lab 08/17/18 0413  AMMONIA 15    HbA1C: Hgb A1c MFr Bld  Date/Time Value Ref Range Status  08/14/2018 10:08 AM 5.7 (H) 4.8 -  5.6 % Final    Comment:    (NOTE) Pre diabetes:          5.7%-6.4% Diabetes:              >6.4% Glycemic control for   <7.0% adults with diabetes   12/06/2017 03:10 PM 6.2 4.6 - 6.5 % Final    Comment:    Glycemic Control Guidelines for People with Diabetes:Non Diabetic:  <6%Goal of Therapy: <7%Additional Action Suggested:  >8%     CBG: Recent Labs  Lab 08/13/18 1828  GLUCAP 134*    Scheduled Meds: . amLODipine  10 mg Oral Daily  . aspirin EC  81 mg Oral QHS  . atorvastatin  40 mg Oral Daily  . azithromycin  250 mg Oral Daily  . bethanechol  10 mg Oral TID  . docusate sodium  200 mg Oral QHS  . feeding supplement (ENSURE ENLIVE)  237 mL Oral TID BM  . finasteride  5 mg Oral Daily  . latanoprost  1 drop Both Eyes QHS  . losartan  50 mg Oral BID  . metoprolol succinate  50 mg Oral q morning - 10a  . pantoprazole  40 mg Oral Daily  . potassium chloride  40 mEq Oral BID  . timolol  1 drop Both Eyes Daily   Continuous Infusions: . sodium chloride 50 mL/hr at 08/17/18 0731  . cefTRIAXone (ROCEPHIN)  IV 1 g (08/16/18 2325)     LOS: 2 days   Cherene Altes, MD [facility] Office  [phone] Pager - Text Page per Amion  If 7PM-7AM, please contact night-coverage per Amion 08/17/2018, 3:33 PM

## 2018-08-17 NOTE — Progress Notes (Signed)
CSW following for discharge. Patient's authorization for SNF ran out yesterday, will need to initiate new authorization request on Monday if patient is stable (insurance offices closed over the weekend). MD aware. CSW met with family at bedside to introduce self and update that [facility] is still following to have a bed for the patient when he is medically stable. Patient's family appreciative of information.  CSW to follow.  Laveda Abbe, [place] Clinical Social Worker [phone]

## 2018-08-18 LAB — COMPREHENSIVE METABOLIC PANEL
ALBUMIN: 2.5 g/dL — AB (ref 3.5–5.0)
ALK PHOS: 48 U/L (ref 38–126)
ALT: 15 U/L (ref 0–44)
AST: 23 U/L (ref 15–41)
Anion gap: 9 (ref 5–15)
BUN: 14 mg/dL (ref 8–23)
CO2: 21 mmol/L — AB (ref 22–32)
CREATININE: 0.89 mg/dL (ref 0.61–1.24)
Calcium: 8.3 mg/dL — ABNORMAL LOW (ref 8.9–10.3)
Chloride: 103 mmol/L (ref 98–111)
GFR calc Af Amer: >60 mL/min (ref >60)
GFR calc non Af Amer: >60 mL/min (ref >60)
GLUCOSE: 105 mg/dL — AB (ref 70–99)
Potassium: 4.5 mmol/L (ref 3.5–5.1)
SODIUM: 133 mmol/L — AB (ref 135–145)
Total Bilirubin: 0.7 mg/dL (ref 0.3–1.2)
Total Protein: 5.5 g/dL — ABNORMAL LOW (ref 6.5–8.1)

## 2018-08-18 LAB — RPR: RPR Ser Ql: NONREACTIVE

## 2018-08-18 MED ORDER — CARVEDILOL 12.5 MG PO TABS
12.5000 mg | ORAL_TABLET | Freq: Two times a day (BID) | ORAL | Status: DC
  Administered 2018-08-18 – 2018-08-23 (×10): 12.5 mg via ORAL
  Filled 2018-08-18 (×11): qty 1

## 2018-08-18 NOTE — Progress Notes (Signed)
[place] TEAM 1 - Stepdown/ICU TEAM  Troy Ray  FTD:[id number] DOB: [birth year] DOA: 08/08/2018 PCP: [name], MD    Brief Narrative:  84yo M w/ a hx ofCAD, stage 3 CKD, HTN, prior T4/T5 vertebroplasty, BPH, diverticulosis, and HLD who was initially admitted to [facility] from [facility] with worsening back pain for 1 month. An extensive work up with CT angio of the chest abdomen and pelvis revealed no acute changes in the vertebra or new fractures.  While an inpatient he developed agitation and was given IV Haldol 2 mg once. He then developed slurred speech, weakness, lethargy and confusion about 3 hours later that raised concern for an acute CVA. CT head and CTA head and neck obtained and did not reveal acute finding to explain his symptoms. He was transferred to [facility]. MRI brain without acute finding to explain his symptoms.  Echocardiogram basically normal. Chest x-ray showed RLL infiltrate.    Subjective: More alert and conversant today, but remains confused. Denies sob, n/v, or abdom pain. No evidence of resp distress.   Assessment & Plan:  Acute encephalopathy - acute delirium  likely iatrogenic based on temporal association to 2mg  Haldol dose + sundowning/inpatient delirium - MRI brain not impressive except for small vessel related atrophy - no clear metabolic etiology noted - UA negative for infxn 10/8 and 10/9 - TSH normal - folate, ammonia, B12 all normal - avoid sedatives as able and cont to follow   Mild hyponatremia  Na is stable   RLL infiltrate concerning for pneumonia Complete short course of Ceftriaxone and azithromycin - no convincing evidence of an active pulm infection at this time   Chronic Back pain due to old fracture of T4 and T5 - status post remote vertebroplasty - will need a rehab stay after d/c    Hypertension BP remains quite labile - cont to titrate meds, but do so slowly in order to avoid overcorrection in this elderly pt  AAA - Mural  thrombus/plaque descending thoracic aorta Noted on CTa chest - will need outpt f/u, though is likely beyond age he would benefit from any intervention   CKD 2 crt stable  CAD No evidence of ACS presently   BPH   DVT prophylaxis: lovenox  Code Status: DNR - NO CODE Family Communication: spoke w/ wife and daughter at bedside at length  Disposition Plan: SDU  Consultants:  None  Antimicrobials:  Azithro 10/10 > Rocephin 10/10 >  Objective: Blood pressure (!) 181/83, pulse 65, temperature 98.7 F (37.1 C), temperature source Oral, resp. rate (!) 23, height 5\' 9"  (1.753 m), weight 68.9 kg, SpO2 97 %.  Intake/Output Summary (Last 24 hours) at 08/18/2018 1047 Last data filed at 08/18/2018 0811 Gross per 24 hour  Intake 860 ml  Output 2600 ml  Net -1740 ml   Filed Weights   08/08/18 2327 08/08/18 2328 08/13/18 2000  Weight: 64.4 kg 64.8 kg 68.9 kg    Examination: General: No acute respiratory distress - more alert   Lungs: CTA B w/o wheezing  Cardiovascular: RRR w/o M or rub  Abdomen: NT/ND, soft, BS+, no mass  Extremities: No edema B LE   CBC: Recent Labs  Lab 08/14/18 1258 08/16/18 0517 08/17/18 0354  WBC 13.2* 17.5* 13.2*  NEUTROABS 10.1*  --   --   HGB 10.9* 12.9* 11.6*  HCT 33.0* 40.0 35.9*  MCV 87.3 87.7 89.5  PLT 215 226 062   Basic Metabolic Panel: Recent Labs  Lab  08/16/18 0517 08/17/18 0354 08/18/18 0523  NA 135 134* 133*  K 3.8 4.4 4.5  CL 104 107 103  CO2 18* 21* 21*  GLUCOSE 94 99 105*  BUN 13 11 14   CREATININE 0.81 0.86 0.89  CALCIUM 8.2* 8.4* 8.3*  MG  --  1.9  --    GFR: Estimated Creatinine Clearance: 55.9 mL/min (by C-G formula based on SCr of 0.89 mg/dL).  Liver Function Tests: Recent Labs  Lab 08/17/18 0354 08/18/18 0523  AST 26 23  ALT 17 15  ALKPHOS 50 48  BILITOT 0.6 0.7  PROT 5.5* 5.5*  ALBUMIN 2.5* 2.5*    Recent Labs  Lab 08/17/18 0413  AMMONIA 15    HbA1C: Hgb A1c MFr Bld  Date/Time Value Ref Range  Status  08/14/2018 10:08 AM 5.7 (H) 4.8 - 5.6 % Final    Comment:    (NOTE) Pre diabetes:          5.7%-6.4% Diabetes:              >6.4% Glycemic control for   <7.0% adults with diabetes   12/06/2017 03:10 PM 6.2 4.6 - 6.5 % Final    Comment:    Glycemic Control Guidelines for People with Diabetes:Non Diabetic:  <6%Goal of Therapy: <7%Additional Action Suggested:  >8%     CBG: Recent Labs  Lab 08/13/18 1828  GLUCAP 134*    Scheduled Meds: . amLODipine  10 mg Oral Daily  . aspirin EC  81 mg Oral QHS  . atorvastatin  40 mg Oral Daily  . azithromycin  250 mg Oral Daily  . bethanechol  10 mg Oral TID  . docusate sodium  200 mg Oral QHS  . feeding supplement (ENSURE ENLIVE)  237 mL Oral TID BM  . finasteride  5 mg Oral Daily  . latanoprost  1 drop Both Eyes QHS  . losartan  50 mg Oral BID  . metoprolol succinate  50 mg Oral q morning - 10a  . pantoprazole  40 mg Oral Daily  . potassium chloride  40 mEq Oral BID  . timolol  1 drop Both Eyes Daily   Continuous Infusions: . sodium chloride 30 mL/hr at 08/17/18 1627  . cefTRIAXone (ROCEPHIN)  IV 1 g (08/18/18 0031)     LOS: 3 days   Cherene Altes, MD [facility] Office  [phone] Pager - Text Page per Amion  If 7PM-7AM, please contact night-coverage per Amion 08/18/2018, 10:47 AM

## 2018-08-18 NOTE — Plan of Care (Signed)
Patient stable, discussed POC with patient and family, agreeable with plan, denies question/concerns at this time.  

## 2018-08-19 MED ORDER — SERTRALINE HCL 50 MG PO TABS
25.0000 mg | ORAL_TABLET | Freq: Every day | ORAL | Status: DC
  Administered 2018-08-19 – 2018-08-20 (×2): 25 mg via ORAL
  Filled 2018-08-19 (×2): qty 1

## 2018-08-19 MED ORDER — OMEGA-3-ACID ETHYL ESTERS 1 G PO CAPS
2.0000 g | ORAL_CAPSULE | Freq: Two times a day (BID) | ORAL | Status: DC
  Administered 2018-08-19 – 2018-08-23 (×8): 2 g via ORAL
  Filled 2018-08-19 (×9): qty 2

## 2018-08-19 MED ORDER — AMANTADINE HCL 100 MG PO CAPS
100.0000 mg | ORAL_CAPSULE | Freq: Every day | ORAL | Status: DC
  Administered 2018-08-20: 100 mg via ORAL
  Filled 2018-08-19 (×2): qty 1

## 2018-08-19 MED ORDER — NON FORMULARY: 3.0000 mg | Freq: Every day | Status: DC

## 2018-08-19 MED ORDER — MODAFINIL 100 MG PO TABS
100.0000 mg | ORAL_TABLET | Freq: Every day | ORAL | Status: AC
  Administered 2018-08-19 – 2018-08-20 (×2): 100 mg via ORAL
  Filled 2018-08-19 (×2): qty 1

## 2018-08-19 MED ORDER — MELATONIN 3 MG PO TABS
3.0000 mg | ORAL_TABLET | Freq: Every day | ORAL | Status: DC
  Administered 2018-08-19 – 2018-08-21 (×3): 3 mg via ORAL
  Filled 2018-08-19 (×3): qty 1

## 2018-08-19 NOTE — Progress Notes (Signed)
[place] TEAM 1 - Stepdown/ICU TEAM  Troy Ray  KNL:[id number] DOB: [birth year] DOA: 08/08/2018 PCP: [name], MD    Brief Narrative:  84yo M w/ a hx ofCAD, stage 3 CKD, HTN, prior T4/T5 vertebroplasty, BPH, diverticulosis, and HLD who was initially admitted to [facility] from [facility] with worsening back pain for 1 month. An extensive work up with CT angio of the chest abdomen and pelvis revealed no acute changes in the vertebra or new fractures.  While an inpatient he developed agitation and was given IV Haldol 2 mg once. He then developed slurred speech, weakness, lethargy and confusion about 3 hours later that raised concern for an acute CVA. CT head and CTA head and neck did not reveal acute findings to explain his symptoms. He was transferred to [facility]. MRI brain without acute finding to explain his symptoms.  Echocardiogram basically normal. Chest x-ray showed a possible RLL infiltrate.    Subjective: Pt is alert, but more confused today. He knows I am a doctor, but thinks he is in someone's home. He denies complaints. The staff report that he has been somewhat agitated and has been fidgeting a lot.   Assessment & Plan:  Acute encephalopathy - acute delirium  likely iatrogenic based on temporal association to 2mg  Haldol dose + sundowning/inpatient delirium - MRI brain not impressive except for small vessel related atrophy - no clear metabolic etiology noted - UA negative for infxn 10/8 and 10/9 - TSH normal - folate, ammonia, B12 all normal - avoid sedatives as able and cont to follow - nothing else to do at this point but to give him more time to recover - will initiate protocol utilized at [facility] for acute delirium and monitor for improvement   Mild hyponatremia  Na is stable   RLL infiltrate concerning for pneumonia Will complete 5 day course of Ceftriaxone and azithromycin today - no convincing evidence of an active pulm infection at this time   Chronic Back  pain due to old fracture of T4 and T5 - status post remote vertebroplasty - will need a rehab stay after d/c - if remains clinically stable, plan is for d/c to rehab on Wednesday, even if mental status remains altered, as change of venue may assist in recovery   Hypertension BP trend overall has improved - follow w/o change in tx today - avoid overcorrection   AAA - Mural thrombus/plaque descending thoracic aorta Noted on CTa chest - will need outpt f/u, though is likely beyond age he would benefit from any intervention   CKD 2 crt stable  CAD No evidence of ACS presently   BPH   DVT prophylaxis: lovenox  Code Status: DNR - NO CODE Family Communication: spoke w/ wife in hallway   Disposition Plan: SDU - tentative plan for d/c to SNF for rehab 10/16 regardless of mental status   Consultants:  None  Antimicrobials:  Azithro 10/10 > 10/14 Rocephin 10/10 > 10/14  Objective: Blood pressure 125/67, pulse 73, temperature (!) 97.5 F (36.4 C), temperature source Oral, resp. rate 20, height 5\' 9"  (1.753 m), weight 68.9 kg, SpO2 99 %.  Intake/Output Summary (Last 24 hours) at 08/19/2018 0959 Last data filed at 08/19/2018 0646 Gross per 24 hour  Intake 480 ml  Output 2800 ml  Net -2320 ml   Filed Weights   08/08/18 2327 08/08/18 2328 08/13/18 2000  Weight: 64.4 kg 64.8 kg 68.9 kg    Examination: General: No acute respiratory distress -  alert but more confused  Lungs: CTA B - no wheeze or focal crackles  Cardiovascular: RRR w/o M or rub  Abdomen: NT/ND, soft, BS+, no mass  Extremities: No edema B LE - no cyanosis   CBC: Recent Labs  Lab 08/14/18 1258 08/16/18 0517 08/17/18 0354  WBC 13.2* 17.5* 13.2*  NEUTROABS 10.1*  --   --   HGB 10.9* 12.9* 11.6*  HCT 33.0* 40.0 35.9*  MCV 87.3 87.7 89.5  PLT 215 226 458   Basic Metabolic Panel: Recent Labs  Lab 08/16/18 0517 08/17/18 0354 08/18/18 0523  NA 135 134* 133*  K 3.8 4.4 4.5  CL 104 107 103  CO2 18* 21* 21*   GLUCOSE 94 99 105*  BUN 13 11 14   CREATININE 0.81 0.86 0.89  CALCIUM 8.2* 8.4* 8.3*  MG  --  1.9  --    GFR: Estimated Creatinine Clearance: 55.9 mL/min (by C-G formula based on SCr of 0.89 mg/dL).  Liver Function Tests: Recent Labs  Lab 08/17/18 0354 08/18/18 0523  AST 26 23  ALT 17 15  ALKPHOS 50 48  BILITOT 0.6 0.7  PROT 5.5* 5.5*  ALBUMIN 2.5* 2.5*    Recent Labs  Lab 08/17/18 0413  AMMONIA 15    HbA1C: Hgb A1c MFr Bld  Date/Time Value Ref Range Status  08/14/2018 10:08 AM 5.7 (H) 4.8 - 5.6 % Final    Comment:    (NOTE) Pre diabetes:          5.7%-6.4% Diabetes:              >6.4% Glycemic control for   <7.0% adults with diabetes   12/06/2017 03:10 PM 6.2 4.6 - 6.5 % Final    Comment:    Glycemic Control Guidelines for People with Diabetes:Non Diabetic:  <6%Goal of Therapy: <7%Additional Action Suggested:  >8%     CBG: Recent Labs  Lab 08/13/18 1828  GLUCAP 134*    Scheduled Meds: . amLODipine  10 mg Oral Daily  . aspirin EC  81 mg Oral QHS  . atorvastatin  40 mg Oral Daily  . azithromycin  250 mg Oral Daily  . bethanechol  10 mg Oral TID  . carvedilol  12.5 mg Oral BID WC  . docusate sodium  200 mg Oral QHS  . feeding supplement (ENSURE ENLIVE)  237 mL Oral TID BM  . finasteride  5 mg Oral Daily  . latanoprost  1 drop Both Eyes QHS  . losartan  50 mg Oral BID  . pantoprazole  40 mg Oral Daily  . potassium chloride  40 mEq Oral BID  . timolol  1 drop Both Eyes Daily   Continuous Infusions: . sodium chloride 10 mL/hr at 08/18/18 1655  . cefTRIAXone (ROCEPHIN)  IV 1 g (08/18/18 2333)     LOS: 4 days   Cherene Altes, MD [facility] Office  [phone] Pager - Text Page per Amion  If 7PM-7AM, please contact night-coverage per Amion 08/19/2018, 9:59 AM

## 2018-08-19 NOTE — Progress Notes (Signed)
Physical Therapy Treatment Patient Details Name: Troy Ray MRN: [id number] DOB: 08-22-1928 Today's Date: 08/19/2018    History of Present Illness 84 yo male admitted with severe back pain. Hx of comp fx-s/p thoracic KP, CAD, CKD, hearing loss.     PT Comments    Pt performed gait training and functional mobility he is requiring +2 mod to stand due to strong posterior lean, and mod-max assistance with close chair follow for gait training.  Pt continues to benefit from skilled rehab at SNF short term to improve strength and function.  Pt presents with tight heel cord and will inform nursing to look into B PRAFO but to prevent further heel cord tightening when resting.      Follow Up Recommendations  SNF     Equipment Recommendations  None recommended by PT    Recommendations for Other Services       Precautions / Restrictions Precautions Precautions: Fall Restrictions Weight Bearing Restrictions: No    Mobility  Bed Mobility Overal bed mobility: Needs Assistance Bed Mobility: Rolling;Sidelying to Sit;Sit to Supine Rolling: Mod assist Sidelying to sit: Mod assist       General bed mobility comments: Cues for sequencing and hand placement. Pt required redirection to look towards railing when reaching for surface.  Pt faciliated LE advancement and trunk elevation.    Transfers Overall transfer level: Needs assistance Equipment used: Rolling walker (2 wheeled) Transfers: Sit to/from Stand Sit to Stand: Mod assist;+2 physical assistance         General transfer comment: Pt holding to RW due to cognitive deficits.  Pt required cues for weight shifting forward and assistance to boost into standing.    Ambulation/Gait Ambulation/Gait assistance: Mod assist;Max assist Gait Distance (Feet): 40 Feet Assistive device: Rolling walker (2 wheeled) Gait Pattern/deviations: Step-through pattern;Decreased stride length;Trunk flexed;Shuffle;Narrow base of  support;Scissoring;Decreased dorsiflexion - right;Decreased dorsiflexion - left     General Gait Details: Pt with decreased BOS, shuffling feet with IR or B hips.  Required cues to increased BOS to avoid scissoring.  Cues for upper trunk control.  Pt fatigues and required chair follow and push back to room.     Stairs             Wheelchair Mobility    Modified Rankin (Stroke Patients Only)       Balance Overall balance assessment: Needs assistance;History of Falls Sitting-balance support: Bilateral upper extremity supported;Feet supported Sitting balance-Leahy Scale: Poor Sitting balance - Comments: posterior bias, with posterior pelvic tilt.  LOB to the R in sitting.       Standing balance-Leahy Scale: Poor Standing balance comment: Lists to the R during gait training and standing.                              Cognition Arousal/Alertness: Awake/alert Behavior During Therapy: WFL for tasks assessed/performed Overall Cognitive Status: Impaired/Different from baseline Area of Impairment: Orientation;Following commands;Safety/judgement;Problem solving                 Orientation Level: Disoriented to;Place;Situation(reports he is a hospital but unable to name location or why he is here.  )     Following Commands: Follows one step commands inconsistently;Follows one step commands with increased time Safety/Judgement: Decreased awareness of safety;Decreased awareness of deficits   Problem Solving: Slow processing;Difficulty sequencing        Exercises      General Comments  Pertinent Vitals/Pain Pain Assessment: Faces Faces Pain Scale: Hurts even more Pain Location: back Pain Descriptors / Indicators: Aching;Sore;Discomfort;Moaning Pain Intervention(s): Monitored during session;Repositioned    Home Living                      Prior Function            PT Goals (current goals can now be found in the care plan section)  Acute Rehab PT Goals Patient Stated Goal: pain control. to get stronger. Potential to Achieve Goals: Good Progress towards PT goals: Progressing toward goals    Frequency    Min 2X/week      PT Plan Current plan remains appropriate;Frequency needs to be updated    Co-evaluation              AM-PAC PT "6 Clicks" Daily Activity  Outcome Measure  Difficulty turning over in bed (including adjusting bedclothes, sheets and blankets)?: Unable Difficulty moving from lying on back to sitting on the side of the bed? : Unable Difficulty sitting down on and standing up from a chair with arms (e.g., wheelchair, bedside commode, etc,.)?: Unable Help needed moving to and from a bed to chair (including a wheelchair)?: A Lot Help needed walking in hospital room?: A Lot Help needed climbing 3-5 steps with a railing? : Total 6 Click Score: 8    End of Session Equipment Utilized During Treatment: Gait belt Activity Tolerance: Patient tolerated treatment well Patient left: in bed;with call bell/phone within reach;with bed alarm set;with family/visitor present Nurse Communication: Mobility status PT Visit Diagnosis: Muscle weakness (generalized) (M62.81);Difficulty in walking, not elsewhere classified (R26.2);Pain;Unsteadiness on feet (R26.81);History of falling (Z91.81) Pain - part of body: (back )     Time: 9924-2683 PT Time Calculation (min) (ACUTE ONLY): 28 min  Charges:  $Gait Training: 8-22 mins $Therapeutic Activity: 8-22 mins                     [name], PTA Acute Rehabilitation Services Pager [phone] Office [phone]     Talisha Erby Eli Hose 08/19/2018, 11:20 AM

## 2018-08-20 MED ORDER — LOSARTAN POTASSIUM 50 MG PO TABS
100.0000 mg | ORAL_TABLET | Freq: Two times a day (BID) | ORAL | Status: DC
  Administered 2018-08-20 – 2018-08-23 (×6): 100 mg via ORAL
  Filled 2018-08-20 (×7): qty 2

## 2018-08-20 MED ORDER — QUETIAPINE FUMARATE 25 MG PO TABS
12.5000 mg | ORAL_TABLET | Freq: Every day | ORAL | Status: DC
  Administered 2018-08-20 – 2018-08-21 (×2): 12.5 mg via ORAL
  Filled 2018-08-20 (×2): qty 1

## 2018-08-20 MED ORDER — METOPROLOL TARTRATE 5 MG/5ML IV SOLN: 2.5000 mg | Freq: Four times a day (QID) | INTRAVENOUS | Status: DC | PRN

## 2018-08-20 NOTE — Care Management Important Message (Signed)
Important Message  Patient Details  Name: Troy Ray MRN: [id number] Date of Birth: 1928/10/24   Medicare Important Message Given:  Yes    [name] 08/20/2018, 3:36 PM

## 2018-08-20 NOTE — Progress Notes (Signed)
[place] TEAM 1 - Stepdown/ICU TEAM  DAVIAN HANSHAW  AYT:[id number] DOB: April 11, 1928 DOA: 08/08/2018 PCP: [name], MD    Brief Narrative:  84yo M w/ a hx ofCAD, stage 3 CKD, HTN, prior T4/T5 vertebroplasty, BPH, diverticulosis, and HLD who was initially admitted to [facility] from [facility] with worsening back pain for 1 month. An extensive work up with CT angio of the chest abdomen and pelvis revealed no acute changes in the vertebra or new fractures.  While an inpatient he developed agitation and was given IV Haldol 2 mg once. He then developed slurred speech, weakness, lethargy and confusion about 3 hours later that raised concern for an acute CVA. CT head and CTA head and neck did not reveal acute findings to explain his symptoms. He was transferred to [facility]. MRI brain without acute finding to explain his symptoms.  Echocardiogram basically normal. Chest x-ray showed a possible RLL infiltrate.    Subjective: The pt is sleeping in bed at the time of my visit. His RN reports that he was alert but confused this morning. He is in no apparent resp distress, nor is there evidence of uncontrolled pain.   Assessment & Plan:  Acute encephalopathy - acute delirium  At this point it appears this most likely represents sundowning/inpatient delirium - MRI brain not impressive except for small vessel related atrophy - no clear metabolic etiology noted - UA negative for infxn 10/8 and 10/9 - TSH normal - folate, ammonia, B12 all normal - avoid sedatives as able - nothing else to do at this point but to give him more time to recover - suspect d/c to rehab facility is in his best interest, so as not to delay his physical recover - a change of venue may also assist in him recovering from his sundowning   Mild hyponatremia  Na is stable   RLL infiltrate concerning for pneumonia Has completed a 5 day course of Ceftriaxone and azithromycin - no convincing evidence of an active pulm infection at this  time   Chronic Back pain due to old fracture of T4 and T5 - status post remote vertebroplasty - will need a rehab stay after d/c - if remains clinically stable, plan is for d/c to rehab on Wednesday, even if mental status remains altered, as change of venue may assist in recovery   Hypertension BP remains quite variable, w/ range of 130 > 193 - adjust tx again today and follow - avoid overcorrection   AAA - Mural thrombus/plaque descending thoracic aorta Noted on CTa chest - will need outpt f/u, though is likely beyond age he would benefit from any intervention   CKD 2 crt stable  CAD No evidence of ACS presently   BPH   DVT prophylaxis: lovenox  Code Status: DNR - NO CODE Family Communication: no family present at time of exam  Disposition Plan: stable for transition to med surg bed - stop tele - tentative plan for d/c to SNF for rehab 10/16 regardless of mental status   Consultants:  None  Antimicrobials:  Azithro 10/10 > 10/14 Rocephin 10/10 > 10/14  Objective: Blood pressure (!) 130/52, pulse (!) 57, temperature 97.6 F (36.4 C), temperature source Axillary, resp. rate 16, height 5\' 9"  (1.753 m), weight 68.9 kg, SpO2 96 %.  Intake/Output Summary (Last 24 hours) at 08/20/2018 1402 Last data filed at 08/19/2018 2141 Gross per 24 hour  Intake 30 ml  Output 500 ml  Net -470 ml  Filed Weights   08/08/18 2327 08/08/18 2328 08/13/18 2000  Weight: 64.4 kg 64.8 kg 68.9 kg    Examination: General: No acute respiratory distress - sleepy Lungs: CTA B  Cardiovascular: RRR - no rub or M  Abdomen: NT/ND, soft, BS+, no mass  Extremities: No edema B LE   CBC: Recent Labs  Lab 08/14/18 1258 08/16/18 0517 08/17/18 0354  WBC 13.2* 17.5* 13.2*  NEUTROABS 10.1*  --   --   HGB 10.9* 12.9* 11.6*  HCT 33.0* 40.0 35.9*  MCV 87.3 87.7 89.5  PLT 215 226 350   Basic Metabolic Panel: Recent Labs  Lab 08/16/18 0517 08/17/18 0354 08/18/18 0523  NA 135 134* 133*  K 3.8  4.4 4.5  CL 104 107 103  CO2 18* 21* 21*  GLUCOSE 94 99 105*  BUN 13 11 14   CREATININE 0.81 0.86 0.89  CALCIUM 8.2* 8.4* 8.3*  MG  --  1.9  --    GFR: Estimated Creatinine Clearance: 55.9 mL/min (by C-G formula based on SCr of 0.89 mg/dL).  Liver Function Tests: Recent Labs  Lab 08/17/18 0354 08/18/18 0523  AST 26 23  ALT 17 15  ALKPHOS 50 48  BILITOT 0.6 0.7  PROT 5.5* 5.5*  ALBUMIN 2.5* 2.5*    Recent Labs  Lab 08/17/18 0413  AMMONIA 15    HbA1C: Hgb A1c MFr Bld  Date/Time Value Ref Range Status  08/14/2018 10:08 AM 5.7 (H) 4.8 - 5.6 % Final    Comment:    (NOTE) Pre diabetes:          5.7%-6.4% Diabetes:              >6.4% Glycemic control for   <7.0% adults with diabetes   12/06/2017 03:10 PM 6.2 4.6 - 6.5 % Final    Comment:    Glycemic Control Guidelines for People with Diabetes:Non Diabetic:  <6%Goal of Therapy: <7%Additional Action Suggested:  >8%     CBG: Recent Labs  Lab 08/13/18 1828  GLUCAP 134*    Scheduled Meds: . amantadine  100 mg Oral Daily  . amLODipine  10 mg Oral Daily  . aspirin EC  81 mg Oral QHS  . atorvastatin  40 mg Oral Daily  . bethanechol  10 mg Oral TID  . carvedilol  12.5 mg Oral BID WC  . docusate sodium  200 mg Oral QHS  . feeding supplement (ENSURE ENLIVE)  237 mL Oral TID BM  . finasteride  5 mg Oral Daily  . latanoprost  1 drop Both Eyes QHS  . losartan  50 mg Oral BID  . Melatonin  3 mg Oral QHS  . omega-3 acid ethyl esters  2 g Oral BID  . pantoprazole  40 mg Oral Daily  . potassium chloride  40 mEq Oral BID  . sertraline  25 mg Oral Daily  . timolol  1 drop Both Eyes Daily      LOS: 5 days   Cherene Altes, MD [facility] Office  [phone] Pager - Text Page per Amion  If 7PM-7AM, please contact night-coverage per Amion 08/20/2018, 2:02 PM

## 2018-08-20 NOTE — Progress Notes (Signed)
Occupational Therapy Treatment Patient Details Name: Troy Ray MRN: [id number] DOB: 1928/03/13 Today's Date: 08/20/2018    History of present illness 84 yo male admitted with severe back pain. Hx of comp fx-s/p thoracic KP, CAD, CKD, hearing loss.    OT comments   Pt was seen by OT for ADL retraining session today: Per family report, pt has been difficult to arouse today. Pt was repositioned in bed with goal of sitting EOB for activities, grooming etc. Pt was max-total assist to reposition in bed in preparation for increased ADL participation followed by Max-total assist with hand over hand to wash his face/hands, also in an attempt increase alertness during therapy session. Pt family requested to speak with hospitialist as they have many questions and RN was notified of this.   Follow Up Recommendations  SNF    Equipment Recommendations  3 in 1 bedside commode    Recommendations for Other Services      Precautions / Restrictions Precautions Precautions: Fall Restrictions Weight Bearing Restrictions: No       Mobility Bed Mobility Overal bed mobility: Needs Assistance Bed Mobility: Rolling(Roll and reposition in bed as pt was leaning to left against bedrails upon OT arrival) Rolling: Max assist;Total assist         General bed mobility comments: Overall max-total assist to reposition in bed due to pt current level of alertness.  Transfers                      Balance                                           ADL either performed or assessed with clinical judgement   ADL Overall ADL's : Needs assistance/impaired     Grooming: Wash/dry face;Wash/dry hands;Maximal assistance;Total assistance;Bed level Grooming Details (indicate cue type and reason): Attempted grooming today at bed level however pt was not easily aroused. He was overall max-total hand over hand assist to wash his face, in an attempt to increase alertness for participation  in therapy session. It is noted that on 08/13/18, pt was Min A in unsupported sitting.                               General ADL Comments: Pt was seen by OT for ADL retraining session today: Pt/family were educated in role of OT and goals. Per family report, pt has been difficult to arouse today. Pt was assisted in repositioning in bed with goal to perform sitting EOB activities, grooming etc. Pt was overall max-total assist to reposition in bed in preparation for increased ADL participation followed by Max-total assist with hand over hand to wash his face/hands, also in an attempt increase alertness during therapy session. Pt family requested to speak with hospitialist as they have many questions and RN was notified of this as pt family would be present for a limited time this morning per their report. Pt was unsafe to sit up at EOB at this time.     Vision       Perception     Praxis      Cognition Arousal/Alertness: Lethargic(Difficult to arouse) Behavior During Therapy: (Pt only opened eyes and spoke for brief amounts of time off and on during this session then appeared to fall back asleep.)  Exercises     Shoulder Instructions       General Comments      Pertinent Vitals/ Pain       Pain Assessment: Faces Faces Pain Scale: Hurts a little bit Pain Location: back Pain Descriptors / Indicators: Grimacing;Moaning Pain Intervention(s): Repositioned;Limited activity within patient's tolerance;Monitored during session  Home Living                                          Prior Functioning/Environment              Frequency  Min 2X/week        Progress Toward Goals  OT Goals(current goals can now be found in the care plan section)  Progress towards OT goals: Not progressing toward goals - comment(Difficult to arouse for ADL participation)  Acute Rehab OT Goals Patient Stated  Goal: Pt unable to state due to lethergy/difficult to arouse  Plan Discharge plan remains appropriate    Co-evaluation                 AM-PAC PT "6 Clicks" Daily Activity     Outcome Measure   Help from another person eating meals?: A Little Help from another person taking care of personal grooming?: A Lot Help from another person toileting, which includes using toliet, bedpan, or urinal?: A Lot Help from another person bathing (including washing, rinsing, drying)?: A Lot Help from another person to put on and taking off regular upper body clothing?: A Lot   6 Click Score: 11    End of Session    OT Visit Diagnosis: Unsteadiness on feet (R26.81);Muscle weakness (generalized) (M62.81)   Activity Tolerance Patient limited by fatigue;Patient limited by lethargy;Other (comment)(Pt was difficult to arouse during this session )   Patient Left in bed;with call bell/phone within reach;with bed alarm set;with family/visitor present   Nurse Communication Other (comment)(Pt family requests to speak with MD. RN notified of this)        Time: 3790-2409 OT Time Calculation (min): 10 min  Charges: OT General Charges $OT Visit: 1 Visit OT Treatments $Self Care/Home Management : 8-22 mins    Barnhill, Amy Beth Dixon, OTR/L 08/20/2018, 11:31 AM

## 2018-08-21 LAB — BASIC METABOLIC PANEL
Anion gap: 6 (ref 5–15)
BUN: 18 mg/dL (ref 8–23)
CO2: 23 mmol/L (ref 22–32)
Calcium: 8.6 mg/dL — ABNORMAL LOW (ref 8.9–10.3)
Chloride: 101 mmol/L (ref 98–111)
Creatinine, Ser: 0.98 mg/dL (ref 0.61–1.24)
GFR calc Af Amer: >60 mL/min (ref >60)
Glucose, Bld: 114 mg/dL — ABNORMAL HIGH (ref 70–99)
POTASSIUM: 5.1 mmol/L (ref 3.5–5.1)
SODIUM: 130 mmol/L — AB (ref 135–145)

## 2018-08-21 LAB — POTASSIUM: POTASSIUM: 6.2 mmol/L — AB (ref 3.5–5.1)

## 2018-08-21 LAB — CBC
HCT: 34.6 % — ABNORMAL LOW (ref 39.0–52.0)
Hemoglobin: 11 g/dL — ABNORMAL LOW (ref 13.0–17.0)
MCH: 28.6 pg (ref 26.0–34.0)
MCHC: 31.8 g/dL (ref 30.0–36.0)
MCV: 90.1 fL (ref 80.0–100.0)
PLATELETS: 314 10*3/uL (ref 150–400)
RBC: 3.84 MIL/uL — AB (ref 4.22–5.81)
RDW: 15.3 % (ref 11.5–15.5)
WBC: 12.8 10*3/uL — AB (ref 4.0–10.5)
nRBC: 0 % (ref 0.0–0.2)

## 2018-08-21 LAB — LACTIC ACID, PLASMA
Lactic Acid, Venous: 1.4 mmol/L (ref 0.5–1.9)
Lactic Acid, Venous: 1.5 mmol/L (ref 0.5–1.9)

## 2018-08-21 LAB — PROCALCITONIN: Procalcitonin: 0.1 ng/mL

## 2018-08-21 MED ORDER — ENOXAPARIN SODIUM 40 MG/0.4ML ~~LOC~~ SOLN
40.0000 mg | SUBCUTANEOUS | Status: DC
  Administered 2018-08-21 – 2018-08-22 (×2): 40 mg via SUBCUTANEOUS
  Filled 2018-08-21 (×2): qty 0.4

## 2018-08-21 MED ORDER — POLYETHYLENE GLYCOL 3350 17 G PO PACK
17.0000 g | PACK | Freq: Every day | ORAL | Status: DC
  Administered 2018-08-21 – 2018-08-23 (×3): 17 g via ORAL
  Filled 2018-08-21 (×3): qty 1

## 2018-08-21 MED ORDER — HYDRALAZINE HCL 20 MG/ML IJ SOLN: 10.0000 mg | Freq: Four times a day (QID) | INTRAMUSCULAR | Status: DC | PRN

## 2018-08-21 MED ORDER — SENNOSIDES-DOCUSATE SODIUM 8.6-50 MG PO TABS
1.0000 | ORAL_TABLET | Freq: Two times a day (BID) | ORAL | Status: DC
  Administered 2018-08-21 – 2018-08-23 (×4): 1 via ORAL
  Filled 2018-08-21 (×4): qty 1

## 2018-08-21 NOTE — Progress Notes (Signed)
Physical Therapy Treatment Patient Details Name: Troy Ray MRN: [id number] DOB: 04/14/1928 Today's Date: 08/21/2018    History of Present Illness 84 yo male admitted with severe back pain. Hx of comp fx-s/p thoracic KP, CAD, CKD, hearing loss.     PT Comments    Pt performed bed mobility with progression to standing.  In standing only able to progress sidesteps to [facility].  Pt on arrival sleeping soundly and difficult to rouse.  Pt with limited participation due to lethargy.    Follow Up Recommendations  SNF     Equipment Recommendations  None recommended by PT    Recommendations for Other Services       Precautions / Restrictions Precautions Precautions: Fall Restrictions Weight Bearing Restrictions: No    Mobility  Bed Mobility Overal bed mobility: Needs Assistance Bed Mobility: Rolling Rolling: +2 for physical assistance;Max assist   Supine to sit: Max assist;+2 for physical assistance Sit to supine: Max assist;+2 for physical assistance   General bed mobility comments: Pt required assistance to move in bed due to level of consciousness.  Once sitting edge of bed he was able to keep his eyes open but focus on task remain limited.    Transfers Overall transfer level: Needs assistance Equipment used: Rolling walker (2 wheeled) Transfers: Sit to/from Stand Sit to Stand: Mod assist;+2 physical assistance         General transfer comment: Pt holding RW with strong posterior lean.  Difficult to correct posture.  Pt stood at edge of bed and took steps to head of bed but further gait distance deferred.    Ambulation/Gait Ambulation/Gait assistance: (NT)               Stairs             Wheelchair Mobility    Modified Rankin (Stroke Patients Only)       Balance Overall balance assessment: Needs assistance;History of Falls Sitting-balance support: Bilateral upper extremity supported;Feet supported Sitting balance-Leahy Scale: Poor Sitting  balance - Comments: posterior bias, with posterior pelvic tilt.  LOB to the R in sitting.       Standing balance-Leahy Scale: Poor Standing balance comment: Lists to the R during standing.                              Cognition Arousal/Alertness: Lethargic;Suspect due to medications Behavior During Therapy: (in and out of sleep initially if speaking to patient he would rouse, during bed mobility and standing he was alert.  ) Overall Cognitive Status: Difficult to assess                                 General Comments: Wife at bed side and he reports he does not know her and referred to her as the "girl down the street."      Exercises      General Comments        Pertinent Vitals/Pain Pain Assessment: Faces Faces Pain Scale: Hurts a little bit Pain Location: back Pain Descriptors / Indicators: Grimacing;Moaning Pain Intervention(s): Monitored during session;Repositioned;Ice applied    Home Living                      Prior Function            PT Goals (current goals can now be found in the care plan  section) Acute Rehab PT Goals Patient Stated Goal: Pt unable to state due to lethergy/difficult to arouse Potential to Achieve Goals: Good Progress towards PT goals: Progressing toward goals    Frequency    Min 2X/week      PT Plan Current plan remains appropriate;Frequency needs to be updated    Co-evaluation              AM-PAC PT "6 Clicks" Daily Activity  Outcome Measure  Difficulty turning over in bed (including adjusting bedclothes, sheets and blankets)?: Unable Difficulty moving from lying on back to sitting on the side of the bed? : Unable Difficulty sitting down on and standing up from a chair with arms (e.g., wheelchair, bedside commode, etc,.)?: Unable Help needed moving to and from a bed to chair (including a wheelchair)?: A Lot Help needed walking in hospital room?: A Lot Help needed climbing 3-5 steps with  a railing? : Total 6 Click Score: 8    End of Session Equipment Utilized During Treatment: Gait belt Activity Tolerance: Patient tolerated treatment well Patient left: in bed;with call bell/phone within reach;with bed alarm set;with family/visitor present Nurse Communication: Mobility status PT Visit Diagnosis: Muscle weakness (generalized) (M62.81);Difficulty in walking, not elsewhere classified (R26.2);Pain;Unsteadiness on feet (R26.81);History of falling (Z91.81) Pain - part of body: (back)     Time: 0865-7846 PT Time Calculation (min) (ACUTE ONLY): 20 min  Charges:  $Therapeutic Activity: 8-22 mins                     Troy Ray, PTA Acute Rehabilitation Services Pager [phone] Office [phone]     Troy Ray 08/21/2018, 5:07 PM

## 2018-08-21 NOTE — Progress Notes (Signed)
PROGRESS NOTE  Troy Ray YSA:[id number] DOB: 06-17-1928 DOA: 08/08/2018 PCP: [name], MD  HPI/Recap of past 24 hours: 84yo M w/ a hx ofCAD, stage 3 CKD, HTN, prior T4/T5 vertebroplasty, BPH, diverticulosis, and HLD who was initially admitted to [facility] from [facility] with worsening back pain for 1 month. An extensive work up with CT angio of the chest abdomen and pelvis revealed no acute changes in the vertebra or new fractures.  While an inpatient he developed agitation and was given IV Haldol 2 mg once. He then developed slurred speech, weakness, lethargy and confusion about 3 hours later that raised concern for an acute CVA. CT head and CTA head and neck did not reveal acute findings to explain his symptoms. He was transferred to [facility]. MRI brain without acute finding to explain his symptoms. Echocardiogram basically normal. Chest x-ray showed a possible RLL infiltrate.   08/21/2018: Patient seen and examined at his bedside.  Somnolent but easily arousable to voices.  Does not appear to be in acute distress.  Not agitated but still confused.  Safety sitter DC'd at 7 AM this morning 08/21/18.  Assessment/Plan: Active Problems:   Hyperlipidemia, mixed   Essential hypertension   Coronary atherosclerosis   BPH (benign prostatic hyperplasia)   Leukocytosis   Vertebral fracture, osteoporotic (HCC)   Back pain   Pressure injury of skin   Acute metabolic encephalopathy  Acute metabolic encephalopathy suspect delirium from unfamiliar environment MRI brain no significant acute findings TSH, folate, UA, ammonia, B12 all normal Avoid sedatives Safety sitter dc this am at 7am Reorient as indicated And to discharge to SNF tomorrow 08/22/2018  Hyperkalemia Suspect iatrogenic Stop potassium supplement Repeat BMP in the morning  Mild euvolemic hyponatremia Suspect SIADH from chronic pain Sodium 130  Repeat BMP in the morning  Chronic back pain status post  vertebroplasty Secondary to old fracture at T4 and T5 Will DC to SNF for physical rehab possibly tomorrow  Mild thrombus/plaque at descending thoracic aorta 3.4 cm aneurysm Noted on CTA chest Will need surveillance outpatient  Right lower lobe infiltrate concerning for community-acquired pneumonia Completed 6 days of IV ceftriaxone and azithromycin     Code Status: DNR  Family Communication: None at bedside  Disposition Plan: SNF possibly tomorrow 08/22/2018   Consultants:  None  Procedures:  None  Antimicrobials:  None  DVT prophylaxis: Subcu Lovenox daily   Objective: Vitals:   08/20/18 2347 08/21/18 0410 08/21/18 0754 08/21/18 1129  BP: (!) 164/66 (!) 159/73 (!) 182/51 (!) 186/65  Pulse: (!) 57 (!) 58 (!) 56 64  Resp: 17 17 18 18   Temp: (!) 97.5 F (36.4 C) 97.6 F (36.4 C) 97.8 F (36.6 C) (!) 97.5 F (36.4 C)  TempSrc: Axillary Axillary Oral Oral  SpO2: 94% 95% 96% 98%  Weight:      Height:        Intake/Output Summary (Last 24 hours) at 08/21/2018 1459 Last data filed at 08/21/2018 3235 Gross per 24 hour  Intake 120 ml  Output 825 ml  Net -705 ml   Filed Weights   08/08/18 2327 08/08/18 2328 08/13/18 2000  Weight: 64.4 kg 64.8 kg 68.9 kg    Exam:  . General: 84 y.o. year-old male well developed well nourished in no acute distress.  Somnolent in no acute distress.  Not agitated easily arousable to voices. . Cardiovascular: Regular rate and rhythm with no rubs or gallops.  No thyromegaly or JVD noted.   [name] Kitchen  Respiratory: Clear to auscultation with no wheezes or rales. Good inspiratory effort. . Abdomen: Soft nontender nondistended with normal bowel sounds x4 quadrants. . Musculoskeletal: Trace lower extremity edema. 2/4 pulses in all 4 extremities. [name] Kitchen Psychiatry: Unable to assess mood due to somnolence   Data Reviewed: CBC: Recent Labs  Lab 08/16/18 0517 08/17/18 0354 08/21/18 0756  WBC 17.5* 13.2* 12.8*  HGB 12.9* 11.6* 11.0*  HCT  40.0 35.9* 34.6*  MCV 87.7 89.5 90.1  PLT 226 246 027   Basic Metabolic Panel: Recent Labs  Lab 08/15/18 1847 08/16/18 0517 08/17/18 0354 08/18/18 0523 08/21/18 0756  NA 134* 135 134* 133* 130*  K 3.4* 3.8 4.4 4.5 5.1  CL 102 104 107 103 101  CO2 20* 18* 21* 21* 23  GLUCOSE 88 94 99 105* 114*  BUN 14 13 11 14 18   CREATININE 0.90 0.81 0.86 0.89 0.98  CALCIUM 8.1* 8.2* 8.4* 8.3* 8.6*  MG  --   --  1.9  --   --    GFR: Estimated Creatinine Clearance: 50.8 mL/min (by C-G formula based on SCr of 0.98 mg/dL). Liver Function Tests: Recent Labs  Lab 08/17/18 0354 08/18/18 0523  AST 26 23  ALT 17 15  ALKPHOS 50 48  BILITOT 0.6 0.7  PROT 5.5* 5.5*  ALBUMIN 2.5* 2.5*   No results for input(s): LIPASE, AMYLASE in the last 168 hours. Recent Labs  Lab 08/17/18 0413  AMMONIA 15   Coagulation Profile: No results for input(s): INR, PROTIME in the last 168 hours. Cardiac Enzymes: No results for input(s): CKTOTAL, CKMB, CKMBINDEX, TROPONINI in the last 168 hours. BNP (last 3 results) No results for input(s): PROBNP in the last 8760 hours. HbA1C: No results for input(s): HGBA1C in the last 72 hours. CBG: No results for input(s): GLUCAP in the last 168 hours. Lipid Profile: No results for input(s): CHOL, HDL, LDLCALC, TRIG, CHOLHDL, LDLDIRECT in the last 72 hours. Thyroid Function Tests: No results for input(s): TSH, T4TOTAL, FREET4, T3FREE, THYROIDAB in the last 72 hours. Anemia Panel: No results for input(s): VITAMINB12, FOLATE, FERRITIN, TIBC, IRON, RETICCTPCT in the last 72 hours. Urine analysis:    Component Value Date/Time   COLORURINE YELLOW 08/14/2018 1904   APPEARANCEUR CLEAR 08/14/2018 1904   LABSPEC 1.029 08/14/2018 1904   PHURINE 6.0 08/14/2018 1904   GLUCOSEU NEGATIVE 08/14/2018 1904   HGBUR NEGATIVE 08/14/2018 1904   HGBUR small 07/04/2010 [place] NEGATIVE 08/14/2018 1904   KETONESUR 5 (A) 08/14/2018 1904   PROTEINUR NEGATIVE 08/14/2018 1904     UROBILINOGEN 0.2 08/20/2014 1056   NITRITE NEGATIVE 08/14/2018 1904   LEUKOCYTESUR NEGATIVE 08/14/2018 1904   Sepsis Labs: @LABRCNTIP (procalcitonin:4,lacticidven:4)  )No results found for this or any previous visit (from the past 240 hour(s)).    Studies: No results found.  Scheduled Meds: . amLODipine  10 mg Oral Daily  . aspirin EC  81 mg Oral QHS  . atorvastatin  40 mg Oral Daily  . carvedilol  12.5 mg Oral BID WC  . docusate sodium  200 mg Oral QHS  . feeding supplement (ENSURE ENLIVE)  237 mL Oral TID BM  . finasteride  5 mg Oral Daily  . latanoprost  1 drop Both Eyes QHS  . losartan  100 mg Oral BID  . Melatonin  3 mg Oral QHS  . omega-3 acid ethyl esters  2 g Oral BID  . pantoprazole  40 mg Oral Daily  . QUEtiapine  12.5 mg Oral QHS  .  timolol  1 drop Both Eyes Daily    Continuous Infusions:   LOS: 6 days     Kayleen Memos, MD [facility] Pager [phone]  If 7PM-7AM, please contact night-coverage [email or web address] Password [email or web address] 08/21/2018, 2:59 PM

## 2018-08-21 NOTE — Progress Notes (Signed)
Pt safety sitter d/c 7 AM 08/21/18

## 2018-08-22 ENCOUNTER — Inpatient Hospital Stay (HOSPITAL_COMMUNITY): Payer: Medicare HMO

## 2018-08-22 LAB — GLUCOSE, CAPILLARY: Glucose-Capillary: 94 mg/dL (ref 70–99)

## 2018-08-22 LAB — COMPREHENSIVE METABOLIC PANEL
ALBUMIN: 2.7 g/dL — AB (ref 3.5–5.0)
ALT: 16 U/L (ref 0–44)
AST: 17 U/L (ref 15–41)
Alkaline Phosphatase: 60 U/L (ref 38–126)
Anion gap: 7 (ref 5–15)
BUN: 17 mg/dL (ref 8–23)
CHLORIDE: 99 mmol/L (ref 98–111)
CO2: 23 mmol/L (ref 22–32)
Calcium: 8.7 mg/dL — ABNORMAL LOW (ref 8.9–10.3)
Creatinine, Ser: 0.95 mg/dL (ref 0.61–1.24)
GFR calc Af Amer: >60 mL/min (ref >60)
Glucose, Bld: 112 mg/dL — ABNORMAL HIGH (ref 70–99)
POTASSIUM: 4.7 mmol/L (ref 3.5–5.1)
Sodium: 129 mmol/L — ABNORMAL LOW (ref 135–145)
Total Bilirubin: 0.6 mg/dL (ref 0.3–1.2)
Total Protein: 6 g/dL — ABNORMAL LOW (ref 6.5–8.1)

## 2018-08-22 LAB — CBC WITH DIFFERENTIAL/PLATELET
Abs Immature Granulocytes: 0.05 10*3/uL (ref 0.00–0.07)
BASOS PCT: 0 %
Basophils Absolute: 0.1 10*3/uL (ref 0.0–0.1)
EOS PCT: 7 %
Eosinophils Absolute: 0.8 10*3/uL — ABNORMAL HIGH (ref 0.0–0.5)
HEMATOCRIT: 36.6 % — AB (ref 39.0–52.0)
Hemoglobin: 11.5 g/dL — ABNORMAL LOW (ref 13.0–17.0)
Immature Granulocytes: 0 %
LYMPHS ABS: 2.2 10*3/uL (ref 0.7–4.0)
Lymphocytes Relative: 19 %
MCH: 27.9 pg (ref 26.0–34.0)
MCHC: 31.4 g/dL (ref 30.0–36.0)
MCV: 88.8 fL (ref 80.0–100.0)
MONOS PCT: 8 %
Monocytes Absolute: 0.9 10*3/uL (ref 0.1–1.0)
NEUTROS PCT: 66 %
NRBC: 0 % (ref 0.0–0.2)
Neutro Abs: 7.4 10*3/uL (ref 1.7–7.7)
PLATELETS: 348 10*3/uL (ref 150–400)
RBC: 4.12 MIL/uL — ABNORMAL LOW (ref 4.22–5.81)
RDW: 15.2 % (ref 11.5–15.5)
WBC: 11.3 10*3/uL — AB (ref 4.0–10.5)

## 2018-08-22 MED ORDER — FERROUS SULFATE 325 (65 FE) MG PO TABS
325.0000 mg | ORAL_TABLET | Freq: Two times a day (BID) | ORAL | Status: DC
  Administered 2018-08-22 – 2018-08-23 (×2): 325 mg via ORAL
  Filled 2018-08-22 (×2): qty 1

## 2018-08-22 MED ORDER — DEXTROSE-NACL 5-0.45 % IV SOLN: INTRAVENOUS | Status: DC

## 2018-08-22 MED ORDER — INSULIN ASPART 100 UNIT/ML IV SOLN
10.0000 [IU] | Freq: Once | INTRAVENOUS | Status: AC
  Administered 2018-08-22: 10 [IU] via INTRAVENOUS

## 2018-08-22 MED ORDER — CALCIUM GLUCONATE-NACL 1-0.675 GM/50ML-% IV SOLN
1.0000 g | Freq: Once | INTRAVENOUS | Status: AC
  Administered 2018-08-22: 1000 mg via INTRAVENOUS
  Filled 2018-08-22: qty 50

## 2018-08-22 MED ORDER — DEXTROSE 50 % IV SOLN
50.0000 mL | Freq: Once | INTRAVENOUS | Status: AC
  Administered 2018-08-22: 50 mL via INTRAVENOUS
  Filled 2018-08-22: qty 50

## 2018-08-22 MED ORDER — SODIUM POLYSTYRENE SULFONATE 15 GM/60ML PO SUSP
30.0000 g | Freq: Once | ORAL | Status: AC
  Administered 2018-08-22: 30 g via ORAL
  Filled 2018-08-22: qty 120

## 2018-08-22 NOTE — Progress Notes (Addendum)
Nutrition Follow-up  DOCUMENTATION CODES:   Not applicable  INTERVENTION:  Continue Ensure Enlive po TID, each supplement provides 350 kcal and 20 grams of protein.  Encourage adequate PO intake.   NUTRITION DIAGNOSIS:   Inadequate oral intake related to decreased appetite as evidenced by meal completion < 25%; ongoing  GOAL:   Patient will meet greater than or equal to 90% of their needs; progressing  MONITOR:   PO intake, Supplement acceptance, Labs, Weight trends, Skin, I & O's  REASON FOR ASSESSMENT:   Malnutrition Screening Tool    ASSESSMENT:   84 y.o. male with medical history significant of CAD, stage 3 CKD, hypertension, prior T4 , T5 vertebroplasty, BPH, diverticulosis and  hyperlipidemia presents with back pain for 1 month. Patient had extensive work up with CT angio of the chest , abdomen and pelvis, but no acute changes in the vertebra or new fractures evident. Chest x-ray showed RLL infiltrate concerning for pneumonia.  Meal completion has been varied from 15-75%. No family at bedside. Pt unable to stay awake during RD assessment. Pt currently has Ensure ordered and has been consuming them. RD to continue with current orders to aid in caloric and protein needs. Labs and medications reviewed.   NUTRITION - FOCUSED PHYSICAL EXAM:  Depletion likely related to the natural aging process.     Most Recent Value  Orbital Region  Unable to assess  Upper Arm Region  Moderate depletion  Thoracic and Lumbar Region  Unable to assess  Buccal Region  Moderate depletion  Temple Region  Moderate depletion  Clavicle Bone Region  Moderate depletion  Clavicle and Acromion Bone Region  Moderate depletion  Scapular Bone Region  Unable to assess  Dorsal Hand  Unable to assess  Patellar Region  Moderate depletion  Anterior Thigh Region  Moderate depletion  Posterior Calf Region  Moderate depletion  Edema (RD Assessment)  None  Hair  Reviewed  Eyes  Reviewed  Mouth   Reviewed  Skin  Reviewed  Nails  Unable to assess       Diet Order:   Diet Order            Diet regular Room service appropriate? Yes; Fluid consistency: Thin  Diet effective now              EDUCATION NEEDS:   Not appropriate for education at this time  Skin:  Skin Assessment: Skin Integrity Issues: Skin Integrity Issues:: Stage I Stage I: buttocks  Last BM:  10/12  Height:   Ht Readings from Last 1 Encounters:  08/13/18 5\' 9"  (1.753 m)    Weight:   Wt Readings from Last 1 Encounters:  08/13/18 68.9 kg    Ideal Body Weight:  72.7 kg  BMI:  Body mass index is 22.43 kg/m.  Estimated Nutritional Needs:   Kcal:  1700-1850  Protein:  80-90 grams  Fluid:  1.7- 1.9 L/day    Corrin Parker, MS, RD, LDN Pager # [phone] After hours/ weekend pager # [phone]

## 2018-08-22 NOTE — Progress Notes (Signed)
PROGRESS NOTE  Troy Ray NOB:[id number] DOB: 1928-02-24 DOA: 08/08/2018 PCP: [name], MD  HPI/Recap of past 24 hours: 84yo M w/ a hx ofCAD, stage 3 CKD, HTN, prior T4/T5 vertebroplasty, BPH, diverticulosis, and HLD who was initially admitted to [facility] from [facility] with worsening back pain for 1 month. An extensive work up with CT angio of the chest abdomen and pelvis revealed no acute changes in the vertebra or new fractures.  While an inpatient he developed agitation and was given IV Haldol 2 mg once. He then developed slurred speech, weakness, lethargy and confusion about 3 hours later that raised concern for an acute CVA. CT head and CTA head and neck did not reveal acute findings to explain his symptoms. He was transferred to [facility]. MRI brain without acute finding to explain his symptoms. Echocardiogram basically normal. Chest x-ray showed a possible RLL infiltrate.   08/21/2018: Patient seen and examined at his bedside.  Somnolent but easily arousable to voices.  Does not appear to be in acute distress.  Not agitated but still confused.  Safety sitter DC'd at 7 AM this morning 08/21/18.  08/22/2018: Patient seen and examined at his bedside.  He is somnolent and difficult to arouse.  CT head with no contrast done on 08/22/2018 unremarkable for any acute intracranial findings.  Patient's son contacted and given update of patient's present condition.  Assessment/Plan: Active Problems:   Hyperlipidemia, mixed   Essential hypertension   Coronary atherosclerosis   BPH (benign prostatic hyperplasia)   Leukocytosis   Vertebral fracture, osteoporotic (HCC)   Back pain   Pressure injury of skin   Acute metabolic encephalopathy  Persistent acute metabolic encephalopathy suspect delirium from unfamiliar environment MRI brain no significant acute findings TSH, folate, UA, ammonia, B12 all normal Repeated CT head with no contrast done on 08/22/2018 revealed no acute  intracranial findings Avoid sedatives Safety sitter [place] on 08/21/2018 Reorient as indicated  Moderate protein calorie malnutrition/poor oral intake/ Albumin 2.7 Nutrition is following Continue oral supplement between meals Start gentle IV fluid hydration D5 half normal at 75 cc/h Monitor urine output Monitor oral intake  Resolving hyperkalemia Post treatment with calcium gluconate IV 10 milligrams once, 10 unit of IV insulin, D50, Kayexalate.  Hypovolemic hyponatremia Suspect secondary to poor oral intake Encourage patient to eat Start gentle IV fluid hydration  BMP in the morning  Chronic back pain status post vertebroplasty No new findings on imaging Secondary to old fracture at T4 and T5 Will DC to SNF for physical rehab possibly tomorrow  Mild thrombus/plaque at descending thoracic aorta 3.4 cm aneurysm Noted on CTA chest Will need surveillance outpatient  Resolving right lower lobe infiltrate concerning for community-acquired pneumonia Completed 6 days of IV ceftriaxone and azithromycin     Code Status: DNR  Family Communication: Spoke with the patient's son and updated on current condition.  Disposition Plan: SNF possibly tomorrow 08/23/2018   Consultants:  None  Procedures:  None  Antimicrobials:  None  DVT prophylaxis: Subcu Lovenox daily   Objective: Vitals:   08/22/18 0400 08/22/18 0819 08/22/18 1243 08/22/18 1547  BP: (!) 160/51 (!) 162/56 (!) 120/47 (!) 168/44  Pulse: (!) 54 60 (!) 54 (!) 55  Resp: 18 18 18    Temp: 97.6 F (36.4 C) (!) 97.5 F (36.4 C) (!) 97.3 F (36.3 C)   TempSrc: Oral Axillary Axillary   SpO2: 97% 95% 98% 95%  Weight:      Height:  Intake/Output Summary (Last 24 hours) at 08/22/2018 1625 Last data filed at 08/22/2018 1249 Gross per 24 hour  Intake 320 ml  Output 800 ml  Net -480 ml   Filed Weights   08/08/18 2328 08/13/18 2000 08/22/18 0123  Weight: 64.8 kg 68.9 kg 68.9 kg     Exam:  . General: 84 y.o. year-old male frail in no acute distress.  Somnolent arousable with sternal rub. . Cardiovascular: Regular rate and rhythm with no rubs or gallops.  No JVD or thyromegaly noted.   [name] Kitchen Respiratory: Clear to auscultation with no wheezes or rales. Good inspiratory effort. . Abdomen: Soft nontender nondistended with normal bowel sounds x4 quadrants. . Musculoskeletal: No lower extremity edema. 2/4 pulses in all 4 extremities. [name] Kitchen Psychiatry: Unable to assess mood due to somnolence   Data Reviewed: CBC: Recent Labs  Lab 08/16/18 0517 08/17/18 0354 08/21/18 0756 08/22/18 0631  WBC 17.5* 13.2* 12.8* 11.3*  NEUTROABS  --   --   --  7.4  HGB 12.9* 11.6* 11.0* 11.5*  HCT 40.0 35.9* 34.6* 36.6*  MCV 87.7 89.5 90.1 88.8  PLT 226 246 314 793   Basic Metabolic Panel: Recent Labs  Lab 08/16/18 0517 08/17/18 0354 08/18/18 0523 08/21/18 0756 08/21/18 1919 08/22/18 0631  NA 135 134* 133* 130*  --  129*  K 3.8 4.4 4.5 5.1 6.2* 4.7  CL 104 107 103 101  --  99  CO2 18* 21* 21* 23  --  23  GLUCOSE 94 99 105* 114*  --  112*  BUN 13 11 14 18   --  17  CREATININE 0.81 0.86 0.89 0.98  --  0.95  CALCIUM 8.2* 8.4* 8.3* 8.6*  --  8.7*  MG  --  1.9  --   --   --   --    GFR: Estimated Creatinine Clearance: 52.4 mL/min (by C-G formula based on SCr of 0.95 mg/dL). Liver Function Tests: Recent Labs  Lab 08/17/18 0354 08/18/18 0523 08/22/18 0631  AST 26 23 17   ALT 17 15 16   ALKPHOS 50 48 60  BILITOT 0.6 0.7 0.6  PROT 5.5* 5.5* 6.0*  ALBUMIN 2.5* 2.5* 2.7*   No results for input(s): LIPASE, AMYLASE in the last 168 hours. Recent Labs  Lab 08/17/18 0413  AMMONIA 15   Coagulation Profile: No results for input(s): INR, PROTIME in the last 168 hours. Cardiac Enzymes: No results for input(s): CKTOTAL, CKMB, CKMBINDEX, TROPONINI in the last 168 hours. BNP (last 3 results) No results for input(s): PROBNP in the last 8760 hours. HbA1C: No results for input(s):  HGBA1C in the last 72 hours. CBG: Recent Labs  Lab 08/22/18 0708  GLUCAP 94   Lipid Profile: No results for input(s): CHOL, HDL, LDLCALC, TRIG, CHOLHDL, LDLDIRECT in the last 72 hours. Thyroid Function Tests: No results for input(s): TSH, T4TOTAL, FREET4, T3FREE, THYROIDAB in the last 72 hours. Anemia Panel: No results for input(s): VITAMINB12, FOLATE, FERRITIN, TIBC, IRON, RETICCTPCT in the last 72 hours. Urine analysis:    Component Value Date/Time   COLORURINE YELLOW 08/14/2018 1904   APPEARANCEUR CLEAR 08/14/2018 1904   LABSPEC 1.029 08/14/2018 1904   PHURINE 6.0 08/14/2018 1904   GLUCOSEU NEGATIVE 08/14/2018 1904   HGBUR NEGATIVE 08/14/2018 1904   HGBUR small 07/04/2010 1559   BILIRUBINUR NEGATIVE 08/14/2018 1904   KETONESUR 5 (A) 08/14/2018 1904   PROTEINUR NEGATIVE 08/14/2018 1904   UROBILINOGEN 0.2 08/20/2014 1056   NITRITE NEGATIVE 08/14/2018 1904   LEUKOCYTESUR NEGATIVE 08/14/2018  1904   Sepsis Labs: @LABRCNTIP (procalcitonin:4,lacticidven:4)  )No results found for this or any previous visit (from the past 240 hour(s)).    Studies: Ct Head Wo Contrast  Result Date: 08/22/2018 CLINICAL DATA:  Altered mental status EXAM: CT HEAD WITHOUT CONTRAST TECHNIQUE: Contiguous axial images were obtained from the base of the skull through the vertex without intravenous contrast. COMPARISON:  Head CT 08/13/2018 FINDINGS: Brain: There is no mass, hemorrhage or extra-axial collection. There is generalized atrophy without lobar predilection. There is no acute or chronic infarction. There is hypoattenuation of the periventricular white matter, most commonly indicating chronic ischemic microangiopathy. Vascular: No abnormal hyperdensity of the major intracranial arteries or dural venous sinuses. No intracranial atherosclerosis. Skull: The visualized skull base, calvarium and extracranial soft tissues are normal. Sinuses/Orbits: No fluid levels or advanced mucosal thickening of the  visualized paranasal sinuses. No mastoid or middle ear effusion. The orbits are normal. IMPRESSION: Chronic small vessel ischemia and atrophy without acute abnormality. Electronically Signed   By: Ulyses Jarred M.D.   On: 08/22/2018 15:47    Scheduled Meds: . amLODipine  10 mg Oral Daily  . aspirin EC  81 mg Oral QHS  . atorvastatin  40 mg Oral Daily  . carvedilol  12.5 mg Oral BID WC  . enoxaparin (LOVENOX) injection  40 mg Subcutaneous Q24H  . feeding supplement (ENSURE ENLIVE)  237 mL Oral TID BM  . ferrous sulfate  325 mg Oral BID WC  . finasteride  5 mg Oral Daily  . latanoprost  1 drop Both Eyes QHS  . losartan  100 mg Oral BID  . omega-3 acid ethyl esters  2 g Oral BID  . pantoprazole  40 mg Oral Daily  . polyethylene glycol  17 g Oral Daily  . senna-docusate  1 tablet Oral BID  . timolol  1 drop Both Eyes Daily    Continuous Infusions: . dextrose 5 % and 0.45% NaCl       LOS: 7 days     Kayleen Memos, MD [facility] Pager [phone]  If 7PM-7AM, please contact night-coverage [email or web address] Password [email or web address] 08/22/2018, 4:25 PM

## 2018-08-23 DIAGNOSIS — E782 Mixed hyperlipidemia: Secondary | ICD-10-CM | POA: Diagnosis not present

## 2018-08-23 DIAGNOSIS — H903 Sensorineural hearing loss, bilateral: Secondary | ICD-10-CM | POA: Diagnosis not present

## 2018-08-23 DIAGNOSIS — R2681 Unsteadiness on feet: Secondary | ICD-10-CM | POA: Diagnosis not present

## 2018-08-23 DIAGNOSIS — S22049D Unspecified fracture of fourth thoracic vertebra, subsequent encounter for fracture with routine healing: Secondary | ICD-10-CM | POA: Diagnosis not present

## 2018-08-23 DIAGNOSIS — S32009A Unspecified fracture of unspecified lumbar vertebra, initial encounter for closed fracture: Secondary | ICD-10-CM | POA: Diagnosis not present

## 2018-08-23 DIAGNOSIS — E860 Dehydration: Secondary | ICD-10-CM | POA: Diagnosis not present

## 2018-08-23 DIAGNOSIS — R1312 Dysphagia, oropharyngeal phase: Secondary | ICD-10-CM | POA: Diagnosis not present

## 2018-08-23 DIAGNOSIS — M546 Pain in thoracic spine: Secondary | ICD-10-CM | POA: Diagnosis not present

## 2018-08-23 DIAGNOSIS — R410 Disorientation, unspecified: Secondary | ICD-10-CM | POA: Diagnosis not present

## 2018-08-23 DIAGNOSIS — G8191 Hemiplegia, unspecified affecting right dominant side: Secondary | ICD-10-CM | POA: Diagnosis not present

## 2018-08-23 DIAGNOSIS — Z743 Need for continuous supervision: Secondary | ICD-10-CM | POA: Diagnosis not present

## 2018-08-23 DIAGNOSIS — E875 Hyperkalemia: Secondary | ICD-10-CM | POA: Diagnosis not present

## 2018-08-23 DIAGNOSIS — R2689 Other abnormalities of gait and mobility: Secondary | ICD-10-CM | POA: Diagnosis not present

## 2018-08-23 DIAGNOSIS — I10 Essential (primary) hypertension: Secondary | ICD-10-CM | POA: Diagnosis not present

## 2018-08-23 DIAGNOSIS — E440 Moderate protein-calorie malnutrition: Secondary | ICD-10-CM | POA: Diagnosis not present

## 2018-08-23 DIAGNOSIS — G9341 Metabolic encephalopathy: Secondary | ICD-10-CM | POA: Diagnosis not present

## 2018-08-23 DIAGNOSIS — I6523 Occlusion and stenosis of bilateral carotid arteries: Secondary | ICD-10-CM | POA: Diagnosis not present

## 2018-08-23 DIAGNOSIS — J181 Lobar pneumonia, unspecified organism: Secondary | ICD-10-CM | POA: Diagnosis not present

## 2018-08-23 DIAGNOSIS — M6281 Muscle weakness (generalized): Secondary | ICD-10-CM | POA: Diagnosis not present

## 2018-08-23 DIAGNOSIS — R41841 Cognitive communication deficit: Secondary | ICD-10-CM | POA: Diagnosis not present

## 2018-08-23 DIAGNOSIS — R279 Unspecified lack of coordination: Secondary | ICD-10-CM | POA: Diagnosis not present

## 2018-08-23 DIAGNOSIS — I712 Thoracic aortic aneurysm, without rupture: Secondary | ICD-10-CM | POA: Diagnosis not present

## 2018-08-23 DIAGNOSIS — M8008XG Age-related osteoporosis with current pathological fracture, vertebra(e), subsequent encounter for fracture with delayed healing: Secondary | ICD-10-CM | POA: Diagnosis not present

## 2018-08-23 DIAGNOSIS — M5489 Other dorsalgia: Secondary | ICD-10-CM | POA: Diagnosis not present

## 2018-08-23 DIAGNOSIS — I69828 Other speech and language deficits following other cerebrovascular disease: Secondary | ICD-10-CM | POA: Diagnosis not present

## 2018-08-23 DIAGNOSIS — E871 Hypo-osmolality and hyponatremia: Secondary | ICD-10-CM | POA: Diagnosis not present

## 2018-08-23 DIAGNOSIS — N401 Enlarged prostate with lower urinary tract symptoms: Secondary | ICD-10-CM | POA: Diagnosis not present

## 2018-08-23 LAB — BASIC METABOLIC PANEL
ANION GAP: 8 (ref 5–15)
BUN: 19 mg/dL (ref 8–23)
CO2: 23 mmol/L (ref 22–32)
CREATININE: 1.02 mg/dL (ref 0.61–1.24)
Calcium: 8.9 mg/dL (ref 8.9–10.3)
Chloride: 99 mmol/L (ref 98–111)
GLUCOSE: 102 mg/dL — AB (ref 70–99)
Potassium: 3.9 mmol/L (ref 3.5–5.1)
SODIUM: 130 mmol/L — AB (ref 135–145)

## 2018-08-23 MED ORDER — POLYETHYLENE GLYCOL 3350 17 G PO PACK: 17.0000 g | PACK | Freq: Every day | ORAL | 0 refills | Status: DC

## 2018-08-23 MED ORDER — CARVEDILOL 12.5 MG PO TABS: 12.5000 mg | ORAL_TABLET | Freq: Two times a day (BID) | ORAL | 0 refills | Status: DC

## 2018-08-23 MED ORDER — LOSARTAN POTASSIUM 100 MG PO TABS: 100.0000 mg | ORAL_TABLET | Freq: Two times a day (BID) | ORAL | 0 refills | Status: DC

## 2018-08-23 MED ORDER — OMEGA-3-ACID ETHYL ESTERS 1 G PO CAPS: 2.0000 g | ORAL_CAPSULE | Freq: Two times a day (BID) | ORAL | 0 refills | Status: DC

## 2018-08-23 MED ORDER — FERROUS SULFATE 325 (65 FE) MG PO TABS: 325.0000 mg | ORAL_TABLET | Freq: Two times a day (BID) | ORAL | 0 refills | Status: DC

## 2018-08-23 MED ORDER — SENNOSIDES-DOCUSATE SODIUM 8.6-50 MG PO TABS
2.0000 | ORAL_TABLET | Freq: Two times a day (BID) | ORAL | Status: DC
  Administered 2018-08-23: 2 via ORAL
  Filled 2018-08-23: qty 2

## 2018-08-23 MED ORDER — ENSURE ENLIVE PO LIQD: 237.0000 mL | Freq: Three times a day (TID) | ORAL | 0 refills | Status: DC

## 2018-08-23 MED ORDER — AMLODIPINE BESYLATE 10 MG PO TABS: 10.0000 mg | ORAL_TABLET | Freq: Every day | ORAL | 0 refills | Status: DC

## 2018-08-23 NOTE — Clinical Social Work Placement (Signed)
Nurse to call report to [phone], Room [place]  NOTE  Date:  08/23/2018  Patient Details  Name: Troy Ray MRN: [id number] Date of Birth: [birth year]  Clinical Social Work is seeking post-discharge placement for this patient at the [place] level of care (*CSW will initial, date and re-position this form in  chart as items are completed):  Yes   Patient/family provided with [facility] Work Department's list of facilities offering this level of care within the geographic area requested by the patient (or if unable, by the patient's family).  Yes   Patient/family informed of their freedom to choose among providers that offer the needed level of care, that participate in Medicare, Medicaid or managed care program needed by the patient, have an available bed and are willing to accept the patient.  Yes   Patient/family informed of [facility]'s ownership interest in [facility] and [facility], as well as of the fact that they are under no obligation to receive care at these facilities.  PASRR submitted to EDS on       PASRR number received on       Existing PASRR number confirmed on       FL2 transmitted to all facilities in geographic area requested by pt/family on       FL2 transmitted to all facilities within larger geographic area on 08/10/18     Patient informed that his/her managed care company has contracts with or will negotiate with certain facilities, including the following:        Yes   Patient/family informed of bed offers received.  Patient chooses bed at [facility]     Physician recommends and patient chooses bed at      Patient to be transferred to [facility] on 08/23/18.  Patient to be transferred to facility by [facility]     Patient family notified on 08/23/18 of transfer.  Name of family member notified:  Wife, Daughter-in-law     PHYSICIAN        Additional Comment:    _______________________________________________ Geralynn Ochs, LCSW 08/23/2018, 1:39 PM

## 2018-08-23 NOTE — Progress Notes (Signed)
Physical Therapy Treatment Patient Details Name: Troy Ray MRN: [id number] DOB: 1928-08-08 Today's Date: 08/23/2018    History of Present Illness 84 yo male admitted with severe back pain. Hx of comp fx-s/p thoracic KP, CAD, CKD, hearing loss.     PT Comments    Pt performed mobility but required significant increased in assistance and unable to progress mobility.  Pt presents with posterior and R lateral lean/push.  Pt also limited due to extremely large BM with multiple transfers to [facility].  Based on functional limitations and continued need for +2 physical assistance SNF remains most appropriate.        Follow Up Recommendations  SNF     Equipment Recommendations  None recommended by PT    Recommendations for Other Services       Precautions / Restrictions Precautions Precautions: Fall Restrictions Weight Bearing Restrictions: No    Mobility  Bed Mobility Overal bed mobility: Needs Assistance Bed Mobility: Rolling Rolling: +2 for physical assistance;Max assist Sidelying to sit: Max assist;+2 for physical assistance       General bed mobility comments: Pt performed bed mobility with increased assistance as her lean laterally to the R and required increased assistance to right in sitting.    Transfers Overall transfer level: Needs assistance Equipment used: Rolling walker (2 wheeled);Ambulation equipment used(performed x3 with RW but leaning R and unable to Right balance.  Performed multiple reps with cues for upper trunk control and weight shifting to the L.  ) Transfers: Sit to/from Stand Sit to Stand: Max assist;+2 physical assistance         General transfer comment: After repeated attempts with RW performed face to face squat pivot from bed to commode.  To stand from commode performed with use of sara stedy.  Pt stood x2 in [place] stedy with posterior push and Lateral lean to the R.    Ambulation/Gait Ambulation/Gait assistance: (NT patient unable to right  balance at this time. )               Stairs             Wheelchair Mobility    Modified Rankin (Stroke Patients Only)       Balance Overall balance assessment: Needs assistance;History of Falls Sitting-balance support: Bilateral upper extremity supported;Feet supported Sitting balance-Leahy Scale: Zero Sitting balance - Comments: Posterior and R lateral lean bias.  Pt presents with pushing making it difficult to maintain balance and progress mobility.                                      Cognition Arousal/Alertness: Awake/alert Behavior During Therapy: WFL for tasks assessed/performed Overall Cognitive Status: Difficult to assess(family reports patients in clearer, but difficult to assess due to [facility].  ) Area of Impairment: Following commands;Safety/judgement;Problem solving                       Following Commands: Follows one step commands inconsistently;Follows one step commands with increased time Safety/Judgement: Decreased awareness of safety;Decreased awareness of deficits     General Comments: He is able to identify family members correctly at bedside.        Exercises      General Comments        Pertinent Vitals/Pain Pain Assessment: 0-10 Pain Score: 6  Pain Location: back Pain Descriptors / Indicators: Grimacing;Moaning Pain Intervention(s): Monitored during session;Repositioned  Home Living                      Prior Function            PT Goals (current goals can now be found in the care plan section) Acute Rehab PT Goals Patient Stated Goal: Pt unable to state due to lethergy/difficult to arouse Potential to Achieve Goals: Good Progress towards PT goals: Progressing toward goals    Frequency    Min 2X/week      PT Plan Current plan remains appropriate;Frequency needs to be updated    Co-evaluation              AM-PAC PT "6 Clicks" Daily Activity  Outcome Measure  Difficulty  turning over in bed (including adjusting bedclothes, sheets and blankets)?: Unable Difficulty moving from lying on back to sitting on the side of the bed? : Unable Difficulty sitting down on and standing up from a chair with arms (e.g., wheelchair, bedside commode, etc,.)?: Unable Help needed moving to and from a bed to chair (including a wheelchair)?: A Lot Help needed walking in hospital room?: A Lot Help needed climbing 3-5 steps with a railing? : Total 6 Click Score: 8    End of Session Equipment Utilized During Treatment: Gait belt Activity Tolerance: Patient tolerated treatment well Patient left: in bed;with call bell/phone within reach;with bed alarm set;with family/visitor present Nurse Communication: Mobility status PT Visit Diagnosis: Muscle weakness (generalized) (M62.81);Difficulty in walking, not elsewhere classified (R26.2);Pain;Unsteadiness on feet (R26.81);History of falling (Z91.81) Pain - part of body: (back)     Time: 7169-6789 PT Time Calculation (min) (ACUTE ONLY): 52 min  Charges:  $Therapeutic Activity: 38-52 mins                     Troy Ray, PTA Acute Rehabilitation Services Pager [phone] Office [phone]     Troy Ray Troy Ray 08/23/2018, 1:33 PM

## 2018-08-23 NOTE — Discharge Instructions (Signed)
Back Pain, Adult Back pain is very common. The pain often gets better over time. The cause of back pain is usually not dangerous. Most people can learn to manage their back pain on their own. Follow these instructions at home: Watch your back pain for any changes. The following actions may help to lessen any pain you are feeling:  Stay active. Start with short walks on flat ground if you can. Try to walk farther each day.  Exercise regularly as told by your doctor. Exercise helps your back heal faster. It also helps avoid future injury by keeping your muscles strong and flexible.  Do not sit, drive, or stand in one place for more than 30 minutes.  Do not stay in bed. Resting more than 1-2 days can slow down your recovery.  Be careful when you bend or lift an object. Use good form when lifting: ? Bend at your knees. ? Keep the object close to your body. ? Do not twist.  Sleep on a firm mattress. Lie on your side, and bend your knees. If you lie on your back, put a pillow under your knees.  Take medicines only as told by your doctor.  Put ice on the injured area. ? Put ice in a plastic bag. ? Place a towel between your skin and the bag. ? Leave the ice on for 20 minutes, 2-3 times a day for the first 2-3 days. After that, you can switch between ice and heat packs.  Avoid feeling anxious or stressed. Find good ways to deal with stress, such as exercise.  Maintain a healthy weight. Extra weight puts stress on your back.  Contact a doctor if:  You have pain that does not go away with rest or medicine.  You have worsening pain that goes down into your legs or buttocks.  You have pain that does not get better in one week.  You have pain at night.  You lose weight.  You have a fever or chills. Get help right away if:  You cannot control when you poop (bowel movement) or pee (urinate).  Your arms or legs feel weak.  Your arms or legs lose feeling (numbness).  You feel sick  to your stomach (nauseous) or throw up (vomit).  You have belly (abdominal) pain.  You feel like you may pass out (faint). This information is not intended to replace advice given to you by your health care provider. Make sure you discuss any questions you have with your health care provider. Document Released: 04/10/2008 Document Revised: 03/30/2016 Document Reviewed: 02/24/2014 Elsevier Interactive Patient Education  2018 [employer].   Preventing Osteoporosis, Adult Osteoporosis is a condition that causes the bones to get weaker. With osteoporosis, the bones become thinner, and the normal spaces in bone tissue become larger. This can make the bones weak and cause them to break more easily. People who have osteoporosis are more likely to break their wrist, spine, or hip. Even a minor accident or injury can be enough to break weak bones. Osteoporosis can occur with aging. Your body constantly replaces old bone tissue with new tissue. As you get older, you may lose bone tissue more quickly, or it may be replaced more slowly. Osteoporosis is more likely to develop if you have poor nutrition or do not get enough calcium or vitamin D. Other lifestyle factors can also play a role. By making some diet and lifestyle changes, you can help to keep your bones healthy and help to prevent  osteoporosis. What nutrition changes can be made? Nutrition plays an important role in maintaining healthy, strong bones.  Make sure you get enough calcium every day from food or from calcium supplements. ? If you are age 37 or younger, aim to get 1,000 mg of calcium every day. ? If you are older than age 36, aim to get 1,200 mg of calcium every day.  Try to get enough vitamin D every day. ? If you are age 50 or younger, aim to get 600 international units (IU) every day. ? If you are older than age 21, aim to get 800 international units every day.  Follow a healthy diet. Eat plenty of foods that contain calcium and  vitamin D. ? Calcium is in milk, cheese, yogurt, and other dairy products. Some fish and vegetables are also good sources of calcium. Many foods such as cereals and breads have had calcium added to them (are fortified). Check nutrition labels to see how much calcium is in a food or drink. ? Foods that contain vitamin D include milk, cereals, salmon, and tuna. Your body also makes vitamin D when you are out in the sun. Bare skin exposure to the sun on your face, arms, legs, or back for no more than 30 minutes a day, 2 times per week is more than enough. Beyond that, it is important to use sunblock to protect your skin from sunburn, which increases your risk for skin cancer.  What lifestyle changes can be made? Making changes in your everyday life can also play an important role in preventing osteoporosis.  Stay active and get exercise every day. Ask your health care provider what types of exercise are best for you.  Do not use any products that contain nicotine or tobacco, such as cigarettes and e-cigarettes. If you need help quitting, ask your health care provider.  Limit alcohol intake to no more than 1 drink a day for nonpregnant women and 2 drinks a day for men. One drink equals 12 oz of beer, 5 oz of wine, or 1 oz of hard liquor.  Why are these changes important? Making these nutrition and lifestyle changes can:  Help you develop and maintain healthy, strong bones.  Prevent loss of bone mass and the problems that are caused by that loss, such as broken bones and delayed healing.  Make you feel better mentally and physically.  What can happen if changes are not made? Problems that can result from osteoporosis can be very serious. These may include:  A higher risk of broken bones that are painful and do not heal well.  Physical malformations, such as a collapsed spine or a hunched back.  Problems with movement.  Where to find support: If you need help making changes to prevent  osteoporosis, talk with your health care provider. You can ask for a referral to a diet and nutrition specialist (dietitian) and a physical therapist. Where to find more information: Learn more about osteoporosis from:  NIH Osteoporosis and Related [place]: www.niams.GolfingGoddess.com.br  [place] Office on [place]: SouvenirBaseball.es.html  National Osteoporosis Foundation: ProfilePeek.ch  Summary  Osteoporosis is a condition that causes weak bones that are more likely to break.  Eating a healthy diet and making sure you get enough calcium and vitamin D can help prevent osteoporosis.  Other ways to reduce your risk of osteoporosis include getting regular exercise and avoiding alcohol and products that contain nicotine or tobacco. This information is not intended to replace advice  given to you by your health care provider. Make sure you discuss any questions you have with your health care provider. Document Released: 11/07/2015 Document Revised: 07/03/2016 Document Reviewed: 07/03/2016 Elsevier Interactive Patient Education  2018 [place] in the Home Falls can cause injuries. They can happen to people of all ages. There are many things you can do to make your home safe and to help prevent falls. What can I do on the outside of my home?  Regularly fix the edges of walkways and driveways and fix any cracks.  Remove anything that might make you trip as you walk through a door, such as a raised step or threshold.  Trim any bushes or trees on the path to your home.  Use bright outdoor lighting.  Clear any walking paths of anything that might make someone trip, such as rocks or tools.  Regularly check to see if handrails are loose or broken. Make sure that both sides of any steps have handrails.  Any raised  decks and porches should have guardrails on the edges.  Have any leaves, snow, or ice cleared regularly.  Use sand or salt on walking paths during winter.  Clean up any spills in your garage right away. This includes oil or grease spills. What can I do in the bathroom?  Use night lights.  Install grab bars by the toilet and in the tub and shower. Do not use towel bars as grab bars.  Use non-skid mats or decals in the tub or shower.  If you need to sit down in the shower, use a plastic, non-slip stool.  Keep the floor dry. Clean up any water that spills on the floor as soon as it happens.  Remove soap buildup in the tub or shower regularly.  Attach bath mats securely with double-sided non-slip rug tape.  Do not have throw rugs and other things on the floor that can make you trip. What can I do in the bedroom?  Use night lights.  Make sure that you have a light by your bed that is easy to reach.  Do not use any sheets or blankets that are too big for your bed. They should not hang down onto the floor.  Have a firm chair that has side arms. You can use this for support while you get dressed.  Do not have throw rugs and other things on the floor that can make you trip. What can I do in the kitchen?  Clean up any spills right away.  Avoid walking on wet floors.  Keep items that you use a lot in easy-to-reach places.  If you need to reach something above you, use a strong step stool that has a grab bar.  Keep electrical cords out of the way.  Do not use floor polish or wax that makes floors slippery. If you must use wax, use non-skid floor wax.  Do not have throw rugs and other things on the floor that can make you trip. What can I do with my stairs?  Do not leave any items on the stairs.  Make sure that there are handrails on both sides of the stairs and use them. Fix handrails that are broken or loose. Make sure that handrails are as long as the stairways.  Check  any carpeting to make sure that it is firmly attached to the stairs. Fix any carpet that is loose or worn.  Avoid having throw rugs at the top or bottom  of the stairs. If you do have throw rugs, attach them to the floor with carpet tape.  Make sure that you have a light switch at the top of the stairs and the bottom of the stairs. If you do not have them, ask someone to add them for you. What else can I do to help prevent falls?  Wear shoes that: ? Do not have high heels. ? Have rubber bottoms. ? Are comfortable and fit you well. ? Are closed at the toe. Do not wear sandals.  If you use a stepladder: ? Make sure that it is fully opened. Do not climb a closed stepladder. ? Make sure that both sides of the stepladder are locked into place. ? Ask someone to hold it for you, if possible.  Clearly mark and make sure that you can see: ? Any grab bars or handrails. ? First and last steps. ? Where the edge of each step is.  Use tools that help you move around (mobility aids) if they are needed. These include: ? Canes. ? Walkers. ? Scooters. ? Crutches.  Turn on the lights when you go into a dark area. Replace any light bulbs as soon as they burn out.  Set up your furniture so you have a clear path. Avoid moving your furniture around.  If any of your floors are uneven, fix them.  If there are any pets around you, be aware of where they are.  Review your medicines with your doctor. Some medicines can make you feel dizzy. This can increase your chance of falling. Ask your doctor what other things that you can do to help prevent falls. This information is not intended to replace advice given to you by your health care provider. Make sure you discuss any questions you have with your health care provider. Document Released: 08/19/2009 Document Revised: 03/30/2016 Document Reviewed: 11/27/2014 Elsevier Interactive Patient Education  2018 [place] in [facility]als,  Adult WHAT ARE SOME SAFETY TIPS FOR PREVENTING FALLS? If you or a loved one has to stay in the hospital, talk with the health care providers about the risk of falling. Find out which medicines or treatments can cause dizziness or affect balance. Make a plan with the health care providers to prevent falls. The plan may include these points:  Ask for help moving around, especially after surgery or when feeling unwell.  Have support available when getting up and moving around.  Wear nonskid footwear.  Use the safety straps on wheelchairs.  Ask for help to get objects that are out of reach.  Wear eyeglasses.  Remove all clutter from the floor and the sides of the bed.  Keep equipment and wires securely out of the way.  Keep the bed locked in the low position.  Keep the side rails up on the bed.  Keep the nurse call button within reach.  Keep the door open when no one else is in the room.  Have someone stay in the hospital with you or your loved one.  Ask for a bed alarm if you are not able to stay with your loved one who is at risk for getting up without help.  Ask if sleeping pills or other medicines that alter mental status are necessary.  WHAT INCREASES THE RISK FOR FALLS? Certain conditions and treatments may increase a patient's risk for falls in a hospital. These include:  Being in an unfamiliar environment.  Being on bed rest.  Having surgery.  Taking certain medicines, such as sleeping pills.  Having tubes in place, such as IV lines or catheters.  Additional risk factors for falls in a hospital include:  Having vision problems.  Having a change in thinking, feeling, or behavior (altered mental status).  Having trouble with balance.  Needing to use the toilet frequently.  Having fallen in the past three months.  Having low blood pressure when standing up quickly (orthostatic hypotension).  WHAT DOES THE HOSPITAL STAFF DO TO HELP PREVENT ME OR MY LOVED  ONE FROM FALLING? Hospitals have systems in place to prevent falls and accidents. Talk with the hospital staff about:  Doing an assessment to discuss fall risks and create a personalized plan to prevent falls.  Checking in regularly to see if help is needed for moving around and to assess any changes in fall risk.  Knowing where the nurse call button is and how to use it. Use this to call a nursing care provider any time.  Keeping personal items within reach. This includes eyeglasses, phones, and other electronic devices.  Following general safety guidelines when moving around.  Keeping the area around the bed free from clutter.  Removing unnecessary equipment or tubes to reduce the risk of tripping.  Using safety equipment, such as: ? Walkers, crutches, and other walking devices for support. ? Safety rails on the bed. ? Safety straps in the bed. ? A bed that can be lowered and locked to prevent movement. ? Handrails in the bathroom. ? Nonskid socks and shoes. ? Locking mechanisms to secure equipment in place. ? Lifting and transfer equipment.  WHAT CAN I DO TO HELP PREVENT A FALL?  Talk with health care providers about fall prevention.  Have a personalized fall prevention plan in place.  Do not try to move around if you feel off balance or ill.  Change position slowly.  Sit on the side of the bed before standing up.  Sit down and call for help if you feel dizzy or unsteady when standing.  Keep the hospital room clear of clutter.  WHEN SHOULD I ASK FOR HELP? Ask for help whenever you:  Are not sure if you are able to move around safely.  Feel dizzy or unsteady.  Are not comfortable helping your loved one move around or use the bathroom.  If you or a loved one falls, tell the hospital staff. This is important. This information is not intended to replace advice given to you by your health care provider. Make sure you discuss any questions you have with your health  care provider. Document Released: 10/20/2000 Document Revised: 03/21/2016 Document Reviewed: 08/05/2015 Elsevier Interactive Patient Education  2017 [employer].

## 2018-08-23 NOTE — Plan of Care (Signed)
  Problem: Education: Goal: Knowledge of General Education information will improve Description Including pain rating scale, medication(s)/side effects and non-pharmacologic comfort measures Outcome: Adequate for Discharge   Problem: Health Behavior/Discharge Planning: Goal: Ability to manage health-related needs will improve Outcome: Adequate for Discharge   Problem: Clinical Measurements: Goal: Ability to maintain clinical measurements within normal limits will improve Outcome: Adequate for Discharge Goal: Will remain free from infection Outcome: Adequate for Discharge Goal: Diagnostic test results will improve Outcome: Adequate for Discharge Goal: Respiratory complications will improve Outcome: Adequate for Discharge Goal: Cardiovascular complication will be avoided Outcome: Adequate for Discharge   Problem: Activity: Goal: Risk for activity intolerance will decrease Outcome: Adequate for Discharge   Problem: Nutrition: Goal: Adequate nutrition will be maintained Outcome: Adequate for Discharge   Problem: Elimination: Goal: Will not experience complications related to bowel motility Outcome: Adequate for Discharge Goal: Will not experience complications related to urinary retention Outcome: Adequate for Discharge   Problem: Safety: Goal: Ability to remain free from injury will improve Outcome: Adequate for Discharge   Problem: Skin Integrity: Goal: Risk for impaired skin integrity will decrease Outcome: Adequate for Discharge   Problem: Education: Goal: Knowledge of disease or condition will improve Outcome: Adequate for Discharge Goal: Knowledge of secondary prevention will improve Outcome: Adequate for Discharge Goal: Knowledge of patient specific risk factors addressed and post discharge goals established will improve Outcome: Adequate for Discharge   Problem: Education: Goal: Knowledge of disease or condition will improve Outcome: Adequate for  Discharge Goal: Knowledge of patient specific risk factors addressed and post discharge goals established will improve Outcome: Adequate for Discharge Goal: Knowledge of secondary prevention will improve Outcome: Adequate for Discharge Goal: Individualized Educational Video(s) Outcome: Adequate for Discharge   Problem: Coping: Goal: Will verbalize positive feelings about self Outcome: Adequate for Discharge Goal: Will identify appropriate support needs Outcome: Adequate for Discharge   Problem: Health Behavior/Discharge Planning: Goal: Ability to manage health-related needs will improve Outcome: Adequate for Discharge   Problem: Self-Care: Goal: Ability to participate in self-care as condition permits will improve Outcome: Adequate for Discharge Goal: Verbalization of feelings and concerns over difficulty with self-care will improve Outcome: Adequate for Discharge Goal: Ability to communicate needs accurately will improve Outcome: Adequate for Discharge   Problem: Nutrition: Goal: Risk of aspiration will decrease Outcome: Adequate for Discharge Goal: Dietary intake will improve Outcome: Adequate for Discharge   Problem: Ischemic Stroke/TIA Tissue Perfusion: Goal: Complications of ischemic stroke/TIA will be minimized Outcome: Adequate for Discharge

## 2018-08-23 NOTE — Discharge Summary (Addendum)
Discharge Summary  Troy Ray ZDG:[id number] DOB: 14-Sep-1928  PCP: [name], MD  Admit date: 08/08/2018 Discharge date: 08/23/2018  Time spent: 35 minutes   Recommendations for Outpatient Follow-up:  1. Follow-up with your PCP 2. Follow-up with neurology 3. Take your medications as prescribed 4. Continue physical therapy at SNF 5. Fall precautions 6. Repeat noncontrast chest CT in 12 months due to mild thrombus/plaque descending thoracic aorta aneurysm measuring 3.4 cm (follow-up with your PCP)  Discharge Diagnoses:  Active Hospital Problems   Diagnosis Date Noted  . Acute metabolic encephalopathy 95/18/8416  . Pressure injury of skin 08/14/2018  . Back pain 08/09/2018  . Vertebral fracture, osteoporotic ([place]) 07/23/2018  . Leukocytosis 09/12/2017  . BPH (benign prostatic hyperplasia) 07/14/2013  . Hyperlipidemia, mixed 12/07/2008  . Essential hypertension 12/07/2008  . Coronary atherosclerosis 12/07/2008    Resolved Hospital Problems  No resolved problems to display.    Discharge Condition: Stable  Diet recommendation: Resume previous diet  Vitals:   08/23/18 0741 08/23/18 1213  BP: (!) 143/52 (!) 133/51  Pulse: 69 (!) 55  Resp: 15 17  Temp: 97.7 F (36.5 C) 98.2 F (36.8 C)  SpO2: 98% 98%    History of present illness:  84 yo M w/ a hx ofCAD, stage 3 CKD, HTN, prior T4/T5 vertebroplasty, BPH, diverticulosis, and HLD who was initially admitted to [facility] from [facility] with worsening back pain for 1 month. An extensive work up with CT angio of the chest abdomen and pelvis revealed no acute changes in the vertebra or new fractures.  While an inpatient he developed agitation and was given IV Haldol 2 mg once. He then developed slurred speech, weakness, lethargy and confusion about 3 hours later that raised concern for an acute CVA. CT head and CTA head and neck did not reveal acute findings to explain his symptoms. He was transferred to [facility]. MRI  brain without acute finding to explain his symptoms. Echocardiogram basically normal. Chest x-ray showed a possible RLL infiltrate.  Completed course of IV ceftriaxone and azithromycin.  Hospital course complicated by altered mental status with lethargy.  Per the patient's son he is lucid and conversational at his baseline.  Repeated CT head with no contrast yesterday 08/22/2018.  This was unremarkable for any acute intracranial findings.  DC'd Seroquel and added IV fluid.    08/23/2018: Patient seen and examined at his bedside.  He is more alert and oriented x2.  Is also interactive.  Does not appear to be in any acute distress.  On the day of discharge, the patient was hemodynamically stable.  He will need to follow-up with his primary care provider and continue physical therapy at SNF posthospitalization.    Hospital Course:  Active Problems:   Hyperlipidemia, mixed   Essential hypertension   Coronary atherosclerosis   BPH (benign prostatic hyperplasia)   Leukocytosis   Vertebral fracture, osteoporotic ([facility])   Back pain   Pressure injury of skin   Acute metabolic encephalopathy  Resolving acute metabolic encephalopathy suspect delirium from unfamiliar environment Per the patient's son at his baseline he is lucid and conversational MRI brain no significant acute findings TSH, folate, UA, ammonia, B12 all normal Repeated CT head with no contrast done on 08/22/2018 and independently reviewed revealed no acute intracranial findings Avoid sedatives and dehydration DC'd Seroquel and melatonin Reorient as needed  Moderate protein calorie malnutrition/poor oral intake/ Albumin 2.7 Nutritionist followed Continue oral supplement between meals Received gentle IV fluid hydration D5  half normal at 75 cc/h Avoid dehydration  Resolved hyperkalemia Post treatment with calcium gluconate IV 10 milligrams once, 10 unit of IV insulin, D50, Kayexalate.  Improving hypovolemic  hyponatremia Suspect secondary to poor oral intake Encourage patient to eat  Chronic back pain status post vertebroplasty No new findings on imaging Secondary to old fracture at T4 and T5 Will DC to SNF for physical rehab  Continue physical therapy  Mild thrombus/plaque at descending thoracic aorta 3.4 cm aneurysm Noted on CTA chest Will need surveillance outpatient Follow-up with your PCP  Resolving right lower lobe infiltrate concerning for community-acquired pneumonia Completed course of IV ceftriaxone and azithromycin.     Code Status: DNR   Consultants:  None  Procedures:  None  Antimicrobials:  None   Discharge Exam: BP (!) 133/51 (BP Location: Left Arm)   Pulse (!) 55   Temp 98.2 F (36.8 C) (Axillary)   Resp 17   Ht 5\' 9"  (1.753 m)   Wt 68.9 kg   SpO2 98%   BMI 22.43 kg/m  . General: 84 y.o. year-old male frail in no acute distress.  Alert and oriented x2. . Cardiovascular: Regular rate and rhythm with no rubs or gallops.  No thyromegaly or JVD noted.   [name] Kitchen Respiratory: Clear to auscultation with no wheezes or rales. Good inspiratory effort. . Abdomen: Soft nontender nondistended with normal bowel sounds x4 quadrants. . Musculoskeletal: No lower extremity edema. 2/4 pulses in all 4 extremities. [name] Kitchen Psychiatry: Mood is appropriate for condition and setting  Discharge Instructions You were cared for by a hospitalist during your hospital stay. If you have any questions about your discharge medications or the care you received while you were in the hospital after you are discharged, you can call the unit and asked to speak with the hospitalist on call if the hospitalist that took care of you is not available. Once you are discharged, your primary care physician will handle any further medical issues. Please note that NO REFILLS for any discharge medications will be authorized once you are discharged, as it is imperative that you return to your  primary care physician (or establish a relationship with a primary care physician if you do not have one) for your aftercare needs so that they can reassess your need for medications and monitor your lab values.  Discharge Instructions    Ambulatory referral to Neurology   Complete by:  As directed    An appointment is requested in approximately: 4 weeks. Pt has been dr. Felecia Shelling pt.     Allergies as of 08/23/2018   No Known Allergies     Medication List    STOP taking these medications   metoprolol succinate 50 MG 24 hr tablet Commonly known as:  TOPROL-XL   predniSONE 10 MG tablet Commonly known as:  DELTASONE   tiZANidine 2 MG tablet Commonly known as:  ZANAFLEX     TAKE these medications   amLODipine 10 MG tablet Commonly known as:  NORVASC Take 1 tablet (10 mg total) by mouth daily. Start taking on:  08/24/2018 What changed:    medication strength  how much to take  when to take this   aspirin EC 81 MG tablet Take 81 mg by mouth at bedtime.   atorvastatin 40 MG tablet Commonly known as:  LIPITOR TAKE 1 TABLET EVERY DAY   carvedilol 12.5 MG tablet Commonly known as:  COREG Take 1 tablet (12.5 mg total) by mouth 2 (two) times daily with a  meal.   feeding supplement (ENSURE ENLIVE) Liqd Take 237 mLs by mouth 3 (three) times daily between meals.   ferrous sulfate 325 (65 FE) MG tablet Take 1 tablet (325 mg total) by mouth 2 (two) times daily with a meal.   finasteride 5 MG tablet Commonly known as:  PROSCAR TAKE 1 TABLET EVERY DAY   latanoprost 0.005 % ophthalmic solution Commonly known as:  XALATAN Place 1 drop into both eyes at bedtime.   losartan 100 MG tablet Commonly known as:  COZAAR Take 1 tablet (100 mg total) by mouth 2 (two) times daily. What changed:    medication strength  how much to take   omega-3 acid ethyl esters 1 g capsule Commonly known as:  LOVAZA Take 2 capsules (2 g total) by mouth 2 (two) times daily.   pantoprazole 40  MG tablet Commonly known as:  PROTONIX Take 1 tablet (40 mg total) by mouth every morning.   polyethylene glycol packet Commonly known as:  MIRALAX / GLYCOLAX Take 17 g by mouth daily. Start taking on:  08/24/2018   timolol 0.5 % ophthalmic solution Commonly known as:  TIMOPTIC Place 1 drop into both eyes daily.      No Known Allergies  Contact information for follow-up providers    Sater, Nanine Means, MD. Schedule an appointment as soon as possible for a visit in 4 week(s).   Specialty:  Neurology Contact information: [place] [address] [phone]        [name], MD. Call in 1 day(s).   Specialty:  Family Medicine Why:  Please call for a post hospital follow-up appointment. Contact information: [place] [address] [phone]            Contact information for after-discharge care    Destination    [facility] Preferred SNF .   Service:  Skilled Nursing Contact information: [address] [place] [phone]                   The results of significant diagnostics from this hospitalization (including imaging, microbiology, ancillary and laboratory) are listed below for reference.    Significant Diagnostic Studies: Ct Angio Head W Or Wo Contrast  Result Date: 08/13/2018 CLINICAL DATA:  Mental status change. Rule out stroke. Left-sided weakness and slurred speech EXAM: CT ANGIOGRAPHY HEAD AND NECK TECHNIQUE: Multidetector CT imaging of the head and neck was performed using the standard protocol during bolus administration of intravenous contrast. Multiplanar CT image reconstructions and MIPs were obtained to evaluate the vascular anatomy. Carotid stenosis measurements (when applicable) are obtained utilizing NASCET criteria, using the distal internal carotid diameter as the denominator. CONTRAST:  158mL ISOVUE-370 IOPAMIDOL (ISOVUE-370) INJECTION 76%  COMPARISON:  CT head 08/13/2018 FINDINGS: CTA NECK FINDINGS Aortic arch: Advanced atherosclerotic disease aortic arch. Marked luminal narrowing of the descending thoracic aorta due to mural clot. No acute dissection flap. No change from recent chest CT of 08/08/2018 Right carotid system: Right common carotid artery widely patent. Atherosclerotic calcification of the right carotid bifurcation. 50% diameter stenosis proximal right internal carotid artery. Left carotid system: Mild atherosclerotic disease left common carotid artery. Atherosclerotic plaque in the proximal left internal carotid artery. 40% diameter stenosis of the left internal carotid artery above the bifurcation. Vertebral arteries: Severe stenosis of the origin of the left vertebral artery which arises from the aortic arch. Calcific plaque at the origin of the right  vertebral artery with mild narrowing. Right vertebral artery dominant. Both vertebral arteries patent to the basilar. Skeleton: Cervical spondylosis. Compression fractures with vertebroplasty cement at T6 and T7. Other neck: Negative Upper chest: Lung apices are clear.  Motion degraded images. Review of the MIP images confirms the above findings CTA HEAD FINDINGS Anterior circulation: Atherosclerotic calcification throughout the cavernous carotid bilaterally with mild stenosis bilaterally. Anterior and middle cerebral arteries patent without stenosis or branch occlusion Posterior circulation: Right vertebral artery dominant. Both vertebral arteries patent to the basilar. Left PICA patent. Right PICA not visualized. Right AICA may supply this territory. Basilar patent. Superior cerebellar and posterior cerebral arteries patent bilaterally patent posterior communicating artery on the right. Venous sinuses: Patent Anatomic variants: None Delayed phase: Not performed Review of the MIP images confirms the above findings IMPRESSION: 1. 50% diameter stenosis proximal right internal carotid  artery. 2. 40% diameter stenosis proximal left internal carotid artery 3. Mild stenosis origin of the right vertebral artery and severe stenosis origin of left vertebral artery. Left vertebral artery origin at the arch. 4. Mild stenosis in the cavernous carotid bilaterally. Negative for emergent large vessel occlusion. 5. Severe atherosclerotic disease in the thoracic aorta with luminal narrowing and marked mural thrombus or plaque in the descending thoracic aorta. Electronically Signed   By: Franchot Gallo M.D.   On: 08/13/2018 20:17   Dg Chest 2 View  Result Date: 08/14/2018 CLINICAL DATA:  Cough EXAM: CHEST - 2 VIEW COMPARISON:  08/08/2018 chest radiograph. FINDINGS: Intact sternotomy wires. Stable cardiomediastinal silhouette with normal heart size. No pneumothorax. Left lower lobe consolidation with small left pleural effusion. No right pleural effusion. IMPRESSION: 1. Left lower lobe consolidation suggesting pneumonia. Follow-up chest radiographs recommended to document resolution. 2. Small left pleural effusion. Electronically Signed   By: Ilona Sorrel M.D.   On: 08/14/2018 18:21   Dg Chest 2 View  Result Date: 08/08/2018 CLINICAL DATA:  Fall this morning. EXAM: CHEST - 2 VIEW COMPARISON:  Chest radiograph 08/20/2014 FINDINGS: Post median sternotomy. Unchanged heart size and mediastinal contours. Retrocardiac hiatal hernia. There is aortic atherosclerosis. Left lung base atelectasis or scarring. No confluent airspace disease. No pulmonary edema. No large pleural effusion or pneumothorax. No visualized rib fracture. Kyphoplasty within mid upper thoracic vertebra. IMPRESSION: 1. No acute or traumatic findings. 2. Hiatal hernia. Electronically Signed   By: Keith Rake M.D.   On: 08/08/2018 05:45   Dg Thoracic Spine 2 View  Result Date: 08/08/2018 CLINICAL DATA:  84 y/o M; chronic back pain and bilateral leg weakness for 2 weeks. EXAM: THORACIC SPINE 2 VIEWS COMPARISON:  08/08/2018 chest  radiograph. FINDINGS: Stable T5-6 chronic compression deformities post kyphoplasty. No acute fracture or malalignment. Aortic calcific atherosclerosis. Normal thoracic kyphosis without listhesis. Discogenic degenerative changes with small endplate marginal osteophytes. IMPRESSION: Stable T5-6 chronic compression deformities post kyphoplasty. No acute osseous abnormality identified. Electronically Signed   By: Kristine Garbe M.D.   On: 08/08/2018 06:12   Dg Lumbar Spine Complete  Result Date: 08/08/2018 CLINICAL DATA:  Fall.  Chronic back pain.  Bilateral leg weakness. EXAM: LUMBAR SPINE - COMPLETE 4+ VIEW COMPARISON:  Lumbar spine MRI 05/04/2018 FINDINGS: There are 6 non-rib-bearing lumbar vertebra. The alignment is maintained. Vertebral body heights are normal. There is no listhesis. The posterior elements are intact. No fracture. Endplate spurring at multiple levels with minor disc space narrowing L2-L3 and L1-L2. Facet arthropathy in the lower lumbar spine. Sacroiliac joints are congruent. IMPRESSION: Degenerative change in the lumbar spine  without acute fracture. Electronically Signed   By: Keith Rake M.D.   On: 08/08/2018 06:13   Ct Head Wo Contrast  Result Date: 08/22/2018 CLINICAL DATA:  Altered mental status EXAM: CT HEAD WITHOUT CONTRAST TECHNIQUE: Contiguous axial images were obtained from the base of the skull through the vertex without intravenous contrast. COMPARISON:  Head CT 08/13/2018 FINDINGS: Brain: There is no mass, hemorrhage or extra-axial collection. There is generalized atrophy without lobar predilection. There is no acute or chronic infarction. There is hypoattenuation of the periventricular white matter, most commonly indicating chronic ischemic microangiopathy. Vascular: No abnormal hyperdensity of the major intracranial arteries or dural venous sinuses. No intracranial atherosclerosis. Skull: The visualized skull base, calvarium and extracranial soft tissues are  normal. Sinuses/Orbits: No fluid levels or advanced mucosal thickening of the visualized paranasal sinuses. No mastoid or middle ear effusion. The orbits are normal. IMPRESSION: Chronic small vessel ischemia and atrophy without acute abnormality. Electronically Signed   By: Ulyses Jarred M.D.   On: 08/22/2018 15:47   Ct Angio Neck W Or Wo Contrast  Result Date: 08/13/2018 CLINICAL DATA:  Mental status change. Rule out stroke. Left-sided weakness and slurred speech EXAM: CT ANGIOGRAPHY HEAD AND NECK TECHNIQUE: Multidetector CT imaging of the head and neck was performed using the standard protocol during bolus administration of intravenous contrast. Multiplanar CT image reconstructions and MIPs were obtained to evaluate the vascular anatomy. Carotid stenosis measurements (when applicable) are obtained utilizing NASCET criteria, using the distal internal carotid diameter as the denominator. CONTRAST:  158mL ISOVUE-370 IOPAMIDOL (ISOVUE-370) INJECTION 76% COMPARISON:  CT head 08/13/2018 FINDINGS: CTA NECK FINDINGS Aortic arch: Advanced atherosclerotic disease aortic arch. Marked luminal narrowing of the descending thoracic aorta due to mural clot. No acute dissection flap. No change from recent chest CT of 08/08/2018 Right carotid system: Right common carotid artery widely patent. Atherosclerotic calcification of the right carotid bifurcation. 50% diameter stenosis proximal right internal carotid artery. Left carotid system: Mild atherosclerotic disease left common carotid artery. Atherosclerotic plaque in the proximal left internal carotid artery. 40% diameter stenosis of the left internal carotid artery above the bifurcation. Vertebral arteries: Severe stenosis of the origin of the left vertebral artery which arises from the aortic arch. Calcific plaque at the origin of the right vertebral artery with mild narrowing. Right vertebral artery dominant. Both vertebral arteries patent to the basilar. Skeleton: Cervical  spondylosis. Compression fractures with vertebroplasty cement at T6 and T7. Other neck: Negative Upper chest: Lung apices are clear.  Motion degraded images. Review of the MIP images confirms the above findings CTA HEAD FINDINGS Anterior circulation: Atherosclerotic calcification throughout the cavernous carotid bilaterally with mild stenosis bilaterally. Anterior and middle cerebral arteries patent without stenosis or branch occlusion Posterior circulation: Right vertebral artery dominant. Both vertebral arteries patent to the basilar. Left PICA patent. Right PICA not visualized. Right AICA may supply this territory. Basilar patent. Superior cerebellar and posterior cerebral arteries patent bilaterally patent posterior communicating artery on the right. Venous sinuses: Patent Anatomic variants: None Delayed phase: Not performed Review of the MIP images confirms the above findings IMPRESSION: 1. 50% diameter stenosis proximal right internal carotid artery. 2. 40% diameter stenosis proximal left internal carotid artery 3. Mild stenosis origin of the right vertebral artery and severe stenosis origin of left vertebral artery. Left vertebral artery origin at the arch. 4. Mild stenosis in the cavernous carotid bilaterally. Negative for emergent large vessel occlusion. 5. Severe atherosclerotic disease in the thoracic aorta with luminal narrowing and marked  mural thrombus or plaque in the descending thoracic aorta. Electronically Signed   By: Franchot Gallo M.D.   On: 08/13/2018 20:17   Mr Brain Wo Contrast  Result Date: 08/14/2018 CLINICAL DATA:  Slurred speech and weakness, beginning at 4:15 p.m. EXAM: MRI HEAD WITHOUT CONTRAST TECHNIQUE: Multiplanar, multiecho pulse sequences of the brain and surrounding structures were obtained without intravenous contrast. COMPARISON:  CTA head neck 08/13/2018 FINDINGS: BRAIN: There is no acute infarct, acute hemorrhage, hydrocephalus or extra-axial collection. The midline  structures are normal. No midline shift or other mass effect. Diffuse confluent hyperintense T2-weighted signal within the periventricular, deep and juxtacortical white matter, most commonly due to chronic ischemic microangiopathy. Generalized atrophy without lobar predilection. Susceptibility-sensitive sequences show no chronic microhemorrhage or superficial siderosis. VASCULAR: Major intracranial arterial and venous sinus flow voids are normal. SKULL AND UPPER CERVICAL SPINE: Calvarial bone marrow signal is normal. There is no skull base mass. Visualized upper cervical spine and soft tissues are normal. SINUSES/ORBITS: Small amount of bilateral mastoid fluid. Paranasal sinuses are clear. There are bilateral lens replacements. IMPRESSION: 1. No acute intracranial abnormality. 2. Advanced atrophy and chronic ischemic microangiopathic changes. Electronically Signed   By: Ulyses Jarred M.D.   On: 08/14/2018 02:52   Ct Angio Chest/abd/pel For Dissection W And/or Wo Contrast  Result Date: 08/08/2018 CLINICAL DATA:  Chest pain, back pain, leg weakness. EXAM: CT ANGIOGRAPHY CHEST, ABDOMEN AND PELVIS TECHNIQUE: Multidetector CT imaging through the chest, abdomen and pelvis was performed using the standard protocol during bolus administration of intravenous contrast. Multiplanar reconstructed images and MIPs were obtained and reviewed to evaluate the vascular anatomy. CONTRAST:  13mL ISOVUE-370 IOPAMIDOL (ISOVUE-370) INJECTION 76% COMPARISON:  CT abdomen and pelvis 09/14/2017 FINDINGS: CTA CHEST FINDINGS Cardiovascular: There is marked luminal narrowing within the proximal descending thoracic aorta. Slight dilatation of the descending thoracic aorta in this area. This measures maximally 3.4 cm in the mid descending thoracic aorta. The marked luminal narrowing could be due to thrombosed chronic dissection or severe irregular plaque formation. Heart is normal size. Mediastinum/Nodes: No mediastinal, hilar, or axillary  adenopathy. Large hiatal hernia. Lungs/Pleura: Small subpleural nodule in the right upper lobe measures 3-4 mm on image 25. Compressive atelectasis in the left lower lobe adjacent to the hiatal hernia. No effusions or confluent airspace opacities. Musculoskeletal: Chest wall soft tissues are unremarkable. No acute bony abnormality. Compression deformities at T5 and T6 with prior vertebroplasty changes. Review of the MIP images confirms the above findings. CTA ABDOMEN AND PELVIS FINDINGS VASCULAR Aorta: Markedly irregular plaque formation throughout the abdominal aorta. No aneurysm. Maximum diameter 2.7 cm distally. Changes of remote bypass from the distal aorta to the external iliac arteries. Celiac: Widely patent SMA: Irregular plaque formation and luminal narrowing noted in the proximal superior mesenteric artery. Patent. Luminal narrowing is likely greater than 50%. Renals: Moderate narrowing of the left renal artery, likely 50% or greater. No significant stenosis on the right. IMA: Occluded at its origin with reconstitution. Inflow: Aorto bi-iliac bypass widely patent. Native common iliac arteries occluded. Veins: Grossly unremarkable. Review of the MIP images confirms the above findings. NON-VASCULAR Hepatobiliary: No focal hepatic abnormality. Gallbladder unremarkable. Pancreas: No focal abnormality or ductal dilatation. Spleen: No focal abnormality.  Normal size. Adrenals/Urinary Tract: No adrenal mass. Small cyst in the midpole of the left kidney. No hydronephrosis. Urinary bladder grossly unremarkable. Stomach/Bowel: Sigmoid diverticulosis. No active diverticulitis. No evidence of bowel obstruction. Lymphatic: No adenopathy Reproductive: Prostate enlargement with central calcifications. Other: No free fluid or  free air. Musculoskeletal: Degenerative changes in the lumbar spine. No acute bony abnormality. Review of the MIP images confirms the above findings. IMPRESSION: Marked luminal irregularity and  narrowing within the proximal and mid descending thoracic aorta. Aneurysm at this level mildly aneurysmal at 3.4 cm. The luminal narrowing could be related to markedly irregular plaque formation or chronic thrombosed dissection. Luminal irregularity in the abdominal aorta without aneurysm. Prior aortoiliac bypass from the distal aorta to the external iliac arteries. Small subpleural nodule in the right upper lobe, 3-4 mm. No follow-up needed if patient is low-risk. Non-contrast chest CT can be considered in 12 months if patient is high-risk. This recommendation follows the consensus statement: Guidelines for Management of Incidental Pulmonary Nodules Detected on CT Images: From the Fleischner Society 2017; Radiology 2017; 284:228-243. Large hiatal hernia. Sigmoid diverticulosis.  No active diverticulitis. Prostate enlargement. Electronically Signed   By: Rolm Baptise M.D.   On: 08/08/2018 08:25   Ct Head Code Stroke Wo Contrast  Result Date: 08/13/2018 CLINICAL DATA:  Code stroke.  Slurred speech ataxia EXAM: CT HEAD WITHOUT CONTRAST TECHNIQUE: Contiguous axial images were obtained from the base of the skull through the vertex without intravenous contrast. COMPARISON:  MRI 05/20/2017 FINDINGS: Brain: Moderate atrophy. Moderate chronic microvascular ischemic change in the white matter. Negative for acute infarct, hemorrhage, or mass. Vascular: Negative for hyperdense vessel Skull: Negative Sinuses/Orbits: Negative Other: None ASPECTS ([place] Stroke Program Early CT Score) - Ganglionic level infarction (caudate, lentiform nuclei, internal capsule, insula, M1-M3 cortex): 7 - Supraganglionic infarction (M4-M6 cortex): 3 Total score (0-10 with 10 being normal): 10 IMPRESSION: 1. No acute intracranial abnormality. 2. ASPECTS is 10 3. Moderate atrophy and moderate chronic microvascular ischemic change in the white matter. 4. Electronically Signed   By: Franchot Gallo M.D.   On: 08/13/2018 18:53    Microbiology: No  results found for this or any previous visit (from the past 240 hour(s)).   Labs: Basic Metabolic Panel: Recent Labs  Lab 08/17/18 0354 08/18/18 0523 08/21/18 0756 08/21/18 1919 08/22/18 0631 08/23/18 0345  NA 134* 133* 130*  --  129* 130*  K 4.4 4.5 5.1 6.2* 4.7 3.9  CL 107 103 101  --  99 99  CO2 21* 21* 23  --  23 23  GLUCOSE 99 105* 114*  --  112* 102*  BUN 11 14 18   --  17 19  CREATININE 0.86 0.89 0.98  --  0.95 1.02  CALCIUM 8.4* 8.3* 8.6*  --  8.7* 8.9  MG 1.9  --   --   --   --   --    Liver Function Tests: Recent Labs  Lab 08/17/18 0354 08/18/18 0523 08/22/18 0631  AST 26 23 17   ALT 17 15 16   ALKPHOS 50 48 60  BILITOT 0.6 0.7 0.6  PROT 5.5* 5.5* 6.0*  ALBUMIN 2.5* 2.5* 2.7*   No results for input(s): LIPASE, AMYLASE in the last 168 hours. Recent Labs  Lab 08/17/18 0413  AMMONIA 15   CBC: Recent Labs  Lab 08/17/18 0354 08/21/18 0756 08/22/18 0631  WBC 13.2* 12.8* 11.3*  NEUTROABS  --   --  7.4  HGB 11.6* 11.0* 11.5*  HCT 35.9* 34.6* 36.6*  MCV 89.5 90.1 88.8  PLT 246 314 348   Cardiac Enzymes: No results for input(s): CKTOTAL, CKMB, CKMBINDEX, TROPONINI in the last 168 hours. BNP: BNP (last 3 results) No results for input(s): BNP in the last 8760 hours.  ProBNP (last 3 results)  No results for input(s): PROBNP in the last 8760 hours.  CBG: Recent Labs  Lab 08/22/18 0708  GLUCAP 94       Signed:  Kayleen Memos, MD [facility] 08/23/2018, 1:11 PM

## 2018-08-26 ENCOUNTER — Encounter: Payer: Self-pay | Admitting: Internal Medicine

## 2018-08-26 ENCOUNTER — Non-Acute Institutional Stay (SKILLED_NURSING_FACILITY): Payer: Medicare HMO | Admitting: Internal Medicine

## 2018-08-26 DIAGNOSIS — G9341 Metabolic encephalopathy: Secondary | ICD-10-CM

## 2018-08-26 DIAGNOSIS — E782 Mixed hyperlipidemia: Secondary | ICD-10-CM | POA: Diagnosis not present

## 2018-08-26 DIAGNOSIS — J189 Pneumonia, unspecified organism: Secondary | ICD-10-CM

## 2018-08-26 DIAGNOSIS — E875 Hyperkalemia: Secondary | ICD-10-CM

## 2018-08-26 DIAGNOSIS — M546 Pain in thoracic spine: Secondary | ICD-10-CM | POA: Diagnosis not present

## 2018-08-26 DIAGNOSIS — E440 Moderate protein-calorie malnutrition: Secondary | ICD-10-CM | POA: Diagnosis not present

## 2018-08-26 DIAGNOSIS — I712 Thoracic aortic aneurysm, without rupture: Secondary | ICD-10-CM

## 2018-08-26 DIAGNOSIS — I7123 Aneurysm of the descending thoracic aorta, without rupture: Secondary | ICD-10-CM

## 2018-08-26 DIAGNOSIS — I10 Essential (primary) hypertension: Secondary | ICD-10-CM

## 2018-08-26 DIAGNOSIS — J181 Lobar pneumonia, unspecified organism: Secondary | ICD-10-CM | POA: Diagnosis not present

## 2018-08-26 DIAGNOSIS — I6523 Occlusion and stenosis of bilateral carotid arteries: Secondary | ICD-10-CM

## 2018-08-26 NOTE — Progress Notes (Signed)
:  Location:  [place] Room Number: [id number] Place of Service:  SNF (31)  Noah Delaine. Sheppard Coil, MD  Patient Care Team: [name], MD as PCP - General (Family Medicine) Calvert Cantor, MD as Consulting Physician (Ophthalmology)  Extended Emergency Contact Information Primary Emergency Contact: [place] Phone: [phone] Mobile Phone: [phone] Relation: Son Secondary Emergency Contact: Pavlovic,Louella Address: [place], [place] Phone: [phone] Relation: Spouse     Allergies: Patient has no known allergies.  Chief Complaint  Patient presents with  . New Admit To SNF    Admit to [facility]    HPI: Patient is 84 y.o. male with CAD, chronic kidney disease stage III, hypertension, prior T4/T5 vertebroplasty, BPH, diverticulosis, and hyperlipidemia who was admitted initially to [facility] long hospital from [facility] with worsening back pain for 1 month.  Extensive work-up with CT angios of the chest abdomen and pelvis were revealed no changes in the vertebral or new fractures.  She was admitted to [facility] from 10/3-18 where he was treated for acute metabolic encephalopathy, right lower lobe pneumonia, and chronic back pain.  Patient is admitted to skilled nursing facility for OT/PT.  While at skilled nursing facility patient will be followed for hypertension treated with Norvasc Coreg, hyperlipidemia treated with Lipitor and coronary artery disease treated with aspirin Coreg and statin.  Past Medical History:  Diagnosis Date  . Adenomatous colon polyp   . BPH (benign prostatic hyperplasia) 07/14/2013  . CAD (coronary artery disease)   . Carotid stenosis, bilateral    right 60-79% stenosed.  Left 40-59%  . Chronic kidney disease    chronic, stage II  . CRI (chronic renal insufficiency)   . Diverticulosis   . Dysphagia   . Esophageal  stricture    Dr [name]  . Glaucoma   . Headache(784.0) 12/01/2013  . Hyperlipidemia   . Hypertension   . Insomnia 05/12/2015  . Low back pain syndrome 04/03/2014  . Medicare annual wellness visit, subsequent 02/04/2015  . Memory loss   . Postural lightheadedness   . PVD (peripheral vascular disease) ([place])   . Renal insufficiency   . Sensorineural hearing loss, bilateral   . Unspecified constipation 12/29/2012    Past Surgical History:  Procedure Laterality Date  . ABDOMINAL AORTIC ANEURYSM REPAIR  1996   Dr [name]  . CORONARY ARTERY BYPASS GRAFT    . EYE SURGERY     cataracts b/l and laser  . HERNIA REPAIR  03/23/05   left inguinal Lichtenstein repair, with mesh.  [name]  . IR [facility] THORACIC WITH BONE BIOPSY  11/08/2017  . IR RADIOLOGIST EVAL & MGMT  10/22/2017  . IR VERTEBROPLASTY CERV/THOR BX INC UNI/BIL INC/INJECT/IMAGING  06/25/2018  . RENAL ARTERY STENT  1996   Dr [name].  Bilateral stenting    Allergies as of 08/26/2018   No Known Allergies     Medication List        Accurate as of 08/26/18 11:16 AM. Always use your most recent med list.          amLODipine 10 MG tablet Commonly known as:  NORVASC Take 1 tablet (10 mg total) by mouth daily.   aspirin EC 81 MG tablet Take 81 mg by mouth at bedtime.   atorvastatin 40 MG tablet Commonly known as:  LIPITOR TAKE  1 TABLET EVERY DAY   carvedilol 12.5 MG tablet Commonly known as:  COREG Take 1 tablet (12.5 mg total) by mouth 2 (two) times daily with a meal.   feeding supplement (ENSURE ENLIVE) Liqd Take 237 mLs by mouth 3 (three) times daily between meals.   ferrous sulfate 325 (65 FE) MG tablet Take 1 tablet (325 mg total) by mouth 2 (two) times daily with a meal.   finasteride 5 MG tablet Commonly known as:  PROSCAR TAKE 1 TABLET EVERY DAY   latanoprost 0.005 % ophthalmic solution Commonly known as:  XALATAN Place 1 drop into both eyes at bedtime.   losartan 100 MG tablet Commonly known as:   COZAAR Take 1 tablet (100 mg total) by mouth 2 (two) times daily.   omega-3 acid ethyl esters 1 g capsule Commonly known as:  LOVAZA Take 2 capsules (2 g total) by mouth 2 (two) times daily.   pantoprazole 40 MG tablet Commonly known as:  PROTONIX Take 1 tablet (40 mg total) by mouth every morning.   polyethylene glycol packet Commonly known as:  MIRALAX / GLYCOLAX Take 17 g by mouth daily.   timolol 0.5 % ophthalmic solution Commonly known as:  TIMOPTIC Place 1 drop into both eyes daily.       No orders of the defined types were placed in this encounter.   Immunization History  Administered Date(s) Administered  . Influenza Whole 08/17/2008, 09/20/2009, 08/23/2010  . Influenza, High Dose Seasonal PF 07/29/2015, 06/29/2016, 08/16/2017, 07/23/2018  . Influenza,inj,Quad PF,6+ Mos 07/11/2013, 08/13/2014  . Pneumococcal Conjugate-13 08/13/2014  . Pneumococcal Polysaccharide-23 11/20/2006, 09/30/2012  . Zoster 09/22/2010    Social History   Tobacco Use  . Smoking status: Former Research scientist (life sciences)  . Smokeless tobacco: Never Used  . Tobacco comment: quit 1980  Substance Use Topics  . Alcohol use: Yes    Alcohol/week: 1.0 standard drinks    Types: 1 Glasses of wine per week    Family history is   Family History  Problem Relation Age of Onset  . Heart disease Father        CAD  . Coronary artery disease Father   . Stroke Father   . Hearing loss Father   . Hearing loss Paternal Grandfather   . Heart disease Son   . Heart disease Brother        MI  . Cancer Neg Hx   . Diabetes Neg Hx       Review of Systems  DATA OBTAINED: from patient-limited; nursing no acute concerns GENERAL:  no fevers, fatigue, appetite changes SKIN: No itching, or rash EYES: No eye pain, redness, discharge EARS: No earache, tinnitus, change in hearing NOSE: No congestion, drainage or bleeding  MOUTH/THROAT: No mouth or tooth pain, No sore throat RESPIRATORY: No cough, wheezing, SOB CARDIAC:  No chest pain, palpitations, lower extremity edema  GI: No abdominal pain, No N/V/D or constipation, No heartburn or reflux  GU: No dysuria, frequency or urgency, or incontinence  MUSCULOSKELETAL: No unrelieved bone/joint pain NEUROLOGIC: No headache, dizziness or focal weakness PSYCHIATRIC: No c/o anxiety or sadness   Vitals:   08/26/18 1114  BP: (!) 153/50  Pulse: 86  Resp: 18  Temp: 97.9 F (36.6 C)    SpO2 Readings from Last 1 Encounters:  08/23/18 98%   Body mass index is 20.48 kg/m.     Physical Exam  GENERAL APPEARANCE: Alert, conversant,  No acute distress.  SKIN: No diaphoresis rash HEAD: Normocephalic, atraumatic  EYES:  Conjunctiva/lids clear. Pupils round, reactive. EOMs intact.  EARS: External exam WNL, canals clear. Hearing grossly normal.  NOSE: No deformity or discharge.  MOUTH/THROAT: Lips w/o lesions  RESPIRATORY: Breathing is even, unlabored. Lung sounds are clear   CARDIOVASCULAR: Heart RRR no murmurs, rubs or gallops. No peripheral edema.   GASTROINTESTINAL: Abdomen is soft, non-tender, not distended w/ normal bowel sounds. GENITOURINARY: Bladder non tender, not distended  MUSCULOSKELETAL: No abnormal joints or musculature NEUROLOGIC:  Cranial nerves 2-12 grossly intact. Moves all extremities  PSYCHIATRIC: Mood and affect vague, no behavioral issues  Patient Active Problem List   Diagnosis Date Noted  . Acute metabolic encephalopathy 86/76/1948  . Pressure injury of skin 08/14/2018  . Back pain 08/09/2018  . Vertebral fracture, osteoporotic ([place]) 07/23/2018  . T6 vertebral fracture ([place]) 09/12/2017  . Leukocytosis 09/12/2017  . Sun-damaged skin 06/27/2017  . Gait disturbance 05/08/2017  . Low back pain 05/08/2017  . Anorexia 12/12/2015  . Skin lesion of left arm 05/12/2015  . Gastroesophageal reflux disease without esophagitis 05/12/2015  . Insomnia 05/12/2015  . Preventative health care 02/04/2015  . Glaucoma 01/25/2015  . Left-sided low  back pain with left-sided sciatica 04/03/2014  . Apnea 01/25/2014  . Headache 12/01/2013  . Sinusitis 10/13/2013  . BPH (benign prostatic hyperplasia) 07/14/2013  . Constipation 12/29/2012  . LACK OF COORDINATION 11/17/2010  . Hyperglycemia 11/17/2010  . PERSONAL HISTORY OF COLONIC POLYPS 09/21/2009  . Carotid artery disease ([place]) 05/11/2009  . MEMORY LOSS 04/06/2009  . Hyperlipidemia, mixed 12/07/2008  . Essential hypertension 12/07/2008  . Coronary atherosclerosis 12/07/2008  . History of AAA (abdominal aortic aneurysm) repair 12/04/2008  . CHRONIC KIDNEY DISEASE STAGE II (MILD) 12/04/2008  . ESOPHAGEAL STRICTURE 05/29/2008  . OTHER DYSPHAGIA 05/29/2008      Labs reviewed: Basic Metabolic Panel:    Component Value Date/Time   NA 130 (L) 08/23/2018 0345   K 3.9 08/23/2018 0345   CL 99 08/23/2018 0345   CO2 23 08/23/2018 0345   GLUCOSE 102 (H) 08/23/2018 0345   BUN 19 08/23/2018 0345   CREATININE 1.02 08/23/2018 0345   CREATININE 1.25 (H) 12/10/2015 1611   CALCIUM 8.9 08/23/2018 0345   PROT 6.0 (L) 08/22/2018 0631   ALBUMIN 2.7 (L) 08/22/2018 0631   AST 17 08/22/2018 0631   ALT 16 08/22/2018 0631   ALKPHOS 60 08/22/2018 0631   BILITOT 0.6 08/22/2018 0631   GFRNONAA >60 08/23/2018 0345   GFRNONAA 52 (L) 02/21/2011 1720   GFRAA >60 08/23/2018 0345   GFRAA >60 02/21/2011 1720    Recent Labs    08/17/18 0354  08/21/18 0756 08/21/18 1919 08/22/18 0631 08/23/18 0345  NA 134*   < > 130*  --  129* 130*  K 4.4   < > 5.1 6.2* 4.7 3.9  CL 107   < > 101  --  99 99  CO2 21*   < > 23  --  23 23  GLUCOSE 99   < > 114*  --  112* 102*  BUN 11   < > 18  --  17 19  CREATININE 0.86   < > 0.98  --  0.95 1.02  CALCIUM 8.4*   < > 8.6*  --  8.7* 8.9  MG 1.9  --   --   --   --   --    < > = values in this interval not displayed.   Liver Function Tests: Recent Labs    08/17/18 0354 08/18/18 9326  08/22/18 0631  AST 26 23 17   ALT 17 15 16   ALKPHOS 50 48 60  BILITOT 0.6  0.7 0.6  PROT 5.5* 5.5* 6.0*  ALBUMIN 2.5* 2.5* 2.7*   No results for input(s): LIPASE, AMYLASE in the last 8760 hours. Recent Labs    08/17/18 0413  AMMONIA 15   CBC: Recent Labs    08/08/18 0504 08/14/18 1258  08/17/18 0354 08/21/18 0756 08/22/18 0631  WBC 14.7* 13.2*   < > 13.2* 12.8* 11.3*  NEUTROABS 10.1* 10.1*  --   --   --  7.4  HGB 13.0 10.9*   < > 11.6* 11.0* 11.5*  HCT 40.0 33.0*   < > 35.9* 34.6* 36.6*  MCV 88.3 87.3   < > 89.5 90.1 88.8  PLT 218 215   < > 246 314 348   < > = values in this interval not displayed.   Lipid Recent Labs    12/06/17 1510 08/14/18 1008  CHOL 126 115  HDL 40.80 35*  LDLCALC 47 66  TRIG 192.0* 69    Cardiac Enzymes: Recent Labs    08/08/18 0504 08/08/18 0710  TROPONINI 0.03* 0.03*   BNP: No results for input(s): BNP in the last 8760 hours. Lab Results  Component Value Date   MICROALBUR 0.87 10/06/2013   Lab Results  Component Value Date   HGBA1C 5.7 (H) 08/14/2018   Lab Results  Component Value Date   TSH 3.513 08/14/2018   Lab Results  Component Value Date   VITAMINB12 405 08/17/2018   Lab Results  Component Value Date   FOLATE 7.5 08/17/2018   Lab Results  Component Value Date   IRON 25 (L) 08/17/2018   TIBC 185 (L) 08/17/2018   FERRITIN 231 08/17/2018    Imaging and Procedures obtained prior to SNF admission: Dg Chest 2 View  Result Date: 08/08/2018 CLINICAL DATA:  Fall this morning. EXAM: CHEST - 2 VIEW COMPARISON:  Chest radiograph 08/20/2014 FINDINGS: Post median sternotomy. Unchanged heart size and mediastinal contours. Retrocardiac hiatal hernia. There is aortic atherosclerosis. Left lung base atelectasis or scarring. No confluent airspace disease. No pulmonary edema. No large pleural effusion or pneumothorax. No visualized rib fracture. Kyphoplasty within mid upper thoracic vertebra. IMPRESSION: 1. No acute or traumatic findings. 2. Hiatal hernia. Electronically Signed   By: Keith Rake  M.D.   On: 08/08/2018 05:45   Dg Thoracic Spine 2 View  Result Date: 08/08/2018 CLINICAL DATA:  84 y/o M; chronic back pain and bilateral leg weakness for 2 weeks. EXAM: THORACIC SPINE 2 VIEWS COMPARISON:  08/08/2018 chest radiograph. FINDINGS: Stable T5-6 chronic compression deformities post kyphoplasty. No acute fracture or malalignment. Aortic calcific atherosclerosis. Normal thoracic kyphosis without listhesis. Discogenic degenerative changes with small endplate marginal osteophytes. IMPRESSION: Stable T5-6 chronic compression deformities post kyphoplasty. No acute osseous abnormality identified. Electronically Signed   By: Kristine Garbe M.D.   On: 08/08/2018 06:12   Dg Lumbar Spine Complete  Result Date: 08/08/2018 CLINICAL DATA:  Fall.  Chronic back pain.  Bilateral leg weakness. EXAM: LUMBAR SPINE - COMPLETE 4+ VIEW COMPARISON:  Lumbar spine MRI 05/04/2018 FINDINGS: There are 6 non-rib-bearing lumbar vertebra. The alignment is maintained. Vertebral body heights are normal. There is no listhesis. The posterior elements are intact. No fracture. Endplate spurring at multiple levels with minor disc space narrowing L2-L3 and L1-L2. Facet arthropathy in the lower lumbar spine. Sacroiliac joints are congruent. IMPRESSION: Degenerative change in the lumbar spine without acute fracture. Electronically Signed  By: Keith Rake M.D.   On: 08/08/2018 06:13   Ct Angio Chest/abd/pel For Dissection W And/or Wo Contrast  Result Date: 08/08/2018 CLINICAL DATA:  Chest pain, back pain, leg weakness. EXAM: CT ANGIOGRAPHY CHEST, ABDOMEN AND PELVIS TECHNIQUE: Multidetector CT imaging through the chest, abdomen and pelvis was performed using the standard protocol during bolus administration of intravenous contrast. Multiplanar reconstructed images and MIPs were obtained and reviewed to evaluate the vascular anatomy. CONTRAST:  164mL ISOVUE-370 IOPAMIDOL (ISOVUE-370) INJECTION 76% COMPARISON:  CT abdomen  and pelvis 09/14/2017 FINDINGS: CTA CHEST FINDINGS Cardiovascular: There is marked luminal narrowing within the proximal descending thoracic aorta. Slight dilatation of the descending thoracic aorta in this area. This measures maximally 3.4 cm in the mid descending thoracic aorta. The marked luminal narrowing could be due to thrombosed chronic dissection or severe irregular plaque formation. Heart is normal size. Mediastinum/Nodes: No mediastinal, hilar, or axillary adenopathy. Large hiatal hernia. Lungs/Pleura: Small subpleural nodule in the right upper lobe measures 3-4 mm on image 25. Compressive atelectasis in the left lower lobe adjacent to the hiatal hernia. No effusions or confluent airspace opacities. Musculoskeletal: Chest wall soft tissues are unremarkable. No acute bony abnormality. Compression deformities at T5 and T6 with prior vertebroplasty changes. Review of the MIP images confirms the above findings. CTA ABDOMEN AND PELVIS FINDINGS VASCULAR Aorta: Markedly irregular plaque formation throughout the abdominal aorta. No aneurysm. Maximum diameter 2.7 cm distally. Changes of remote bypass from the distal aorta to the external iliac arteries. Celiac: Widely patent SMA: Irregular plaque formation and luminal narrowing noted in the proximal superior mesenteric artery. Patent. Luminal narrowing is likely greater than 50%. Renals: Moderate narrowing of the left renal artery, likely 50% or greater. No significant stenosis on the right. IMA: Occluded at its origin with reconstitution. Inflow: Aorto bi-iliac bypass widely patent. Native common iliac arteries occluded. Veins: Grossly unremarkable. Review of the MIP images confirms the above findings. NON-VASCULAR Hepatobiliary: No focal hepatic abnormality. Gallbladder unremarkable. Pancreas: No focal abnormality or ductal dilatation. Spleen: No focal abnormality.  Normal size. Adrenals/Urinary Tract: No adrenal mass. Small cyst in the midpole of the left  kidney. No hydronephrosis. Urinary bladder grossly unremarkable. Stomach/Bowel: Sigmoid diverticulosis. No active diverticulitis. No evidence of bowel obstruction. Lymphatic: No adenopathy Reproductive: Prostate enlargement with central calcifications. Other: No free fluid or free air. Musculoskeletal: Degenerative changes in the lumbar spine. No acute bony abnormality. Review of the MIP images confirms the above findings. IMPRESSION: Marked luminal irregularity and narrowing within the proximal and mid descending thoracic aorta. Aneurysm at this level mildly aneurysmal at 3.4 cm. The luminal narrowing could be related to markedly irregular plaque formation or chronic thrombosed dissection. Luminal irregularity in the abdominal aorta without aneurysm. Prior aortoiliac bypass from the distal aorta to the external iliac arteries. Small subpleural nodule in the right upper lobe, 3-4 mm. No follow-up needed if patient is low-risk. Non-contrast chest CT can be considered in 12 months if patient is high-risk. This recommendation follows the consensus statement: Guidelines for Management of Incidental Pulmonary Nodules Detected on CT Images: From the Fleischner Society 2017; Radiology 2017; 284:228-243. Large hiatal hernia. Sigmoid diverticulosis.  No active diverticulitis. Prostate enlargement. Electronically Signed   By: Rolm Baptise M.D.   On: 08/08/2018 08:25     Not all labs, radiology exams or other studies done during hospitalization come through on my EPIC note; however they are reviewed by me.    Assessment and Plan  Acute metabolic encephalopathy-suspect delirium from unfamiliar environment;  per patient son patient is at his baseline; MRI brain with no acute findings, TSH, folate, UA, ammonia, B12 all normal; patient Seroquel and melatonin were DC'd; recommended to avoid sedatives and dehydration SNF- admitted for OT/PT  Right lower lobe infiltrate/community-acquired pneumonia- patient completed a  course of IV Rocephin and Zithromax  Moderate protein calorie malnutrition/poor oral intake/hyponatremia -albumin 2.7; patient was hydrated with D5 half-normal saline at 75 cc an hour perhaps contributing to patient's hyponatremia SNF- patient will be seen by dietary; there will be protein supplements added to diet  Hyperkalemia-patient was treated with calcium gluconate IV, 10 units of IV insulin with D50 and Kayexalate SNF- we will follow-up BMP  Chronic back pain status post vertebroplasty-no new findings SNF-patient is admitted for PT OT for back pain  Descending thoracic aneurysm-3.4 cm SNF- surveillance as outpatient with PCP  Hypertension SNF- not well controlled on initial read but will follow for several days before any medication changes are made; continue Norvasc 10 mg daily Coreg 12.5 mg twice daily  Coronary artery disease SNF- no complaints of chest pain or equivalent; continue Coreg 12.5 mg twice daily aspirin 81 mg daily, patient is on statin  Hyperlipidemia SNF- not stated as uncontrolled; patient on high dose with Lipitor 40 mg daily     Time spent greater than 45 minutes;> 50% of time with patient was spent reviewing records, labs, tests and studies, counseling and developing plan of care  Webb Silversmith D. Sheppard Coil, MD

## 2018-08-27 ENCOUNTER — Ambulatory Visit: Payer: Medicare HMO | Admitting: Family Medicine

## 2018-08-28 LAB — CBC AND DIFFERENTIAL
HEMATOCRIT: 40 — AB (ref 41–53)
Hemoglobin: 13.6 (ref 13.5–17.5)
PLATELETS: 357 (ref 150–399)
WBC: 13.1

## 2018-08-28 LAB — BASIC METABOLIC PANEL
BUN: 24 — AB (ref 4–21)
Creatinine: 1 (ref 0.6–1.3)
GLUCOSE: 79
Potassium: 4.6 (ref 3.4–5.3)
Sodium: 126 — AB (ref 137–147)

## 2018-08-31 ENCOUNTER — Encounter: Payer: Self-pay | Admitting: Internal Medicine

## 2018-08-31 DIAGNOSIS — E875 Hyperkalemia: Secondary | ICD-10-CM | POA: Insufficient documentation

## 2018-08-31 DIAGNOSIS — I7123 Aneurysm of the descending thoracic aorta, without rupture: Secondary | ICD-10-CM | POA: Insufficient documentation

## 2018-08-31 DIAGNOSIS — I712 Thoracic aortic aneurysm, without rupture: Secondary | ICD-10-CM | POA: Insufficient documentation

## 2018-08-31 DIAGNOSIS — E440 Moderate protein-calorie malnutrition: Secondary | ICD-10-CM | POA: Insufficient documentation

## 2018-09-02 LAB — BASIC METABOLIC PANEL
BUN: 37 — AB (ref 4–21)
CREATININE: 1.4 — AB (ref 0.6–1.3)
Glucose: 79
POTASSIUM: 4.5 (ref 3.4–5.3)
SODIUM: 136 — AB (ref 137–147)

## 2018-09-02 LAB — CBC AND DIFFERENTIAL
HCT: 33 — AB (ref 41–53)
Hemoglobin: 11.1 — AB (ref 13.5–17.5)
Platelets: 278 (ref 150–399)
WBC: 13.4

## 2018-09-12 DIAGNOSIS — H903 Sensorineural hearing loss, bilateral: Secondary | ICD-10-CM | POA: Diagnosis not present

## 2018-09-18 ENCOUNTER — Encounter: Payer: Self-pay | Admitting: Internal Medicine

## 2018-09-18 ENCOUNTER — Non-Acute Institutional Stay (SKILLED_NURSING_FACILITY): Payer: Medicare HMO | Admitting: Internal Medicine

## 2018-09-18 DIAGNOSIS — I10 Essential (primary) hypertension: Secondary | ICD-10-CM | POA: Diagnosis not present

## 2018-09-18 DIAGNOSIS — J189 Pneumonia, unspecified organism: Secondary | ICD-10-CM

## 2018-09-18 DIAGNOSIS — I712 Thoracic aortic aneurysm, without rupture: Secondary | ICD-10-CM

## 2018-09-18 DIAGNOSIS — I6523 Occlusion and stenosis of bilateral carotid arteries: Secondary | ICD-10-CM

## 2018-09-18 DIAGNOSIS — E782 Mixed hyperlipidemia: Secondary | ICD-10-CM | POA: Diagnosis not present

## 2018-09-18 DIAGNOSIS — E440 Moderate protein-calorie malnutrition: Secondary | ICD-10-CM | POA: Diagnosis not present

## 2018-09-18 DIAGNOSIS — E871 Hypo-osmolality and hyponatremia: Secondary | ICD-10-CM | POA: Insufficient documentation

## 2018-09-18 DIAGNOSIS — M8008XG Age-related osteoporosis with current pathological fracture, vertebra(e), subsequent encounter for fracture with delayed healing: Secondary | ICD-10-CM | POA: Diagnosis not present

## 2018-09-18 DIAGNOSIS — J181 Lobar pneumonia, unspecified organism: Secondary | ICD-10-CM

## 2018-09-18 DIAGNOSIS — G9341 Metabolic encephalopathy: Secondary | ICD-10-CM | POA: Diagnosis not present

## 2018-09-18 DIAGNOSIS — I7123 Aneurysm of the descending thoracic aorta, without rupture: Secondary | ICD-10-CM

## 2018-09-18 DIAGNOSIS — E875 Hyperkalemia: Secondary | ICD-10-CM

## 2018-09-18 MED ORDER — FERROUS SULFATE 325 (65 FE) MG PO TABS: 325.0000 mg | ORAL_TABLET | Freq: Two times a day (BID) | ORAL | 0 refills | Status: AC

## 2018-09-18 MED ORDER — ATORVASTATIN CALCIUM 40 MG PO TABS: 40.0000 mg | ORAL_TABLET | Freq: Every day | ORAL | 0 refills | Status: AC

## 2018-09-18 MED ORDER — SODIUM BICARBONATE 650 MG PO TABS: 650.0000 mg | ORAL_TABLET | Freq: Three times a day (TID) | ORAL | 0 refills | Status: AC

## 2018-09-18 MED ORDER — FINASTERIDE 5 MG PO TABS: 5.0000 mg | ORAL_TABLET | Freq: Every day | ORAL | 0 refills | Status: AC

## 2018-09-18 MED ORDER — PANTOPRAZOLE SODIUM 40 MG PO TBEC: 40.0000 mg | DELAYED_RELEASE_TABLET | ORAL | 2 refills | Status: AC

## 2018-09-18 MED ORDER — TIMOLOL MALEATE 0.5 % OP SOLN: 1.0000 [drp] | Freq: Every day | OPHTHALMIC | 0 refills | Status: AC

## 2018-09-18 MED ORDER — LATANOPROST 0.005 % OP SOLN: 1.0000 [drp] | Freq: Every day | OPHTHALMIC | 0 refills | Status: AC

## 2018-09-18 MED ORDER — OMEGA-3-ACID ETHYL ESTERS 1 G PO CAPS: 2.0000 g | ORAL_CAPSULE | Freq: Two times a day (BID) | ORAL | 0 refills | Status: AC

## 2018-09-18 MED ORDER — ENSURE ENLIVE PO LIQD: 237.0000 mL | Freq: Three times a day (TID) | ORAL | 0 refills | Status: AC

## 2018-09-18 MED ORDER — LOSARTAN POTASSIUM 100 MG PO TABS: 100.0000 mg | ORAL_TABLET | Freq: Two times a day (BID) | ORAL | 0 refills | Status: AC

## 2018-09-18 MED ORDER — AMLODIPINE BESYLATE 10 MG PO TABS: 10.0000 mg | ORAL_TABLET | Freq: Every day | ORAL | 0 refills | Status: AC

## 2018-09-18 MED ORDER — POLYETHYLENE GLYCOL 3350 17 G PO PACK: 17.0000 g | PACK | Freq: Every day | ORAL | 0 refills | Status: AC

## 2018-09-18 MED ORDER — ASPIRIN EC 81 MG PO TBEC: 81.0000 mg | DELAYED_RELEASE_TABLET | Freq: Every day | ORAL | 0 refills | Status: AC

## 2018-09-18 MED ORDER — CARVEDILOL 12.5 MG PO TABS: 12.5000 mg | ORAL_TABLET | Freq: Two times a day (BID) | ORAL | 0 refills | Status: AC

## 2018-09-18 NOTE — Progress Notes (Signed)
Location:  [place] Room Number: [phone] Place of Service:  SNF [phone])  Troy Ray. Sheppard Coil, MD  Patient Care Team: [name], MD as PCP - General (Family Medicine) Calvert Cantor, MD as Consulting Physician (Ophthalmology)  Extended Emergency Contact Information Primary Emergency Contact: [place] Phone: [phone] Mobile Phone: [phone] Relation: Son Secondary Emergency Contact: Winberry,Louella Address: [place], [place] Phone: [phone] Relation: Spouse  No Known Allergies  Chief Complaint  Patient presents with  . Discharge Note    Discharge from [facility]    HPI:  84 y.o. male with CAD, chronic kidney disease stage III, hypertension, prior T4/T5 vertebroplasty, BPH, diverticulosis, and hyperlipidemia who was admitted initially to [facility] from [facility] with worsening back pain for 1 month.  Extensive work-up with CT NGO of the chest, abdomen and pelvis were done and revealed no changes in the vertebra are no new fractures.  Patient was admitted to [facility] from 10/3-18 where he was treated for acute metabolic encephalopathy, right lower lobe pneumonia, and chronic back pain.  Patient was admitted to skilled nursing facility for OT/PT and is now ready to be discharged to home.    Past Medical History:  Diagnosis Date  . Adenomatous colon polyp   . BPH (benign prostatic hyperplasia) 07/14/2013  . CAD (coronary artery disease)   . Carotid stenosis, bilateral    right 60-79% stenosed.  Left 40-59%  . Chronic kidney disease    chronic, stage II  . CRI (chronic renal insufficiency)   . Diverticulosis   . Dysphagia   . Esophageal stricture    Dr [name]  . Glaucoma   . Headache(784.0) 12/01/2013  . Hyperlipidemia   . Hypertension   . Insomnia 05/12/2015  . Low back pain syndrome 04/03/2014  . Medicare annual  wellness visit, subsequent 02/04/2015  . Memory loss   . Postural lightheadedness   . PVD (peripheral vascular disease) ([place])   . Renal insufficiency   . Sensorineural hearing loss, bilateral   . Unspecified constipation 12/29/2012    Past Surgical History:  Procedure Laterality Date  . ABDOMINAL AORTIC ANEURYSM REPAIR  1996   Dr [name]  . CORONARY ARTERY BYPASS GRAFT    . EYE SURGERY     cataracts b/l and laser  . HERNIA REPAIR  03/23/05   left inguinal Lichtenstein repair, with mesh.  [name]  . IR [facility] THORACIC WITH BONE BIOPSY  11/08/2017  . IR RADIOLOGIST EVAL & MGMT  10/22/2017  . IR VERTEBROPLASTY CERV/THOR BX INC UNI/BIL INC/INJECT/IMAGING  06/25/2018  . RENAL ARTERY STENT  1996   Dr [name].  Bilateral stenting     reports that he has quit smoking. He has never used smokeless tobacco. He reports that he drinks about 1.0 standard drinks of alcohol per week. He reports that he does not use drugs. Social History   Socioeconomic History  . Marital status: Married    Spouse name: Not on file  . Number of children: 3  . Years of education: Not on file  . Highest education level: Not on file  Occupational History  . Occupation: MEDIA ASSISTANT    Employer: [employer]  Social Needs  . Financial resource strain: Not on file  . Food insecurity:    Worry: Not on file  Inability: Not on file  . Transportation needs:    Medical: Not on file    Non-medical: Not on file  Tobacco Use  . Smoking status: Former Research scientist (life sciences)  . Smokeless tobacco: Never Used  . Tobacco comment: quit 1980  Substance and Sexual Activity  . Alcohol use: Yes    Alcohol/week: 1.0 standard drinks    Types: 1 Glasses of wine per week  . Drug use: No  . Sexual activity: Yes    Comment: lives with wife, retired, no dietary restriction  Lifestyle  . Physical activity:    Days per week: Not on file    Minutes per session: Not on file  . Stress: Not on file  Relationships  . Social  connections:    Talks on phone: Not on file    Gets together: Not on file    Attends religious service: Not on file    Active member of club or organization: Not on file    Attends meetings of clubs or organizations: Not on file    Relationship status: Not on file  . Intimate partner violence:    Fear of current or ex partner: Not on file    Emotionally abused: Not on file    Physically abused: Not on file    Forced sexual activity: Not on file  Other Topics Concern  . Not on file  Social History Narrative  . Not on file    Pertinent  Health Maintenance Due  Topic Date Due  . INFLUENZA VACCINE  Completed  . PNA vac Low Risk Adult  Completed    Medications: Allergies as of 09/18/2018   No Known Allergies     Medication List        Accurate as of 09/18/18  5:23 PM. Always use your most recent med list.          amLODipine 10 MG tablet Commonly known as:  NORVASC Take 1 tablet (10 mg total) by mouth daily.   aspirin EC 81 MG tablet Take 1 tablet (81 mg total) by mouth at bedtime.   atorvastatin 40 MG tablet Commonly known as:  LIPITOR Take 1 tablet (40 mg total) by mouth daily.   carvedilol 12.5 MG tablet Commonly known as:  COREG Take 1 tablet (12.5 mg total) by mouth 2 (two) times daily with a meal.   feeding supplement (ENSURE ENLIVE) Liqd Take 237 mLs by mouth 3 (three) times daily between meals.   ferrous sulfate 325 (65 FE) MG tablet Take 1 tablet (325 mg total) by mouth 2 (two) times daily with a meal.   finasteride 5 MG tablet Commonly known as:  PROSCAR Take 1 tablet (5 mg total) by mouth daily.   latanoprost 0.005 % ophthalmic solution Commonly known as:  XALATAN Place 1 drop into both eyes at bedtime.   losartan 100 MG tablet Commonly known as:  COZAAR Take 1 tablet (100 mg total) by mouth 2 (two) times daily.   omega-3 acid ethyl esters 1 g capsule Commonly known as:  LOVAZA Take 2 capsules (2 g total) by mouth 2 (two) times daily.     pantoprazole 40 MG tablet Commonly known as:  PROTONIX Take 1 tablet (40 mg total) by mouth every morning.   polyethylene glycol packet Commonly known as:  MIRALAX / GLYCOLAX Take 17 g by mouth daily.   sodium bicarbonate 650 MG tablet Take 1 tablet (650 mg total) by mouth 3 (three) times daily. 1 tablet by mouth with meals  timolol 0.5 % ophthalmic solution Commonly known as:  TIMOPTIC Place 1 drop into both eyes daily.        Vitals:   09/18/18 1429  BP: 126/60  Pulse: 67  Resp: 18  Temp: (!) 97.2 F (36.2 C)  Weight: 151 lb (68.5 kg)  Height: 5\' 10"  (1.778 m)   Body mass index is 21.67 kg/m.  Physical Exam  GENERAL APPEARANCE: Alert, conversant. No acute distress.  HEENT: Unremarkable. RESPIRATORY: Breathing is even, unlabored. Lung sounds are clear   CARDIOVASCULAR: Heart RRR no murmurs, rubs or gallops. No peripheral edema.  GASTROINTESTINAL: Abdomen is soft, non-tender, not distended w/ normal bowel sounds.  NEUROLOGIC: Cranial nerves 2-12 grossly intact. Moves all extremities   Labs reviewed: Basic Metabolic Panel: Recent Labs    08/17/18 0354  08/21/18 0756  08/22/18 0631 08/23/18 0345 08/28/18 09/02/18  NA 134*   < > 130*  --  129* 130* 126* 136*  K 4.4   < > 5.1   < > 4.7 3.9 4.6 4.5  CL 107   < > 101  --  99 99  --   --   CO2 21*   < > 23  --  23 23  --   --   GLUCOSE 99   < > 114*  --  112* 102*  --   --   BUN 11   < > 18  --  17 19 24* 37*  CREATININE 0.86   < > 0.98  --  0.95 1.02 1.0 1.4*  CALCIUM 8.4*   < > 8.6*  --  8.7* 8.9  --   --   MG 1.9  --   --   --   --   --   --   --    < > = values in this interval not displayed.   Lab Results  Component Value Date   MICROALBUR 0.87 10/06/2013   Liver Function Tests: Recent Labs    08/17/18 0354 08/18/18 0523 08/22/18 0631  AST 26 23 17   ALT 17 15 16   ALKPHOS 50 48 60  BILITOT 0.6 0.7 0.6  PROT 5.5* 5.5* 6.0*  ALBUMIN 2.5* 2.5* 2.7*   No results for input(s): LIPASE, AMYLASE  in the last 8760 hours. Recent Labs    08/17/18 0413  AMMONIA 15   CBC: Recent Labs    08/08/18 0504 08/14/18 1258  08/17/18 0354 08/21/18 0756 08/22/18 0631 08/28/18 09/02/18  WBC 14.7* 13.2*   < > 13.2* 12.8* 11.3* 13.1 13.4  NEUTROABS 10.1* 10.1*  --   --   --  7.4  --   --   HGB 13.0 10.9*   < > 11.6* 11.0* 11.5* 13.6 11.1*  HCT 40.0 33.0*   < > 35.9* 34.6* 36.6* 40* 33*  MCV 88.3 87.3   < > 89.5 90.1 88.8  --   --   PLT 218 215   < > 246 314 348 357 278   < > = values in this interval not displayed.   Lipid Recent Labs    12/06/17 1510 08/14/18 1008  CHOL 126 115  HDL 40.80 35*  LDLCALC 47 66  TRIG 192.0* 69   Cardiac Enzymes: Recent Labs    08/08/18 0504 08/08/18 0710  TROPONINI 0.03* 0.03*   BNP: No results for input(s): BNP in the last 8760 hours. CBG: Recent Labs    08/13/18 1828 08/22/18 0708  GLUCAP 134* 94    Procedures and  Imaging Studies During Stay: Ct Head Wo Contrast  Result Date: 08/22/2018 CLINICAL DATA:  Altered mental status EXAM: CT HEAD WITHOUT CONTRAST TECHNIQUE: Contiguous axial images were obtained from the base of the skull through the vertex without intravenous contrast. COMPARISON:  Head CT 08/13/2018 FINDINGS: Brain: There is no mass, hemorrhage or extra-axial collection. There is generalized atrophy without lobar predilection. There is no acute or chronic infarction. There is hypoattenuation of the periventricular white matter, most commonly indicating chronic ischemic microangiopathy. Vascular: No abnormal hyperdensity of the major intracranial arteries or dural venous sinuses. No intracranial atherosclerosis. Skull: The visualized skull base, calvarium and extracranial soft tissues are normal. Sinuses/Orbits: No fluid levels or advanced mucosal thickening of the visualized paranasal sinuses. No mastoid or middle ear effusion. The orbits are normal. IMPRESSION: Chronic small vessel ischemia and atrophy without acute abnormality.  Electronically Signed   By: Ulyses Jarred M.D.   On: 08/22/2018 15:47    Assessment/Plan:   Acute metabolic encephalopathy  Community acquired pneumonia of right lower lobe of lung (HCC)  Moderate protein malnutrition ([place])  Hyponatremia  Hyperkalemia  Fracture of vertebra due to osteoporosis with delayed healing, subsequent encounter  Essential hypertension  Hyperlipidemia, mixed  Descending thoracic aortic aneurysm ([place])  Bilateral carotid artery stenosis   Patient is being discharged with the following home health services: OT/PT/aid  Patient is being discharged with the following durable medical equipment: Standard wheelchair, semi-electric hospital bed  Patient has been advised to f/u with their PCP in 1-2 weeks to bring them up to date on their rehab stay.  Social services at facility was responsible for arranging this appointment.  Pt was provided with a 30 day supply of prescriptions for medications and refills must be obtained from their PCP.  For controlled substances, a more limited supply may be provided adequate until PCP appointment  Medications have been reconciled.  Time spent greater than 30 minutes;> 50% of time with patient was spent reviewing records, labs, tests and studies, counseling and developing plan of care  Troy Ray. Sheppard Coil, MD

## 2018-09-22 DIAGNOSIS — I129 Hypertensive chronic kidney disease with stage 1 through stage 4 chronic kidney disease, or unspecified chronic kidney disease: Secondary | ICD-10-CM | POA: Diagnosis not present

## 2018-09-22 DIAGNOSIS — R2681 Unsteadiness on feet: Secondary | ICD-10-CM | POA: Diagnosis not present

## 2018-09-22 DIAGNOSIS — M545 Low back pain: Secondary | ICD-10-CM | POA: Diagnosis not present

## 2018-09-22 DIAGNOSIS — Z7982 Long term (current) use of aspirin: Secondary | ICD-10-CM | POA: Diagnosis not present

## 2018-09-22 DIAGNOSIS — I2510 Atherosclerotic heart disease of native coronary artery without angina pectoris: Secondary | ICD-10-CM | POA: Diagnosis not present

## 2018-09-22 DIAGNOSIS — M6281 Muscle weakness (generalized): Secondary | ICD-10-CM | POA: Diagnosis not present

## 2018-09-22 DIAGNOSIS — N183 Chronic kidney disease, stage 3 (moderate): Secondary | ICD-10-CM | POA: Diagnosis not present

## 2018-09-22 DIAGNOSIS — G8929 Other chronic pain: Secondary | ICD-10-CM | POA: Diagnosis not present

## 2018-09-23 ENCOUNTER — Telehealth: Payer: Self-pay | Admitting: Family Medicine

## 2018-09-23 NOTE — Telephone Encounter (Signed)
Copied from [place] [id number]. Topic: Quick Communication - See Telephone Encounter >> Sep 23, 2018  4:34 PM Antonieta Iba C wrote: CRM for notification. See Telephone encounter for: 09/23/18.  [facility] w/ [facility] - [phone]  Orders for PT   Frequency: 3 week 4;  2 week 4; 1 week 1

## 2018-09-24 ENCOUNTER — Other Ambulatory Visit: Payer: Self-pay

## 2018-09-24 NOTE — Telephone Encounter (Signed)
Verbal orders given  

## 2018-09-24 NOTE — Patient Outreach (Addendum)
[facility]) Care Management  101-Mar-202019  Troy Ray 28-Nov-1927 [id number]   Referral received. Per referral report patient discharged on 09-20-18 from [facility].  No outreach warranted at this time. Transition of Care  will be completed by primary care provider office who will refer to [facility] care management if needed.  Plan: RN CM will close case.  Jone Baseman, RN, MSN [place] Management Care Management Coordinator Direct Line [phone] Cell [phone] Toll Free: [phone]  Fax: [phone]

## 2018-09-25 ENCOUNTER — Telehealth: Payer: Self-pay | Admitting: Family Medicine

## 2018-09-25 DIAGNOSIS — M6281 Muscle weakness (generalized): Secondary | ICD-10-CM | POA: Diagnosis not present

## 2018-09-25 DIAGNOSIS — N183 Chronic kidney disease, stage 3 (moderate): Secondary | ICD-10-CM | POA: Diagnosis not present

## 2018-09-25 DIAGNOSIS — R2681 Unsteadiness on feet: Secondary | ICD-10-CM | POA: Diagnosis not present

## 2018-09-25 DIAGNOSIS — Z7982 Long term (current) use of aspirin: Secondary | ICD-10-CM | POA: Diagnosis not present

## 2018-09-25 DIAGNOSIS — M545 Low back pain: Secondary | ICD-10-CM | POA: Diagnosis not present

## 2018-09-25 DIAGNOSIS — G8929 Other chronic pain: Secondary | ICD-10-CM | POA: Diagnosis not present

## 2018-09-25 DIAGNOSIS — I2510 Atherosclerotic heart disease of native coronary artery without angina pectoris: Secondary | ICD-10-CM | POA: Diagnosis not present

## 2018-09-25 DIAGNOSIS — I129 Hypertensive chronic kidney disease with stage 1 through stage 4 chronic kidney disease, or unspecified chronic kidney disease: Secondary | ICD-10-CM | POA: Diagnosis not present

## 2018-09-25 NOTE — Telephone Encounter (Signed)
Copied from [place] [id number]. Topic: Quick Communication - Home Health Verbal Orders >> Sep 25, 2018 12:12 PM Jodie Echevaria wrote: Caller/Agency: [facility] [place] Number: [phone] Requesting OT/PT/Skilled Nursing/Social Work: OT orders Frequency: 1 week 2, 2 week 2  Also report low BP 96/50 seated 30 mins later 100/60

## 2018-09-26 ENCOUNTER — Ambulatory Visit: Payer: Medicare HMO | Admitting: Family Medicine

## 2018-09-27 DIAGNOSIS — G8929 Other chronic pain: Secondary | ICD-10-CM | POA: Diagnosis not present

## 2018-09-27 DIAGNOSIS — M545 Low back pain: Secondary | ICD-10-CM | POA: Diagnosis not present

## 2018-09-27 DIAGNOSIS — R2681 Unsteadiness on feet: Secondary | ICD-10-CM | POA: Diagnosis not present

## 2018-09-27 DIAGNOSIS — Z7982 Long term (current) use of aspirin: Secondary | ICD-10-CM | POA: Diagnosis not present

## 2018-09-27 DIAGNOSIS — I2510 Atherosclerotic heart disease of native coronary artery without angina pectoris: Secondary | ICD-10-CM | POA: Diagnosis not present

## 2018-09-27 DIAGNOSIS — M6281 Muscle weakness (generalized): Secondary | ICD-10-CM | POA: Diagnosis not present

## 2018-09-27 DIAGNOSIS — N183 Chronic kidney disease, stage 3 (moderate): Secondary | ICD-10-CM | POA: Diagnosis not present

## 2018-09-27 DIAGNOSIS — I129 Hypertensive chronic kidney disease with stage 1 through stage 4 chronic kidney disease, or unspecified chronic kidney disease: Secondary | ICD-10-CM | POA: Diagnosis not present

## 2018-09-27 NOTE — Telephone Encounter (Signed)
Verbal orders given  

## 2018-09-27 NOTE — Telephone Encounter (Signed)
[facility] OT w/ [facility] is calling back in to follow up on vo requested previously.    Please advise  [phone]

## 2018-09-29 DIAGNOSIS — R2681 Unsteadiness on feet: Secondary | ICD-10-CM | POA: Diagnosis not present

## 2018-09-29 DIAGNOSIS — I129 Hypertensive chronic kidney disease with stage 1 through stage 4 chronic kidney disease, or unspecified chronic kidney disease: Secondary | ICD-10-CM | POA: Diagnosis not present

## 2018-09-29 DIAGNOSIS — M545 Low back pain: Secondary | ICD-10-CM | POA: Diagnosis not present

## 2018-09-29 DIAGNOSIS — N183 Chronic kidney disease, stage 3 (moderate): Secondary | ICD-10-CM | POA: Diagnosis not present

## 2018-09-29 DIAGNOSIS — G8929 Other chronic pain: Secondary | ICD-10-CM | POA: Diagnosis not present

## 2018-09-29 DIAGNOSIS — M6281 Muscle weakness (generalized): Secondary | ICD-10-CM | POA: Diagnosis not present

## 2018-09-29 DIAGNOSIS — Z7982 Long term (current) use of aspirin: Secondary | ICD-10-CM | POA: Diagnosis not present

## 2018-09-29 DIAGNOSIS — I2510 Atherosclerotic heart disease of native coronary artery without angina pectoris: Secondary | ICD-10-CM | POA: Diagnosis not present

## 2018-10-01 DIAGNOSIS — G8929 Other chronic pain: Secondary | ICD-10-CM | POA: Diagnosis not present

## 2018-10-01 DIAGNOSIS — I129 Hypertensive chronic kidney disease with stage 1 through stage 4 chronic kidney disease, or unspecified chronic kidney disease: Secondary | ICD-10-CM | POA: Diagnosis not present

## 2018-10-01 DIAGNOSIS — M545 Low back pain: Secondary | ICD-10-CM | POA: Diagnosis not present

## 2018-10-01 DIAGNOSIS — N183 Chronic kidney disease, stage 3 (moderate): Secondary | ICD-10-CM | POA: Diagnosis not present

## 2018-10-01 DIAGNOSIS — R2681 Unsteadiness on feet: Secondary | ICD-10-CM | POA: Diagnosis not present

## 2018-10-01 DIAGNOSIS — I2510 Atherosclerotic heart disease of native coronary artery without angina pectoris: Secondary | ICD-10-CM | POA: Diagnosis not present

## 2018-10-01 DIAGNOSIS — M6281 Muscle weakness (generalized): Secondary | ICD-10-CM | POA: Diagnosis not present

## 2018-10-01 DIAGNOSIS — Z7982 Long term (current) use of aspirin: Secondary | ICD-10-CM | POA: Diagnosis not present

## 2018-10-02 ENCOUNTER — Telehealth: Payer: Self-pay

## 2018-10-02 DIAGNOSIS — M6281 Muscle weakness (generalized): Secondary | ICD-10-CM | POA: Diagnosis not present

## 2018-10-02 DIAGNOSIS — Z7982 Long term (current) use of aspirin: Secondary | ICD-10-CM | POA: Diagnosis not present

## 2018-10-02 DIAGNOSIS — I2510 Atherosclerotic heart disease of native coronary artery without angina pectoris: Secondary | ICD-10-CM | POA: Diagnosis not present

## 2018-10-02 DIAGNOSIS — L602 Onychogryphosis: Secondary | ICD-10-CM

## 2018-10-02 DIAGNOSIS — N183 Chronic kidney disease, stage 3 (moderate): Secondary | ICD-10-CM | POA: Diagnosis not present

## 2018-10-02 DIAGNOSIS — G8929 Other chronic pain: Secondary | ICD-10-CM | POA: Diagnosis not present

## 2018-10-02 DIAGNOSIS — R2681 Unsteadiness on feet: Secondary | ICD-10-CM | POA: Diagnosis not present

## 2018-10-02 DIAGNOSIS — M545 Low back pain: Secondary | ICD-10-CM | POA: Diagnosis not present

## 2018-10-02 DIAGNOSIS — I129 Hypertensive chronic kidney disease with stage 1 through stage 4 chronic kidney disease, or unspecified chronic kidney disease: Secondary | ICD-10-CM | POA: Diagnosis not present

## 2018-10-02 NOTE — Telephone Encounter (Signed)
Copied from [place] [id number]. Topic: Referral - Request for Referral >> Sep 30, 2018  9:07 AM Reyne Dumas L wrote: Has patient seen PCP for this complaint? No - pt is currently in [place], is insurance requiring patient see PCP for this issue before PCP can refer them? Referral for which specialty: podiatrist Preferred provider/office: unknown, one as close to that area as possible would be helpful Reason for referral: Pt's wife, LouElla, calling - states that the physical therapist at [facility] recommended that pt see a podiatrist because pt's toenails are so tough and can't be cut - pt is having trouble walking because of this.  Pt is currently in respite care and they are seeking referral to podiatrist only.  LouElla can be reached at [phone] or [phone]

## 2018-10-02 NOTE — Telephone Encounter (Signed)
Referral placed.

## 2018-10-07 DIAGNOSIS — M6281 Muscle weakness (generalized): Secondary | ICD-10-CM | POA: Diagnosis not present

## 2018-10-07 DIAGNOSIS — M545 Low back pain: Secondary | ICD-10-CM | POA: Diagnosis not present

## 2018-10-07 DIAGNOSIS — I129 Hypertensive chronic kidney disease with stage 1 through stage 4 chronic kidney disease, or unspecified chronic kidney disease: Secondary | ICD-10-CM | POA: Diagnosis not present

## 2018-10-07 DIAGNOSIS — Z7982 Long term (current) use of aspirin: Secondary | ICD-10-CM | POA: Diagnosis not present

## 2018-10-07 DIAGNOSIS — I2510 Atherosclerotic heart disease of native coronary artery without angina pectoris: Secondary | ICD-10-CM | POA: Diagnosis not present

## 2018-10-07 DIAGNOSIS — N183 Chronic kidney disease, stage 3 (moderate): Secondary | ICD-10-CM | POA: Diagnosis not present

## 2018-10-07 DIAGNOSIS — R2681 Unsteadiness on feet: Secondary | ICD-10-CM | POA: Diagnosis not present

## 2018-10-07 DIAGNOSIS — G8929 Other chronic pain: Secondary | ICD-10-CM | POA: Diagnosis not present

## 2018-10-08 DIAGNOSIS — I129 Hypertensive chronic kidney disease with stage 1 through stage 4 chronic kidney disease, or unspecified chronic kidney disease: Secondary | ICD-10-CM | POA: Diagnosis not present

## 2018-10-08 DIAGNOSIS — M6281 Muscle weakness (generalized): Secondary | ICD-10-CM | POA: Diagnosis not present

## 2018-10-08 DIAGNOSIS — M545 Low back pain: Secondary | ICD-10-CM | POA: Diagnosis not present

## 2018-10-08 DIAGNOSIS — I2510 Atherosclerotic heart disease of native coronary artery without angina pectoris: Secondary | ICD-10-CM | POA: Diagnosis not present

## 2018-10-08 DIAGNOSIS — N183 Chronic kidney disease, stage 3 (moderate): Secondary | ICD-10-CM | POA: Diagnosis not present

## 2018-10-08 DIAGNOSIS — R2681 Unsteadiness on feet: Secondary | ICD-10-CM | POA: Diagnosis not present

## 2018-10-08 DIAGNOSIS — Z7982 Long term (current) use of aspirin: Secondary | ICD-10-CM | POA: Diagnosis not present

## 2018-10-08 DIAGNOSIS — G8929 Other chronic pain: Secondary | ICD-10-CM | POA: Diagnosis not present

## 2018-10-09 DIAGNOSIS — I2510 Atherosclerotic heart disease of native coronary artery without angina pectoris: Secondary | ICD-10-CM | POA: Diagnosis not present

## 2018-10-09 DIAGNOSIS — G8929 Other chronic pain: Secondary | ICD-10-CM | POA: Diagnosis not present

## 2018-10-09 DIAGNOSIS — Z7982 Long term (current) use of aspirin: Secondary | ICD-10-CM | POA: Diagnosis not present

## 2018-10-09 DIAGNOSIS — M6281 Muscle weakness (generalized): Secondary | ICD-10-CM | POA: Diagnosis not present

## 2018-10-09 DIAGNOSIS — R2681 Unsteadiness on feet: Secondary | ICD-10-CM | POA: Diagnosis not present

## 2018-10-09 DIAGNOSIS — N183 Chronic kidney disease, stage 3 (moderate): Secondary | ICD-10-CM | POA: Diagnosis not present

## 2018-10-09 DIAGNOSIS — I129 Hypertensive chronic kidney disease with stage 1 through stage 4 chronic kidney disease, or unspecified chronic kidney disease: Secondary | ICD-10-CM | POA: Diagnosis not present

## 2018-10-09 DIAGNOSIS — M545 Low back pain: Secondary | ICD-10-CM | POA: Diagnosis not present

## 2018-10-10 DIAGNOSIS — Z7982 Long term (current) use of aspirin: Secondary | ICD-10-CM | POA: Diagnosis not present

## 2018-10-10 DIAGNOSIS — M6281 Muscle weakness (generalized): Secondary | ICD-10-CM | POA: Diagnosis not present

## 2018-10-10 DIAGNOSIS — G8929 Other chronic pain: Secondary | ICD-10-CM | POA: Diagnosis not present

## 2018-10-10 DIAGNOSIS — N183 Chronic kidney disease, stage 3 (moderate): Secondary | ICD-10-CM | POA: Diagnosis not present

## 2018-10-10 DIAGNOSIS — M545 Low back pain: Secondary | ICD-10-CM | POA: Diagnosis not present

## 2018-10-10 DIAGNOSIS — R2681 Unsteadiness on feet: Secondary | ICD-10-CM | POA: Diagnosis not present

## 2018-10-10 DIAGNOSIS — I129 Hypertensive chronic kidney disease with stage 1 through stage 4 chronic kidney disease, or unspecified chronic kidney disease: Secondary | ICD-10-CM | POA: Diagnosis not present

## 2018-10-10 DIAGNOSIS — I2510 Atherosclerotic heart disease of native coronary artery without angina pectoris: Secondary | ICD-10-CM | POA: Diagnosis not present

## 2018-10-11 ENCOUNTER — Telehealth: Payer: Self-pay | Admitting: Family Medicine

## 2018-10-11 DIAGNOSIS — Z7982 Long term (current) use of aspirin: Secondary | ICD-10-CM | POA: Diagnosis not present

## 2018-10-11 DIAGNOSIS — I2510 Atherosclerotic heart disease of native coronary artery without angina pectoris: Secondary | ICD-10-CM | POA: Diagnosis not present

## 2018-10-11 DIAGNOSIS — I129 Hypertensive chronic kidney disease with stage 1 through stage 4 chronic kidney disease, or unspecified chronic kidney disease: Secondary | ICD-10-CM | POA: Diagnosis not present

## 2018-10-11 DIAGNOSIS — N183 Chronic kidney disease, stage 3 (moderate): Secondary | ICD-10-CM | POA: Diagnosis not present

## 2018-10-11 DIAGNOSIS — G8929 Other chronic pain: Secondary | ICD-10-CM | POA: Diagnosis not present

## 2018-10-11 DIAGNOSIS — M545 Low back pain: Secondary | ICD-10-CM | POA: Diagnosis not present

## 2018-10-11 DIAGNOSIS — R2681 Unsteadiness on feet: Secondary | ICD-10-CM | POA: Diagnosis not present

## 2018-10-11 DIAGNOSIS — M6281 Muscle weakness (generalized): Secondary | ICD-10-CM | POA: Diagnosis not present

## 2018-10-11 NOTE — Telephone Encounter (Signed)
Copied from [place] [id number]. Topic: Quick Communication - Home Health Verbal Orders >> Oct 11, 2018 12:51 PM Conception Chancy, NT wrote: Caller/Agency: [facility] [place] Number: [phone] Requesting OT/PT/Skilled Nursing/Social Work: OT Frequency: wants verbals to put Home Health occupational therapy on hold for 3 weeks. Until 11/04/18.

## 2018-10-11 NOTE — Telephone Encounter (Signed)
Verbal orders given  

## 2018-10-14 DIAGNOSIS — I129 Hypertensive chronic kidney disease with stage 1 through stage 4 chronic kidney disease, or unspecified chronic kidney disease: Secondary | ICD-10-CM | POA: Diagnosis not present

## 2018-10-14 DIAGNOSIS — Z7982 Long term (current) use of aspirin: Secondary | ICD-10-CM | POA: Diagnosis not present

## 2018-10-14 DIAGNOSIS — G8929 Other chronic pain: Secondary | ICD-10-CM | POA: Diagnosis not present

## 2018-10-14 DIAGNOSIS — M545 Low back pain: Secondary | ICD-10-CM | POA: Diagnosis not present

## 2018-10-14 DIAGNOSIS — N183 Chronic kidney disease, stage 3 (moderate): Secondary | ICD-10-CM | POA: Diagnosis not present

## 2018-10-14 DIAGNOSIS — R2681 Unsteadiness on feet: Secondary | ICD-10-CM | POA: Diagnosis not present

## 2018-10-14 DIAGNOSIS — I2510 Atherosclerotic heart disease of native coronary artery without angina pectoris: Secondary | ICD-10-CM | POA: Diagnosis not present

## 2018-10-14 DIAGNOSIS — M6281 Muscle weakness (generalized): Secondary | ICD-10-CM | POA: Diagnosis not present

## 2018-10-15 ENCOUNTER — Ambulatory Visit (INDEPENDENT_AMBULATORY_CARE_PROVIDER_SITE_OTHER): Payer: Medicare HMO | Admitting: Family Medicine

## 2018-10-15 ENCOUNTER — Encounter: Payer: Self-pay | Admitting: Family Medicine

## 2018-10-15 DIAGNOSIS — N182 Chronic kidney disease, stage 2 (mild): Secondary | ICD-10-CM | POA: Diagnosis not present

## 2018-10-15 DIAGNOSIS — I10 Essential (primary) hypertension: Secondary | ICD-10-CM

## 2018-10-15 DIAGNOSIS — M79672 Pain in left foot: Secondary | ICD-10-CM

## 2018-10-15 DIAGNOSIS — M8008XG Age-related osteoporosis with current pathological fracture, vertebra(e), subsequent encounter for fracture with delayed healing: Secondary | ICD-10-CM

## 2018-10-15 DIAGNOSIS — M79671 Pain in right foot: Secondary | ICD-10-CM

## 2018-10-15 DIAGNOSIS — R269 Unspecified abnormalities of gait and mobility: Secondary | ICD-10-CM

## 2018-10-15 NOTE — Patient Instructions (Signed)

## 2018-10-15 NOTE — Progress Notes (Signed)
144 

## 2018-10-16 DIAGNOSIS — M545 Low back pain: Secondary | ICD-10-CM | POA: Diagnosis not present

## 2018-10-16 DIAGNOSIS — M6281 Muscle weakness (generalized): Secondary | ICD-10-CM | POA: Diagnosis not present

## 2018-10-16 DIAGNOSIS — G8929 Other chronic pain: Secondary | ICD-10-CM | POA: Diagnosis not present

## 2018-10-16 DIAGNOSIS — I129 Hypertensive chronic kidney disease with stage 1 through stage 4 chronic kidney disease, or unspecified chronic kidney disease: Secondary | ICD-10-CM | POA: Diagnosis not present

## 2018-10-16 DIAGNOSIS — R2681 Unsteadiness on feet: Secondary | ICD-10-CM | POA: Diagnosis not present

## 2018-10-16 DIAGNOSIS — Z7982 Long term (current) use of aspirin: Secondary | ICD-10-CM | POA: Diagnosis not present

## 2018-10-16 DIAGNOSIS — N183 Chronic kidney disease, stage 3 (moderate): Secondary | ICD-10-CM | POA: Diagnosis not present

## 2018-10-16 DIAGNOSIS — I2510 Atherosclerotic heart disease of native coronary artery without angina pectoris: Secondary | ICD-10-CM | POA: Diagnosis not present

## 2018-10-18 DIAGNOSIS — M6281 Muscle weakness (generalized): Secondary | ICD-10-CM | POA: Diagnosis not present

## 2018-10-18 DIAGNOSIS — Z7982 Long term (current) use of aspirin: Secondary | ICD-10-CM | POA: Diagnosis not present

## 2018-10-18 DIAGNOSIS — R2681 Unsteadiness on feet: Secondary | ICD-10-CM | POA: Diagnosis not present

## 2018-10-18 DIAGNOSIS — N183 Chronic kidney disease, stage 3 (moderate): Secondary | ICD-10-CM | POA: Diagnosis not present

## 2018-10-18 DIAGNOSIS — G8929 Other chronic pain: Secondary | ICD-10-CM | POA: Diagnosis not present

## 2018-10-18 DIAGNOSIS — M545 Low back pain: Secondary | ICD-10-CM | POA: Diagnosis not present

## 2018-10-18 DIAGNOSIS — I2510 Atherosclerotic heart disease of native coronary artery without angina pectoris: Secondary | ICD-10-CM | POA: Diagnosis not present

## 2018-10-18 DIAGNOSIS — I129 Hypertensive chronic kidney disease with stage 1 through stage 4 chronic kidney disease, or unspecified chronic kidney disease: Secondary | ICD-10-CM | POA: Diagnosis not present

## 2018-10-19 DIAGNOSIS — G9341 Metabolic encephalopathy: Secondary | ICD-10-CM | POA: Diagnosis not present

## 2018-10-19 DIAGNOSIS — M79672 Pain in left foot: Secondary | ICD-10-CM

## 2018-10-19 DIAGNOSIS — S32009A Unspecified fracture of unspecified lumbar vertebra, initial encounter for closed fracture: Secondary | ICD-10-CM | POA: Diagnosis not present

## 2018-10-19 DIAGNOSIS — M79671 Pain in right foot: Secondary | ICD-10-CM | POA: Insufficient documentation

## 2018-10-19 NOTE — Assessment & Plan Note (Signed)
Is currently in a nursing home and while he says he wants to return home he and his family acknowledge that he does not seem motivated to move and get stronger. He is referred to physical therapy and spent 60 minutes in the visit a good deal of time dedicated to trying to motivate patient to adopt small goals each day and keep moving. Encouraging him not to get frustrated and give up when things move slowly. His wife will be unable to physically handle him if he cannot get stronger.

## 2018-10-19 NOTE — Assessment & Plan Note (Signed)
Adequately controlled, no changes to meds. Encouraged heart healthy diet such as the DASH diet and exercise as tolerated.

## 2018-10-19 NOTE — Progress Notes (Signed)
Subjective:    Patient ID: Troy Ray, male    DOB: [birth year], 84 y.o.   MRN: [id number]  No chief complaint on file.   HPI Patient is in today for follow-up accompanied by his son and wife.  He is currently in a nursing home due to his inability to help with his activities of daily living to any great degree.  His wife is not physically able to care for him at home in his current state.  He has been involved minimally in some physical therapy activities at his facility but is not motivated.  Acknowledges he is frustrated with his current state but is not getting up and working on getting stronger.  At this point his family acknowledges he mostly sits and lies around.  He does have daily pain but his back is more tolerable than it previously was.  He does have foot pain as well and they are interested in a podiatry referral.  He continues to eat but struggles with dysphagia at times. Endorses anxiety and anhedonia but not suicidal ideation. Denies CP/palp/SOB/HA/congestion/fevers/GI or GU c/o. Taking meds as prescribed  Past Medical History:  Diagnosis Date  . Adenomatous colon polyp   . BPH (benign prostatic hyperplasia) 07/14/2013  . CAD (coronary artery disease)   . Carotid stenosis, bilateral    right 60-79% stenosed.  Left 40-59%  . Chronic kidney disease    chronic, stage II  . CRI (chronic renal insufficiency)   . Diverticulosis   . Dysphagia   . Esophageal stricture    Dr [name]  . Glaucoma   . Headache(784.0) 12/01/2013  . Hyperlipidemia   . Hypertension   . Insomnia 05/12/2015  . Low back pain syndrome 04/03/2014  . Medicare annual wellness visit, subsequent 02/04/2015  . Memory loss   . Postural lightheadedness   . PVD (peripheral vascular disease) ([place])   . Renal insufficiency   . Sensorineural hearing loss, bilateral   . Unspecified constipation 12/29/2012    Past Surgical History:  Procedure Laterality Date  . ABDOMINAL AORTIC ANEURYSM REPAIR  1996   Dr [name]    . CORONARY ARTERY BYPASS GRAFT    . EYE SURGERY     cataracts b/l and laser  . HERNIA REPAIR  03/23/05   left inguinal Lichtenstein repair, with mesh.  [name]  . IR [facility] THORACIC WITH BONE BIOPSY  11/08/2017  . IR RADIOLOGIST EVAL & MGMT  10/22/2017  . IR VERTEBROPLASTY CERV/THOR BX INC UNI/BIL INC/INJECT/IMAGING  06/25/2018  . RENAL ARTERY STENT  1996   Dr [name].  Bilateral stenting    Family History  Problem Relation Age of Onset  . Heart disease Father        CAD  . Coronary artery disease Father   . Stroke Father   . Hearing loss Father   . Hearing loss Paternal Grandfather   . Heart disease Son   . Heart disease Brother        MI  . Cancer Neg Hx   . Diabetes Neg Hx     Social History   Socioeconomic History  . Marital status: Married    Spouse name: Not on file  . Number of children: 3  . Years of education: Not on file  . Highest education level: Not on file  Occupational History  . Occupation: MEDIA ASSISTANT    Employer: [employer]  Social Needs  . Financial resource strain: Not on file  . Food insecurity:  Worry: Not on file    Inability: Not on file  . Transportation needs:    Medical: Not on file    Non-medical: Not on file  Tobacco Use  . Smoking status: Former Research scientist (life sciences)  . Smokeless tobacco: Never Used  . Tobacco comment: quit 1980  Substance and Sexual Activity  . Alcohol use: Yes    Alcohol/week: 1.0 standard drinks    Types: 1 Glasses of wine per week  . Drug use: No  . Sexual activity: Yes    Comment: lives with wife, retired, no dietary restriction  Lifestyle  . Physical activity:    Days per week: Not on file    Minutes per session: Not on file  . Stress: Not on file  Relationships  . Social connections:    Talks on phone: Not on file    Gets together: Not on file    Attends religious service: Not on file    Active member of club or organization: Not on file    Attends meetings of clubs or organizations: Not on  file    Relationship status: Not on file  . Intimate partner violence:    Fear of current or ex partner: Not on file    Emotionally abused: Not on file    Physically abused: Not on file    Forced sexual activity: Not on file  Other Topics Concern  . Not on file  Social History Narrative  . Not on file    Outpatient Medications Prior to Visit  Medication Sig Dispense Refill  . amLODipine (NORVASC) 10 MG tablet Take 1 tablet (10 mg total) by mouth daily. 30 tablet 0  . aspirin EC 81 MG tablet Take 1 tablet (81 mg total) by mouth at bedtime. 30 tablet 0  . atorvastatin (LIPITOR) 40 MG tablet Take 1 tablet (40 mg total) by mouth daily. 30 tablet 0  . carvedilol (COREG) 12.5 MG tablet Take 1 tablet (12.5 mg total) by mouth 2 (two) times daily with a meal. 60 tablet 0  . feeding supplement, ENSURE ENLIVE, (ENSURE ENLIVE) LIQD Take 237 mLs by mouth 3 (three) times daily between meals. 7 Bottle 0  . ferrous sulfate 325 (65 FE) MG tablet Take 1 tablet (325 mg total) by mouth 2 (two) times daily with a meal. 60 tablet 0  . finasteride (PROSCAR) 5 MG tablet Take 1 tablet (5 mg total) by mouth daily. 30 tablet 0  . latanoprost (XALATAN) 0.005 % ophthalmic solution Place 1 drop into both eyes at bedtime. 2.5 mL 0  . losartan (COZAAR) 100 MG tablet Take 1 tablet (100 mg total) by mouth 2 (two) times daily. 60 tablet 0  . omega-3 acid ethyl esters (LOVAZA) 1 g capsule Take 2 capsules (2 g total) by mouth 2 (two) times daily. 30 capsule 0  . pantoprazole (PROTONIX) 40 MG tablet Take 1 tablet (40 mg total) by mouth every morning. 90 tablet 2  . polyethylene glycol (MIRALAX / GLYCOLAX) packet Take 17 g by mouth daily. 14 each 0  . sodium bicarbonate 650 MG tablet Take 1 tablet (650 mg total) by mouth 3 (three) times daily. 1 tablet by mouth with meals 90 tablet 0  . timolol (TIMOPTIC) 0.5 % ophthalmic solution Place 1 drop into both eyes daily. 10 mL 0   No facility-administered medications prior to  visit.     No Known Allergies  Review of Systems  Constitutional: Positive for malaise/fatigue. Negative for fever.  HENT: Negative for congestion.  Eyes: Negative for blurred vision.  Respiratory: Negative for shortness of breath.   Cardiovascular: Negative for chest pain, palpitations and leg swelling.  Gastrointestinal: Negative for abdominal pain, blood in stool and nausea.  Genitourinary: Negative for dysuria and frequency.  Musculoskeletal: Positive for back pain, falls, joint pain and myalgias.  Skin: Negative for rash.  Neurological: Negative for dizziness, loss of consciousness and headaches.  Endo/Heme/Allergies: Negative for environmental allergies.  Psychiatric/Behavioral: Positive for depression. The patient is not nervous/anxious.        Objective:    Physical Exam Vitals signs and nursing note reviewed.  Constitutional:      General: He is not in acute distress.    Appearance: He is well-developed.  HENT:     Head: Normocephalic and atraumatic.     Nose: Nose normal.  Eyes:     General:        Right eye: No discharge.        Left eye: No discharge.  Neck:     Musculoskeletal: Normal range of motion and neck supple.  Cardiovascular:     Rate and Rhythm: Normal rate and regular rhythm.     Heart sounds: No murmur.  Pulmonary:     Effort: Pulmonary effort is normal.     Breath sounds: Normal breath sounds.  Abdominal:     General: Bowel sounds are normal.     Palpations: Abdomen is soft.     Tenderness: There is no abdominal tenderness.  Skin:    General: Skin is warm and dry.  Neurological:     Mental Status: He is alert and oriented to person, place, and time.     BP (!) 144/40 (BP Location: Left Arm, Patient Position: Sitting, Cuff Size: Normal)   Pulse 62   Temp 97.9 F (36.6 C) (Oral)   Resp 18   Wt 120 lb (54.4 kg)   SpO2 100%   BMI 17.22 kg/m  Wt Readings from Last 3 Encounters:  10/15/18 120 lb (54.4 kg)  09/18/18 151 lb (68.5 kg)    08/26/18 151 lb (68.5 kg)     Lab Results  Component Value Date   WBC 13.4 09/02/2018   HGB 11.1 (A) 09/02/2018   HCT 33 (A) 09/02/2018   PLT 278 09/02/2018   GLUCOSE 102 (H) 08/23/2018   CHOL 115 08/14/2018   TRIG 69 08/14/2018   HDL 35 (L) 08/14/2018   LDLCALC 66 08/14/2018   ALT 16 08/22/2018   AST 17 08/22/2018   NA 136 (A) 09/02/2018   K 4.5 09/02/2018   CL 99 08/23/2018   CREATININE 1.4 (A) 09/02/2018   BUN 37 (A) 09/02/2018   CO2 23 08/23/2018   TSH 3.513 08/14/2018   PSA 0.75 10/06/2013   INR 1.06 06/25/2018   HGBA1C 5.7 (H) 08/14/2018   MICROALBUR 0.87 10/06/2013    Lab Results  Component Value Date   TSH 3.513 08/14/2018   Lab Results  Component Value Date   WBC 13.4 09/02/2018   HGB 11.1 (A) 09/02/2018   HCT 33 (A) 09/02/2018   MCV 88.8 08/22/2018   PLT 278 09/02/2018   Lab Results  Component Value Date   NA 136 (A) 09/02/2018   K 4.5 09/02/2018   CO2 23 08/23/2018   GLUCOSE 102 (H) 08/23/2018   BUN 37 (A) 09/02/2018   CREATININE 1.4 (A) 09/02/2018   BILITOT 0.6 08/22/2018   ALKPHOS 60 08/22/2018   AST 17 08/22/2018   ALT 16 08/22/2018  PROT 6.0 (L) 08/22/2018   ALBUMIN 2.7 (L) 08/22/2018   CALCIUM 8.9 08/23/2018   ANIONGAP 8 08/23/2018   GFR 53.97 (L) 12/06/2017   Lab Results  Component Value Date   CHOL 115 08/14/2018   Lab Results  Component Value Date   HDL 35 (L) 08/14/2018   Lab Results  Component Value Date   LDLCALC 66 08/14/2018   Lab Results  Component Value Date   TRIG 69 08/14/2018   Lab Results  Component Value Date   CHOLHDL 3.3 08/14/2018   Lab Results  Component Value Date   HGBA1C 5.7 (H) 08/14/2018       Assessment & Plan:   Problem List Items Addressed This Visit    Essential hypertension    Adequately controlled, no changes to meds. Encouraged heart healthy diet such as the DASH diet and exercise as tolerated.       CHRONIC KIDNEY DISEASE STAGE II (MILD)    Hydrate well and monitor       Gait disturbance    Is currently in a nursing home and while he says he wants to return home he and his family acknowledge that he does not seem motivated to move and get stronger. He is referred to physical therapy and spent 60 minutes in the visit a good deal of time dedicated to trying to motivate patient to adopt small goals each day and keep moving. Encouraging him not to get frustrated and give up when things move slowly. His wife will be unable to physically handle him if he cannot get stronger.       Vertebral fracture, osteoporotic ([place])    Still has some back pain but is improved to the point where he is able to move some      Foot pain, bilateral    They are interested in a podiatry referral but are unclear where they want to go so they are given a hand written script they can use at his nursing home or can take out to a community practitioner.          I am having Troy Ray maintain his timolol, sodium bicarbonate, polyethylene glycol, pantoprazole, omega-3 acid ethyl esters, losartan, latanoprost, finasteride, ferrous sulfate, feeding supplement (ENSURE ENLIVE), carvedilol, atorvastatin, aspirin EC, and amLODipine.  No orders of the defined types were placed in this encounter.    Penni Homans, MD

## 2018-10-19 NOTE — Assessment & Plan Note (Signed)
Still has some back pain but is improved to the point where he is able to move some

## 2018-10-19 NOTE — Assessment & Plan Note (Signed)
Hydrate well and monitor 

## 2018-10-19 NOTE — Assessment & Plan Note (Signed)
They are interested in a podiatry referral but are unclear where they want to go so they are given a hand written script they can use at his nursing home or can take out to a community practitioner.

## 2018-10-21 DIAGNOSIS — M545 Low back pain: Secondary | ICD-10-CM | POA: Diagnosis not present

## 2018-10-21 DIAGNOSIS — G8929 Other chronic pain: Secondary | ICD-10-CM | POA: Diagnosis not present

## 2018-10-21 DIAGNOSIS — N183 Chronic kidney disease, stage 3 (moderate): Secondary | ICD-10-CM | POA: Diagnosis not present

## 2018-10-21 DIAGNOSIS — I2510 Atherosclerotic heart disease of native coronary artery without angina pectoris: Secondary | ICD-10-CM | POA: Diagnosis not present

## 2018-10-21 DIAGNOSIS — Z7982 Long term (current) use of aspirin: Secondary | ICD-10-CM | POA: Diagnosis not present

## 2018-10-21 DIAGNOSIS — M6281 Muscle weakness (generalized): Secondary | ICD-10-CM | POA: Diagnosis not present

## 2018-10-21 DIAGNOSIS — R2681 Unsteadiness on feet: Secondary | ICD-10-CM | POA: Diagnosis not present

## 2018-10-21 DIAGNOSIS — I129 Hypertensive chronic kidney disease with stage 1 through stage 4 chronic kidney disease, or unspecified chronic kidney disease: Secondary | ICD-10-CM | POA: Diagnosis not present

## 2018-10-23 ENCOUNTER — Institutional Professional Consult (permissible substitution): Payer: Medicare HMO | Admitting: Neurology

## 2018-10-23 DIAGNOSIS — L03032 Cellulitis of left toe: Secondary | ICD-10-CM | POA: Diagnosis not present

## 2018-10-23 DIAGNOSIS — M79672 Pain in left foot: Secondary | ICD-10-CM | POA: Diagnosis not present

## 2018-10-23 DIAGNOSIS — B351 Tinea unguium: Secondary | ICD-10-CM | POA: Diagnosis not present

## 2018-10-23 DIAGNOSIS — M79671 Pain in right foot: Secondary | ICD-10-CM | POA: Diagnosis not present

## 2018-10-24 DIAGNOSIS — N183 Chronic kidney disease, stage 3 (moderate): Secondary | ICD-10-CM | POA: Diagnosis not present

## 2018-10-24 DIAGNOSIS — I129 Hypertensive chronic kidney disease with stage 1 through stage 4 chronic kidney disease, or unspecified chronic kidney disease: Secondary | ICD-10-CM | POA: Diagnosis not present

## 2018-10-24 DIAGNOSIS — M545 Low back pain: Secondary | ICD-10-CM | POA: Diagnosis not present

## 2018-10-24 DIAGNOSIS — I2510 Atherosclerotic heart disease of native coronary artery without angina pectoris: Secondary | ICD-10-CM | POA: Diagnosis not present

## 2018-10-24 DIAGNOSIS — R2681 Unsteadiness on feet: Secondary | ICD-10-CM | POA: Diagnosis not present

## 2018-10-24 DIAGNOSIS — Z7982 Long term (current) use of aspirin: Secondary | ICD-10-CM | POA: Diagnosis not present

## 2018-10-24 DIAGNOSIS — M6281 Muscle weakness (generalized): Secondary | ICD-10-CM | POA: Diagnosis not present

## 2018-10-24 DIAGNOSIS — G8929 Other chronic pain: Secondary | ICD-10-CM | POA: Diagnosis not present

## 2018-10-28 DIAGNOSIS — M6281 Muscle weakness (generalized): Secondary | ICD-10-CM | POA: Diagnosis not present

## 2018-10-28 DIAGNOSIS — I2510 Atherosclerotic heart disease of native coronary artery without angina pectoris: Secondary | ICD-10-CM | POA: Diagnosis not present

## 2018-10-28 DIAGNOSIS — M545 Low back pain: Secondary | ICD-10-CM | POA: Diagnosis not present

## 2018-10-28 DIAGNOSIS — N183 Chronic kidney disease, stage 3 (moderate): Secondary | ICD-10-CM | POA: Diagnosis not present

## 2018-10-28 DIAGNOSIS — Z7982 Long term (current) use of aspirin: Secondary | ICD-10-CM | POA: Diagnosis not present

## 2018-10-28 DIAGNOSIS — I129 Hypertensive chronic kidney disease with stage 1 through stage 4 chronic kidney disease, or unspecified chronic kidney disease: Secondary | ICD-10-CM | POA: Diagnosis not present

## 2018-10-28 DIAGNOSIS — R2681 Unsteadiness on feet: Secondary | ICD-10-CM | POA: Diagnosis not present

## 2018-10-28 DIAGNOSIS — G8929 Other chronic pain: Secondary | ICD-10-CM | POA: Diagnosis not present

## 2018-10-31 DIAGNOSIS — I129 Hypertensive chronic kidney disease with stage 1 through stage 4 chronic kidney disease, or unspecified chronic kidney disease: Secondary | ICD-10-CM | POA: Diagnosis not present

## 2018-10-31 DIAGNOSIS — I2510 Atherosclerotic heart disease of native coronary artery without angina pectoris: Secondary | ICD-10-CM | POA: Diagnosis not present

## 2018-10-31 DIAGNOSIS — G8929 Other chronic pain: Secondary | ICD-10-CM | POA: Diagnosis not present

## 2018-10-31 DIAGNOSIS — M545 Low back pain: Secondary | ICD-10-CM | POA: Diagnosis not present

## 2018-10-31 DIAGNOSIS — M6281 Muscle weakness (generalized): Secondary | ICD-10-CM | POA: Diagnosis not present

## 2018-10-31 DIAGNOSIS — Z7982 Long term (current) use of aspirin: Secondary | ICD-10-CM | POA: Diagnosis not present

## 2018-10-31 DIAGNOSIS — N183 Chronic kidney disease, stage 3 (moderate): Secondary | ICD-10-CM | POA: Diagnosis not present

## 2018-10-31 DIAGNOSIS — R2681 Unsteadiness on feet: Secondary | ICD-10-CM | POA: Diagnosis not present

## 2018-11-04 DIAGNOSIS — I129 Hypertensive chronic kidney disease with stage 1 through stage 4 chronic kidney disease, or unspecified chronic kidney disease: Secondary | ICD-10-CM | POA: Diagnosis not present

## 2018-11-04 DIAGNOSIS — R2681 Unsteadiness on feet: Secondary | ICD-10-CM | POA: Diagnosis not present

## 2018-11-04 DIAGNOSIS — I2510 Atherosclerotic heart disease of native coronary artery without angina pectoris: Secondary | ICD-10-CM | POA: Diagnosis not present

## 2018-11-04 DIAGNOSIS — M545 Low back pain: Secondary | ICD-10-CM | POA: Diagnosis not present

## 2018-11-04 DIAGNOSIS — N183 Chronic kidney disease, stage 3 (moderate): Secondary | ICD-10-CM | POA: Diagnosis not present

## 2018-11-04 DIAGNOSIS — M6281 Muscle weakness (generalized): Secondary | ICD-10-CM | POA: Diagnosis not present

## 2018-11-04 DIAGNOSIS — Z7982 Long term (current) use of aspirin: Secondary | ICD-10-CM | POA: Diagnosis not present

## 2018-11-04 DIAGNOSIS — G8929 Other chronic pain: Secondary | ICD-10-CM | POA: Diagnosis not present

## 2018-11-05 ENCOUNTER — Telehealth: Payer: Self-pay | Admitting: Family Medicine

## 2018-11-05 DIAGNOSIS — N183 Chronic kidney disease, stage 3 (moderate): Secondary | ICD-10-CM | POA: Diagnosis not present

## 2018-11-05 DIAGNOSIS — I129 Hypertensive chronic kidney disease with stage 1 through stage 4 chronic kidney disease, or unspecified chronic kidney disease: Secondary | ICD-10-CM | POA: Diagnosis not present

## 2018-11-05 DIAGNOSIS — M6281 Muscle weakness (generalized): Secondary | ICD-10-CM | POA: Diagnosis not present

## 2018-11-05 DIAGNOSIS — M545 Low back pain: Secondary | ICD-10-CM | POA: Diagnosis not present

## 2018-11-05 DIAGNOSIS — G8929 Other chronic pain: Secondary | ICD-10-CM | POA: Diagnosis not present

## 2018-11-05 DIAGNOSIS — I2510 Atherosclerotic heart disease of native coronary artery without angina pectoris: Secondary | ICD-10-CM | POA: Diagnosis not present

## 2018-11-05 DIAGNOSIS — R2681 Unsteadiness on feet: Secondary | ICD-10-CM | POA: Diagnosis not present

## 2018-11-05 DIAGNOSIS — Z7982 Long term (current) use of aspirin: Secondary | ICD-10-CM | POA: Diagnosis not present

## 2018-11-05 NOTE — Telephone Encounter (Signed)
Copied from [place] [id number]. Topic: Quick Communication - See Telephone Encounter >> Nov 05, 2018  4:49 PM Vernona Rieger wrote: CRM for notification. See Telephone encounter for: 11/05/18.  Ailene Ravel, Occupational Therapist with [facility] said that she seen him and his pain was 8 out of 10. She is inquiring if Dr Charlett Blake could send in a script for Tylenol. She would like that sent to [facility] in [place]. She said she will call back Thursday with the fax number when the office has re-opened.

## 2018-11-07 ENCOUNTER — Telehealth: Payer: Self-pay | Admitting: Family Medicine

## 2018-11-07 DIAGNOSIS — M545 Low back pain: Secondary | ICD-10-CM | POA: Diagnosis not present

## 2018-11-07 DIAGNOSIS — G8929 Other chronic pain: Secondary | ICD-10-CM | POA: Diagnosis not present

## 2018-11-07 DIAGNOSIS — N183 Chronic kidney disease, stage 3 (moderate): Secondary | ICD-10-CM | POA: Diagnosis not present

## 2018-11-07 DIAGNOSIS — I2510 Atherosclerotic heart disease of native coronary artery without angina pectoris: Secondary | ICD-10-CM | POA: Diagnosis not present

## 2018-11-07 DIAGNOSIS — R2681 Unsteadiness on feet: Secondary | ICD-10-CM | POA: Diagnosis not present

## 2018-11-07 DIAGNOSIS — M6281 Muscle weakness (generalized): Secondary | ICD-10-CM | POA: Diagnosis not present

## 2018-11-07 DIAGNOSIS — Z7982 Long term (current) use of aspirin: Secondary | ICD-10-CM | POA: Diagnosis not present

## 2018-11-07 DIAGNOSIS — I129 Hypertensive chronic kidney disease with stage 1 through stage 4 chronic kidney disease, or unspecified chronic kidney disease: Secondary | ICD-10-CM | POA: Diagnosis not present

## 2018-11-07 NOTE — Telephone Encounter (Signed)
Copied from [place] [phone]. Topic: Quick Communication - See Telephone Encounter >> Nov 07, 2018  3:39 PM Antonieta Iba C wrote: CRM for notification. See Telephone encounter for: 11/07/18.  Ailene Ravel OT w/ [facility] is calling in for VO for services.   Frequency: 2 week 1 and 1 week 1   She would also like to have a Rx for Tylenol for pt to fax # [phone] at [place]: [phone]

## 2018-11-07 NOTE — Telephone Encounter (Signed)
OK to give a prescription for him to take Tylenol ES 500 mg tabs, 1 tab po bid and may increase to 2 tabs po bid as needed for pain control

## 2018-11-08 MED ORDER — ACETAMINOPHEN 500 MG PO TABS: ORAL_TABLET | ORAL | 3 refills | Status: AC

## 2018-11-08 NOTE — Telephone Encounter (Signed)
Spoke w/ Ailene Ravel- verbal orders given. Informed Tylenol Rx faxed to facility.

## 2018-11-08 NOTE — Telephone Encounter (Signed)
Spoke w/ Ailene Ravel- Rx for Tylenol printed and faxed to facility at [phone].

## 2018-11-12 DIAGNOSIS — I129 Hypertensive chronic kidney disease with stage 1 through stage 4 chronic kidney disease, or unspecified chronic kidney disease: Secondary | ICD-10-CM | POA: Diagnosis not present

## 2018-11-12 DIAGNOSIS — I2510 Atherosclerotic heart disease of native coronary artery without angina pectoris: Secondary | ICD-10-CM | POA: Diagnosis not present

## 2018-11-12 DIAGNOSIS — M545 Low back pain: Secondary | ICD-10-CM | POA: Diagnosis not present

## 2018-11-12 DIAGNOSIS — G8929 Other chronic pain: Secondary | ICD-10-CM | POA: Diagnosis not present

## 2018-11-12 DIAGNOSIS — Z7982 Long term (current) use of aspirin: Secondary | ICD-10-CM | POA: Diagnosis not present

## 2018-11-12 DIAGNOSIS — M6281 Muscle weakness (generalized): Secondary | ICD-10-CM | POA: Diagnosis not present

## 2018-11-12 DIAGNOSIS — R2681 Unsteadiness on feet: Secondary | ICD-10-CM | POA: Diagnosis not present

## 2018-11-12 DIAGNOSIS — N183 Chronic kidney disease, stage 3 (moderate): Secondary | ICD-10-CM | POA: Diagnosis not present

## 2018-11-13 ENCOUNTER — Telehealth: Payer: Self-pay | Admitting: *Deleted

## 2018-11-13 DIAGNOSIS — G8929 Other chronic pain: Secondary | ICD-10-CM | POA: Diagnosis not present

## 2018-11-13 DIAGNOSIS — I129 Hypertensive chronic kidney disease with stage 1 through stage 4 chronic kidney disease, or unspecified chronic kidney disease: Secondary | ICD-10-CM | POA: Diagnosis not present

## 2018-11-13 DIAGNOSIS — Z7982 Long term (current) use of aspirin: Secondary | ICD-10-CM | POA: Diagnosis not present

## 2018-11-13 DIAGNOSIS — M6281 Muscle weakness (generalized): Secondary | ICD-10-CM | POA: Diagnosis not present

## 2018-11-13 DIAGNOSIS — M545 Low back pain: Secondary | ICD-10-CM | POA: Diagnosis not present

## 2018-11-13 DIAGNOSIS — R2681 Unsteadiness on feet: Secondary | ICD-10-CM | POA: Diagnosis not present

## 2018-11-13 DIAGNOSIS — I2510 Atherosclerotic heart disease of native coronary artery without angina pectoris: Secondary | ICD-10-CM | POA: Diagnosis not present

## 2018-11-13 DIAGNOSIS — N183 Chronic kidney disease, stage 3 (moderate): Secondary | ICD-10-CM | POA: Diagnosis not present

## 2018-11-13 NOTE — Telephone Encounter (Signed)
Received Physician Orders from [facility]; forwarded to provider/SLS 01/08

## 2018-11-15 DIAGNOSIS — I2510 Atherosclerotic heart disease of native coronary artery without angina pectoris: Secondary | ICD-10-CM | POA: Diagnosis not present

## 2018-11-15 DIAGNOSIS — M545 Low back pain: Secondary | ICD-10-CM | POA: Diagnosis not present

## 2018-11-15 DIAGNOSIS — R2681 Unsteadiness on feet: Secondary | ICD-10-CM | POA: Diagnosis not present

## 2018-11-15 DIAGNOSIS — N183 Chronic kidney disease, stage 3 (moderate): Secondary | ICD-10-CM | POA: Diagnosis not present

## 2018-11-15 DIAGNOSIS — I129 Hypertensive chronic kidney disease with stage 1 through stage 4 chronic kidney disease, or unspecified chronic kidney disease: Secondary | ICD-10-CM | POA: Diagnosis not present

## 2018-11-15 DIAGNOSIS — Z7982 Long term (current) use of aspirin: Secondary | ICD-10-CM | POA: Diagnosis not present

## 2018-11-15 DIAGNOSIS — M6281 Muscle weakness (generalized): Secondary | ICD-10-CM | POA: Diagnosis not present

## 2018-11-15 DIAGNOSIS — G8929 Other chronic pain: Secondary | ICD-10-CM | POA: Diagnosis not present

## 2018-11-19 ENCOUNTER — Emergency Department (HOSPITAL_COMMUNITY): Payer: Medicare HMO

## 2018-11-19 ENCOUNTER — Other Ambulatory Visit: Payer: Self-pay

## 2018-11-19 ENCOUNTER — Encounter (HOSPITAL_COMMUNITY): Payer: Self-pay | Admitting: Emergency Medicine

## 2018-11-19 DIAGNOSIS — J9690 Respiratory failure, unspecified, unspecified whether with hypoxia or hypercapnia: Secondary | ICD-10-CM | POA: Diagnosis not present

## 2018-11-19 DIAGNOSIS — R1310 Dysphagia, unspecified: Secondary | ICD-10-CM | POA: Diagnosis present

## 2018-11-19 DIAGNOSIS — N401 Enlarged prostate with lower urinary tract symptoms: Secondary | ICD-10-CM | POA: Diagnosis present

## 2018-11-19 DIAGNOSIS — G4700 Insomnia, unspecified: Secondary | ICD-10-CM | POA: Diagnosis present

## 2018-11-19 DIAGNOSIS — I129 Hypertensive chronic kidney disease with stage 1 through stage 4 chronic kidney disease, or unspecified chronic kidney disease: Secondary | ICD-10-CM | POA: Diagnosis not present

## 2018-11-19 DIAGNOSIS — I739 Peripheral vascular disease, unspecified: Secondary | ICD-10-CM | POA: Diagnosis present

## 2018-11-19 DIAGNOSIS — H903 Sensorineural hearing loss, bilateral: Secondary | ICD-10-CM | POA: Diagnosis present

## 2018-11-19 DIAGNOSIS — F419 Anxiety disorder, unspecified: Secondary | ICD-10-CM | POA: Diagnosis present

## 2018-11-19 DIAGNOSIS — Z66 Do not resuscitate: Secondary | ICD-10-CM | POA: Diagnosis not present

## 2018-11-19 DIAGNOSIS — Z515 Encounter for palliative care: Secondary | ICD-10-CM

## 2018-11-19 DIAGNOSIS — R6511 Systemic inflammatory response syndrome (SIRS) of non-infectious origin with acute organ dysfunction: Secondary | ICD-10-CM | POA: Diagnosis present

## 2018-11-19 DIAGNOSIS — Z9911 Dependence on respirator [ventilator] status: Secondary | ICD-10-CM

## 2018-11-19 DIAGNOSIS — J9601 Acute respiratory failure with hypoxia: Secondary | ICD-10-CM | POA: Diagnosis present

## 2018-11-19 DIAGNOSIS — I712 Thoracic aortic aneurysm, without rupture: Secondary | ICD-10-CM | POA: Diagnosis present

## 2018-11-19 DIAGNOSIS — Z79899 Other long term (current) drug therapy: Secondary | ICD-10-CM

## 2018-11-19 DIAGNOSIS — R0902 Hypoxemia: Secondary | ICD-10-CM | POA: Diagnosis not present

## 2018-11-19 DIAGNOSIS — I69391 Dysphagia following cerebral infarction: Secondary | ICD-10-CM

## 2018-11-19 DIAGNOSIS — Z7189 Other specified counseling: Secondary | ICD-10-CM | POA: Diagnosis not present

## 2018-11-19 DIAGNOSIS — Z7982 Long term (current) use of aspirin: Secondary | ICD-10-CM

## 2018-11-19 DIAGNOSIS — E860 Dehydration: Secondary | ICD-10-CM | POA: Diagnosis present

## 2018-11-19 DIAGNOSIS — Z9841 Cataract extraction status, right eye: Secondary | ICD-10-CM

## 2018-11-19 DIAGNOSIS — E876 Hypokalemia: Secondary | ICD-10-CM | POA: Diagnosis present

## 2018-11-19 DIAGNOSIS — R410 Disorientation, unspecified: Secondary | ICD-10-CM | POA: Diagnosis not present

## 2018-11-19 DIAGNOSIS — K449 Diaphragmatic hernia without obstruction or gangrene: Secondary | ICD-10-CM | POA: Diagnosis present

## 2018-11-19 DIAGNOSIS — Z87891 Personal history of nicotine dependence: Secondary | ICD-10-CM

## 2018-11-19 DIAGNOSIS — R918 Other nonspecific abnormal finding of lung field: Secondary | ICD-10-CM | POA: Diagnosis not present

## 2018-11-19 DIAGNOSIS — R338 Other retention of urine: Secondary | ICD-10-CM | POA: Diagnosis present

## 2018-11-19 DIAGNOSIS — H409 Unspecified glaucoma: Secondary | ICD-10-CM | POA: Diagnosis present

## 2018-11-19 DIAGNOSIS — R262 Difficulty in walking, not elsewhere classified: Secondary | ICD-10-CM | POA: Diagnosis present

## 2018-11-19 DIAGNOSIS — I214 Non-ST elevation (NSTEMI) myocardial infarction: Principal | ICD-10-CM | POA: Diagnosis present

## 2018-11-19 DIAGNOSIS — E785 Hyperlipidemia, unspecified: Secondary | ICD-10-CM | POA: Diagnosis present

## 2018-11-19 DIAGNOSIS — Z951 Presence of aortocoronary bypass graft: Secondary | ICD-10-CM

## 2018-11-19 DIAGNOSIS — G9341 Metabolic encephalopathy: Secondary | ICD-10-CM | POA: Diagnosis not present

## 2018-11-19 DIAGNOSIS — R404 Transient alteration of awareness: Secondary | ICD-10-CM | POA: Diagnosis not present

## 2018-11-19 DIAGNOSIS — Z8249 Family history of ischemic heart disease and other diseases of the circulatory system: Secondary | ICD-10-CM

## 2018-11-19 DIAGNOSIS — R509 Fever, unspecified: Secondary | ICD-10-CM | POA: Diagnosis not present

## 2018-11-19 DIAGNOSIS — R451 Restlessness and agitation: Secondary | ICD-10-CM | POA: Diagnosis not present

## 2018-11-19 DIAGNOSIS — K219 Gastro-esophageal reflux disease without esophagitis: Secondary | ICD-10-CM | POA: Diagnosis present

## 2018-11-19 DIAGNOSIS — Z9842 Cataract extraction status, left eye: Secondary | ICD-10-CM

## 2018-11-19 DIAGNOSIS — I2510 Atherosclerotic heart disease of native coronary artery without angina pectoris: Secondary | ICD-10-CM | POA: Diagnosis present

## 2018-11-19 DIAGNOSIS — Z86010 Personal history of colonic polyps: Secondary | ICD-10-CM

## 2018-11-19 DIAGNOSIS — S32009A Unspecified fracture of unspecified lumbar vertebra, initial encounter for closed fracture: Secondary | ICD-10-CM | POA: Diagnosis not present

## 2018-11-19 DIAGNOSIS — N179 Acute kidney failure, unspecified: Secondary | ICD-10-CM | POA: Diagnosis present

## 2018-11-19 DIAGNOSIS — I959 Hypotension, unspecified: Secondary | ICD-10-CM | POA: Diagnosis not present

## 2018-11-19 DIAGNOSIS — I10 Essential (primary) hypertension: Secondary | ICD-10-CM | POA: Diagnosis not present

## 2018-11-19 DIAGNOSIS — N182 Chronic kidney disease, stage 2 (mild): Secondary | ICD-10-CM | POA: Diagnosis present

## 2018-11-19 DIAGNOSIS — J810 Acute pulmonary edema: Secondary | ICD-10-CM | POA: Diagnosis not present

## 2018-11-19 LAB — TROPONIN I
TROPONIN I: 1.08 ng/mL — AB (ref <0.03)
Troponin I: 0.62 ng/mL (ref <0.03)
Troponin I: 1.23 ng/mL (ref <0.03)

## 2018-11-19 LAB — CBC WITH DIFFERENTIAL/PLATELET
Abs Immature Granulocytes: 0.12 10*3/uL — ABNORMAL HIGH (ref 0.00–0.07)
BASOS ABS: 0.1 10*3/uL (ref 0.0–0.1)
Basophils Relative: 0 %
EOS PCT: 3 %
Eosinophils Absolute: 0.6 10*3/uL — ABNORMAL HIGH (ref 0.0–0.5)
HEMATOCRIT: 36 % — AB (ref 39.0–52.0)
HEMOGLOBIN: 10.9 g/dL — AB (ref 13.0–17.0)
Immature Granulocytes: 1 %
LYMPHS ABS: 3.2 10*3/uL (ref 0.7–4.0)
LYMPHS PCT: 17 %
MCH: 29 pg (ref 26.0–34.0)
MCHC: 30.3 g/dL (ref 30.0–36.0)
MCV: 95.7 fL (ref 80.0–100.0)
MONO ABS: 1.2 10*3/uL — AB (ref 0.1–1.0)
MONOS PCT: 7 %
NRBC: 0 % (ref 0.0–0.2)
Neutro Abs: 13.2 10*3/uL — ABNORMAL HIGH (ref 1.7–7.7)
Neutrophils Relative %: 72 %
Platelets: 316 10*3/uL (ref 150–400)
RBC: 3.76 MIL/uL — ABNORMAL LOW (ref 4.22–5.81)
RDW: 14.4 % (ref 11.5–15.5)
WBC: 18.3 10*3/uL — ABNORMAL HIGH (ref 4.0–10.5)

## 2018-11-19 LAB — MRSA PCR SCREENING: MRSA by PCR: NEGATIVE

## 2018-11-19 LAB — I-STAT ARTERIAL BLOOD GAS, ED
Acid-base deficit: 4 mmol/L — ABNORMAL HIGH (ref 0.0–2.0)
Bicarbonate: 23 mmol/L (ref 20.0–28.0)
O2 Saturation: 97 %
PH ART: 7.312 — AB (ref 7.350–7.450)
TCO2: 24 mmol/L (ref 22–32)
pCO2 arterial: 45 mmHg (ref 32.0–48.0)
pO2, Arterial: 94 mmHg (ref 83.0–108.0)

## 2018-11-19 LAB — COMPREHENSIVE METABOLIC PANEL
ALK PHOS: 48 U/L (ref 38–126)
ALT: 7 U/L (ref 0–44)
ANION GAP: 13 (ref 5–15)
AST: 17 U/L (ref 15–41)
Albumin: 2.6 g/dL — ABNORMAL LOW (ref 3.5–5.0)
BILIRUBIN TOTAL: 0.7 mg/dL (ref 0.3–1.2)
BUN: 43 mg/dL — ABNORMAL HIGH (ref 8–23)
CALCIUM: 8.6 mg/dL — AB (ref 8.9–10.3)
CO2: 17 mmol/L — ABNORMAL LOW (ref 22–32)
Chloride: 103 mmol/L (ref 98–111)
Creatinine, Ser: 1.8 mg/dL — ABNORMAL HIGH (ref 0.61–1.24)
GFR calc Af Amer: 38 mL/min — ABNORMAL LOW (ref >60)
GFR, EST NON AFRICAN AMERICAN: 33 mL/min — AB (ref >60)
GLUCOSE: 229 mg/dL — AB (ref 70–99)
Potassium: 4.6 mmol/L (ref 3.5–5.1)
Sodium: 133 mmol/L — ABNORMAL LOW (ref 135–145)
TOTAL PROTEIN: 6.2 g/dL — AB (ref 6.5–8.1)

## 2018-11-19 LAB — BASIC METABOLIC PANEL
Anion gap: 12 (ref 5–15)
BUN: 48 mg/dL — AB (ref 8–23)
CO2: 21 mmol/L — ABNORMAL LOW (ref 22–32)
Calcium: 8.6 mg/dL — ABNORMAL LOW (ref 8.9–10.3)
Chloride: 105 mmol/L (ref 98–111)
Creatinine, Ser: 1.88 mg/dL — ABNORMAL HIGH (ref 0.61–1.24)
GFR calc Af Amer: 36 mL/min — ABNORMAL LOW (ref >60)
GFR calc non Af Amer: 31 mL/min — ABNORMAL LOW (ref >60)
Glucose, Bld: 122 mg/dL — ABNORMAL HIGH (ref 70–99)
POTASSIUM: 5 mmol/L (ref 3.5–5.1)
Sodium: 138 mmol/L (ref 135–145)

## 2018-11-19 LAB — I-STAT TROPONIN, ED: Troponin i, poc: 0.2 ng/mL (ref 0.00–0.08)

## 2018-11-19 LAB — I-STAT CG4 LACTIC ACID, ED: Lactic Acid, Venous: 4.68 mmol/L (ref 0.5–1.9)

## 2018-11-19 LAB — MAGNESIUM: Magnesium: 1.8 mg/dL (ref 1.7–2.4)

## 2018-11-19 LAB — TRIGLYCERIDES: Triglycerides: 56 mg/dL (ref <150)

## 2018-11-19 LAB — BRAIN NATRIURETIC PEPTIDE: B Natriuretic Peptide: 2619 pg/mL — ABNORMAL HIGH (ref 0.0–100.0)

## 2018-11-19 LAB — PHOSPHORUS: Phosphorus: 3.7 mg/dL (ref 2.5–4.6)

## 2018-11-19 MED ORDER — FENTANYL CITRATE (PF) 100 MCG/2ML IJ SOLN
INTRAMUSCULAR | Status: AC
  Administered 2018-11-19: 07:00:00
  Filled 2018-11-19: qty 2

## 2018-11-19 MED ORDER — FUROSEMIDE 10 MG/ML IJ SOLN
120.0000 mg | Freq: Once | INTRAVENOUS | Status: AC
  Administered 2018-11-19: 120 mg via INTRAVENOUS
  Filled 2018-11-19: qty 2

## 2018-11-19 MED ORDER — FUROSEMIDE 10 MG/ML IJ SOLN
40.0000 mg | Freq: Once | INTRAMUSCULAR | Status: AC
  Administered 2018-11-19: 40 mg via INTRAVENOUS
  Filled 2018-11-19: qty 4

## 2018-11-19 MED ORDER — SUCCINYLCHOLINE CHLORIDE 20 MG/ML IJ SOLN
INTRAMUSCULAR | Status: AC | PRN
  Administered 2018-11-19: 100 mg via INTRAVENOUS

## 2018-11-19 MED ORDER — CHLORHEXIDINE GLUCONATE 0.12 % MT SOLN
15.0000 mL | Freq: Two times a day (BID) | OROMUCOSAL | Status: DC
  Administered 2018-11-19 – 2018-11-20 (×2): 15 mL via OROMUCOSAL
  Filled 2018-11-19: qty 15

## 2018-11-19 MED ORDER — FENTANYL CITRATE (PF) 100 MCG/2ML IJ SOLN
100.0000 ug | Freq: Once | INTRAMUSCULAR | Status: AC
  Administered 2018-11-19: 100 ug via INTRAVENOUS
  Filled 2018-11-19: qty 2

## 2018-11-19 MED ORDER — CARVEDILOL 12.5 MG PO TABS
12.5000 mg | ORAL_TABLET | Freq: Two times a day (BID) | ORAL | Status: DC
  Administered 2018-11-19 – 2018-11-23 (×6): 12.5 mg via ORAL
  Filled 2018-11-19 (×7): qty 1

## 2018-11-19 MED ORDER — ORAL CARE MOUTH RINSE
15.0000 mL | OROMUCOSAL | Status: DC
  Administered 2018-11-20 – 2018-11-22 (×15): 15 mL via OROMUCOSAL

## 2018-11-19 MED ORDER — FENTANYL 2500MCG IN NS 250ML (10MCG/ML) PREMIX INFUSION: 0.0000 ug/h | INTRAVENOUS | Status: DC

## 2018-11-19 MED ORDER — FUROSEMIDE 10 MG/ML IJ SOLN
80.0000 mg | Freq: Once | INTRAMUSCULAR | Status: AC
  Administered 2018-11-20: 80 mg via INTRAVENOUS
  Filled 2018-11-19: qty 8

## 2018-11-19 MED ORDER — PROPOFOL 1000 MG/100ML IV EMUL
INTRAVENOUS | Status: AC
  Filled 2018-11-19: qty 100

## 2018-11-19 MED ORDER — ETOMIDATE 2 MG/ML IV SOLN
INTRAVENOUS | Status: AC | PRN
  Administered 2018-11-19: 20 mg via INTRAVENOUS

## 2018-11-19 MED ORDER — DEXMEDETOMIDINE HCL IN NACL 200 MCG/50ML IV SOLN
0.2000 ug/kg/h | INTRAVENOUS | Status: DC
  Administered 2018-11-19: 0.4 ug/kg/h via INTRAVENOUS
  Administered 2018-11-19: 0.6 ug/kg/h via INTRAVENOUS
  Administered 2018-11-19: 0.2 ug/kg/h via INTRAVENOUS
  Administered 2018-11-20: 0.3 ug/kg/h via INTRAVENOUS
  Administered 2018-11-20 (×2): 1 ug/kg/h via INTRAVENOUS
  Filled 2018-11-19 (×6): qty 50

## 2018-11-19 MED ORDER — FENTANYL CITRATE (PF) 100 MCG/2ML IJ SOLN
50.0000 ug | INTRAMUSCULAR | Status: DC | PRN
  Administered 2018-11-19 (×2): 50 ug via INTRAVENOUS
  Filled 2018-11-19 (×2): qty 2

## 2018-11-19 MED ORDER — ENOXAPARIN SODIUM 30 MG/0.3ML ~~LOC~~ SOLN: 30.0000 mg | SUBCUTANEOUS | Status: DC

## 2018-11-19 MED ORDER — FENTANYL CITRATE (PF) 100 MCG/2ML IJ SOLN
50.0000 ug | INTRAMUSCULAR | Status: DC | PRN
  Administered 2018-11-19 – 2018-11-20 (×5): 50 ug via INTRAVENOUS
  Filled 2018-11-19 (×3): qty 2

## 2018-11-19 MED ORDER — SODIUM CHLORIDE 0.9 % IV SOLN
INTRAVENOUS | Status: AC | PRN
  Administered 2018-11-19: 1000 mL via INTRAVENOUS

## 2018-11-19 MED ORDER — ALBUTEROL SULFATE (2.5 MG/3ML) 0.083% IN NEBU: 2.5000 mg | INHALATION_SOLUTION | RESPIRATORY_TRACT | Status: DC | PRN

## 2018-11-19 MED ORDER — ENOXAPARIN SODIUM 30 MG/0.3ML ~~LOC~~ SOLN
30.0000 mg | SUBCUTANEOUS | Status: DC
  Administered 2018-11-19 – 2018-11-23 (×5): 30 mg via SUBCUTANEOUS
  Filled 2018-11-19 (×5): qty 0.3

## 2018-11-19 MED ORDER — CHLORHEXIDINE GLUCONATE 0.12% ORAL RINSE (MEDLINE KIT)
15.0000 mL | Freq: Two times a day (BID) | OROMUCOSAL | Status: DC
  Administered 2018-11-20: 15 mL via OROMUCOSAL

## 2018-11-19 MED ORDER — PANTOPRAZOLE SODIUM 40 MG IV SOLR
40.0000 mg | Freq: Every day | INTRAVENOUS | Status: DC
  Administered 2018-11-19 – 2018-11-21 (×3): 40 mg via INTRAVENOUS
  Filled 2018-11-19 (×3): qty 40

## 2018-11-19 MED ORDER — PROPOFOL 1000 MG/100ML IV EMUL
0.0000 ug/kg/min | INTRAVENOUS | Status: DC
  Administered 2018-11-19: 5 ug/kg/min via INTRAVENOUS

## 2018-11-19 MED ORDER — FUROSEMIDE 10 MG/ML IJ SOLN
80.0000 mg | Freq: Once | INTRAMUSCULAR | Status: AC
  Administered 2018-11-19: 80 mg via INTRAVENOUS
  Filled 2018-11-19: qty 8

## 2018-11-19 MED ORDER — METOLAZONE 5 MG PO TABS
5.0000 mg | ORAL_TABLET | Freq: Once | ORAL | Status: AC
  Administered 2018-11-19: 5 mg via ORAL
  Filled 2018-11-19: qty 1

## 2018-11-19 NOTE — ED Provider Notes (Signed)
[facility] EMERGENCY DEPARTMENT Provider Note   CSN: [id number] Arrival date & time: 11/23/2018  0423     History   Chief Complaint Chief Complaint  Patient presents with  . Shortness of Breath    HPI Troy Ray is a 85 y.o. male.  Patient presents to the emergency department for evaluation of difficulty breathing.  Patient comes by ambulance from nursing home.  Patient was with his son yesterday evening.  He had been doing well, he reported that he felt tired and was going to take a nap and his son left him around 3 or 4 PM.  Nursing home staff report that tonight he called out and reported that he was having difficulty breathing.  They called an ambulance.  By the time the ambulance got to the nursing home, patient was in significant respiratory distress.  Transported to the ER, breathing became agonal during transportation, patient arrives being assisted by bag valve mask. Level V Caveat due to acuity.     Past Medical History:  Diagnosis Date  . Adenomatous colon polyp   . BPH (benign prostatic hyperplasia) 07/14/2013  . CAD (coronary artery disease)   . Carotid stenosis, bilateral    right 60-79% stenosed.  Left 40-59%  . Chronic kidney disease    chronic, stage II  . CRI (chronic renal insufficiency)   . Diverticulosis   . Dysphagia   . Esophageal stricture    Dr [name]  . Glaucoma   . Headache(784.0) 12/01/2013  . Hyperlipidemia   . Hypertension   . Insomnia 05/12/2015  . Low back pain syndrome 04/03/2014  . Medicare annual wellness visit, subsequent 02/04/2015  . Memory loss   . Postural lightheadedness   . PVD (peripheral vascular disease) ([place])   . Renal insufficiency   . Sensorineural hearing loss, bilateral   . Unspecified constipation 12/29/2012    Patient Active Problem List   Diagnosis Date Noted  . Foot pain, bilateral 10/19/2018  . Hyponatremia 09/18/2018  . Moderate protein malnutrition ([place]) 08/31/2018  . Hyperkalemia  08/31/2018  . Descending thoracic aortic aneurysm ([place]) 08/31/2018  . Acute metabolic encephalopathy 44/11/270  . Pressure injury of skin 08/14/2018  . Back pain 08/09/2018  . Vertebral fracture, osteoporotic ([place]) 07/23/2018  . T6 vertebral fracture ([place]) 09/12/2017  . Leukocytosis 09/12/2017  . Sun-damaged skin 06/27/2017  . Gait disturbance 05/08/2017  . Low back pain 05/08/2017  . Anorexia 12/12/2015  . Skin lesion of left arm 05/12/2015  . Gastroesophageal reflux disease without esophagitis 05/12/2015  . Insomnia 05/12/2015  . Preventative health care 02/04/2015  . Glaucoma 01/25/2015  . Left-sided low back pain with left-sided sciatica 04/03/2014  . Apnea 01/25/2014  . Headache 12/01/2013  . Sinusitis 10/13/2013  . BPH (benign prostatic hyperplasia) 07/14/2013  . Constipation 12/29/2012  . LACK OF COORDINATION 11/17/2010  . Hyperglycemia 11/17/2010  . PERSONAL HISTORY OF COLONIC POLYPS 09/21/2009  . Carotid artery disease ([place]) 05/11/2009  . MEMORY LOSS 04/06/2009  . Hyperlipidemia, mixed 12/07/2008  . Essential hypertension 12/07/2008  . Coronary atherosclerosis 12/07/2008  . History of AAA (abdominal aortic aneurysm) repair 12/04/2008  . CHRONIC KIDNEY DISEASE STAGE II (MILD) 12/04/2008  . ESOPHAGEAL STRICTURE 05/29/2008  . OTHER DYSPHAGIA 05/29/2008    Past Surgical History:  Procedure Laterality Date  . ABDOMINAL AORTIC ANEURYSM REPAIR  1996   Dr [name]  . CORONARY ARTERY BYPASS GRAFT    . EYE SURGERY     cataracts b/l and laser  .  HERNIA REPAIR  03/23/05   left inguinal Lichtenstein repair, with mesh.  [name]  . IR [facility] THORACIC WITH BONE BIOPSY  11/08/2017  . IR RADIOLOGIST EVAL & MGMT  10/22/2017  . IR VERTEBROPLASTY CERV/THOR BX INC UNI/BIL INC/INJECT/IMAGING  06/25/2018  . RENAL ARTERY STENT  1996   Dr [name].  Bilateral stenting        Home Medications    Prior to Admission medications   Medication Sig Start Date End Date Taking?  Authorizing Provider  acetaminophen (TYLENOL) 500 MG tablet Take 1 tablet by mouth twice daily as needed, okay to increase to 2 tablets by mouth twice daily if needed 11/08/18   [name], MD  amLODipine (NORVASC) 10 MG tablet Take 1 tablet (10 mg total) by mouth daily. 09/18/18   Hennie Duos, MD  aspirin EC 81 MG tablet Take 1 tablet (81 mg total) by mouth at bedtime. 09/18/18   Hennie Duos, MD  atorvastatin (LIPITOR) 40 MG tablet Take 1 tablet (40 mg total) by mouth daily. 09/18/18   Hennie Duos, MD  carvedilol (COREG) 12.5 MG tablet Take 1 tablet (12.5 mg total) by mouth 2 (two) times daily with a meal. 09/18/18   Hennie Duos, MD  feeding supplement, ENSURE ENLIVE, (ENSURE ENLIVE) LIQD Take 237 mLs by mouth 3 (three) times daily between meals. 09/18/18   Hennie Duos, MD  ferrous sulfate 325 (65 FE) MG tablet Take 1 tablet (325 mg total) by mouth 2 (two) times daily with a meal. 09/18/18   Hennie Duos, MD  finasteride (PROSCAR) 5 MG tablet Take 1 tablet (5 mg total) by mouth daily. 09/18/18   Hennie Duos, MD  latanoprost (XALATAN) 0.005 % ophthalmic solution Place 1 drop into both eyes at bedtime. 09/18/18   Hennie Duos, MD  losartan (COZAAR) 100 MG tablet Take 1 tablet (100 mg total) by mouth 2 (two) times daily. 09/18/18   Hennie Duos, MD  omega-3 acid ethyl esters (LOVAZA) 1 g capsule Take 2 capsules (2 g total) by mouth 2 (two) times daily. 09/18/18   Hennie Duos, MD  pantoprazole (PROTONIX) 40 MG tablet Take 1 tablet (40 mg total) by mouth every morning. 09/18/18   Hennie Duos, MD  polyethylene glycol [facility] / Floria Raveling) packet Take 17 g by mouth daily. 09/18/18   Hennie Duos, MD  sodium bicarbonate 650 MG tablet Take 1 tablet (650 mg total) by mouth 3 (three) times daily. 1 tablet by mouth with meals 09/18/18   Hennie Duos, MD  timolol (TIMOPTIC) 0.5 % ophthalmic solution Place 1 drop into both eyes daily.  09/18/18   Hennie Duos, MD    Family History Family History  Problem Relation Age of Onset  . Heart disease Father        CAD  . Coronary artery disease Father   . Stroke Father   . Hearing loss Father   . Hearing loss Paternal Grandfather   . Heart disease Son   . Heart disease Brother        MI  . Cancer Neg Hx   . Diabetes Neg Hx     Social History Social History   Tobacco Use  . Smoking status: Former Research scientist (life sciences)  . Smokeless tobacco: Never Used  . Tobacco comment: quit 1980  Substance Use Topics  . Alcohol use: Yes    Alcohol/week: 1.0 standard drinks    Types: 1 Glasses of  wine per week  . Drug use: No     Allergies   Patient has no known allergies.   Review of Systems Review of Systems  Unable to perform ROS: Acuity of condition     Physical Exam Updated Vital Signs BP (!) 125/55   Pulse 63   Temp (!) 97.3 F (36.3 C) (Rectal)   Resp 18   Ht 5\' 9"  (1.753 m)   SpO2 100%   BMI 17.72 kg/m   Physical Exam Constitutional:      General: He is in acute distress.     Appearance: He is ill-appearing.  HENT:     Head: Atraumatic.  Eyes:     Pupils: Pupils are equal, round, and reactive to light.  Neck:     Musculoskeletal: Neck supple.  Cardiovascular:     Rate and Rhythm: Normal rate and regular rhythm.     Heart sounds: Normal heart sounds.  Pulmonary:     Breath sounds: Decreased breath sounds present.     Comments: Diffuse rales Abdominal:     Palpations: Abdomen is soft.  Musculoskeletal:     Right shoulder: He exhibits no deformity.  Skin:    General: Skin is dry.  Neurological:     Comments: Patient does not follow any commands or respond to voice.      ED Treatments / Results  Labs (all labs ordered are listed, but only abnormal results are displayed) Labs Reviewed  CBC WITH DIFFERENTIAL/PLATELET - Abnormal; Notable for the following components:      Result Value   WBC 18.3 (*)    RBC 3.76 (*)    Hemoglobin 10.9 (*)     HCT 36.0 (*)    Neutro Abs 13.2 (*)    Monocytes Absolute 1.2 (*)    Eosinophils Absolute 0.6 (*)    Abs Immature Granulocytes 0.12 (*)    All other components within normal limits  COMPREHENSIVE METABOLIC PANEL - Abnormal; Notable for the following components:   Sodium 133 (*)    CO2 17 (*)    Glucose, Bld 229 (*)    BUN 43 (*)    Creatinine, Ser 1.80 (*)    Calcium 8.6 (*)    Total Protein 6.2 (*)    Albumin 2.6 (*)    GFR calc non Af Amer 33 (*)    GFR calc Af Amer 38 (*)    All other components within normal limits  BRAIN NATRIURETIC PEPTIDE - Abnormal; Notable for the following components:   B Natriuretic Peptide 2,619.0 (*)    All other components within normal limits  I-STAT CG4 LACTIC ACID, ED - Abnormal; Notable for the following components:   Lactic Acid, Venous 4.68 (*)    All other components within normal limits  I-STAT TROPONIN, ED - Abnormal; Notable for the following components:   Troponin i, poc 0.20 (*)    All other components within normal limits  I-STAT ARTERIAL BLOOD GAS, ED - Abnormal; Notable for the following components:   pH, Arterial 7.312 (*)    Acid-base deficit 4.0 (*)    All other components within normal limits  URINALYSIS, ROUTINE W REFLEX MICROSCOPIC    EKG None  Radiology Dg Chest Portable 1 View  Result Date: 12/01/2018 CLINICAL DATA:  85 y/o M; initial complaint of shortness of breath. Post intubation. EXAM: PORTABLE CHEST 1 VIEW COMPARISON:  08/08/2018 CT chest. FINDINGS: Normal cardiac silhouette. Aortic atherosclerosis with calcification. Central predominant perihilar opacities and reticular opacities of the  lungs. Large hiatal hernia. Enteric tube tip projects over the left lung base, likely within the herniated stomach. Endotracheal tube tip projects 4.3 cm above the carina. No acute osseous abnormality is evident. IMPRESSION: 1. Central predominant patchy opacities and diffuse reticular opacities, likely interstitial and alveolar  pulmonary edema. Underlying pneumonia is possible. 2. Large hiatal hernia. Enteric tube tip projects over the left lung base, probably within the herniated stomach. 3. Endotracheal tube tip projects 4.3 cm above the carina. Electronically Signed   By: Kristine Garbe M.D.   On: 11/13/2018 05:02    Procedures Procedures (including critical care time)  Medications Ordered in ED Medications  etomidate (AMIDATE) injection (20 mg Intravenous Given 11/26/2018 0429)  succinylcholine (ANECTINE) injection (100 mg Intravenous Given 11/06/2018 0429)  0.9 %  sodium chloride infusion ( Intravenous Stopped 11/09/2018 0600)  fentaNYL (SUBLIMAZE) injection 100 mcg (100 mcg Intravenous Given 11/25/2018 0449)  furosemide (LASIX) injection 40 mg (40 mg Intravenous Given 11/20/2018 0511)  fentaNYL (SUBLIMAZE) injection 100 mcg (100 mcg Intravenous Given 11/26/2018 7564)     Initial Impression / Assessment and Plan / ED Course  I have reviewed the triage vital signs and the nursing notes.  Pertinent labs & imaging results that were available during my care of the patient were reviewed by me and considered in my medical decision making (see chart for details).     Patient presents to the emergency department for evaluation of difficulty breathing.  Patient started to have difficulty breathing at the nursing home tonight and was transported by ambulance.  During transport he decompensated and was being bagged at arrival.  Patient son was present here in the ER.  I did have a conversation with him about CODE STATUS.  He confirmed that patient does not have a DNR.  After discussing the fact that the patient was in severe respiratory distress and his oxygen saturations were around 50%, so I did report that he wanted to be intubated.  He does, however, state that he does not want to escalate care.  No CPR, no shocks.  Patient was intubated at arrival.  Work-up has revealed evidence of pulmonary edema.  Patient administered  Lasix.  He is doing better on positive pressure ventilation.  Will be admitted to ICU.  CRITICAL CARE Performed by: Orpah Greek   Total critical care time: 35 minutes  Critical care time was exclusive of separately billable procedures and treating other patients.  Critical care was necessary to treat or prevent imminent or life-threatening deterioration.  Critical care was time spent personally by me on the following activities: development of treatment plan with patient and/or surrogate as well as nursing, discussions with consultants, evaluation of patient's response to treatment, examination of patient, obtaining history from patient or surrogate, ordering and performing treatments and interventions, ordering and review of laboratory studies, ordering and review of radiographic studies, pulse oximetry and re-evaluation of patient's condition.   Final Clinical Impressions(s) / ED Diagnoses   Final diagnoses:  Acute pulmonary edema ([place])  Acute respiratory failure with hypoxia [facility])    ED Discharge Orders    None       Betsey Holiday Gwenyth Allegra, MD 11/28/2018 0710

## 2018-11-19 NOTE — ED Notes (Signed)
Pt pulling at tube with eyes wide open. Propofol increased.

## 2018-11-19 NOTE — ED Notes (Signed)
ICU MD informed this RN to increase pt's NS rate to 55ml/hr at the max for BP. BP 72/44 right now, propofol is off. Family is more interested in keeping the pt comfortable, pt is DNR but will remain intubated for now. If pt's BP drops, no measures will be taken past 61ml/hr NS

## 2018-11-19 NOTE — H&P (Addendum)
NAME:  Troy Ray, MRN:  [id number], DOB:  April 30, 1928, LOS: 0 ADMISSION DATE:  11/16/2018, CONSULTATION DATE:  1/14 REFERRING MD:  Betsey Holiday - EM, CHIEF COMPLAINT:  SOB   Brief History   85 year old male presenting from assisted living facility via EMS for SOB.   History of present illness   85 yo M with PMH CVA CKD, CAD, Dysphagia, HTN, BPH, HLD who presented to [facility] ED 1/14 from assisted living facility via EMS for SOB. Patient history acquired from adult son, who reports the patient's symptoms onset abruptly yesterday and denied recent chills, weight loss, chest pain, dysuria but endorsed weakness.  On EMS arrival the patient was awake, alert. Patient progressively declined, exhibiting agonal breathing and was bagged to ED. Patient subsequently intubated in ED.  On presentation to ED, CXR noted to have bilateral opacities likely reflective of pulmonary edema and BNP > 2000. Lasix ordered in ED.  PCCM consulted for admission.   Past Medical History  CVA CKD CAD HTN HLD BPH Dysphagia  Significant Hospital Events   1/14> admitted  Consults:  PCCM  Procedures:  Intubated 1/14>>   Significant Diagnostic Tests:  CXR 1/14> bilateral lung opacities, large hiatal hernia.  -- personally reviewed  Micro Data:  UA/UCx 1/14>>  BCx 1/14>> Sputum Cx 1/14>>   Antimicrobials:    Interim history/subjective:  Intubated in ED for respiratory failure.   Objective   Blood pressure 107/79, pulse 66, temperature (!) 97.3 F (36.3 C), temperature source Rectal, resp. rate 12, height 5\' 9"  (1.753 m), SpO2 100 %.    Vent Mode: PRVC FiO2 (%):  [100 %] 100 % Set Rate:  [18 bmp] 18 bmp Vt Set:  [550 mL] 550 mL PEEP:  [5 cmH20-8 cmH20] 8 cmH20 Plateau Pressure:  [19 cmH20] 19 cmH20   Intake/Output Summary (Last 24 hours) at 11/29/2018 0737 Last data filed at 12/02/2018 0600 Gross per 24 hour  Intake 1000 ml  Output -  Net 1000 ml   There were no vitals filed for this  visit.  Examination: General: thin older adult male, intubated, mildly agitated, NAD HENT: [place]/AT anicteric sclera, trachea midline  Lungs: ETT secure, bilateral course crackles R>L  Cardiovascular: RRR, no r/g/m,  1+ radial pulses bilaterally Abdomen: soft, flat, non-distended, non-tender  Extremities: symmetrical bulk and tone. No peripheral edema  Neuro: Awake, agitated. GU: foley   Resolved Hospital Problem list     Assessment & Plan:    Acute respiratory failure requiring mechanical ventilation -pulmonary edema -BNP >2000 -Consideration given to PNA as etiology given WBC, however given history and CXR, do not favor PNA at this time.  P PRVC PEEP 8 RR 18 Titrate FiO2/PEEP for goal SpO2 >92% Lasix 40 Strict I/O  Follow BMP PRN albuterol CXR in AM  Leukocytosis Sepsis vs reactive  -PNA not favored at this time as outlined above  P UA/Urine Cx Blood Cx x2 Sputum Cx Trend WBC Trend temp  CKD P Trend Cr, strict I/O Pharm dosing of nephrotoxic agents if needed Maintain adequate renal perfusion (MAPs >65)  HTN P Holding home antihypertensive agents (norvasc, losartan Goal MAPs > 65  BPH -home proscar P Foley catheter Holding proscar  Dysphagia -in setting of past CVA  P NPO at this time When extubated, speech swallow eval    Best practice:  Diet: NPO  Pain/Anxiety/Delirium protocol (if indicated): fentanyl, propofol  VAP protocol (if indicated): yes DVT prophylaxis: lovenox GI prophylaxis: protonix  Glucose control: monitor Mobility: bedrest  Code Status: DNR Family Communication: Adult son at bedside.  Disposition: admit to ICU   Labs   CBC: Recent Labs  Lab 12/06/2018 0444  WBC 18.3*  NEUTROABS 13.2*  HGB 10.9*  HCT 36.0*  MCV 95.7  PLT 956    Basic Metabolic Panel: Recent Labs  Lab 11/13/2018 0444  NA 133*  K 4.6  CL 103  CO2 17*  GLUCOSE 229*  BUN 43*  CREATININE 1.80*  CALCIUM 8.6*   GFR: CrCl cannot be calculated  (Unknown ideal weight.). Recent Labs  Lab 11/30/2018 0441 11/15/2018 0444  WBC  --  18.3*  LATICACIDVEN 4.68*  --     Liver Function Tests: Recent Labs  Lab 11/09/2018 0444  AST 17  ALT 7  ALKPHOS 48  BILITOT 0.7  PROT 6.2*  ALBUMIN 2.6*   No results for input(s): LIPASE, AMYLASE in the last 168 hours. No results for input(s): AMMONIA in the last 168 hours.  ABG    Component Value Date/Time   PHART 7.312 (L) 12/01/2018 0513   PCO2ART 45.0 11/30/2018 0513   PO2ART 94.0 11/09/2018 0513   HCO3 23.0 11/29/2018 0513   TCO2 24 11/22/2018 0513   ACIDBASEDEF 4.0 (H) 11/27/2018 0513   O2SAT 97.0 12/02/2018 0513     Coagulation Profile: No results for input(s): INR, PROTIME in the last 168 hours.  Cardiac Enzymes: No results for input(s): CKTOTAL, CKMB, CKMBINDEX, TROPONINI in the last 168 hours.  HbA1C: Hgb A1c MFr Bld  Date/Time Value Ref Range Status  08/14/2018 10:08 AM 5.7 (H) 4.8 - 5.6 % Final    Comment:    (NOTE) Pre diabetes:          5.7%-6.4% Diabetes:              >6.4% Glycemic control for   <7.0% adults with diabetes   12/06/2017 03:10 PM 6.2 4.6 - 6.5 % Final    Comment:    Glycemic Control Guidelines for People with Diabetes:Non Diabetic:  <6%Goal of Therapy: <7%Additional Action Suggested:  >8%     CBG: No results for input(s): GLUCAP in the last 168 hours.  Review of Systems:   Endorses weakness Denies chest pain Endorses SOB x 1 day, denies cough Denies chills, endorses feeling warm x 1 day  Denies n/v/d Denies dysuria   Past Medical History  He,  has a past medical history of Adenomatous colon polyp, BPH (benign prostatic hyperplasia) (07/14/2013), CAD (coronary artery disease), Carotid stenosis, bilateral, Chronic kidney disease, CRI (chronic renal insufficiency), Diverticulosis, Dysphagia, Esophageal stricture, Glaucoma, Headache(784.0) (12/01/2013), Hyperlipidemia, Hypertension, Insomnia (05/12/2015), Low back pain syndrome (04/03/2014), Medicare  annual wellness visit, subsequent (02/04/2015), Memory loss, Postural lightheadedness, PVD (peripheral vascular disease) ([place]), Renal insufficiency, Sensorineural hearing loss, bilateral, and Unspecified constipation (12/29/2012).   Surgical History    Past Surgical History:  Procedure Laterality Date  . ABDOMINAL AORTIC ANEURYSM REPAIR  1996   Dr [name]  . CORONARY ARTERY BYPASS GRAFT    . EYE SURGERY     cataracts b/l and laser  . HERNIA REPAIR  03/23/05   left inguinal Lichtenstein repair, with mesh.  [name]  . IR [facility] THORACIC WITH BONE BIOPSY  11/08/2017  . IR RADIOLOGIST EVAL & MGMT  10/22/2017  . IR VERTEBROPLASTY CERV/THOR BX INC UNI/BIL INC/INJECT/IMAGING  06/25/2018  . RENAL ARTERY STENT  1996   Dr [name].  Bilateral stenting     Social History   reports that he has quit smoking. He has never  used smokeless tobacco. He reports current alcohol use of about 1.0 standard drinks of alcohol per week. He reports that he does not use drugs.   Family History   His family history includes Coronary artery disease in his father; Hearing loss in his father and paternal grandfather; Heart disease in his brother, father, and son; Stroke in his father. There is no history of Cancer or Diabetes.   Allergies No Known Allergies   Home Medications  Prior to Admission medications   Medication Sig Start Date End Date Taking? Authorizing Provider  acetaminophen (TYLENOL) 500 MG tablet Take 1 tablet by mouth twice daily as needed, okay to increase to 2 tablets by mouth twice daily if needed 11/08/18  Yes [name], MD  amLODipine (NORVASC) 10 MG tablet Take 1 tablet (10 mg total) by mouth daily. 09/18/18  Yes Hennie Duos, MD  aspirin EC 81 MG tablet Take 1 tablet (81 mg total) by mouth at bedtime. 09/18/18  Yes Hennie Duos, MD  atorvastatin (LIPITOR) 40 MG tablet Take 1 tablet (40 mg total) by mouth daily. 09/18/18  Yes Hennie Duos, MD  carvedilol (COREG) 12.5 MG  tablet Take 1 tablet (12.5 mg total) by mouth 2 (two) times daily with a meal. 09/18/18  Yes Hennie Duos, MD  finasteride (PROSCAR) 5 MG tablet Take 1 tablet (5 mg total) by mouth daily. 09/18/18  Yes Hennie Duos, MD  latanoprost (XALATAN) 0.005 % ophthalmic solution Place 1 drop into both eyes at bedtime. 09/18/18  Yes Hennie Duos, MD  losartan (COZAAR) 100 MG tablet Take 1 tablet (100 mg total) by mouth 2 (two) times daily. 09/18/18  Yes Hennie Duos, MD  omega-3 acid ethyl esters (LOVAZA) 1 g capsule Take 2 capsules (2 g total) by mouth 2 (two) times daily. 09/18/18  Yes Hennie Duos, MD  pantoprazole (PROTONIX) 40 MG tablet Take 1 tablet (40 mg total) by mouth every morning. 09/18/18  Yes Hennie Duos, MD  polyethylene glycol [facility] / Floria Raveling) packet Take 17 g by mouth daily. 09/18/18  Yes Hennie Duos, MD  sodium bicarbonate 650 MG tablet Take 1 tablet (650 mg total) by mouth 3 (three) times daily. 1 tablet by mouth with meals 09/18/18  Yes Hennie Duos, MD  feeding supplement, ENSURE ENLIVE, (ENSURE ENLIVE) LIQD Take 237 mLs by mouth 3 (three) times daily between meals. 09/18/18   Hennie Duos, MD  ferrous sulfate 325 (65 FE) MG tablet Take 1 tablet (325 mg total) by mouth 2 (two) times daily with a meal. 09/18/18   Hennie Duos, MD  timolol (TIMOPTIC) 0.5 % ophthalmic solution Place 1 drop into both eyes daily. 09/18/18   Hennie Duos, MD     Critical care time: 80 minutes    Eliseo Gum MSN, AGACNP-BC [place] 11/09/2018, 8:33 AM  Attending Note:  85 year old male with flash pulmonary edema likely related to a cardiac event who presents to PCCM with respiratory failure.  On exam, diffuse crackles.  I reviewed CXR myself, ETT is in a good position and pulmonary edema noted.  Discussed with PCCM-NP.  Will admit to the ICU.  No further escalation of care per discussion with son.  Will not place TLC  or start pressors.  Focus more on comfort.  Active diureses and once ready for extubation will be a one way extubation.  No abx need.  Follow labs.  Adjust vent for  ABG.  Replace electrolytes.  KVO IVF.  D/C propofol and start fentanyl and precedex drips.  PCCM will continue to follow.  Full DNR status entered.  The patient is critically ill with multiple organ systems failure and requires high complexity decision making for assessment and support, frequent evaluation and titration of therapies, application of advanced monitoring technologies and extensive interpretation of multiple databases.   Critical Care Time devoted to patient care services described in this note is  41  Minutes. This time reflects time of care of this signee Dr Jennet Maduro. This critical care time does not reflect procedure time, or teaching time or supervisory time of PA/NP/Med student/Med Resident etc but could involve care discussion time.  Rush Farmer, M.D. [facility] Pulmonary/Critical Care Medicine. Pager: [phone]. After hours pager: [phone].

## 2018-11-19 NOTE — ED Triage Notes (Signed)
Pt BIB [facility] from [facility], initial complaint of shortness of breath, on EMS arrival pt had agonal respirations, and was responsive to voice.

## 2018-11-19 NOTE — ED Notes (Signed)
Pt intubated with 7.5 ETT

## 2018-11-19 NOTE — ED Notes (Signed)
Pt BP 71/44, propofol stopped, ICU MD at bedside speaking with pt's son. Pt appears relaxed.

## 2018-11-19 NOTE — ED Notes (Signed)
This RN in room, son at bedside. Pt observed to be moving both arms, trying to put hands on tube. ICU provider at bedside and gave order for 58mcg fentanyl and will order continuous sedation. Pt has not voided yet. BP is 90s/40s.

## 2018-11-19 NOTE — ED Provider Notes (Signed)
INTUBATION Performed by: Antonietta Breach  Required items: required blood products, implants, devices, and special equipment available Patient identity confirmed: provided demographic data and hospital-assigned identification number Time out: Immediately prior to procedure a "time out" was called to verify the correct patient, procedure, equipment, support staff and site/side marked as required.  Indications: Agonal respirations  Intubation method: Glidescope Laryngoscopy   Preoxygenation: BVM  Sedatives: 20mg  Etomidate Paralytic: 100mg  Succinylcholine  Tube Size: 7.5 cuffed  Post-procedure assessment: chest rise and ETCO2 monitor Breath sounds: equal and absent over the epigastrium Tube secured with: ETT holder Chest x-ray interpreted by radiologist and me.  Chest x-ray findings: endotracheal tube in appropriate position  Patient tolerated the procedure well with no immediate complications.      Antonietta Breach, PA-C 11/10/2018 0456    Orpah Greek, MD 11/28/2018 [phone]

## 2018-11-19 NOTE — Progress Notes (Signed)
Has not voided after 80 mg lasix. Bladder scan minimal @ 180. BP stable, currently 120/70's. New orders for 5mg  metolazone and additional 120 mg lasix. Monitor

## 2018-11-19 NOTE — Progress Notes (Signed)
Pt transported from ED [facility] to [place] on vent. Pt stable throughout with no complications. VS within normal limits.

## 2018-11-20 ENCOUNTER — Inpatient Hospital Stay (HOSPITAL_COMMUNITY): Payer: Medicare HMO

## 2018-11-20 ENCOUNTER — Telehealth: Payer: Self-pay | Admitting: *Deleted

## 2018-11-20 DIAGNOSIS — N179 Acute kidney failure, unspecified: Secondary | ICD-10-CM

## 2018-11-20 LAB — BASIC METABOLIC PANEL
Anion gap: 18 — ABNORMAL HIGH (ref 5–15)
BUN: 51 mg/dL — ABNORMAL HIGH (ref 8–23)
CALCIUM: 8.8 mg/dL — AB (ref 8.9–10.3)
CO2: 18 mmol/L — ABNORMAL LOW (ref 22–32)
Chloride: 103 mmol/L (ref 98–111)
Creatinine, Ser: 2.07 mg/dL — ABNORMAL HIGH (ref 0.61–1.24)
GFR calc Af Amer: 32 mL/min — ABNORMAL LOW (ref >60)
GFR calc non Af Amer: 28 mL/min — ABNORMAL LOW (ref >60)
Glucose, Bld: 102 mg/dL — ABNORMAL HIGH (ref 70–99)
Potassium: 3.9 mmol/L (ref 3.5–5.1)
Sodium: 139 mmol/L (ref 135–145)

## 2018-11-20 LAB — CBC
HCT: 30.9 % — ABNORMAL LOW (ref 39.0–52.0)
Hemoglobin: 9.7 g/dL — ABNORMAL LOW (ref 13.0–17.0)
MCH: 28 pg (ref 26.0–34.0)
MCHC: 31.4 g/dL (ref 30.0–36.0)
MCV: 89.3 fL (ref 80.0–100.0)
Platelets: 243 10*3/uL (ref 150–400)
RBC: 3.46 MIL/uL — ABNORMAL LOW (ref 4.22–5.81)
RDW: 14.6 % (ref 11.5–15.5)
WBC: 14.1 10*3/uL — ABNORMAL HIGH (ref 4.0–10.5)
nRBC: 0 % (ref 0.0–0.2)

## 2018-11-20 LAB — TROPONIN I: Troponin I: 0.68 ng/mL (ref <0.03)

## 2018-11-20 LAB — MAGNESIUM: Magnesium: 1.8 mg/dL (ref 1.7–2.4)

## 2018-11-20 LAB — PHOSPHORUS: Phosphorus: 4.1 mg/dL (ref 2.5–4.6)

## 2018-11-20 MED ORDER — FUROSEMIDE 10 MG/ML IJ SOLN
40.0000 mg | Freq: Three times a day (TID) | INTRAMUSCULAR | Status: AC
  Administered 2018-11-20 (×2): 40 mg via INTRAVENOUS
  Filled 2018-11-20 (×2): qty 4

## 2018-11-20 MED ORDER — HYDRALAZINE HCL 20 MG/ML IJ SOLN
INTRAMUSCULAR | Status: AC
  Filled 2018-11-20: qty 1

## 2018-11-20 MED ORDER — HYDRALAZINE HCL 20 MG/ML IJ SOLN
10.0000 mg | INTRAMUSCULAR | Status: DC | PRN
  Administered 2018-11-20: 10 mg via INTRAVENOUS

## 2018-11-20 NOTE — Plan of Care (Signed)
PMT note:  Family meeting at 12:00 tomorrow with wife and son.

## 2018-11-20 NOTE — Telephone Encounter (Signed)
Received Physician Orders/F2F from [facility]; forwarded to provider/SLS

## 2018-11-20 NOTE — Progress Notes (Signed)
Elink MD notified of pt in new onset AFIB with a controlled rate. In and out and pt due for 2200 coreg dose. No new orders received. Will continue to monitor closely. Eleonore Chiquito RN 2 Heart CV[facility]

## 2018-11-20 NOTE — Progress Notes (Signed)
PT Cancellation Note  Patient Details Name: DARVELL MONTEFORTE MRN: [id number] DOB: 05-Jul-1928   Cancelled Treatment:    Reason Eval/Treat Not Completed: Medical issues which prohibited therapy(Pt getting extubated this am per nurse.  Asked to HOLD today)   Denice Paradise 11/20/2018, 11:19 AM Amanda Cockayne Acute Rehabilitation Services Pager:  [phone]  Office:  [phone]

## 2018-11-20 NOTE — Procedures (Signed)
Extubation Procedure Note  Patient Details:   Name: Troy Ray DOB: 1928-02-23 MRN: [id number]   Airway Documentation:    Vent end date: 11/20/18 Vent end time: 1140   Evaluation  O2 sats: stable throughout Complications: No apparent complications Patient did tolerate procedure well. Bilateral Breath Sounds: Clear, Diminished   Yes   Patient extubated to a 4L [place]. Cuff leak was heard. Stridor was noted. RN at the bedside with RT during extubation. Family is at bedside with patient at this time.  Renato Gails [facility] 11/20/2018, 11:42 AM

## 2018-11-20 NOTE — Progress Notes (Addendum)
NAME:  Troy Ray, MRN:  [id number], DOB:  05-Jan-1928, LOS: 1 ADMISSION DATE:  11/22/2018, CONSULTATION DATE:  1/14 REFERRING MD:  EDP, CHIEF COMPLAINT:  SOB  Brief History   85 yo M presented from his ALF via EMS for shortness of breath. Found to have pulmonary edema with BNP >2000 requiring intubation.   Past Medical History  CVA CKD CAD HTN HLD BPH Dysphagia  Significant Hospital Events   1/14 Admit >>  Procedures:  1/14 Intubated  >>   Significant Diagnostic Tests:  1/14 CXR >> bilateral lung opacities, large hiatal hernia.   Micro Data:   1/14 BCx >> pending  1/14 Sputum Cx >> gram stain polymicrobial, culture pending   Ucx not collected   Antimicrobials:    Interim history/subjective:  Sedated on vent, appears comfortable.   Objective   Blood pressure (!) 161/56, pulse (!) 55, temperature 97.8 F (36.6 C), temperature source Axillary, resp. rate 18, height 6' (1.829 m), weight 61.4 kg, SpO2 100 %.    Vent Mode: PRVC FiO2 (%):  [30 %-100 %] 30 % Set Rate:  [18 bmp] 18 bmp Vt Set:  [620 mL] 620 mL PEEP:  [8 cmH20] 8 cmH20 Plateau Pressure:  [18 cmH20-40 cmH20] 18 cmH20   Intake/Output Summary (Last 24 hours) at 11/20/2018 0655 Last data filed at 11/20/2018 0600 Gross per 24 hour  Intake 281.38 ml  Output 1685 ml  Net -1403.62 ml   Filed Weights   11/11/2018 0735 11/14/2018 0745 11/20/18 0500  Weight: 68 kg 77.1 kg 61.4 kg    Examination: General: Laying in bed, sedated on vent HENT: ETT in place, NCAT Lungs: CTAB, no wheezing, rales, or rhonchi  Cardiovascular: Bradycardic but regular, no m/r/g Abdomen: Soft, non distended, hypoactive bowel sounds Extremities: Warm, no edema Neuro: Sedated  Resolved Hospital Problem list     Assessment & Plan:   Acute Hypoxic Respiratory Failure: Likely flash pulmonary edema secondary to a cardiac event. S/p total of 240 mg IV lasix yesterday over multiple doses, net negative 1.4L today. CXR improved this  morning.  -- Full vent support, will plan for extubation today  -- I&Os, daily weights -- Hold further diuresis for now  NSTEMI: Trop up to 1.2 last night, EKG with minimal ST depression in anterolateral leads. Has history of CAD s/p CABG in 2000. Last echo in October of 2019 with normal systolic function EF 41-93%, inadequate to assess wall motion or diastolic function.  -- Trend troponin to peak  -- Medical management, goals of care per son are focused more comfort. No invasive procedures or escalation of care.  -- No further work up at this time  AKI: Scr up 2.0 this morning. Baseline renal function is normal.  -- Daily BMPs -- I&Os  -- Hold further lasix   Hx HTN: -- Continue coreg -- Holding norvasc, losartan   BPH: -- Foley catheter  -- Continue home finasteride   Hx CVA: with residual dysphagia  -- NPO, SLP once extubated   Best practice:  Diet: NPO  Pain/Anxiety/Delirium protocol (if indicated): fentanyl, propofol  VAP protocol (if indicated): yes DVT prophylaxis: lovenox GI prophylaxis: protonix  Glucose control: monitor Mobility: bedrest Code Status: DNR Family Communication: None at bedside Disposition: Remain in ICU, possible transfer to floor pending extubation   Troy Ray, M.D. - PGY3 Pager: [phone] 11/20/2018, 7:16 AM   Attending Note:  85 year old male with extensive PMH who presents to PCCM with acute pulmonary edema and VDRF  that was intubated.  This AM, patient is much more alert and interactive, moving all ext to command and weaning well.  I reviewed CXR myself, pulmonary edema noted but improving.  Discussed with PCCM-resident.  Will decrease diureses for now given renal function.  Monitor troponin but no need for card consult for now.  Spoke with family, will proceed with one way extubation today, patient would have never wanted to be intubated but was intubated in a panic.  Full DNR.  If decompensates then will consider comfort measures.   Will call palliative care to arrange for hospice outside the hospital and assist with symptom management.  The patient is critically ill with multiple organ systems failure and requires high complexity decision making for assessment and support, frequent evaluation and titration of therapies, application of advanced monitoring technologies and extensive interpretation of multiple databases.   Critical Care Time devoted to patient care services described in this note is  34  Minutes. This time reflects time of care of this signee Dr Troy Ray. This critical care time does not reflect procedure time, or teaching time or supervisory time of PA/NP/Med student/Med Resident etc but could involve care discussion time.  Troy Ray, M.D. [facility] Pulmonary/Critical Care Medicine. Pager: [phone]. After hours pager: [phone].

## 2018-11-21 LAB — CBC
HCT: 31.9 % — ABNORMAL LOW (ref 39.0–52.0)
Hemoglobin: 10.2 g/dL — ABNORMAL LOW (ref 13.0–17.0)
MCH: 28.7 pg (ref 26.0–34.0)
MCHC: 32 g/dL (ref 30.0–36.0)
MCV: 89.6 fL (ref 80.0–100.0)
Platelets: 278 10*3/uL (ref 150–400)
RBC: 3.56 MIL/uL — ABNORMAL LOW (ref 4.22–5.81)
RDW: 14.6 % (ref 11.5–15.5)
WBC: 18.7 10*3/uL — AB (ref 4.0–10.5)
nRBC: 0 % (ref 0.0–0.2)

## 2018-11-21 LAB — BASIC METABOLIC PANEL
Anion gap: 19 — ABNORMAL HIGH (ref 5–15)
BUN: 61 mg/dL — ABNORMAL HIGH (ref 8–23)
CO2: 17 mmol/L — ABNORMAL LOW (ref 22–32)
Calcium: 8.7 mg/dL — ABNORMAL LOW (ref 8.9–10.3)
Chloride: 103 mmol/L (ref 98–111)
Creatinine, Ser: 2.1 mg/dL — ABNORMAL HIGH (ref 0.61–1.24)
GFR calc Af Amer: 32 mL/min — ABNORMAL LOW (ref >60)
GFR calc non Af Amer: 27 mL/min — ABNORMAL LOW (ref >60)
Glucose, Bld: 93 mg/dL (ref 70–99)
POTASSIUM: 4.1 mmol/L (ref 3.5–5.1)
Sodium: 139 mmol/L (ref 135–145)

## 2018-11-21 LAB — CULTURE, RESPIRATORY W GRAM STAIN
Culture: NORMAL
Special Requests: NORMAL

## 2018-11-21 LAB — MAGNESIUM: Magnesium: 2 mg/dL (ref 1.7–2.4)

## 2018-11-21 LAB — PHOSPHORUS: PHOSPHORUS: 6.1 mg/dL — AB (ref 2.5–4.6)

## 2018-11-21 MED ORDER — FINASTERIDE 5 MG PO TABS
5.0000 mg | ORAL_TABLET | Freq: Every day | ORAL | Status: DC
  Administered 2018-11-22 – 2018-11-23 (×2): 5 mg via ORAL
  Filled 2018-11-21 (×2): qty 1

## 2018-11-21 NOTE — Evaluation (Signed)
Physical Therapy Evaluation Patient Details Name: Troy Ray MRN: [id number] DOB: 1928/05/01 Today's Date: 11/21/2018   History of Present Illness  85 y.o. M present from ALF via EMS with SOB, found to have pulmonary edema and hypoxic respiratory failure suspect secondary to NSTEMI. PMH includes but not limited to: CVA, CAD, CKD, HTN, HLD, PVD,  CABG    Clinical Impression  Pt admitted with above diagnosis. Pt currently with functional limitations due to the deficits listed below (see PT Problem List). PTA, pt living at ALF per chart. Today disoriented to person place situation and time. Unable to follow cues, stating "I dont want to be here" and blaming his wife "for this whole mess". Unable to obtain full assessment of mobility, will follow to progress mobility given baseline. Pt will benefit from skilled PT to increase their independence and safety with mobility to allow discharge to the venue listed below.       Follow Up Recommendations SNF    Equipment Recommendations       Recommendations for Other Services OT consult     Precautions / Restrictions Precautions Precautions: Fall Restrictions Weight Bearing Restrictions: No      Mobility  Bed Mobility Overal bed mobility: Needs Assistance Bed Mobility: Supine to Sit           General bed mobility comments: Attempted to encourage patient to sit, patient becoming combative stating he wants no part in anything. Confused. blaming his wife for physical therapy.   Transfers                    Ambulation/Gait                Stairs            Wheelchair Mobility    Modified Rankin (Stroke Patients Only)       Balance                                             Pertinent Vitals/Pain Pain Assessment: No/denies pain    Home Living Family/patient expects to be discharged to:: Assisted living                      Prior Function           Comments: Patient  confused unable to provide history, per chart lives at ALF and ambulates with RW     Hand Dominance        Extremity/Trunk Assessment   Upper Extremity Assessment Upper Extremity Assessment: Difficult to assess due to impaired cognition    Lower Extremity Assessment Lower Extremity Assessment: Difficult to assess due to impaired cognition       Communication   Communication: HOH  Cognition Arousal/Alertness: Awake/alert Behavior During Therapy: WFL for tasks assessed/performed Overall Cognitive Status: Within Functional Limits for tasks assessed                                        General Comments      Exercises     Assessment/Plan    PT Assessment Patient needs continued PT services  PT Problem List Decreased strength       PT Treatment Interventions DME instruction;Gait training;Functional mobility training;Therapeutic activities;Therapeutic exercise    PT Goals (  Current goals can be found in the Care Plan section)  Acute Rehab PT Goals Patient Stated Goal: non stated PT Goal Formulation: With patient Time For Goal Achievement: 12/05/18 Potential to Achieve Goals: Fair    Frequency Min 1X/week   Barriers to discharge        Co-evaluation               AM-PAC PT "6 Clicks" Mobility  Outcome Measure Help needed turning from your back to your side while in a flat bed without using bedrails?: Total Help needed moving from lying on your back to sitting on the side of a flat bed without using bedrails?: Total Help needed moving to and from a bed to a chair (including a wheelchair)?: Total Help needed standing up from a chair using your arms (e.g., wheelchair or bedside chair)?: Total Help needed to walk in hospital room?: Total Help needed climbing 3-5 steps with a railing? : Total 6 Click Score: 6    End of Session   Activity Tolerance: Other (comment)(limited by confusion.)     PT Visit Diagnosis: Muscle weakness  (generalized) (M62.81)    Time: 1715-1730 PT Time Calculation (min) (ACUTE ONLY): 15 min   Charges:   PT Evaluation $PT Eval Moderate Complexity: 1 Mod          Reinaldo Berber, PT, DPT Acute Rehabilitation Services Pager: [phone] Office: [phone]    Reinaldo Berber 11/21/2018, 5:32 PM

## 2018-11-21 NOTE — Consult Note (Addendum)
Consultation Note Date: 11/21/2018   Patient Name: Troy Ray  DOB: 06-Jul-1928  MRN: [id number]  Age / Sex: 85 y.o., male  PCP: [name], MD Referring Physician: Kayleen Memos, DO  Reason for Consultation: Establishing goals of care  HPI/Patient Profile: 85 yo M presented from his ALF via EMS for shortness of breath. Found to have pulmonary edema and requiring intubation.   Clinical Assessment and Goals of Care: Patient is resting in bed. He is disoriented. Wife, son, and daughter in law are at bedside. He had a stroke in October 2019. He was able to walk with a walker, but soon was unable to walk. He has been living at an assisted living facility. Per family, he was alert and oriented prior to this hospitalization.    We discussed his diagnosis, prognosis, GOC, EOL wishes disposition and options.  A detailed discussion was had today regarding advanced directives.  Concepts specific to code status, artifical feeding and hydration, IV antibiotics and rehospitalization were discussed.  The difference between an aggressive medical intervention path and a comfort care path was discussed.  Values and goals of care important to patient and family were attempted to be elicited.  Discussed limitations of medical interventions to prolong quality of life for this patient at this time in this situation and discussed the concept of human mortality.  The family states he would never want CPR, and when asked yesterday if he would want to be reintubated, the family states he said "no". They do not feel he would ever want a feeding tube. He is currently on a dysphagia diet as of late this morning. The family states she is drinking water, but has only ate a small amount of apple sauce. If he does not tolerate the diet, they will allow him to have what he wants.   They would like for him to be medically optimized as  they want him to be tucked in for what time he has left at the receiving facility. They would like to treat what is treatable with no escalation in care. They are clear they want hospice care wherever he goes after discharge.  They are amenable to hospice home if he is a candidate, but I am unsure today if his prognosis is such that he would qualify for hospice home. Will see how he does overnight and reevaluate tomorrow.     Wife is surrogate Media planner.     SUMMARY OF RECOMMENDATIONS    D/C with hospice. Hospice home vs facility with hospice.   Code Status/Advance Care Planning:  DNR  Prognosis:   < 6 months  Discharge Planning: To Be Determined      Primary Diagnoses: Present on Admission: . Respiratory failure ([place])   I have reviewed the medical record, interviewed the patient and family, and examined the patient. The following aspects are pertinent.  Past Medical History:  Diagnosis Date  . Adenomatous colon polyp   . BPH (benign prostatic hyperplasia) 07/14/2013  . CAD (coronary artery disease)   .  Carotid stenosis, bilateral    right 60-79% stenosed.  Left 40-59%  . Chronic kidney disease    chronic, stage II  . CRI (chronic renal insufficiency)   . Diverticulosis   . Dysphagia   . Esophageal stricture    Dr [name]  . Glaucoma   . Headache(784.0) 12/01/2013  . Hyperlipidemia   . Hypertension   . Insomnia 05/12/2015  . Low back pain syndrome 04/03/2014  . Medicare annual wellness visit, subsequent 02/04/2015  . Memory loss   . Postural lightheadedness   . PVD (peripheral vascular disease) ([place])   . Renal insufficiency   . Sensorineural hearing loss, bilateral   . Unspecified constipation 12/29/2012   Social History   Socioeconomic History  . Marital status: Married    Spouse name: Not on file  . Number of children: 3  . Years of education: Not on file  . Highest education level: Not on file  Occupational History  . Occupation: MEDIA ASSISTANT     Employer: [employer]  Social Needs  . Financial resource strain: Not on file  . Food insecurity:    Worry: Not on file    Inability: Not on file  . Transportation needs:    Medical: Not on file    Non-medical: Not on file  Tobacco Use  . Smoking status: Former Research scientist (life sciences)  . Smokeless tobacco: Never Used  . Tobacco comment: quit 1980  Substance and Sexual Activity  . Alcohol use: Yes    Alcohol/week: 1.0 standard drinks    Types: 1 Glasses of wine per week  . Drug use: No  . Sexual activity: Yes    Comment: lives with wife, retired, no dietary restriction  Lifestyle  . Physical activity:    Days per week: Not on file    Minutes per session: Not on file  . Stress: Not on file  Relationships  . Social connections:    Talks on phone: Not on file    Gets together: Not on file    Attends religious service: Not on file    Active member of club or organization: Not on file    Attends meetings of clubs or organizations: Not on file    Relationship status: Not on file  Other Topics Concern  . Not on file  Social History Narrative  . Not on file   Family History  Problem Relation Age of Onset  . Heart disease Father        CAD  . Coronary artery disease Father   . Stroke Father   . Hearing loss Father   . Hearing loss Paternal Grandfather   . Heart disease Son   . Heart disease Brother        MI  . Cancer Neg Hx   . Diabetes Neg Hx    Scheduled Meds: . carvedilol  12.5 mg Oral BID WC  . enoxaparin (LOVENOX) injection  30 mg Subcutaneous Q24H  . finasteride  5 mg Oral Daily  . mouth rinse  15 mL Mouth Rinse 10 times per day  . pantoprazole (PROTONIX) IV  40 mg Intravenous QHS   Continuous Infusions: . fentaNYL infusion INTRAVENOUS Stopped (12/04/2018 0938)   PRN Meds:.albuterol, fentaNYL (SUBLIMAZE) injection, fentaNYL (SUBLIMAZE) injection, hydrALAZINE Medications Prior to Admission:  Prior to Admission medications   Medication Sig Start Date End Date Taking?  Authorizing Provider  acetaminophen (TYLENOL) 500 MG tablet Take 1 tablet by mouth twice daily as needed, okay to increase to 2 tablets by  mouth twice daily if needed Patient taking differently: Take 500-1,000 mg by mouth 2 (two) times daily as needed for mild pain or moderate pain.  11/08/18  Yes [name], MD  amLODipine (NORVASC) 10 MG tablet Take 1 tablet (10 mg total) by mouth daily. 09/18/18  Yes Hennie Duos, MD  aspirin EC 81 MG tablet Take 1 tablet (81 mg total) by mouth at bedtime. 09/18/18  Yes Hennie Duos, MD  atorvastatin (LIPITOR) 40 MG tablet Take 1 tablet (40 mg total) by mouth daily. 09/18/18  Yes Hennie Duos, MD  carvedilol (COREG) 12.5 MG tablet Take 1 tablet (12.5 mg total) by mouth 2 (two) times daily with a meal. 09/18/18  Yes Hennie Duos, MD  feeding supplement, ENSURE ENLIVE, (ENSURE ENLIVE) LIQD Take 237 mLs by mouth 3 (three) times daily between meals. 09/18/18  Yes Hennie Duos, MD  ferrous sulfate 325 (65 FE) MG tablet Take 1 tablet (325 mg total) by mouth 2 (two) times daily with a meal. 09/18/18  Yes Hennie Duos, MD  finasteride (PROSCAR) 5 MG tablet Take 1 tablet (5 mg total) by mouth daily. 09/18/18  Yes Hennie Duos, MD  latanoprost (XALATAN) 0.005 % ophthalmic solution Place 1 drop into both eyes at bedtime. 09/18/18  Yes Hennie Duos, MD  losartan (COZAAR) 100 MG tablet Take 1 tablet (100 mg total) by mouth 2 (two) times daily. 09/18/18  Yes Hennie Duos, MD  Neomy-Bacit-Polymyx-Pramoxine (TRIPLE ANTIBIOTIC PLUS) 1 % OINT Apply 1 application topically every 8 (eight) hours as needed (tenderness).   Yes [provider]  omega-3 acid ethyl esters (LOVAZA) 1 g capsule Take 2 capsules (2 g total) by mouth 2 (two) times daily. 09/18/18  Yes Hennie Duos, MD  pantoprazole (PROTONIX) 40 MG tablet Take 1 tablet (40 mg total) by mouth every morning. 09/18/18  Yes Hennie Duos, MD  polyethylene glycol  [facility]) packet Take 17 g by mouth daily. 09/18/18  Yes Hennie Duos, MD  sodium bicarbonate 650 MG tablet Take 1 tablet (650 mg total) by mouth 3 (three) times daily. 1 tablet by mouth with meals 09/18/18  Yes Hennie Duos, MD  timolol (TIMOPTIC) 0.5 % ophthalmic solution Place 1 drop into both eyes daily. 09/18/18  Yes Hennie Duos, MD   No Known Allergies Review of Systems  Unable to perform ROS   Physical Exam Pulmonary:     Effort: Pulmonary effort is normal.  Skin:    General: Skin is warm and dry.  Neurological:     Mental Status: He is disoriented.     Vital Signs: BP 117/80   Pulse 73   Temp 98.3 F (36.8 C) (Oral)   Resp 20   Ht 6' (1.829 m)   Wt 61.4 kg   SpO2 99%   BMI 18.36 kg/m  Pain Scale: PAINAD POSS *See Group Information*: 1-Acceptable,Awake and alert Pain Score: 0-No pain   SpO2: SpO2: 99 % O2 Device:SpO2: 99 % O2 Flow Rate: .O2 Flow Rate (L/min): 1 L/min  IO: Intake/output summary:   Intake/Output Summary (Last 24 hours) at 11/21/2018 1318 Last data filed at 11/21/2018 0600 Gross per 24 hour  Intake 2.98 ml  Output 645 ml  Net -642.02 ml    LBM: Last BM Date: (UTA) Baseline Weight: Weight: 68 kg Most recent weight: Weight: 61.4 kg     Palliative Assessment/Data:   Flowsheet Rows     Most Recent  Value  Intake Tab  Referral Department  Critical care  Unit at Time of Referral  ICU  Palliative Care Primary Diagnosis  Cardiac  Date Notified  11/20/18  Palliative Care Type  New Palliative care  Reason for referral  Clarify Goals of Care, Counsel Regarding Hospice  Date of Admission  11/11/2018  # of days IP prior to Palliative referral  1  Clinical Assessment  Psychosocial & Spiritual Assessment  Palliative Care Outcomes      Time In: 12:00 Time Out: 1:10 Time Total: 70 min Greater than 50%  of this time was spent counseling and coordinating care related to the above assessment and plan.  Signed  by: Asencion Gowda, NP   Please contact Palliative Medicine Team phone at [phone] for questions and concerns.  For individual provider: See Shea Evans

## 2018-11-21 NOTE — Progress Notes (Addendum)
PROGRESS NOTE  CASTON COOPERSMITH YIF:[id number] DOB: 10-19-1928 DOA: 11/07/2018 PCP: [name], MD  HPI/Recap of past 24 hours: 85 yo M presented from his ALF via EMS for shortness of breath. Found to have pulmonary edema with BNP >2000 requiring intubation.   Extubated on 11/20/2018.  On 2 L nasal cannula and maintaining O2 saturation greater than 92%.  Patient is full DNR.  If he decompensated family will consider comfort measures.  Palliative care has been consulted and will meet with family today 11/21/2018.  TRH assumed care on 11/21/2018.  11/21/2018: Patient seen and examined at his bedside.  He is alert and minimally interactive.  In no distress.  Denies chest pain or dyspnea.  He has no new complaint.  Assessment/Plan: Active Problems:   Respiratory failure (HCC)  Acute hypoxic respiratory failure suspect secondary to flash pulmonary edema from NSTEMI  Presented with flash pulmonary edema requiring mechanical ventilation and extubated on 11/20/2018  Independently reviewed chest x-ray done on 11/20/2018 which revealed much improved aeration from bilateral pleural edema seen on prior chest x-ray. Saturating well on 2 L nasal cannula.  May be able to wean off.  NSTEMI Troponin peaked at 1.23 and trended down. History of coronary artery disease status post CABG in 2000.  Has history of CAD s/p CABG in 2000.  Last echo in October of 2019 with normal systolic function EF 86-76%, inadequate to assess wall motion or diastolic function.  Denies chest pain this morning No further work-up Family will meet with palliative care team today  AKI:  Baseline creatinine normal Presented with AKI Creatinine is trending up from 2.07-2.10 Avoid nephrotoxic agents/dehydration/hypotension Diuretics have been held  Repeat BMP in the morning Monitor urine output.  Net -1.4 L since admission  Dysphagia N.p.o. until passes swallow evaluation by speech therapy Give p.o. meds if passes swallow  eval Aspiration precautions  HTN: -Blood pressure stable -- Continue coreg -- Holding norvasc, losartan  and Lasix due to soft blood pressures and AKI  BPH with urinary retention: -- Foley catheter in place -- Continue home finasteride   Hx CVA: with residual dysphagia  -- NPO, SLP once extubated   Best practice:  Diet:NPO Pain/Anxiety/Delirium protocol (if indicated):fentanyl, propofol VAP protocol (if indicated):yes DVT prophylaxis:lovenox GI prophylaxis:protonix Glucose control:monitor Mobility:bedrest Code Status:DNR Family Communication:None at bedside Disposition:Remain in ICU, possible transfer to floor pending extubation      Objective: Vitals:   11/20/18 1800 11/20/18 1834 11/20/18 2234 11/21/18 0832  BP: (!) 139/50 (!) 154/53 131/78 117/80  Pulse: 74 77 (!) 113 73  Resp: (!) 22 17 20    Temp:  98.8 F (37.1 C) 98.8 F (37.1 C) 98.3 F (36.8 C)  TempSrc:    Oral  SpO2: 94% 92% 96% 99%  Weight:      Height:        Intake/Output Summary (Last 24 hours) at 11/21/2018 1010 Last data filed at 11/21/2018 0600 Gross per 24 hour  Intake 2.98 ml  Output 855 ml  Net -852.02 ml   Filed Weights   11/25/2018 0735 12/02/2018 0745 11/20/18 0500  Weight: 68 kg 77.1 kg 61.4 kg    Exam:  . General: 85 y.o. year-old male well developed well nourished in no acute distress.  Alert and minimally interactive. . Cardiovascular: Regular rate and rhythm with no rubs or gallops.  No thyromegaly or JVD noted.   [name] Kitchen Respiratory: Clear to auscultation with no wheezes or rales. Good inspiratory effort. . Abdomen: Soft nontender  nondistended with normal bowel sounds x4 quadrants. . Musculoskeletal: No lower extremity edema. 2/4 pulses in all 4 extremities. [name] Kitchen Psychiatry: Mood is appropriate for condition and setting   Data Reviewed: CBC: Recent Labs  Lab 11/13/2018 0444 11/20/18 0242 11/21/18 0235  WBC 18.3* 14.1* 18.7*  NEUTROABS 13.2*  --   --   HGB 10.9*  9.7* 10.2*  HCT 36.0* 30.9* 31.9*  MCV 95.7 89.3 89.6  PLT 316 243 161   Basic Metabolic Panel: Recent Labs  Lab 12/03/2018 0444 11/28/2018 0955 11/06/2018 1322 11/20/18 0242 11/21/18 0235  NA 133*  --  138 139 139  K 4.6  --  5.0 3.9 4.1  CL 103  --  105 103 103  CO2 17*  --  21* 18* 17*  GLUCOSE 229*  --  122* 102* 93  BUN 43*  --  48* 51* 61*  CREATININE 1.80*  --  1.88* 2.07* 2.10*  CALCIUM 8.6*  --  8.6* 8.8* 8.7*  MG  --  1.8  --  1.8 2.0  PHOS  --  3.7  --  4.1 6.1*   GFR: Estimated Creatinine Clearance: 21.1 mL/min (A) (by C-G formula based on SCr of 2.1 mg/dL (H)). Liver Function Tests: Recent Labs  Lab 11/26/2018 0444  AST 17  ALT 7  ALKPHOS 48  BILITOT 0.7  PROT 6.2*  ALBUMIN 2.6*   No results for input(s): LIPASE, AMYLASE in the last 168 hours. No results for input(s): AMMONIA in the last 168 hours. Coagulation Profile: No results for input(s): INR, PROTIME in the last 168 hours. Cardiac Enzymes: Recent Labs  Lab 11/07/2018 0955 12/01/2018 1322 11/20/2018 2028 11/20/18 0716  TROPONINI 0.62* 1.08* 1.23* 0.68*   BNP (last 3 results) No results for input(s): PROBNP in the last 8760 hours. HbA1C: No results for input(s): HGBA1C in the last 72 hours. CBG: No results for input(s): GLUCAP in the last 168 hours. Lipid Profile: Recent Labs    11/09/2018 0444  TRIG 56   Thyroid Function Tests: No results for input(s): TSH, T4TOTAL, FREET4, T3FREE, THYROIDAB in the last 72 hours. Anemia Panel: No results for input(s): VITAMINB12, FOLATE, FERRITIN, TIBC, IRON, RETICCTPCT in the last 72 hours. Urine analysis:    Component Value Date/Time   COLORURINE YELLOW 08/14/2018 1904   APPEARANCEUR CLEAR 08/14/2018 1904   LABSPEC 1.029 08/14/2018 1904   PHURINE 6.0 08/14/2018 1904   GLUCOSEU NEGATIVE 08/14/2018 1904   HGBUR NEGATIVE 08/14/2018 1904   HGBUR small 07/04/2010 1559   BILIRUBINUR NEGATIVE 08/14/2018 1904   KETONESUR 5 (A) 08/14/2018 1904   PROTEINUR  NEGATIVE 08/14/2018 1904   UROBILINOGEN 0.2 08/20/2014 1056   NITRITE NEGATIVE 08/14/2018 1904   LEUKOCYTESUR NEGATIVE 08/14/2018 1904   Sepsis Labs: @LABRCNTIP (procalcitonin:4,lacticidven:4)  ) Recent Results (from the past 240 hour(s))  MRSA PCR Screening     Status: None   Collection Time: 11/11/2018  8:56 AM  Result Value Ref Range Status   MRSA by PCR NEGATIVE NEGATIVE Final    Comment:        The GeneXpert MRSA Assay (FDA approved for NASAL specimens only), is one component of a comprehensive MRSA colonization surveillance program. It is not intended to diagnose MRSA infection nor to guide or monitor treatment for MRSA infections. Performed at [facility], [place] [address], [place], [place] [address]   Culture, blood (Routine X 2) w Reflex to ID Panel     Status: None (Preliminary result)   Collection Time: 12/03/2018  9:55 AM  Result Value Ref Range Status   Specimen Description BLOOD LEFT ANTECUBITAL  Final   Special Requests   Final    BOTTLES DRAWN AEROBIC AND ANAEROBIC Blood Culture adequate volume   Culture   Final    NO GROWTH 2 DAYS Performed at [facility], [address], [place], [place] [address]    Report Status PENDING  Incomplete  Culture, blood (Routine X 2) w Reflex to ID Panel     Status: None (Preliminary result)   Collection Time: 11/11/2018 10:00 AM  Result Value Ref Range Status   Specimen Description BLOOD RIGHT HAND  Final   Special Requests   Final    BOTTLES DRAWN AEROBIC ONLY Blood Culture adequate volume   Culture   Final    NO GROWTH 2 DAYS Performed at [facility], [place] [address] [place], [place] [address]    Report Status PENDING  Incomplete  Culture, respiratory (non-expectorated)     Status: None   Collection Time: 11/22/2018 10:11 AM  Result Value Ref Range Status   Specimen Description TRACHEAL ASPIRATE  Final   Special Requests Normal  Final   Gram Stain   Final    FEW WBC PRESENT,BOTH PMN AND  MONONUCLEAR MODERATE GRAM POSITIVE COCCI IN PAIRS IN CHAINS MODERATE GRAM VARIABLE ROD    Culture   Final    FEW Consistent with normal respiratory flora. Performed at [facility], [place] [address], [place], [place] [address]    Report Status 11/21/2018 FINAL  Final      Studies: No results found.  Scheduled Meds: . carvedilol  12.5 mg Oral BID WC  . enoxaparin (LOVENOX) injection  30 mg Subcutaneous Q24H  . mouth rinse  15 mL Mouth Rinse 10 times per day  . pantoprazole (PROTONIX) IV  40 mg Intravenous QHS    Continuous Infusions: . fentaNYL infusion INTRAVENOUS Stopped (12/02/2018 0092)     LOS: 2 days     Kayleen Memos, MD [facility] Pager [phone]  If 7PM-7AM, please contact night-coverage [email or web address] Password [email or web address] 11/21/2018, 10:10 AM

## 2018-11-21 NOTE — Evaluation (Signed)
Clinical/Bedside Swallow Evaluation Patient Details  Name: Troy Ray MRN: [id number] Date of Birth: July 30, 1928  Today's Date: 11/21/2018 Time: SLP Start Time (ACUTE ONLY): 1045 SLP Stop Time (ACUTE ONLY): 1115 SLP Time Calculation (min) (ACUTE ONLY): 30 min  Past Medical History:  Past Medical History:  Diagnosis Date  . Adenomatous colon polyp   . BPH (benign prostatic hyperplasia) 07/14/2013  . CAD (coronary artery disease)   . Carotid stenosis, bilateral    right 60-79% stenosed.  Left 40-59%  . Chronic kidney disease    chronic, stage II  . CRI (chronic renal insufficiency)   . Diverticulosis   . Dysphagia   . Esophageal stricture    Dr [name]  . Glaucoma   . Headache(784.0) 12/01/2013  . Hyperlipidemia   . Hypertension   . Insomnia 05/12/2015  . Low back pain syndrome 04/03/2014  . Medicare annual wellness visit, subsequent 02/04/2015  . Memory loss   . Postural lightheadedness   . PVD (peripheral vascular disease) ([place])   . Renal insufficiency   . Sensorineural hearing loss, bilateral   . Unspecified constipation 12/29/2012   Past Surgical History:  Past Surgical History:  Procedure Laterality Date  . ABDOMINAL AORTIC ANEURYSM REPAIR  1996   Dr [name]  . CORONARY ARTERY BYPASS GRAFT    . EYE SURGERY     cataracts b/l and laser  . HERNIA REPAIR  03/23/05   left inguinal Lichtenstein repair, with mesh.  [name]  . IR [facility] THORACIC WITH BONE BIOPSY  11/08/2017  . IR RADIOLOGIST EVAL & MGMT  10/22/2017  . IR VERTEBROPLASTY CERV/THOR BX INC UNI/BIL INC/INJECT/IMAGING  06/25/2018  . RENAL ARTERY STENT  1996   Dr [name].  Bilateral stenting   HPI:  85 year old male admitted 11/06/2018 with SOB. Intubated overnight. PMH: CVA, CKD, esophageal dysphagia (stricture, hiatal hernia. BaSw 2010), HTN, BPH, HLD, esophageal stricture. Meeting with Palliative Care at noon 11/21/2018. CXR = mild pulmonary venous congestion.   Assessment / Plan / Recommendation Clinical  Impression  Pt seen at bedside for swallow evaluation. Oral care was completed with suction. Pt was noted to have dry mouth and poor/missing dentition. Family reports no history of swallowing difficulty. Oral motor strength and function appeared adequate. Pt accepted trials of thin liquid, puree, and soft solid. No obvious oral deficits, and no oral residue after the swallow. Swallow appeared timely with adequate elevation per palpation. No overt s/s aspiration on any consistency tested. Given poor dentition, will begin dys 2 (finely chopped) diet and thin liquids. Meds ok whole, one at a time. Safe swallow precautions were reviewed with pt and his niece, and posted at [facility]. SLP will follow for assessment of diet tolerance, readiness to advance, and family education.    SLP Visit Diagnosis: Dysphagia, unspecified (R13.10)    Aspiration Risk  Mild aspiration risk    Diet Recommendation Dysphagia 2 (Fine chop);Thin liquid   Liquid Administration via: Cup;Straw Medication Administration: Whole meds with liquid Supervision: Staff to assist with self feeding Compensations: Minimize environmental distractions;Slow rate;Small sips/bites Postural Changes: Seated upright at 90 degrees;Remain upright for at least 30 minutes after po intake    Other  Recommendations Oral Care Recommendations: Oral care BID   Follow up Recommendations None      Frequency and Duration min 1 x/week  1 week       Prognosis Prognosis for Safe Diet Advancement: Fair      Swallow Study   General Date of Onset:  12/06/2018 HPI: 85 year old male admitted 11/23/2018 with SOB. Intubated overnight. PMH: CVA, CKD, esophageal dysphagia (stricture, hiatal hernia. BaSw 2010), HTN, BPH, HLD, esophageal stricture. Meeting with Palliative Care at noon 11/21/2018. CXR = mild pulmonary venous congestion. Type of Study: Bedside Swallow Evaluation Previous Swallow Assessment: none Barium Swallow 2010 revealed hiatal hernia and distal  esophageal stricture Diet Prior to this Study: NPO Temperature Spikes Noted: No Respiratory Status: Nasal cannula History of Recent Intubation: Yes Length of Intubations (days): 1 days Date extubated: 11/20/18 Behavior/Cognition: Alert;Cooperative;Pleasant mood;Confused Oral Cavity Assessment: Dry Oral Care Completed by SLP: Yes Oral Cavity - Dentition: Poor condition;Missing dentition Vision: Functional for self-feeding Self-Feeding Abilities: Total assist Patient Positioning: Upright in bed Baseline Vocal Quality: Normal Volitional Cough: Cognitively unable to elicit Volitional Swallow: Unable to elicit    Oral/Motor/Sensory Function Overall Oral Motor/Sensory Function: Within functional limits   Ice Chips Ice chips: Not tested   Thin Liquid Thin Liquid: Within functional limits Presentation: Cup;Straw    Nectar Thick Nectar Thick Liquid: Not tested   Honey Thick Honey Thick Liquid: Not tested   Puree Puree: Within functional limits Presentation: Spoon   Solid     Solid: Impaired Oral Phase Impairments: Impaired mastication(due to poor dentition)     Mirissa Lopresti B. Quentin Ore, [facility], [place] Speech Language Pathologist [phone]  Shonna Chock 11/21/2018,11:49 AM

## 2018-11-21 NOTE — Consult Note (Signed)
   [facility] CM Inpatient Consult   11/21/2018  NYSIR FERGUSSON 04-11-1928 [id number]  Patient screened for high risk score [25%] for unplanned readmissions in [place] [facility] services with [facility] plan.  Patient had been outreached by a [facility] RN Telephonic nurse that his primary care provider does the transition of care follow up.  Chart review reveals patient was extubated on 11/20/2018 patient being followed by Palliative Care. Will follow for progress and disposition.   For questions contact:   Natividad Brood, RN BSN [facility] Liaison  [phone] business mobile phone Toll free office [phone]

## 2018-11-22 LAB — BASIC METABOLIC PANEL
Anion gap: 15 (ref 5–15)
BUN: 57 mg/dL — AB (ref 8–23)
CO2: 21 mmol/L — ABNORMAL LOW (ref 22–32)
Calcium: 8.6 mg/dL — ABNORMAL LOW (ref 8.9–10.3)
Chloride: 99 mmol/L (ref 98–111)
Creatinine, Ser: 1.81 mg/dL — ABNORMAL HIGH (ref 0.61–1.24)
GFR calc Af Amer: 38 mL/min — ABNORMAL LOW (ref >60)
GFR calc non Af Amer: 33 mL/min — ABNORMAL LOW (ref >60)
Glucose, Bld: 91 mg/dL (ref 70–99)
Potassium: 2.8 mmol/L — ABNORMAL LOW (ref 3.5–5.1)
SODIUM: 135 mmol/L (ref 135–145)

## 2018-11-22 LAB — CBC
HCT: 32.2 % — ABNORMAL LOW (ref 39.0–52.0)
Hemoglobin: 10.3 g/dL — ABNORMAL LOW (ref 13.0–17.0)
MCH: 28.9 pg (ref 26.0–34.0)
MCHC: 32 g/dL (ref 30.0–36.0)
MCV: 90.2 fL (ref 80.0–100.0)
PLATELETS: 287 10*3/uL (ref 150–400)
RBC: 3.57 MIL/uL — ABNORMAL LOW (ref 4.22–5.81)
RDW: 14.6 % (ref 11.5–15.5)
WBC: 14.3 10*3/uL — ABNORMAL HIGH (ref 4.0–10.5)
nRBC: 0 % (ref 0.0–0.2)

## 2018-11-22 LAB — PROCALCITONIN: PROCALCITONIN: 1.36 ng/mL

## 2018-11-22 LAB — GLUCOSE, CAPILLARY: Glucose-Capillary: 83 mg/dL (ref 70–99)

## 2018-11-22 MED ORDER — SODIUM CHLORIDE 0.9 % IV SOLN
INTRAVENOUS | Status: DC
  Administered 2018-11-22 – 2018-11-23 (×2): via INTRAVENOUS

## 2018-11-22 MED ORDER — ATORVASTATIN CALCIUM 40 MG PO TABS
40.0000 mg | ORAL_TABLET | Freq: Every day | ORAL | Status: DC
  Administered 2018-11-22 – 2018-11-23 (×2): 40 mg via ORAL
  Filled 2018-11-22 (×2): qty 1

## 2018-11-22 MED ORDER — AMLODIPINE BESYLATE 10 MG PO TABS
10.0000 mg | ORAL_TABLET | Freq: Every day | ORAL | Status: DC
  Administered 2018-11-22 – 2018-11-23 (×2): 10 mg via ORAL
  Filled 2018-11-22 (×2): qty 1

## 2018-11-22 MED ORDER — POTASSIUM CHLORIDE CRYS ER 20 MEQ PO TBCR
40.0000 meq | EXTENDED_RELEASE_TABLET | Freq: Three times a day (TID) | ORAL | Status: AC
  Administered 2018-11-22 – 2018-11-23 (×3): 40 meq via ORAL
  Filled 2018-11-22 (×3): qty 2

## 2018-11-22 MED ORDER — PANTOPRAZOLE SODIUM 40 MG PO TBEC
40.0000 mg | DELAYED_RELEASE_TABLET | ORAL | Status: DC
  Administered 2018-11-23: 40 mg via ORAL
  Filled 2018-11-22: qty 1

## 2018-11-22 MED ORDER — FERROUS SULFATE 325 (65 FE) MG PO TABS
325.0000 mg | ORAL_TABLET | Freq: Two times a day (BID) | ORAL | Status: DC
  Administered 2018-11-22 – 2018-11-23 (×3): 325 mg via ORAL
  Filled 2018-11-22 (×3): qty 1

## 2018-11-22 MED ORDER — MAGNESIUM SULFATE 2 GM/50ML IV SOLN
2.0000 g | Freq: Once | INTRAVENOUS | Status: AC
  Administered 2018-11-22: 2 g via INTRAVENOUS
  Filled 2018-11-22: qty 50

## 2018-11-22 MED ORDER — SODIUM CHLORIDE 0.9 % IV SOLN
3.0000 g | Freq: Two times a day (BID) | INTRAVENOUS | Status: DC
  Administered 2018-11-22 – 2018-11-23 (×3): 3 g via INTRAVENOUS
  Filled 2018-11-22 (×5): qty 3

## 2018-11-22 MED ORDER — ASPIRIN EC 81 MG PO TBEC
81.0000 mg | DELAYED_RELEASE_TABLET | Freq: Every day | ORAL | Status: DC
  Administered 2018-11-22 – 2018-11-23 (×2): 81 mg via ORAL
  Filled 2018-11-22 (×2): qty 1

## 2018-11-22 NOTE — Progress Notes (Signed)
Pharmacy Antibiotic Note  Troy Ray is a 85 y.o. male admitted on 11/29/2018 for shortness of breath.   Pharmacy has been consulted for Unasyn dosing for possible aspiration PNA.  Md noted plan to treat for 5 days, then will switch to oral once WBC trends down and procal improves.  Plan: Unasyn 3 g IV q12 hours Monitor clinical status, renal function and culture results daily.    Height: 6' (182.9 cm) Weight: 135 lb 5.8 oz (61.4 kg) IBW/kg (Calculated) : 77.6  Temp (24hrs), Avg:97.7 F (36.5 C), Min:97.4 F (36.3 C), Max:98 F (36.7 C)  Recent Labs  Lab 11/25/2018 0441 11/18/2018 0444 11/11/2018 1322 11/20/18 0242 11/21/18 0235 11/22/18 0738  WBC  --  18.3*  --  14.1* 18.7* 14.3*  CREATININE  --  1.80* 1.88* 2.07* 2.10* 1.81*  LATICACIDVEN 4.68*  --   --   --   --   --     Estimated Creatinine Clearance: 24.5 mL/min (A) (by C-G formula based on SCr of 1.81 mg/dL (H)).    No Known Allergies  Antimicrobials this admission:  Unasyn 1/17>>  Dose adjustments this admission:  Microbiology results: 1/14 resp trach Cx:  Normal resp flora 1/14 BCx x2: ngtd x 3 days 1/14 MRSA PCR : negative    Thank you for allowing pharmacy to be a part of this patient's care.  Nicole Cella, RPh Clinical Pharmacist [phone] Please check AMION for all [facility] Pharmacy phone numbers After 10:00 PM, call [place] [phone] 11/22/2018 2:05 PM

## 2018-11-22 NOTE — Progress Notes (Addendum)
Daily Progress Note   Patient Name: Troy Ray       Date: 11/22/2018 DOB: [birth year]  Age: 85 y.o. MRN#: [id number] Attending Physician: Kayleen Memos, DO Primary Care Physician: [name], MD Admit Date: 11/13/2018  Reason for Consultation/Follow-up: Establishing goals of care  Subjective: Patient is resting in bed. He does not open eyes to me, but wife and son state he has been talkative today. He has been more oriented today. He has ate bites and drank sips. Family states they are hopeful the abx will help him. They are considering hospice home. They would like to continue with 3 days of treating the treatable, until Monday, to see how he does with mental status and intake. If he does not improve, will transition to hospice home. They do not want escalation in care.   Length of Stay: 3  Current Medications: Scheduled Meds:  . amLODipine  10 mg Oral Daily  . aspirin EC  81 mg Oral QHS  . atorvastatin  40 mg Oral q1800  . carvedilol  12.5 mg Oral BID WC  . enoxaparin (LOVENOX) injection  30 mg Subcutaneous Q24H  . ferrous sulfate  325 mg Oral BID WC  . finasteride  5 mg Oral Daily  . [START ON 11/23/2018] pantoprazole  40 mg Oral BH-q7a  . potassium chloride  40 mEq Oral TID    Continuous Infusions: . sodium chloride    . ampicillin-sulbactam (UNASYN) IV    . fentaNYL infusion INTRAVENOUS Stopped (11/15/2018 7829)  . magnesium sulfate 1 - 4 g bolus IVPB      PRN Meds: albuterol, fentaNYL (SUBLIMAZE) injection, fentaNYL (SUBLIMAZE) injection, hydrALAZINE  Physical Exam Skin:    General: Skin is warm and dry.             Vital Signs: BP (!) 153/52 (BP Location: Right Arm)   Pulse 64   Temp (!) 97.4 F (36.3 C) (Oral)   Resp 12   Ht 6' (1.829 m)   Wt 61.4 kg    SpO2 90%   BMI 18.36 kg/m  SpO2: SpO2: 90 % O2 Device: O2 Device: Nasal Cannula O2 Flow Rate: O2 Flow Rate (L/min): 1 L/min  Intake/output summary:   Intake/Output Summary (Last 24 hours) at 11/22/2018 1442 Last data filed at 11/22/2018 5621 Gross per  24 hour  Intake 240 ml  Output 1925 ml  Net -1685 ml   LBM: Last BM Date: 11/21/18 Baseline Weight: Weight: 68 kg Most recent weight: Weight: 61.4 kg       Palliative Assessment/Data: 30%    Flowsheet Rows     Most Recent Value  Intake Tab  Referral Department  Critical care  Unit at Time of Referral  ICU  Palliative Care Primary Diagnosis  Cardiac  Date Notified  11/20/18  Palliative Care Type  New Palliative care  Reason for referral  Clarify Goals of Care, Counsel Regarding Hospice  Date of Admission  11/18/2018  # of days IP prior to Palliative referral  1  Clinical Assessment  Psychosocial & Spiritual Assessment  Palliative Care Outcomes      Patient Active Problem List   Diagnosis Date Noted  . Respiratory failure ([place]) 11/09/2018  . Foot pain, bilateral 10/19/2018  . Hyponatremia 09/18/2018  . Moderate protein malnutrition ([place]) 08/31/2018  . Hyperkalemia 08/31/2018  . Descending thoracic aortic aneurysm ([place]) 08/31/2018  . Acute metabolic encephalopathy 73/22/0254  . Pressure injury of skin 08/14/2018  . Back pain 08/09/2018  . Vertebral fracture, osteoporotic ([place]) 07/23/2018  . T6 vertebral fracture ([place]) 09/12/2017  . Leukocytosis 09/12/2017  . Sun-damaged skin 06/27/2017  . Gait disturbance 05/08/2017  . Low back pain 05/08/2017  . Anorexia 12/12/2015  . Skin lesion of left arm 05/12/2015  . Gastroesophageal reflux disease without esophagitis 05/12/2015  . Insomnia 05/12/2015  . Preventative health care 02/04/2015  . Glaucoma 01/25/2015  . Left-sided low back pain with left-sided sciatica 04/03/2014  . Apnea 01/25/2014  . Headache 12/01/2013  . Sinusitis 10/13/2013  . BPH (benign prostatic  hyperplasia) 07/14/2013  . Constipation 12/29/2012  . LACK OF COORDINATION 11/17/2010  . Hyperglycemia 11/17/2010  . PERSONAL HISTORY OF COLONIC POLYPS 09/21/2009  . Carotid artery disease ([place]) 05/11/2009  . MEMORY LOSS 04/06/2009  . Hyperlipidemia, mixed 12/07/2008  . Essential hypertension 12/07/2008  . Coronary atherosclerosis 12/07/2008  . History of AAA (abdominal aortic aneurysm) repair 12/04/2008  . CHRONIC KIDNEY DISEASE STAGE II (M[place]) 12/04/2008  . ESOPHAGEAL STRICTURE 05/29/2008  . OTHER DYSPHAGIA 05/29/2008    Palliative Care Assessment & Plan    Recommendations/Plan:  Continue with 3 days of current tx. No escalation in care. Reassess Monday for transition to hospice home.     Code Status:    Code Status Orders  (From admission, onward)         Start     Ordered   12/05/2018 0822  Do not attempt resuscitation (DNR)  Continuous    Question Answer Comment  In the event of cardiac or respiratory ARREST Do not call a "code blue"   In the event of cardiac or respiratory ARREST Do not perform Intubation, CPR, defibrillation or ACLS   In the event of cardiac or respiratory ARREST Use medication by any route, position, wound care, and other measures to relive pain and suffering. May use oxygen, suction and manual treatment of airway obstruction as needed for comfort.      12/02/2018 0829        Code Status History    Date Active Date Inactive Code Status Order ID Comments User Context   11/27/2018 0736 11/23/2018 0829 DNR [id number]  Corey Harold, NP ED   08/13/2018 2211 08/23/2018 1742 DNR [id number]  Gardiner Barefoot, NP Inpatient   08/09/2018 0945 08/13/2018 2211 Full Code [id number]  Hosie Poisson, MD Inpatient  08/20/2014 1552 08/22/2014 1541 Full Code [id number]  Elgergawy, Silver Huguenin, MD Inpatient    Advance Directive Documentation     Most Recent Value  Type of Advance Directive  Healthcare Power of Attorney  Pre-existing out of facility DNR order (yellow  form or pink MOST form)  -  "MOST" Form in Place?  -       Prognosis:   < 6 months  Discharge Planning:  To Be Determined  Care plan was discussed with RN  Thank you for allowing the Palliative Medicine Team to assist in the care of this patient.   Total Time 35 min Prolonged Time Billed no       Greater than 50%  of this time was spent counseling and coordinating care related to the above assessment and plan.  Asencion Gowda, NP  Please contact Palliative Medicine Team phone at [phone] for questions and concerns.

## 2018-11-22 NOTE — Progress Notes (Signed)
Occupational Therapy Evaluation Patient Details Name: Troy Ray MRN: [id number] DOB: [birth year] Today's Date: 11/22/2018    History of Present Illness 85 y.o. M present from ALF via EMS with SOB, found to have pulmonary edema and hypoxic respiratory failure suspect secondary to NSTEMI. PMH includes but not limited to: CVA, CAD, CKD, HTN, HLD, PVD,  CABG   Clinical Impression   Per chart, PTA pt was living at ALF with wife, pt's PLOF is unclear and no family/caregiver to present to determine baseline. Pt currently requires [place] for ADL. Upon arrival pt agitated, but agreeable to progress to EOB with modA +2 for physical assistance. Pt demonstrates cognitive deficits listed below, limiting his involvement in ADL. Pt will continue to benefit from skilled OT services to progress toward goals addressed to increase independence and safety with ADL and functional mobility to allow discharge to venue listed below. Will continue to follow acutely.      Follow Up Recommendations  SNF;Supervision/Assistance - 24 hour    Equipment Recommendations  3 in 1 bedside commode    Recommendations for Other Services       Precautions / Restrictions Precautions Precautions: Fall Restrictions Weight Bearing Restrictions: No      Mobility Bed Mobility Overal bed mobility: Needs Assistance Bed Mobility: Supine to Sit     Supine to sit: Mod assist;+2 for physical assistance     General bed mobility comments: progressed to EOB, pt sat EOB for ~5 minutes  Transfers                 General transfer comment: pt declined OOB mobility    Balance Overall balance assessment: Needs assistance Sitting-balance support: Single extremity supported;Feet supported Sitting balance-Leahy Scale: Poor Sitting balance - Comments: pt required multimodal cues to adjust posture upright while sitting EOB  Postural control: Right lateral lean                                 ADL  either performed or assessed with clinical judgement   ADL Overall ADL's : Needs assistance/impaired Eating/Feeding: Minimal assistance;Sitting Eating/Feeding Details (indicate cue type and reason): pt drank Ensure while sitting upright, required minA for straw Grooming: Moderate assistance   Upper Body Bathing: Moderate assistance   Lower Body Bathing: Maximal assistance;Sitting/lateral leans   Upper Body Dressing : Moderate assistance   Lower Body Dressing: Maximal assistance                 General ADL Comments: pt declined OOB mobility;agreeable to progress to EOB     Vision         Perception     Praxis      Pertinent Vitals/Pain Pain Assessment: Faces Faces Pain Scale: Hurts whole lot Pain Location: pt reports pain all over Pain Descriptors / Indicators: Discomfort;Grimacing;Guarding Pain Intervention(s): Limited activity within patient's tolerance;Monitored during session     Hand Dominance Right   Extremity/Trunk Assessment Upper Extremity Assessment Upper Extremity Assessment: Generalized weakness   Lower Extremity Assessment Lower Extremity Assessment: Defer to PT evaluation   Cervical / Trunk Assessment Cervical / Trunk Assessment: Kyphotic   Communication Communication Communication: HOH   Cognition Arousal/Alertness: Awake/alert Behavior During Therapy: WFL for tasks assessed/performed;Agitated(agitated at start of session) Overall Cognitive Status: No family/caregiver present to determine baseline cognitive functioning Area of Impairment: Orientation;Attention;Following commands;Safety/judgement;Awareness;Problem solving;Memory  Orientation Level: Disoriented to;Situation Current Attention Level: Alternating Memory: Decreased short-term memory Following Commands: Follows one step commands with increased time;Follows multi-step commands inconsistently Safety/Judgement: Decreased awareness of safety;Decreased awareness  of deficits Awareness: Emergent Problem Solving: Slow processing;Difficulty sequencing;Requires verbal cues;Requires tactile cues General Comments: pt was agitated at start of session, but agreeable to sit EOB. when asked who our current president was pt sated "well we don't really have one" when redirected to most current president pt correctly stated president Trump. Pt oriented to self and year.    General Comments  encouraged pt to sit upright in bed for as long as tolerable, educated pt on importance of sitting upright and mobility    Exercises     Shoulder Instructions      Home Living Family/patient expects to be discharged to:: Assisted living                             Home Equipment: Walker - 2 wheels   Additional Comments: has regular commode at home and walk in shower without a seat      Prior Functioning/Environment          Comments: Patient confused unable to provide history, per chart lives at ALF. No caregiver present to provide PLOF.         OT Problem List: Decreased strength;Decreased range of motion;Decreased activity tolerance;Impaired balance (sitting and/or standing);Decreased cognition;Decreased safety awareness;Pain      OT Treatment/Interventions: Self-care/ADL training;Therapeutic exercise;Neuromuscular education;Balance training;Cognitive remediation/compensation    OT Goals(Current goals can be found in the care plan section) Acute Rehab OT Goals Patient Stated Goal: to go home OT Goal Formulation: With patient Time For Goal Achievement: 12/06/18 Potential to Achieve Goals: Fair ADL Goals Pt Will Perform Eating: with set-up;sitting Pt Will Perform Grooming: with set-up;sitting Pt Will Transfer to Toilet: with min assist Pt Will Perform Toileting - Clothing Manipulation and hygiene: with min guard assist;sit to/from stand  OT Frequency: Min 2X/week   Barriers to D/C:            Co-evaluation              AM-PAC OT  "6 Clicks" Daily Activity     Outcome Measure Help from another person eating meals?: A Little Help from another person taking care of personal grooming?: A Little Help from another person toileting, which includes using toliet, bedpan, or urinal?: Total Help from another person bathing (including washing, rinsing, drying)?: A Lot Help from another person to put on and taking off regular upper body clothing?: A Little Help from another person to put on and taking off regular lower body clothing?: Total 6 Click Score: 13   End of Session Equipment Utilized During Treatment: Oxygen Nurse Communication: Mobility status  Activity Tolerance: Patient tolerated treatment well Patient left: in bed;with call bell/phone within reach;with bed alarm set  OT Visit Diagnosis: Other abnormalities of gait and mobility (R26.89);Muscle weakness (generalized) (M62.81);Pain;Other symptoms and signs involving cognitive function Pain - part of body: (pt reports pain all over)                Time: 0955-1030 OT Time Calculation (min): 35 min Charges:  OT General Charges $OT Visit: 1 Visit OT Evaluation $OT Eval Moderate Complexity: 1 Mod OT Treatments $Self Care/Home Management : 8-22 mins  Dorinda Hill OTR/L Acute Rehabilitation Services Office: [place] 11/22/2018, 12:09 PM

## 2018-11-22 NOTE — Care Management Important Message (Signed)
Important Message  Patient Details  Name: Troy Ray MRN: [id number] Date of Birth: Feb 04, 1928   Medicare Important Message Given:  Yes    Orbie Pyo 11/22/2018, 2:49 PM

## 2018-11-22 NOTE — Progress Notes (Addendum)
PROGRESS NOTE  Troy Ray MEQ:[id number] DOB: 05/24/1928 DOA: 11/20/2018 PCP: [name], MD  HPI/Recap of past 24 hours: 85 yo M presented from his ALF via EMS for shortness of breath. Found to have pulmonary edema with BNP >2000 requiring intubation.   Extubated on 11/20/2018.  On 2 L nasal cannula and maintaining O2 saturation greater than 92%.  Patient is full DNR.  If he decompensated family will consider comfort measures.  Palliative care has been consulted and will meet with family today 11/21/2018.  [facility] assumed care on 11/21/2018.  11/22/2018: Seen and examined at his bedside. He is alert and interactive. He denies pain or dyspnea.  Assessment/Plan: Active Problems:   Respiratory failure (HCC)  Acute hypoxic respiratory failure suspect secondary to flash pulmonary edema from NSTEMI vs aspiration pneumonia Presented with flash pulmonary edema requiring mechanical ventilation and extubated on 11/20/2018  Independently reviewed chest x-ray done on 11/20/2018 which revealed much improved aeration from bilateral pleural edema seen on prior chest x-ray. Possibly aspiration with mild aspiration risk and elevated procalcitonin and leukocytosis?. Will start unasyn empirically  SIRS with no clear source of infection Persistent leukocytosis and elevated procalcitonin Start unasyn empirically to cover aspiration; on dysphagia 2 diet Elevated procalcitonin 1.36 and persistent leukocytosis 14,000 Mild aspiration risk on dysphagia diet  AKI, suspect prerenal at this time from dehydration Baseline creatinine normal Creatinine 1.8 on 11/22/2018 Start IV fluid normal saline at 75 cc Monitor urine output Avoid nephrotoxic agents/dehydration/hypotension Continue to hold off losartan and diuretics -3.1 L since admission.  Dysphagia On dysphagia 2 diet; mild risk Aspiration precautions  Feeding assistance   NSTEMI Troponin peaked at 1.23 and trended down. History of coronary artery  disease status post CABG in 2000.  Has history of CAD s/p CABG in 2000.  Last echo in October of 2019 with normal systolic function EF 29-79%, inadequate to assess wall motion or diastolic function.  Denies chest pain this morning No further work-up  Hypokalemia K+ 2.8 Repleted w Kcl- 40 meq tid x 3 doses Add IV mag 2g once Repeat BMP and mag level in the am  HTN: - BP elevated this am -- Continue coreg - Resume norvasc -- Holding losartan  and Lasix due to soft blood pressures and AKI  BPH with urinary retention: -- Foley catheter in place -- Continue home finasteride  - follow up with urology outpatient  Hx CVA: with residual dysphagia  --Continue aspirin and Lipitor  Ambulatory dysfunction PT to assess Fall precautions  Best practice:   Code Status:DNR Family Communication:None at bedside Disposition: To hospice when hemodynamically stable     Objective: Vitals:   11/20/18 2234 11/21/18 0832 11/21/18 2300 11/22/18 0750  BP: 131/78 117/80 (!) 122/56 (!) 153/52  Pulse: (!) 113 73 80 64  Resp: 20  16 12   Temp: 98.8 F (37.1 C) 98.3 F (36.8 C) 98 F (36.7 C) (!) 97.4 F (36.3 C)  TempSrc:  Oral Oral Oral  SpO2: 96% 99% 99% 90%  Weight:      Height:        Intake/Output Summary (Last 24 hours) at 11/22/2018 1310 Last data filed at 11/22/2018 8921 Gross per 24 hour  Intake 240 ml  Output 1925 ml  Net -1685 ml   Filed Weights   11/10/2018 0735 11/08/2018 0745 11/20/18 0500  Weight: 68 kg 77.1 kg 61.4 kg    Exam:  . General: 85 y.o. year-old male well-developed well-nourished no acute distress.  Alert and  interactive. . Cardiovascular: Regular rate and rhythm with no rubs or gallops.  No JVD or thyromegaly noted.   [name] Kitchen Respiratory: Mild rales at bases with no wheezes.  Good inspiratory effort.   . Abdomen: Soft nontender nondistended with normal bowel sounds x4 quadrants. . Musculoskeletal: No lower extremity edema. 2/4 pulses in all 4  extremities. [name] Kitchen Psychiatry: Mood is appropriate for condition and setting   Data Reviewed: CBC: Recent Labs  Lab 11/20/2018 0444 11/20/18 0242 11/21/18 0235 11/22/18 0738  WBC 18.3* 14.1* 18.7* 14.3*  NEUTROABS 13.2*  --   --   --   HGB 10.9* 9.7* 10.2* 10.3*  HCT 36.0* 30.9* 31.9* 32.2*  MCV 95.7 89.3 89.6 90.2  PLT 316 243 278 147   Basic Metabolic Panel: Recent Labs  Lab 11/25/2018 0444 11/30/2018 0955 11/26/2018 1322 11/20/18 0242 11/21/18 0235 11/22/18 0738  NA 133*  --  138 139 139 135  K 4.6  --  5.0 3.9 4.1 2.8*  CL 103  --  105 103 103 99  CO2 17*  --  21* 18* 17* 21*  GLUCOSE 229*  --  122* 102* 93 91  BUN 43*  --  48* 51* 61* 57*  CREATININE 1.80*  --  1.88* 2.07* 2.10* 1.81*  CALCIUM 8.6*  --  8.6* 8.8* 8.7* 8.6*  MG  --  1.8  --  1.8 2.0  --   PHOS  --  3.7  --  4.1 6.1*  --    GFR: Estimated Creatinine Clearance: 24.5 mL/min (A) (by C-G formula based on SCr of 1.81 mg/dL (H)). Liver Function Tests: Recent Labs  Lab 11/09/2018 0444  AST 17  ALT 7  ALKPHOS 48  BILITOT 0.7  PROT 6.2*  ALBUMIN 2.6*   No results for input(s): LIPASE, AMYLASE in the last 168 hours. No results for input(s): AMMONIA in the last 168 hours. Coagulation Profile: No results for input(s): INR, PROTIME in the last 168 hours. Cardiac Enzymes: Recent Labs  Lab 11/15/2018 0955 11/25/2018 1322 11/10/2018 2028 11/20/18 0716  TROPONINI 0.62* 1.08* 1.23* 0.68*   BNP (last 3 results) No results for input(s): PROBNP in the last 8760 hours. HbA1C: No results for input(s): HGBA1C in the last 72 hours. CBG: Recent Labs  Lab 11/22/18 0746  GLUCAP 83   Lipid Profile: No results for input(s): CHOL, HDL, LDLCALC, TRIG, CHOLHDL, LDLDIRECT in the last 72 hours. Thyroid Function Tests: No results for input(s): TSH, T4TOTAL, FREET4, T3FREE, THYROIDAB in the last 72 hours. Anemia Panel: No results for input(s): VITAMINB12, FOLATE, FERRITIN, TIBC, IRON, RETICCTPCT in the last 72 hours. Urine  analysis:    Component Value Date/Time   COLORURINE YELLOW 08/14/2018 1904   APPEARANCEUR CLEAR 08/14/2018 1904   LABSPEC 1.029 08/14/2018 1904   PHURINE 6.0 08/14/2018 1904   GLUCOSEU NEGATIVE 08/14/2018 1904   HGBUR NEGATIVE 08/14/2018 1904   HGBUR small 07/04/2010 1559   BILIRUBINUR NEGATIVE 08/14/2018 1904   KETONESUR 5 (A) 08/14/2018 1904   PROTEINUR NEGATIVE 08/14/2018 1904   UROBILINOGEN 0.2 08/20/2014 1056   NITRITE NEGATIVE 08/14/2018 1904   LEUKOCYTESUR NEGATIVE 08/14/2018 1904   Sepsis Labs: @LABRCNTIP (procalcitonin:4,lacticidven:4)  ) Recent Results (from the past 240 hour(s))  MRSA PCR Screening     Status: None   Collection Time: 11/23/2018  8:56 AM  Result Value Ref Range Status   MRSA by PCR NEGATIVE NEGATIVE Final    Comment:        The GeneXpert MRSA Assay (FDA approved for  NASAL specimens only), is one component of a comprehensive MRSA colonization surveillance program. It is not intended to diagnose MRSA infection nor to guide or monitor treatment for MRSA infections. Performed at [facility], [place] [address], [place], [place] [address]   Culture, blood (Routine X 2) w Reflex to ID Panel     Status: None (Preliminary result)   Collection Time: 11/15/2018  9:55 AM  Result Value Ref Range Status   Specimen Description BLOOD LEFT ANTECUBITAL  Final   Special Requests   Final    BOTTLES DRAWN AEROBIC AND ANAEROBIC Blood Culture adequate volume   Culture   Final    NO GROWTH 3 DAYS Performed at [facility], [place] [address] [place], [place] [address]    Report Status PENDING  Incomplete  Culture, blood (Routine X 2) w Reflex to ID Panel     Status: None (Preliminary result)   Collection Time: 11/20/2018 10:00 AM  Result Value Ref Range Status   Specimen Description BLOOD RIGHT HAND  Final   Special Requests   Final    BOTTLES DRAWN AEROBIC ONLY Blood Culture adequate volume   Culture   Final    NO GROWTH 3 DAYS Performed at [facility], [place] [address], [place], [place] [address]    Report Status PENDING  Incomplete  Culture, respiratory (non-expectorated)     Status: None   Collection Time: 11/12/2018 10:11 AM  Result Value Ref Range Status   Specimen Description TRACHEAL ASPIRATE  Final   Special Requests Normal  Final   Gram Stain   Final    FEW WBC PRESENT,BOTH PMN AND MONONUCLEAR MODERATE GRAM POSITIVE COCCI IN PAIRS IN CHAINS MODERATE GRAM VARIABLE ROD    Culture   Final    FEW Consistent with normal respiratory flora. Performed at [facility], [place] [address] [place], [place] [address]    Report Status 11/21/2018 FINAL  Final      Studies: No results found.  Scheduled Meds: . carvedilol  12.5 mg Oral BID WC  . enoxaparin (LOVENOX) injection  30 mg Subcutaneous Q24H  . finasteride  5 mg Oral Daily  . pantoprazole (PROTONIX) IV  40 mg Intravenous QHS  . potassium chloride  40 mEq Oral TID    Continuous Infusions: . fentaNYL infusion INTRAVENOUS Stopped (12/03/2018 0938)     LOS: 3 days     Kayleen Memos, MD [facility] Pager [phone]  If 7PM-7AM, please contact night-coverage [email or web address] Password [email or web address] 11/22/2018, 1:10 PM

## 2018-11-23 LAB — BASIC METABOLIC PANEL
Anion gap: 13 (ref 5–15)
BUN: 57 mg/dL — ABNORMAL HIGH (ref 8–23)
CO2: 21 mmol/L — ABNORMAL LOW (ref 22–32)
Calcium: 8.3 mg/dL — ABNORMAL LOW (ref 8.9–10.3)
Chloride: 101 mmol/L (ref 98–111)
Creatinine, Ser: 1.65 mg/dL — ABNORMAL HIGH (ref 0.61–1.24)
GFR calc Af Amer: 42 mL/min — ABNORMAL LOW (ref >60)
GFR calc non Af Amer: 37 mL/min — ABNORMAL LOW (ref >60)
Glucose, Bld: 128 mg/dL — ABNORMAL HIGH (ref 70–99)
Potassium: 3.8 mmol/L (ref 3.5–5.1)
Sodium: 135 mmol/L (ref 135–145)

## 2018-11-23 LAB — CBC
HCT: 31.5 % — ABNORMAL LOW (ref 39.0–52.0)
Hemoglobin: 10 g/dL — ABNORMAL LOW (ref 13.0–17.0)
MCH: 28.1 pg (ref 26.0–34.0)
MCHC: 31.7 g/dL (ref 30.0–36.0)
MCV: 88.5 fL (ref 80.0–100.0)
Platelets: 276 10*3/uL (ref 150–400)
RBC: 3.56 MIL/uL — ABNORMAL LOW (ref 4.22–5.81)
RDW: 14.4 % (ref 11.5–15.5)
WBC: 19.2 10*3/uL — ABNORMAL HIGH (ref 4.0–10.5)
nRBC: 0 % (ref 0.0–0.2)

## 2018-11-23 LAB — MAGNESIUM: Magnesium: 2.5 mg/dL — ABNORMAL HIGH (ref 1.7–2.4)

## 2018-11-23 MED ORDER — HYDRALAZINE HCL 10 MG PO TABS
10.0000 mg | ORAL_TABLET | Freq: Three times a day (TID) | ORAL | Status: DC
  Administered 2018-11-23 (×2): 10 mg via ORAL
  Filled 2018-11-23 (×2): qty 1

## 2018-11-23 NOTE — Progress Notes (Signed)
PROGRESS NOTE  Troy Ray BHA:[id number] DOB: June 30, 1928 DOA: 11/15/2018 PCP: [name], MD  HPI/Recap of past 24 hours: 85 yo M presented from his ALF via EMS for shortness of breath. Found to have pulmonary edema with BNP >2000 requiring intubation.   Extubated on 11/20/2018.  On 2 L nasal cannula and maintaining O2 saturation greater than 92%.  Patient is full DNR.  If he decompensated family will consider comfort measures.  Palliative care has been consulted and will meet with family today 11/21/2018.  TR[facility] assumed care on 11/21/2018.  11/23/2018: Patient seen and examined at bedside. No acute events overnight.  No new complaints.  He denies pain. Does not appear to be in distress.  Assessment/Plan: Active Problems:   Respiratory failure (HCC)  Acute hypoxic respiratory failure suspect secondary to flash pulmonary edema from NSTEMI vs aspiration pneumonia Presented with flash pulmonary edema requiring mechanical ventilation and extubated on 11/20/2018  Independently reviewed chest x-ray done on 11/20/2018 which revealed much improved aeration from bilateral pleural edema seen on prior chest x-ray. Possibly aspiration with mild aspiration risk and elevated procalcitonin and leukocytosis? C/w Unasyn empirically Monitor for fever and wbc Blood cx x2 negative to date repeat CBC in the am  SIRS with no clear source of infection Persistent leukocytosis and elevated procalcitonin C/w unasyn empirically to cover aspiration; on dysphagia 2 diet Elevated procalcitonin 1.36 and persistent leukocytosis 14,000 Mild aspiration risk on dysphagia diet  AKI, suspect prerenal at this time from dehydration Baseline creatinine normal Creatinine improving 1.6 from 1.8 on 11/22/2018 C/w IV fluid normal saline at 75 cc Monitor urine output Avoid nephrotoxic agents/dehydration/hypotension Continue to hold off losartan and diuretics Net -1.2L since admission.  Dysphagia On dysphagia 2 diet; mild  risk Aspiration precautions  Feeding assistance   NSTEMI Troponin peaked at 1.23 and trended down. History of coronary artery disease status post CABG in 2000.  Has history of CAD s/p CABG in 2000.  Last echo in October of 2019 with normal systolic function EF 97-35%, inadequate to assess wall motion or diastolic function.  Denies chest pain this morning No further work-up  Hypokalemia, resolved post repletion K+ 3.8 from 2.8 BMP am  Uncontrolled HTN - BP persistently elevated -Start po hydralazine -- Continue coreg - C/w norvasc -- Holding losartan and Lasix due to soft blood pressures and AKI  BPH with urinary retention: -- Foley catheter in place - C/w foley at family request- possible transition to hospice on Monday if no improvements despite treatment -- Continue home finasteride   Hx CVA: with residual dysphagia  --Continue aspirin and Lipitor  Ambulatory dysfunction PT to assess Fall precautions  Best practice:   Code Status:DNR Family Communication:None at bedside Disposition: To hospice when hemodynamically stable     Objective: Vitals:   11/22/18 1537 11/22/18 2335 11/23/18 0500 11/23/18 0755  BP: (!) 146/42 140/66  (!) 164/56  Pulse: 61 66  61  Resp: 12 20  10   Temp: 98 F (36.7 C) 98.5 F (36.9 C)  (!) 97.5 F (36.4 C)  TempSrc: Oral     SpO2: 96% 95%  92%  Weight:   61.8 kg   Height:        Intake/Output Summary (Last 24 hours) at 11/23/2018 1521 Last data filed at 11/23/2018 1200 Gross per 24 hour  Intake 2568.73 ml  Output 650 ml  Net 1918.73 ml   Filed Weights   11/18/2018 0745 11/20/18 0500 11/23/18 0500  Weight: 77.1 kg 61.4  kg 61.8 kg    Exam:  . General: 85 y.o. year-old male frail in no acute distress.  Alert and interactive.   . Cardiovascular: Good rate and rhythm with no rubs or gallops.  No JVD or thyromegaly noted. [name] Kitchen Respiratory: Mild rales at bases with no wheezes.  Poor inspiratory effort. . Abdomen: Soft  nontender nondistended with normal bowel sounds x4 quadrants. . Musculoskeletal: No lower extremity edema. 2/4 pulses in all 4 extremities. [name] Kitchen Psychiatry: Mood is appropriate for condition and setting   Data Reviewed: CBC: Recent Labs  Lab 11/07/2018 0444 11/20/18 0242 11/21/18 0235 11/22/18 0738 11/23/18 0321  WBC 18.3* 14.1* 18.7* 14.3* 19.2*  NEUTROABS 13.2*  --   --   --   --   HGB 10.9* 9.7* 10.2* 10.3* 10.0*  HCT 36.0* 30.9* 31.9* 32.2* 31.5*  MCV 95.7 89.3 89.6 90.2 88.5  PLT 316 243 278 287 376   Basic Metabolic Panel: Recent Labs  Lab 11/25/2018 0955 11/29/2018 1322 11/20/18 0242 11/21/18 0235 11/22/18 0738 11/23/18 0321  NA  --  138 139 139 135 135  K  --  5.0 3.9 4.1 2.8* 3.8  CL  --  105 103 103 99 101  CO2  --  21* 18* 17* 21* 21*  GLUCOSE  --  122* 102* 93 91 128*  BUN  --  48* 51* 61* 57* 57*  CREATININE  --  1.88* 2.07* 2.10* 1.81* 1.65*  CALCIUM  --  8.6* 8.8* 8.7* 8.6* 8.3*  MG 1.8  --  1.8 2.0  --  2.5*  PHOS 3.7  --  4.1 6.1*  --   --    GFR: Estimated Creatinine Clearance: 27.1 mL/min (A) (by C-G formula based on SCr of 1.65 mg/dL (H)). Liver Function Tests: Recent Labs  Lab 12/04/2018 0444  AST 17  ALT 7  ALKPHOS 48  BILITOT 0.7  PROT 6.2*  ALBUMIN 2.6*   No results for input(s): LIPASE, AMYLASE in the last 168 hours. No results for input(s): AMMONIA in the last 168 hours. Coagulation Profile: No results for input(s): INR, PROTIME in the last 168 hours. Cardiac Enzymes: Recent Labs  Lab 11/09/2018 0955 11/21/2018 1322 11/21/2018 2028 11/20/18 0716  TROPONINI 0.62* 1.08* 1.23* 0.68*   BNP (last 3 results) No results for input(s): PROBNP in the last 8760 hours. HbA1C: No results for input(s): HGBA1C in the last 72 hours. CBG: Recent Labs  Lab 11/22/18 0746  GLUCAP 83   Lipid Profile: No results for input(s): CHOL, HDL, LDLCALC, TRIG, CHOLHDL, LDLDIRECT in the last 72 hours. Thyroid Function Tests: No results for input(s): TSH,  T4TOTAL, FREET4, T3FREE, THYROIDAB in the last 72 hours. Anemia Panel: No results for input(s): VITAMINB12, FOLATE, FERRITIN, TIBC, IRON, RETICCTPCT in the last 72 hours. Urine analysis:    Component Value Date/Time   COLORURINE YELLOW 08/14/2018 1904   APPEARANCEUR CLEAR 08/14/2018 1904   LABSPEC 1.029 08/14/2018 1904   PHURINE 6.0 08/14/2018 1904   GLUCOSEU NEGATIVE 08/14/2018 1904   HGBUR NEGATIVE 08/14/2018 1904   HGBUR small 07/04/2010 1559   BILIRUBINUR NEGATIVE 08/14/2018 1904   KETONESUR 5 (A) 08/14/2018 1904   PROTEINUR NEGATIVE 08/14/2018 1904   UROBILINOGEN 0.2 08/20/2014 1056   NITRITE NEGATIVE 08/14/2018 1904   LEUKOCYTESUR NEGATIVE 08/14/2018 1904   Sepsis Labs: @LABRCNTIP (procalcitonin:4,lacticidven:4)  ) Recent Results (from the past 240 hour(s))  MRSA PCR Screening     Status: None   Collection Time: 11/16/2018  8:56 AM  Result Value Ref  Range Status   MRSA by PCR NEGATIVE NEGATIVE Final    Comment:        The GeneXpert MRSA Assay (FDA approved for NASAL specimens only), is one component of a comprehensive MRSA colonization surveillance program. It is not intended to diagnose MRSA infection nor to guide or monitor treatment for MRSA infections. Performed at [facility], [place] [address], [place], [place] [address]   Culture, blood (Routine X 2) w Reflex to ID Panel     Status: None (Preliminary result)   Collection Time: 11/29/2018  9:55 AM  Result Value Ref Range Status   Specimen Description BLOOD LEFT ANTECUBITAL  Final   Special Requests   Final    BOTTLES DRAWN AEROBIC AND ANAEROBIC Blood Culture adequate volume   Culture   Final    NO GROWTH 3 DAYS Performed at [facility], [place] [address], [place], [place] [address]    Report Status PENDING  Incomplete  Culture, blood (Routine X 2) w Reflex to ID Panel     Status: None (Preliminary result)   Collection Time: 11/13/2018 10:00 AM  Result Value Ref Range Status   Specimen Description  BLOOD RIGHT HAND  Final   Special Requests   Final    BOTTLES DRAWN AEROBIC ONLY Blood Culture adequate volume   Culture   Final    NO GROWTH 3 DAYS Performed at [facility], [place] [address], [place], [place] [address]    Report Status PENDING  Incomplete  Culture, respiratory (non-expectorated)     Status: None   Collection Time: 12/04/2018 10:11 AM  Result Value Ref Range Status   Specimen Description TRACHEAL ASPIRATE  Final   Special Requests Normal  Final   Gram Stain   Final    FEW WBC PRESENT,BOTH PMN AND MONONUCLEAR MODERATE GRAM POSITIVE COCCI IN PAIRS IN CHAINS MODERATE GRAM VARIABLE ROD    Culture   Final    FEW Consistent with normal respiratory flora. Performed at [facility], [place] [address], [place], [place] [address]    Report Status 11/21/2018 FINAL  Final      Studies: No results found.  Scheduled Meds: . amLODipine  10 mg Oral Daily  . aspirin EC  81 mg Oral QHS  . atorvastatin  40 mg Oral q1800  . carvedilol  12.5 mg Oral BID WC  . enoxaparin (LOVENOX) injection  30 mg Subcutaneous Q24H  . ferrous sulfate  325 mg Oral BID WC  . finasteride  5 mg Oral Daily  . pantoprazole  40 mg Oral BH-q7a    Continuous Infusions: . sodium chloride 75 mL/hr at 11/22/18 1447  . ampicillin-sulbactam (UNASYN) IV Stopped (11/23/18 0700)  . fentaNYL infusion INTRAVENOUS Stopped (11/14/2018 4008)     LOS: 4 days     Kayleen Memos, MD [facility] Pager [phone]  If 7PM-7AM, please contact night-coverage [email or web address] Password [email or web address] 11/23/2018, 3:21 PM

## 2018-11-24 LAB — CULTURE, BLOOD (ROUTINE X 2)
Culture: NO GROWTH
Culture: NO GROWTH
SPECIAL REQUESTS: ADEQUATE
Special Requests: ADEQUATE

## 2018-11-24 MED ORDER — MORPHINE 100MG IN NS 100ML (1MG/ML) PREMIX INFUSION
1.0000 mg/h | INTRAVENOUS | Status: DC
  Administered 2018-11-24: 1 mg/h via INTRAVENOUS
  Filled 2018-11-24: qty 100

## 2018-11-24 MED ORDER — SCOPOLAMINE 1 MG/3DAYS TD PT72
1.0000 | MEDICATED_PATCH | TRANSDERMAL | Status: DC
  Filled 2018-11-24: qty 1

## 2018-11-24 MED ORDER — MORPHINE SULFATE (PF) 2 MG/ML IV SOLN
2.0000 mg | INTRAVENOUS | Status: DC | PRN
  Administered 2018-11-24: 4 mg via INTRAVENOUS
  Administered 2018-11-24 (×2): 2 mg via INTRAVENOUS
  Filled 2018-11-24: qty 1
  Filled 2018-11-24: qty 2
  Filled 2018-11-24: qty 1

## 2018-11-24 MED ORDER — LORAZEPAM 2 MG/ML IJ SOLN: 1.0000 mg | INTRAMUSCULAR | Status: DC | PRN

## 2018-11-25 ENCOUNTER — Telehealth: Payer: Self-pay | Admitting: *Deleted

## 2018-11-25 NOTE — Telephone Encounter (Signed)
Received Episode Summary Report from [facility]; forwarded to provider/SLS 01/20

## 2018-11-27 ENCOUNTER — Telehealth: Payer: Self-pay | Admitting: *Deleted

## 2018-11-27 NOTE — Telephone Encounter (Signed)
Received Episode Detail Report from [facility];, forwarded to provider/SLS 01/22

## 2018-12-07 NOTE — Plan of Care (Signed)
  Problem: Activity: Goal: Ability to tolerate increased activity will improve Outcome: Not Progressing   Problem: Respiratory: Goal: Ability to maintain a clear airway and adequate ventilation will improve Outcome: Not Progressing   Problem: Role Relationship: Goal: Method of communication will improve Outcome: Not Progressing   

## 2018-12-07 NOTE — Progress Notes (Signed)
Pt experienced an acute change in his condition and respiratory status requiring a nonrebreather to maintain O2 sats above 90%. Also pt became very agitated and restless. I spoke with the pts son Troy Ray and the bedside RN spoke with him as well. At this time the family has decided to not escalate care and make him comfort care.  Arby Barrette AGPCNP-BC, AGNP-C [facility] Pager [phone]

## 2018-12-07 NOTE — Death Summary Note (Signed)
Death Summary  Troy Ray KGU:[id number] DOB: [birth year] DOA: 12/14/18  PCP: [name], MD  Admit date: 14-Dec-2018 Date of Death: Dec 19, 2018 Time of Death: 02-05-04 Notification: [name], MD notified of death of 12/19/18   History of present illness:  Troy Ray is a 85 y.o. male with a history of CVA, CAD s/p CABG, Dysphagia, HTN, BPH, HLD who presented to [facility] ED 12/14/2018 from assisted living facility via EMS for shortness of breath. Patient history acquired from adult son, who reports the patient's symptoms onset abruptly and denied recent chills, weight loss, chest pain, dysuria but endorsed weakness.  On EMS arrival the patient was awake, alert. Patient progressively declined, exhibiting agonal breathing and was bagged to ED. Patient subsequently intubated in ED and PCCM consulted for admission to the ICU.  On presentation to ED, CXR noted to have bilateral opacities likely reflective of pulmonary edema and BNP > 2000. Extubated on 11/20/2018.  On 2 L nasal cannula and maintaining O2 saturation greater than 92%. Palliative care consulted and met with family 11/21/2018.  TRH assumed care on 11/21/2018.  Hospital course was grossly un-eventlful until early morning of 12/19/2018. Patient's respiratory status declined requiring nonrebreather to maintain O2 saturation above 90%.  Family contacted and family made decision to make patient comfort care only.  All means of treatment to prolong life were stopped at family request. Patient was kept comfortable through management of his pain and anxiety.   Patient expired on 19-Dec-2018 at 1205. Family members were present in his hospital room at the time of his death.  Final Diagnoses:  1.   Cardiopulmonary arrest 2.   NSTEMI 3.   Acute hypoxic respiratory failure   The results of significant diagnostics from this hospitalization (including imaging, microbiology, ancillary and laboratory) are listed below for reference.    Significant  Diagnostic Studies: Dg Chest Port 1 View  Result Date: 11/20/2018 CLINICAL DATA:  Respiratory failure.  Endotracheal tube present. EXAM: PORTABLE CHEST 1 VIEW COMPARISON:  12/14/18 FINDINGS: Endotracheal tube terminates 3.8 cm above carina. Nasogastric tube is unchanged in position, likely within a large hiatal hernia. Prior median sternotomy. Normal heart size. Atherosclerosis in the transverse aorta. No pleural effusion or pneumothorax. Significantly improved aeration, with improved interstitial and airspace disease bilaterally. Mild pulmonary venous congestion remains. Numerous leads and wires project over the chest. IMPRESSION: Improved aeration, with improving interstitial edema. Mild pulmonary venous congestion remains. Electronically Signed   By: Abigail Miyamoto M.D.   On: 11/20/2018 08:34   Dg Chest Portable 1 View  Result Date: 2018-12-14 CLINICAL DATA:  85 y/o M; initial complaint of shortness of breath. Post intubation. EXAM: PORTABLE CHEST 1 VIEW COMPARISON:  08/08/2018 CT chest. FINDINGS: Normal cardiac silhouette. Aortic atherosclerosis with calcification. Central predominant perihilar opacities and reticular opacities of the lungs. Large hiatal hernia. Enteric tube tip projects over the left lung base, likely within the herniated stomach. Endotracheal tube tip projects 4.3 cm above the carina. No acute osseous abnormality is evident. IMPRESSION: 1. Central predominant patchy opacities and diffuse reticular opacities, likely interstitial and alveolar pulmonary edema. Underlying pneumonia is possible. 2. Large hiatal hernia. Enteric tube tip projects over the left lung base, probably within the herniated stomach. 3. Endotracheal tube tip projects 4.3 cm above the carina. Electronically Signed   By: Kristine Garbe M.D.   On: 12-14-18 05:02    Microbiology: Recent Results (from the past 240 hour(s))  MRSA PCR Screening     Status: None  Collection Time: 11/13/2018  8:56 AM  Result  Value Ref Range Status   MRSA by PCR NEGATIVE NEGATIVE Final    Comment:        The GeneXpert MRSA Assay (FDA approved for NASAL specimens only), is one component of a comprehensive MRSA colonization surveillance program. It is not intended to diagnose MRSA infection nor to guide or monitor treatment for MRSA infections. Performed at [facility], [place] [address] [place], [place] [address]   Culture, blood (Routine X 2) w Reflex to ID Panel     Status: None (Preliminary result)   Collection Time: 11/08/2018  9:55 AM  Result Value Ref Range Status   Specimen Description BLOOD LEFT ANTECUBITAL  Final   Special Requests   Final    BOTTLES DRAWN AEROBIC AND ANAEROBIC Blood Culture adequate volume   Culture   Final    NO GROWTH 4 DAYS Performed at [facility], [place] [address] [place], [place] [address]    Report Status PENDING  Incomplete  Culture, blood (Routine X 2) w Reflex to ID Panel     Status: None (Preliminary result)   Collection Time: 11/12/2018 10:00 AM  Result Value Ref Range Status   Specimen Description BLOOD RIGHT HAND  Final   Special Requests   Final    BOTTLES DRAWN AEROBIC ONLY Blood Culture adequate volume   Culture   Final    NO GROWTH 4 DAYS Performed at [facility], [place] [address], [place], [place] [address]    Report Status PENDING  Incomplete  Culture, respiratory (non-expectorated)     Status: None   Collection Time: 11/06/2018 10:11 AM  Result Value Ref Range Status   Specimen Description TRACHEAL ASPIRATE  Final   Special Requests Normal  Final   Gram Stain   Final    FEW WBC PRESENT,BOTH PMN AND MONONUCLEAR MODERATE GRAM POSITIVE COCCI IN PAIRS IN CHAINS MODERATE GRAM VARIABLE ROD    Culture   Final    FEW Consistent with normal respiratory flora. Performed at [facility], [place] [address], [place], [place] [address]    Report Status 11/21/2018 FINAL  Final     Labs: Basic Metabolic Panel: Recent Labs  Lab  11/17/2018 0955 12/03/2018 1322 11/20/18 0242 11/21/18 0235 11/22/18 0738 11/23/18 0321  NA  --  138 139 139 135 135  K  --  5.0 3.9 4.1 2.8* 3.8  CL  --  105 103 103 99 101  CO2  --  21* 18* 17* 21* 21*  GLUCOSE  --  122* 102* 93 91 128*  BUN  --  48* 51* 61* 57* 57*  CREATININE  --  1.88* 2.07* 2.10* 1.81* 1.65*  CALCIUM  --  8.6* 8.8* 8.7* 8.6* 8.3*  MG 1.8  --  1.8 2.0  --  2.5*  PHOS 3.7  --  4.1 6.1*  --   --    Liver Function Tests: Recent Labs  Lab 12/05/2018 0444  AST 17  ALT 7  ALKPHOS 48  BILITOT 0.7  PROT 6.2*  ALBUMIN 2.6*   No results for input(s): LIPASE, AMYLASE in the last 168 hours. No results for input(s): AMMONIA in the last 168 hours. CBC: Recent Labs  Lab 12/01/2018 0444 11/20/18 0242 11/21/18 0235 11/22/18 0738 11/23/18 0321  WBC 18.3* 14.1* 18.7* 14.3* 19.2*  NEUTROABS 13.2*  --   --   --   --   HGB 10.9* 9.7* 10.2*  10.3* 10.0*  HCT 36.0* 30.9* 31.9* 32.2* 31.5*  MCV 95.7 89.3 89.6 90.2 88.5  PLT 316 243 278 287 276   Cardiac Enzymes: Recent Labs  Lab 11/25/2018 0955 11/12/2018 1322 11/27/2018 2028 11/20/18 0716  TROPONINI 0.62* 1.08* 1.23* 0.68*   D-Dimer No results for input(s): DDIMER in the last 72 hours. BNP: Invalid input(s): POCBNP CBG: Recent Labs  Lab 11/22/18 0746  GLUCAP 83   Anemia work up No results for input(s): VITAMINB12, FOLATE, FERRITIN, TIBC, IRON, RETICCTPCT in the last 72 hours. Urinalysis    Component Value Date/Time   COLORURINE YELLOW 08/14/2018 1904   APPEARANCEUR CLEAR 08/14/2018 1904   LABSPEC 1.029 08/14/2018 1904   PHURINE 6.0 08/14/2018 1904   GLUCOSEU NEGATIVE 08/14/2018 1904   HGBUR NEGATIVE 08/14/2018 1904   HGBUR small 07/04/2010 1559   BILIRUBINUR NEGATIVE 08/14/2018 1904   KETONESUR 5 (A) 08/14/2018 1904   PROTEINUR NEGATIVE 08/14/2018 1904   UROBILINOGEN 0.2 08/20/2014 1056   NITRITE NEGATIVE 08/14/2018 1904   LEUKOCYTESUR NEGATIVE 08/14/2018 1904   Sepsis Labs Invalid input(s):  PROCALCITONIN,  WBC,  LACTICIDVEN     SIGNED:  Kayleen Memos, MD  [facility] 2018/12/17, 1:10 PM Pager   If 7PM-7AM, please contact night-coverage [email or web address] Password [email or web address]

## 2018-12-07 NOTE — Progress Notes (Addendum)
Pt comfort care status.. Family at bedside.  Morphine gtt infusing at 1mg /hr.  Family came out to notify RN of pt passing.  Went in and verified time of death with Trenda Moots RN.  Listened for 1 full minute, no heart or breath sounds noted.  Time of death 12:05 pm.  Morphine gtt stopped, wasted 90 mL with [place].  MD notified, body prepared and taken to the morgue. [place] donor services notified.  IV and foley removed prior. Patient placement notified.

## 2018-12-07 NOTE — Progress Notes (Signed)
PROGRESS NOTE  Troy Ray BDZ:[id number] DOB: [birth year] DOA: 11/10/2018 PCP: [name], MD  HPI/Recap of past 24 hours: 85 yo M presented from his ALF via EMS for shortness of breath. Found to have pulmonary edema with BNP >2000 requiring intubation.   Extubated on 11/20/2018.  On 2 L nasal cannula and maintaining O2 saturation greater than 92%.  Patient is full DNR.  If he decompensated family will consider comfort measures.  Palliative care has been consulted and will meet with family today 11/21/2018.  [facility] assumed care on 11/21/2018.  11/23/2018: Patient seen and examined at bedside. No acute events overnight.  No new complaints.  He denies pain. Does not appear to be in distress.  2018-11-28: Overnight patient's respiratory status declined requiring nonrebreather to maintain O2 saturation above 90%.  Family contacted and family made decision to make patient comfort care only.  All means of treatment to prolong life have been stopped at family request.    Patient seen and examined this morning with his son and daughter-in-law at bedside.  Patient has 3 sons and a wife.  Patient son confirms comfort measures only status and states he will contact the rest of his family to update them on poor prognosis and possible in-hospital death.   Assessment/Plan: Active Problems:   Respiratory failure (HCC)  Acute hypoxic respiratory failure suspect secondary to flash pulmonary edema from NSTEMI vs aspiration pneumonia Presented with flash pulmonary edema requiring mechanical ventilation and extubated on 11/20/2018  Independently reviewed chest x-ray done on 11/20/2018 which revealed much improved aeration from bilateral pleural edema seen on prior chest x-ray. Possibly aspiration with mild aspiration risk and elevated procalcitonin and leukocytosis Was on Unasyn empirically Stopped treatment on Nov 28, 2018 per family request.  Now comfort care only per family request.    SIRS with no clear source  of infection Persistent leukocytosis and elevated procalcitonin Unasyn empirically to cover aspiration; on dysphagia 2 diet Elevated procalcitonin 1.36 and persistent leukocytosis 14,000 Mild aspiration risk on dysphagia diet Comfort care only per family request  AKI, suspect prerenal at this time from dehydration Baseline creatinine normal Creatinine improving 1.6 from 1.8 on 11/22/2018 Was on IV fluid normal saline at 75 cc Comfort care only per family request  Dysphagia On dysphagia 2 diet; mild risk Aspiration precautions  Was receiving feeding assistance Comfort care only per family request   NSTEMI Troponin peaked at 1.23 and trended down. Has history of CAD s/p CABG in 2000.  Last echo in October of 2019 with normal systolic function EF 34-19% No further work-up planned by cardiology Comfort care only per family request  Resolved hypokalemia post repletion  Uncontrolled HTN Comfort care only per family request  BPH with urinary retention: -- Foley catheter in place - C/w foley at family request- possible transition to hospice on Monday if no improvements despite treatment -- Finasteride  Comfort care only per family request  Hx CVA: with residual dysphagia  --Aspirin and Lipitor Comfort care only per family request  Ambulatory dysfunction PT recommended SNF Fall precautions  Comfort care per family request  Comfort care only Started IV morphine per family request for pain and air hunger Ativan pushes as needed for agitation Scopolamine patch for secretion Palliative care following Possible in-hospital death versus residential hospice    Best practice:   Code Status:DNR/comfort care only Family Communication:Son and daughter-in-law at bedside Disposition: Possible in hospital death versus residential hospice     Objective: Vitals:   11/23/18 0500 11/23/18  0755 11/23/18 1600 11/23/18 2347  BP:  (!) 164/56 (!) 158/61 (!) 147/62  Pulse:  61  63 (!) 54  Resp:  10 12 20   Temp:  (!) 97.5 F (36.4 C) 98.1 F (36.7 C) (!) 97.4 F (36.3 C)  TempSrc:  Oral Oral Oral  SpO2:  92% (!) 87% (!) 78%  Weight: 61.8 kg     Height:        Intake/Output Summary (Last 24 hours) at 12/02/2018 1233 Last data filed at 11/23/2018 1823 Gross per 24 hour  Intake 414.76 ml  Output 410 ml  Net 4.76 ml   Filed Weights   11/23/2018 0745 11/20/18 0500 11/23/18 0500  Weight: 77.1 kg 61.4 kg 61.8 kg    Exam:  . General: 85 y.o. year-old male frail-appearing with shallow breath, audible rhonchorous sounds, lethargic.  . Cardiovascular: Regular rate and rhythm with no rubs or gallops. [name] Kitchen Respiratory: Audible rhonchorous sounds.  Shallow breath. . Abdomen: Soft nontender nondistended with hypoactive bowel sounds.     Data Reviewed: CBC: Recent Labs  Lab 11/27/2018 0444 11/20/18 0242 11/21/18 0235 11/22/18 0738 11/23/18 0321  WBC 18.3* 14.1* 18.7* 14.3* 19.2*  NEUTROABS 13.2*  --   --   --   --   HGB 10.9* 9.7* 10.2* 10.3* 10.0*  HCT 36.0* 30.9* 31.9* 32.2* 31.5*  MCV 95.7 89.3 89.6 90.2 88.5  PLT 316 243 278 287 595   Basic Metabolic Panel: Recent Labs  Lab 11/14/2018 0955 11/28/2018 1322 11/20/18 0242 11/21/18 0235 11/22/18 0738 11/23/18 0321  NA  --  138 139 139 135 135  K  --  5.0 3.9 4.1 2.8* 3.8  CL  --  105 103 103 99 101  CO2  --  21* 18* 17* 21* 21*  GLUCOSE  --  122* 102* 93 91 128*  BUN  --  48* 51* 61* 57* 57*  CREATININE  --  1.88* 2.07* 2.10* 1.81* 1.65*  CALCIUM  --  8.6* 8.8* 8.7* 8.6* 8.3*  MG 1.8  --  1.8 2.0  --  2.5*  PHOS 3.7  --  4.1 6.1*  --   --    GFR: Estimated Creatinine Clearance: 27.1 mL/min (A) (by C-G formula based on SCr of 1.65 mg/dL (H)). Liver Function Tests: Recent Labs  Lab 11/09/2018 0444  AST 17  ALT 7  ALKPHOS 48  BILITOT 0.7  PROT 6.2*  ALBUMIN 2.6*   No results for input(s): LIPASE, AMYLASE in the last 168 hours. No results for input(s): AMMONIA in the last 168  hours. Coagulation Profile: No results for input(s): INR, PROTIME in the last 168 hours. Cardiac Enzymes: Recent Labs  Lab 11/20/2018 0955 11/23/2018 1322 11/23/2018 2028 11/20/18 0716  TROPONINI 0.62* 1.08* 1.23* 0.68*   BNP (last 3 results) No results for input(s): PROBNP in the last 8760 hours. HbA1C: No results for input(s): HGBA1C in the last 72 hours. CBG: Recent Labs  Lab 11/22/18 0746  GLUCAP 83   Lipid Profile: No results for input(s): CHOL, HDL, LDLCALC, TRIG, CHOLHDL, LDLDIRECT in the last 72 hours. Thyroid Function Tests: No results for input(s): TSH, T4TOTAL, FREET4, T3FREE, THYROIDAB in the last 72 hours. Anemia Panel: No results for input(s): VITAMINB12, FOLATE, FERRITIN, TIBC, IRON, RETICCTPCT in the last 72 hours. Urine analysis:    Component Value Date/Time   COLORURINE YELLOW 08/14/2018 1904   APPEARANCEUR CLEAR 08/14/2018 1904   LABSPEC 1.029 08/14/2018 1904   PHURINE 6.0 08/14/2018 1904   GLUCOSEU NEGATIVE  08/14/2018 1904   HGBUR NEGATIVE 08/14/2018 1904   HGBUR small 07/04/2010 [place] 08/14/2018 1904   KETONESUR 5 (A) 08/14/2018 1904   PROTEINUR NEGATIVE 08/14/2018 1904   UROBILINOGEN 0.2 08/20/2014 1056   NITRITE NEGATIVE 08/14/2018 1904   LEUKOCYTESUR NEGATIVE 08/14/2018 1904   Sepsis Labs: @LABRCNTIP (procalcitonin:4,lacticidven:4)  ) Recent Results (from the past 240 hour(s))  MRSA PCR Screening     Status: None   Collection Time: 11/14/2018  8:56 AM  Result Value Ref Range Status   MRSA by PCR NEGATIVE NEGATIVE Final    Comment:        The GeneXpert MRSA Assay (FDA approved for NASAL specimens only), is one component of a comprehensive MRSA colonization surveillance program. It is not intended to diagnose MRSA infection nor to guide or monitor treatment for MRSA infections. Performed at [facility], [place] [address], [place], [place] [address]   Culture, blood (Routine X 2) w Reflex to ID Panel     Status:  None (Preliminary result)   Collection Time: 11/27/2018  9:55 AM  Result Value Ref Range Status   Specimen Description BLOOD LEFT ANTECUBITAL  Final   Special Requests   Final    BOTTLES DRAWN AEROBIC AND ANAEROBIC Blood Culture adequate volume   Culture   Final    NO GROWTH 4 DAYS Performed at [facility], [place] [address], [place], [place] [address]    Report Status PENDING  Incomplete  Culture, blood (Routine X 2) w Reflex to ID Panel     Status: None (Preliminary result)   Collection Time: 11/27/2018 10:00 AM  Result Value Ref Range Status   Specimen Description BLOOD RIGHT HAND  Final   Special Requests   Final    BOTTLES DRAWN AEROBIC ONLY Blood Culture adequate volume   Culture   Final    NO GROWTH 4 DAYS Performed at [facility], [place] [address], [place], [place] [address]    Report Status PENDING  Incomplete  Culture, respiratory (non-expectorated)     Status: None   Collection Time: 11/11/2018 10:11 AM  Result Value Ref Range Status   Specimen Description TRACHEAL ASPIRATE  Final   Special Requests Normal  Final   Gram Stain   Final    FEW WBC PRESENT,BOTH PMN AND MONONUCLEAR MODERATE GRAM POSITIVE COCCI IN PAIRS IN CHAINS MODERATE GRAM VARIABLE ROD    Culture   Final    FEW Consistent with normal respiratory flora. Performed at [facility], [place] [address], [place], [place] [address]    Report Status 11/21/2018 FINAL  Final      Studies: No results found.  Scheduled Meds: . scopolamine  1 patch Transdermal Q72H    Continuous Infusions: . morphine 1 mg/hr (12-24-2018 0825)     LOS: 5 days     Kayleen Memos, MD [facility] Pager [phone]  If 7PM-7AM, please contact night-coverage [email or web address] Password [facility] Dec 24, 2018, 12:33 PM

## 2018-12-07 DEATH — deceased

## 2019-01-14 ENCOUNTER — Ambulatory Visit: Payer: Medicare HMO | Admitting: Family Medicine

## 2019-03-24 ENCOUNTER — Ambulatory Visit: Payer: Medicare HMO | Admitting: *Deleted

## 2020-01-14 IMAGING — MR MR THORACIC SPINE W/O CM
5 of 8 series · 21 of 48 positions shown · non-contrast
Comparison: Thoracic spine MRI 09/22/2017

CT abdomen pelvis 09/14/2017

CLINICAL DATA: Back pain.  History of compression fracture.

EXAM:
MRI THORACIC AND LUMBAR SPINE WITHOUT CONTRAST
TECHNIQUE: Multiplanar and multiecho pulse sequences of the thoracic and lumbar
spine were obtained without intravenous contrast.

[Series 5: T1 · sagittal · 3.0mm · 0.94mm/px · 2 of 9 slices shown (1 of 2)]
[im 1/9]
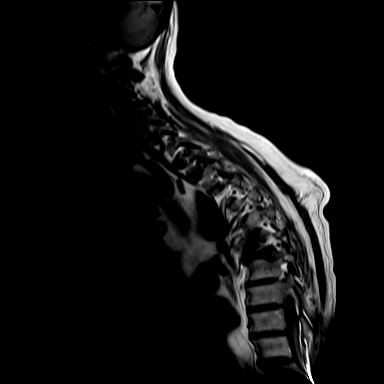
[im 9/9]
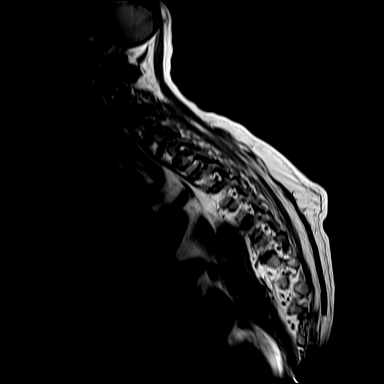

[Series 6: T2 · sagittal · 4.0mm · 0.83mm/px · 4 of 14 slices shown (1 of 3)]
[im 1/14]
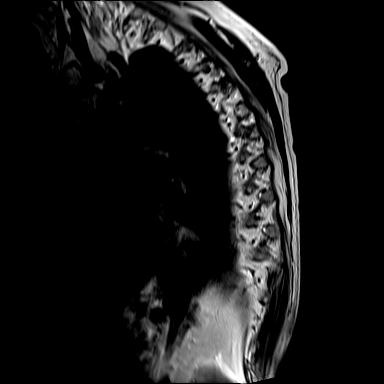
[im 5/14]
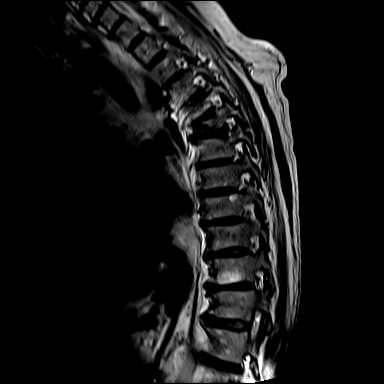
[im 9/14]
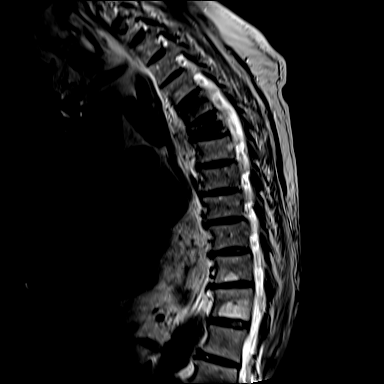
[im 14/14]
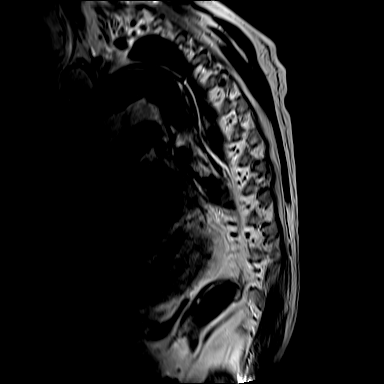

[Series 7: T1 · sagittal · 4.0mm · 0.83mm/px · 1 of 14 slices shown (2 of 2)]
[im 1/14]
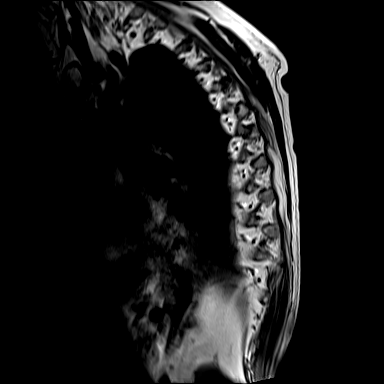

[Series 9: T2 · axial · 4.0mm · 0.39mm/px · z∈[-124,-17]mm · 8 of 31 slices shown (2 of 3)]
[im 1/31]
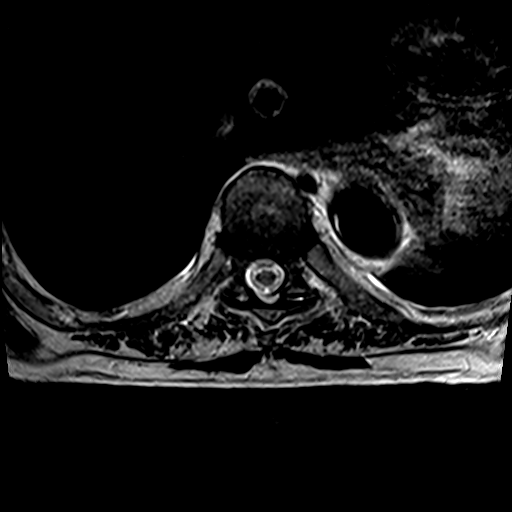
[im 4/31]
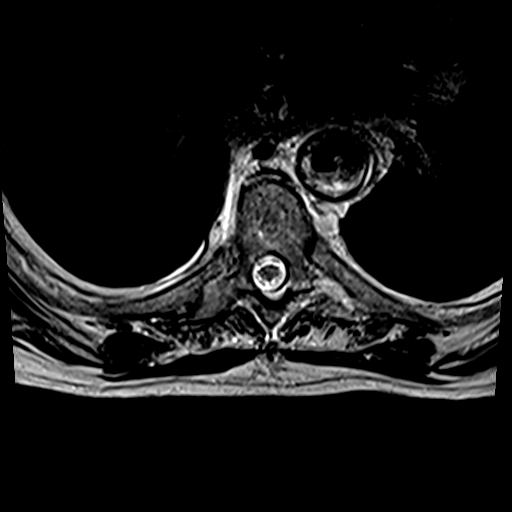
[im 11/31]
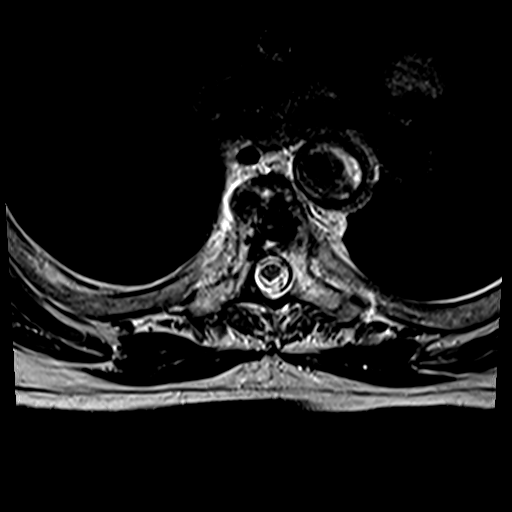
[im 14/31]
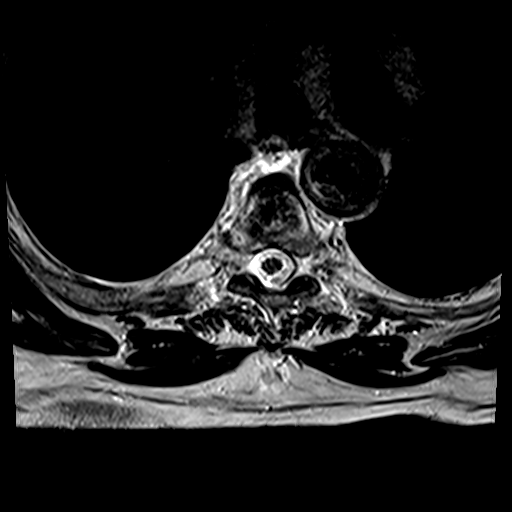
[im 17/31]
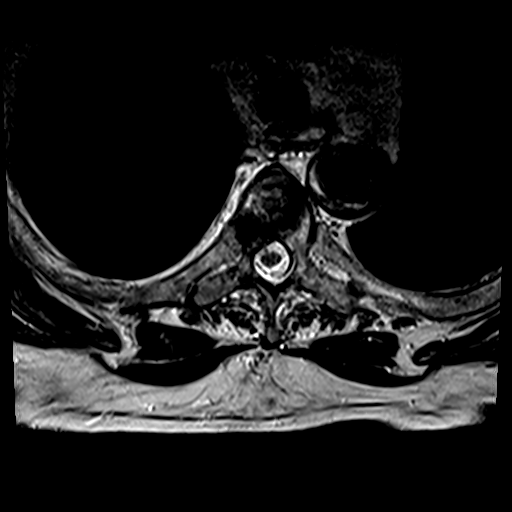
[im 21/31]
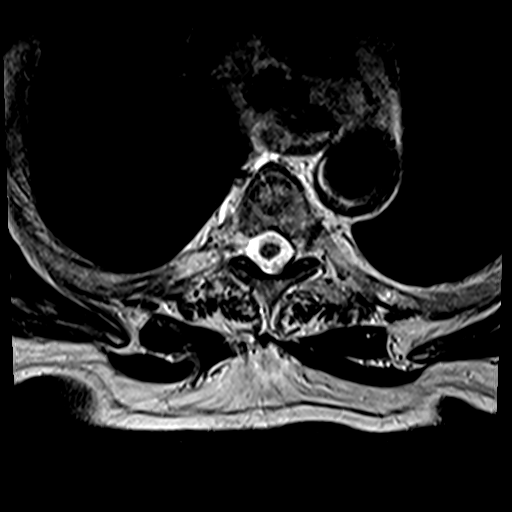
[im 27/31]
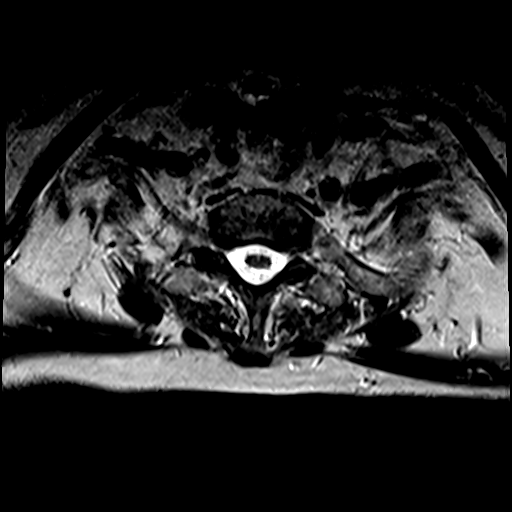
[im 31/31]
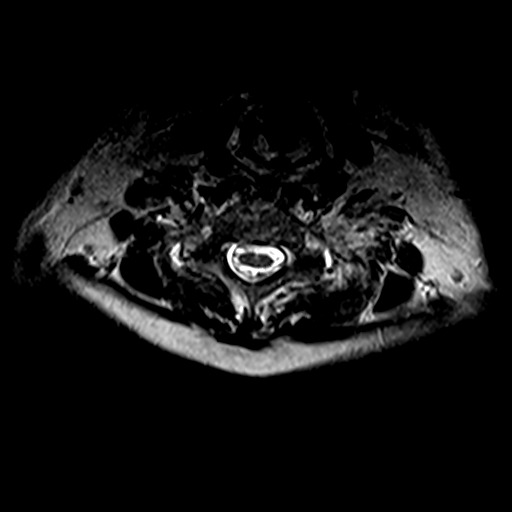

[Series 10: T2 · axial · 4.0mm · 0.39mm/px · z∈[-241,-111]mm · 6 of 18 slices shown (3 of 3)]
[im 1/18]
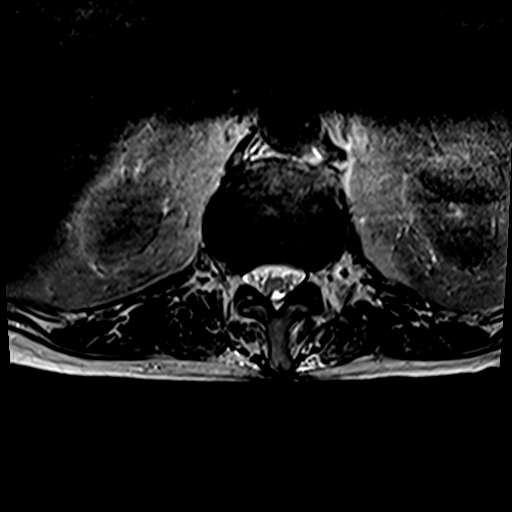
[im 4/18]
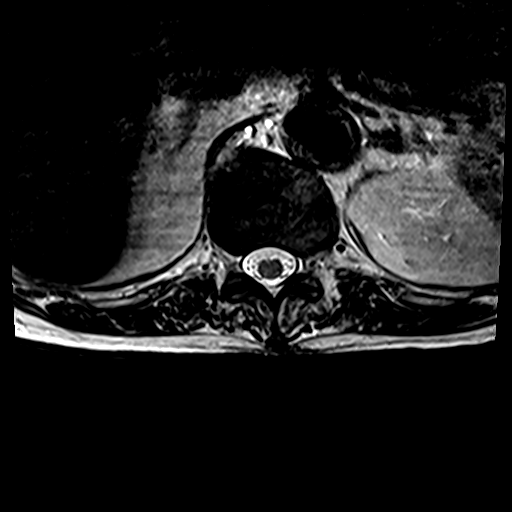
[im 7/18]
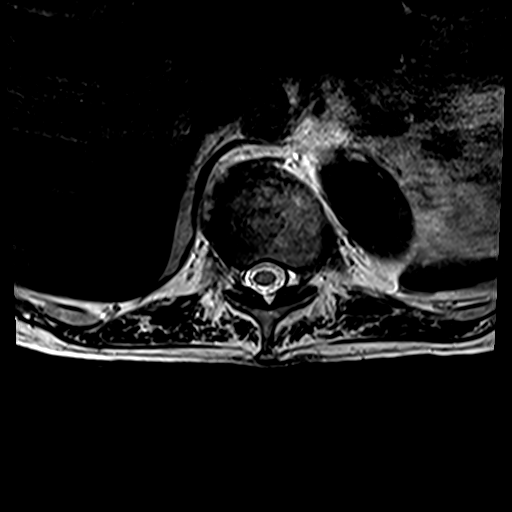
[im 11/18]
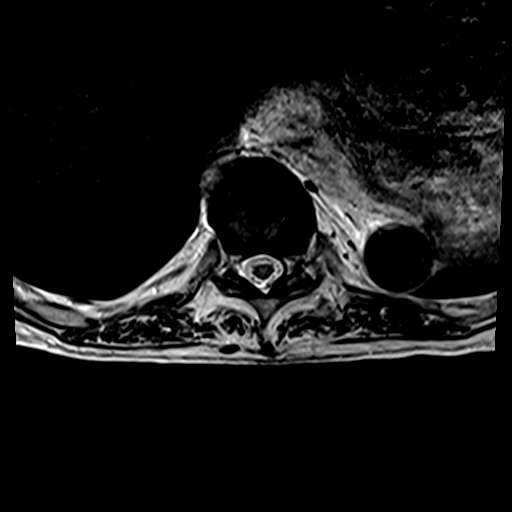
[im 14/18]
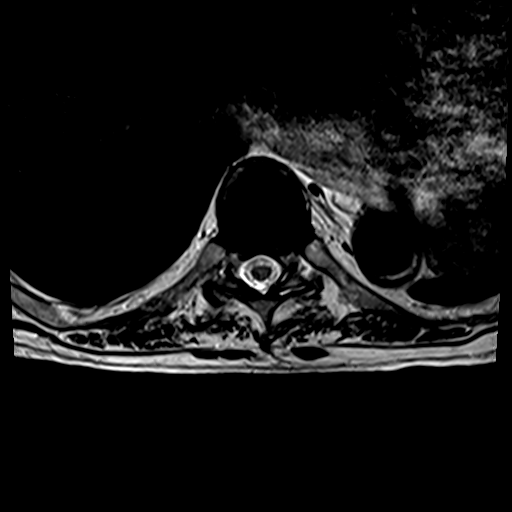
[im 18/18]
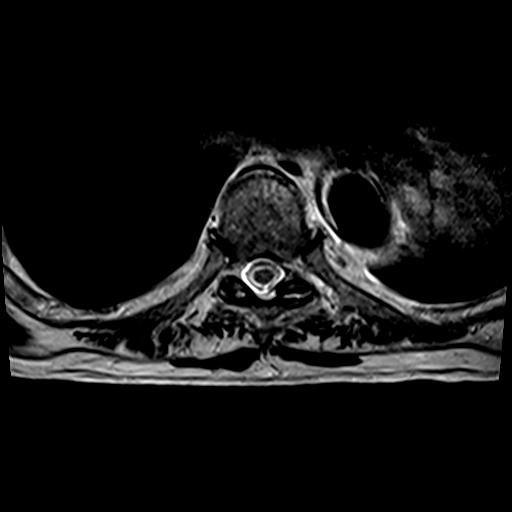

[21 of 48 positions shown; findings below may reference images not displayed]

FINDINGS: MRI THORACIC SPINE FINDINGS

Alignment: Alignment is normal. Persistent increased kyphosis of the
upper thoracic spine at the T5-6 level.

Vertebrae: Chronic compression fracture of T6 with slight
progression of height loss. There is fracture of the superior
endplate of T5, at the site of the previous Schmorl's node. There is
approximately 25% height loss. Moderate bone marrow edema.

Cord:  Normal

Paraspinal and other soft tissues: There is extensive luminal
irregularity throughout the thoracic aorta with marked luminal
narrowing of approximately 70%, likely due to atherosclerotic
plaque.

Disc levels:

No spinal canal stenosis.

MRI LUMBAR SPINE FINDINGS

Segmentation:  Standard.

Alignment:  Physiologic.

Vertebrae:  No fracture, evidence of discitis, or bone lesion.

Conus medullaris and cauda equina: Conus extends to the L1 level.
Cauda equina nerve roots are buckled above the L3-4 level.

Paraspinal and other soft tissues: Negative.

Disc levels:

The axial images are severely motion degraded.

L1-L2: Small disc bulge without stenosis.

L2-L3: Mild disc bulge with mild spinal canal narrowing.

L3-L4: Medium-sized disc bulge and moderate facet hypertrophy with
severe spinal canal stenosis and severe bilateral foraminal
stenosis. There is buckling of the cauda equina nerve roots above
this level.

L4-L5: Moderate facet hypertrophy and small disc bulge. No spinal
canal stenosis. Moderate bilateral foraminal stenosis.

L5-S1: No stenosis.
IMPRESSION: MR THORACIC SPINE IMPRESSION

1. Superior endplate compression fracture of T5, new since
09/22/2017, with approximately 25% height loss. No retropulsion or
stenosis. Moderate edema.
2. Mild progression height loss of chronic T6 compression fracture.
3. Extensive luminal irregularity throughout the thoracic aorta,
likely due to heavy burden of atherosclerotic plaque. Though
incompletely assessed on this study, there appears to be
approximately 70% narrowing of the inferior thoracic aortic lumen.

MR LUMBAR SPINE IMPRESSION

1. No acute abnormality of the lumbar spine.
2. Severe L3-L4 spinal canal stenosis and bilateral severe neural
foraminal stenosis due to combination of disc bulge and facet
arthropathy.
3. Moderate bilateral L4-5 neural foraminal stenosis.
